# Patient Record
Sex: Female | Born: 1940
Health system: Southern US, Community
[De-identification: ages and names within clinical notes are randomized; demographics above are authoritative.]

## PROBLEM LIST (undated history)

## (undated) DIAGNOSIS — I87009 Postthrombotic syndrome without complications of unspecified extremity: Secondary | ICD-10-CM

## (undated) DIAGNOSIS — M75120 Complete rotator cuff tear or rupture of unspecified shoulder, not specified as traumatic: Secondary | ICD-10-CM

## (undated) DIAGNOSIS — I82409 Acute embolism and thrombosis of unspecified deep veins of unspecified lower extremity: Secondary | ICD-10-CM

## (undated) DIAGNOSIS — Z86718 Personal history of other venous thrombosis and embolism: Secondary | ICD-10-CM

## (undated) DIAGNOSIS — I447 Left bundle-branch block, unspecified: Secondary | ICD-10-CM

## (undated) DIAGNOSIS — M1990 Unspecified osteoarthritis, unspecified site: Secondary | ICD-10-CM

## (undated) DIAGNOSIS — I2510 Atherosclerotic heart disease of native coronary artery without angina pectoris: Secondary | ICD-10-CM

## (undated) DIAGNOSIS — I739 Peripheral vascular disease, unspecified: Secondary | ICD-10-CM

## (undated) DIAGNOSIS — I454 Nonspecific intraventricular block: Secondary | ICD-10-CM

## (undated) DIAGNOSIS — H3530 Unspecified macular degeneration: Secondary | ICD-10-CM

## (undated) DIAGNOSIS — E039 Hypothyroidism, unspecified: Secondary | ICD-10-CM

## (undated) DIAGNOSIS — I8390 Asymptomatic varicose veins of unspecified lower extremity: Secondary | ICD-10-CM

## (undated) DIAGNOSIS — I10 Essential (primary) hypertension: Secondary | ICD-10-CM

## (undated) DIAGNOSIS — J189 Pneumonia, unspecified organism: Secondary | ICD-10-CM

## (undated) DIAGNOSIS — Z8249 Family history of ischemic heart disease and other diseases of the circulatory system: Secondary | ICD-10-CM

## (undated) HISTORY — DX: Family history of ischemic heart disease and other diseases of the circulatory system: Z82.49

## (undated) HISTORY — DX: Asymptomatic varicose veins of unspecified lower extremity: I83.90

## (undated) HISTORY — DX: Acute embolism and thrombosis of unspecified deep veins of unspecified lower extremity: I82.409

## (undated) HISTORY — DX: Peripheral vascular disease, unspecified: I73.9

## (undated) HISTORY — DX: Atherosclerotic heart disease of native coronary artery without angina pectoris: I25.10

## (undated) HISTORY — DX: Unspecified macular degeneration: H35.30

## (undated) HISTORY — DX: Personal history of other venous thrombosis and embolism: Z86.718

## (undated) HISTORY — DX: Nonspecific intraventricular block: I45.4

## (undated) HISTORY — PX: BACK SURGERY: SHX140

---
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
## 1977-05-29 HISTORY — PX: PARTIAL HYSTERECTOMY: SHX80

## 1977-05-29 HISTORY — PX: ABDOMINAL HYSTERECTOMY: SHX81

## 1977-05-29 HISTORY — PX: APPENDECTOMY: SHX54

## 2002-01-03 ENCOUNTER — Encounter: Admission: RE | Admit: 2002-01-03 | Discharge: 2002-01-03 | Payer: Self-pay | Admitting: Family Medicine

## 2002-01-03 ENCOUNTER — Encounter: Payer: Self-pay | Admitting: Family Medicine

## 2003-12-04 ENCOUNTER — Encounter: Admission: RE | Admit: 2003-12-04 | Discharge: 2003-12-04 | Payer: Self-pay | Admitting: Family Medicine

## 2004-03-11 ENCOUNTER — Encounter: Admission: RE | Admit: 2004-03-11 | Discharge: 2004-03-11 | Payer: Self-pay | Admitting: Family Medicine

## 2005-09-14 ENCOUNTER — Encounter: Admission: RE | Admit: 2005-09-14 | Discharge: 2005-09-14 | Payer: Self-pay | Admitting: Family Medicine

## 2006-02-06 ENCOUNTER — Encounter: Admission: RE | Admit: 2006-02-06 | Discharge: 2006-02-06 | Payer: Self-pay | Admitting: Specialist

## 2006-12-14 ENCOUNTER — Encounter: Admission: RE | Admit: 2006-12-14 | Discharge: 2006-12-14 | Payer: Self-pay | Admitting: Family Medicine

## 2007-05-30 HISTORY — PX: BACK SURGERY: SHX140

## 2007-10-11 ENCOUNTER — Encounter: Admission: RE | Admit: 2007-10-11 | Discharge: 2007-10-11 | Payer: Self-pay | Admitting: Neurological Surgery

## 2007-12-16 ENCOUNTER — Encounter: Admission: RE | Admit: 2007-12-16 | Discharge: 2007-12-16 | Payer: Self-pay | Admitting: Neurological Surgery

## 2008-01-13 ENCOUNTER — Encounter: Admission: RE | Admit: 2008-01-13 | Discharge: 2008-01-13 | Payer: Self-pay | Admitting: Family Medicine

## 2008-01-16 ENCOUNTER — Inpatient Hospital Stay (HOSPITAL_COMMUNITY): Admission: RE | Admit: 2008-01-16 | Discharge: 2008-01-19 | Payer: Self-pay | Admitting: Neurological Surgery

## 2008-01-16 HISTORY — PX: OTHER SURGICAL HISTORY: SHX169

## 2008-02-18 ENCOUNTER — Encounter: Admission: RE | Admit: 2008-02-18 | Discharge: 2008-02-18 | Payer: Self-pay | Admitting: Neurological Surgery

## 2008-02-20 ENCOUNTER — Encounter: Admission: RE | Admit: 2008-02-20 | Discharge: 2008-03-18 | Payer: Self-pay | Admitting: Neurological Surgery

## 2008-04-20 ENCOUNTER — Encounter: Admission: RE | Admit: 2008-04-20 | Discharge: 2008-04-20 | Payer: Self-pay | Admitting: Neurological Surgery

## 2008-05-29 HISTORY — PX: OTHER SURGICAL HISTORY: SHX169

## 2008-06-29 ENCOUNTER — Encounter: Admission: RE | Admit: 2008-06-29 | Discharge: 2008-06-29 | Payer: Self-pay | Admitting: Neurological Surgery

## 2009-01-12 ENCOUNTER — Encounter: Admission: RE | Admit: 2009-01-12 | Discharge: 2009-01-12 | Payer: Self-pay | Admitting: Neurological Surgery

## 2009-01-26 ENCOUNTER — Encounter: Admission: RE | Admit: 2009-01-26 | Discharge: 2009-01-26 | Payer: Self-pay | Admitting: Family Medicine

## 2009-06-22 ENCOUNTER — Encounter: Admission: RE | Admit: 2009-06-22 | Discharge: 2009-06-22 | Payer: Self-pay | Admitting: Neurological Surgery

## 2009-07-16 ENCOUNTER — Inpatient Hospital Stay (HOSPITAL_COMMUNITY): Admission: RE | Admit: 2009-07-16 | Discharge: 2009-07-18 | Payer: Self-pay | Admitting: Neurological Surgery

## 2009-08-17 ENCOUNTER — Encounter: Admission: RE | Admit: 2009-08-17 | Discharge: 2009-08-17 | Payer: Self-pay | Admitting: Neurological Surgery

## 2009-09-14 ENCOUNTER — Encounter: Admission: RE | Admit: 2009-09-14 | Discharge: 2009-09-14 | Payer: Self-pay | Admitting: Neurological Surgery

## 2009-12-13 ENCOUNTER — Encounter: Admission: RE | Admit: 2009-12-13 | Discharge: 2009-12-13 | Payer: Self-pay | Admitting: Neurological Surgery

## 2010-03-08 ENCOUNTER — Encounter: Admission: RE | Admit: 2010-03-08 | Discharge: 2010-03-08 | Payer: Self-pay | Admitting: Family Medicine

## 2010-05-28 IMAGING — CR DG CHEST 2V
2 series · 2 of 2 positions shown · non-contrast
Comparison: None

CLINICAL DATA: Preoperative respiratory exam for spine surgery.
Hypertension.

CHEST - 2 VIEW

[view not recorded (1 of 2)]
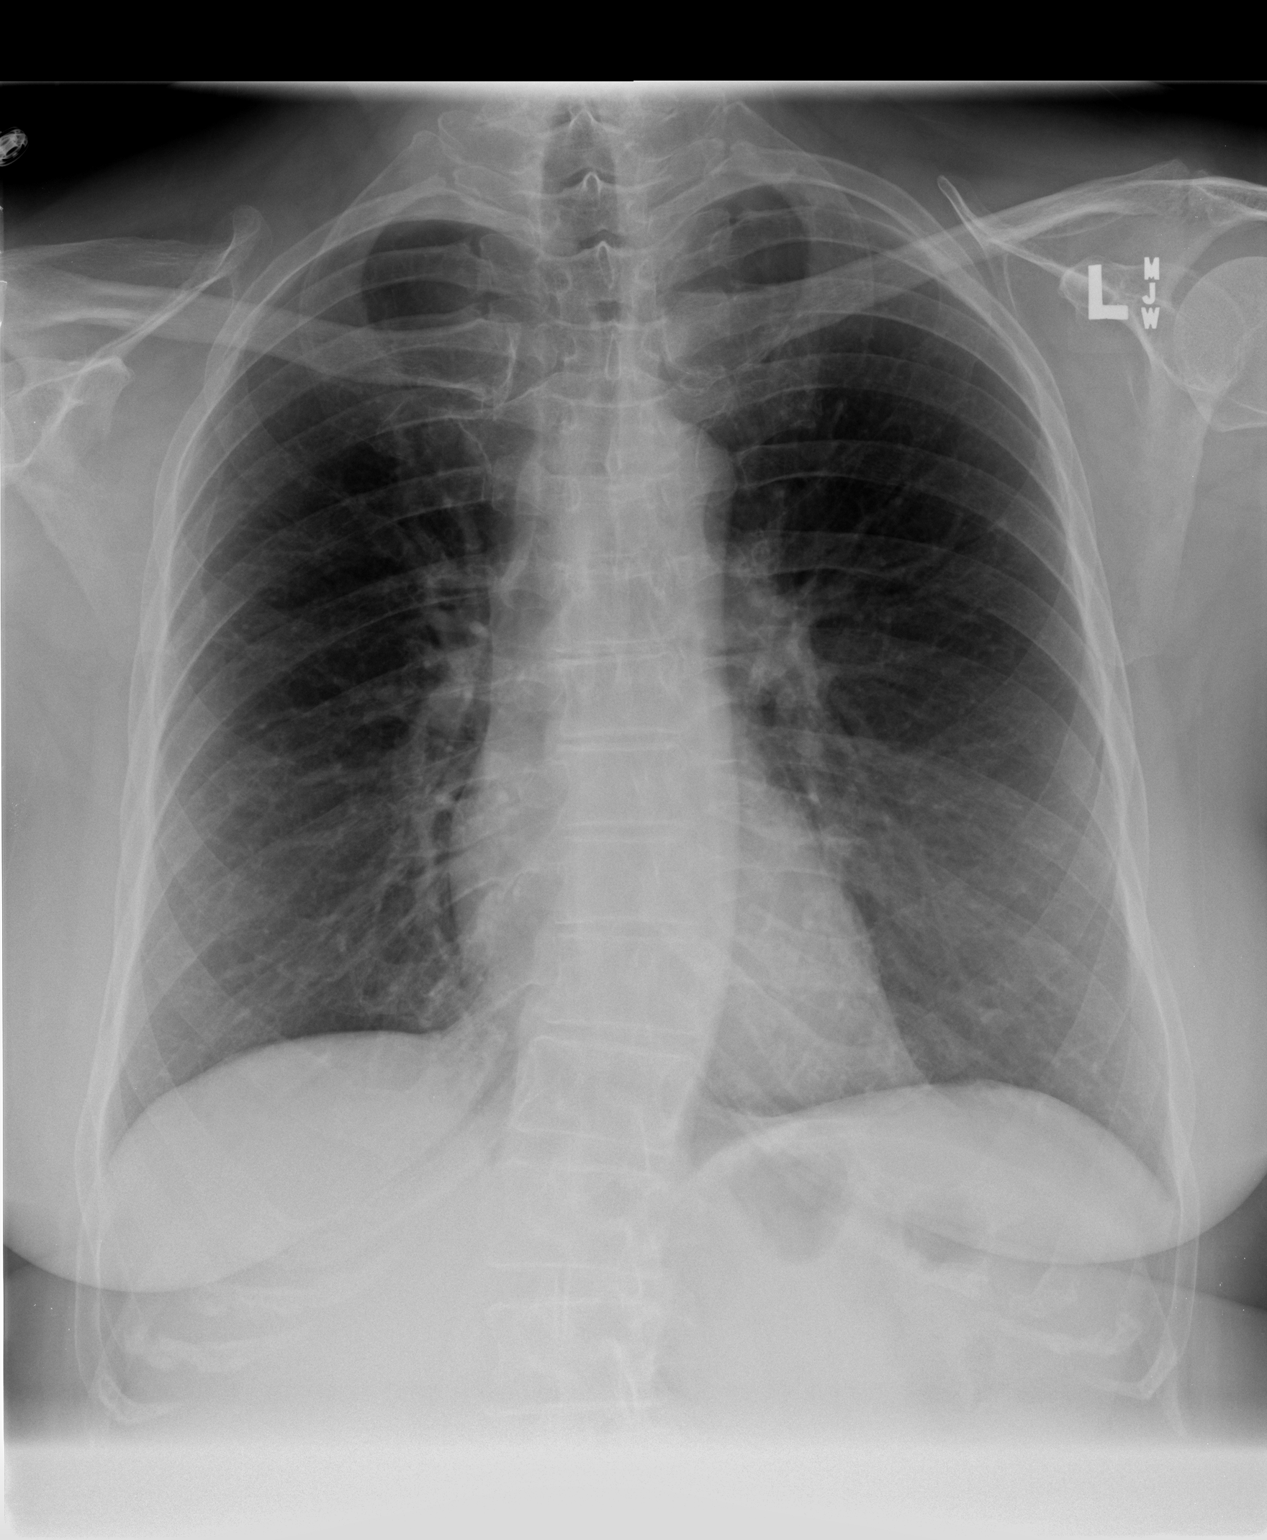

[view not recorded (2 of 2)]
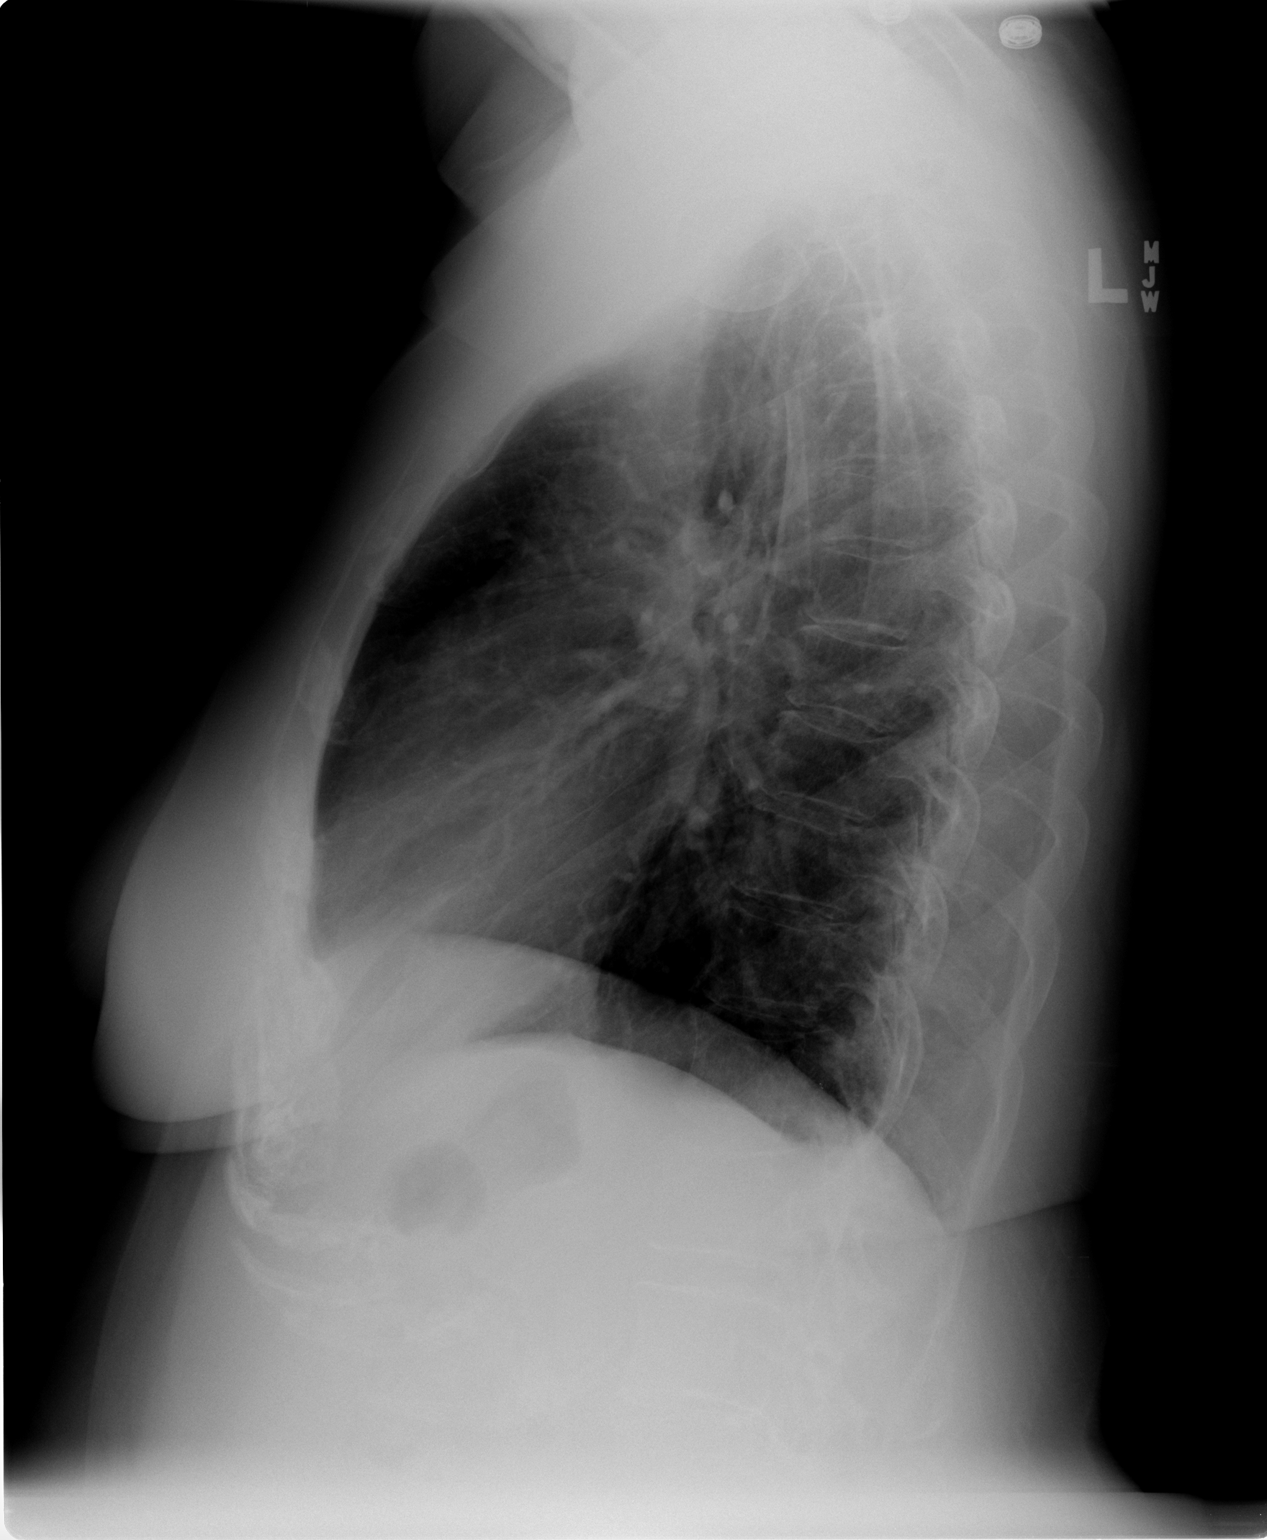

[2 of 2 positions shown; findings below may reference images not displayed]

FINDINGS: Heart size is normal.  The mediastinum is unremarkable.
Lungs are clear.  No effusions.  Mild scoliosis of the spine.
IMPRESSION: No active disease

## 2010-06-07 ENCOUNTER — Encounter
Admission: RE | Admit: 2010-06-07 | Discharge: 2010-06-07 | Payer: Self-pay | Source: Home / Self Care | Attending: Neurological Surgery | Admitting: Neurological Surgery

## 2010-08-17 LAB — CBC
HCT: 40.6 % (ref 36.0–46.0)
Hemoglobin: 14.3 g/dL (ref 12.0–15.0)
MCHC: 35.2 g/dL (ref 30.0–36.0)
MCV: 94.4 fL (ref 78.0–100.0)
Platelets: 213 10*3/uL (ref 150–400)
RBC: 4.3 MIL/uL (ref 3.87–5.11)
RDW: 12.6 % (ref 11.5–15.5)
WBC: 8.2 10*3/uL (ref 4.0–10.5)

## 2010-08-17 LAB — DIFFERENTIAL
Basophils Absolute: 0 10*3/uL (ref 0.0–0.1)
Basophils Relative: 0 % (ref 0–1)
Eosinophils Absolute: 0.1 10*3/uL (ref 0.0–0.7)
Eosinophils Relative: 1 % (ref 0–5)
Lymphocytes Relative: 27 % (ref 12–46)
Lymphs Abs: 2.2 10*3/uL (ref 0.7–4.0)
Monocytes Absolute: 0.8 10*3/uL (ref 0.1–1.0)
Monocytes Relative: 9 % (ref 3–12)
Neutro Abs: 5.1 10*3/uL (ref 1.7–7.7)
Neutrophils Relative %: 63 % (ref 43–77)

## 2010-08-17 LAB — BASIC METABOLIC PANEL
BUN: 12 mg/dL (ref 6–23)
CO2: 30 mEq/L (ref 19–32)
Calcium: 9.7 mg/dL (ref 8.4–10.5)
Chloride: 93 mEq/L — ABNORMAL LOW (ref 96–112)
Creatinine, Ser: 0.63 mg/dL (ref 0.4–1.2)
GFR calc Af Amer: >60 mL/min (ref >60)
GFR calc non Af Amer: >60 mL/min (ref >60)
Glucose, Bld: 86 mg/dL (ref 70–99)
Potassium: 4.1 mEq/L (ref 3.5–5.1)
Sodium: 131 mEq/L — ABNORMAL LOW (ref 135–145)

## 2010-08-17 LAB — PROTIME-INR
INR: 1.13 (ref 0.00–1.49)
Prothrombin Time: 14.4 seconds (ref 11.6–15.2)

## 2010-08-17 LAB — APTT: aPTT: 35 seconds (ref 24–37)

## 2010-08-17 LAB — TYPE AND SCREEN
ABO/RH(D): O POS
Antibody Screen: NEGATIVE

## 2010-08-17 LAB — SURGICAL PCR SCREEN
MRSA, PCR: NEGATIVE
Staphylococcus aureus: NEGATIVE

## 2010-10-11 NOTE — Discharge Summary (Signed)
[name], [name] NO.:  [id number]   MEDICAL RECORD NO.:  [id number]          PATIENT TYPE:  INP   LOCATION:  3027                         FACILITY:  [facility]   PHYSICIAN:  Tia Alert, MD     DATE OF BIRTH:  03-01-1941   DATE OF ADMISSION:  01/16/2008  DATE OF DISCHARGE:  01/19/2008                               DISCHARGE SUMMARY   ADMITTING DIAGNOSES:  Lumbar spinal stenosis with degenerative disease  and lumbar instability, L3-L4, L4-L5 with back and leg pain.   PROCEDURE:  Posterior lumbar interbody fusion, L3-L4, L4-L5.   HISTORY OF PRESENT ILLNESS:  Amber [name] is a 70 year old female who  presented with severe leg pain, especially in the right leg.  She had an  MRI, which showed segmental instability at L3-L4 and L4-L5 with facet  arthropathy and degenerative disease and spinal stenosis and recommended  two-level lumbar fusion at L3-L4 and L4-L5.  She understood the risks,  benefits, expected outcome, and wished to proceed.   HOSPITAL COURSE:  The patient was admitted on January 18, 2008 and taken  to the operating room where she underwent a posterior lumbar interbody  fusion at L3-L4 and L4-L5.  The patient tolerated the procedure well and  was taken to the recovery room and then to the floor in stable  condition.  For details of the operative procedure, please see the  dictated operative note.  The patient's hospital course was routine.  There were no complications.  She remained at bedrest the first night  with a Foley catheter in place and PCA for pain control.  She the next  morning was allowed out of bed.  Her Foley catheter was discontinued.  Her pain became well-controlled on oral pain medications and her PCA was  discontinued.  Her incision remained clean, dry, and intact.  She  remained afebrile with stable vital signs.  She was ambulating without  difficulty with a walker.  Physical and Occupational Therapy worked with  the patient.  She made  nice strides with them.  She was discharged home  in stable condition on January 19, 2008 with plans to follow up in a week  and half with me.   FINAL DIAGNOSIS:  Posterior lumbar interbody fusion, L3-L4, L4-L5.      Tia Alert, MD  Electronically Signed     DSJ/MEDQ  D:  01/19/2008  T:  01/19/2008  Job:  [id number]

## 2010-10-11 NOTE — Op Note (Signed)
Amber [name], Amber [name] NO.:  [id number]   MEDICAL RECORD NO.:  [id number]          PATIENT TYPE:  INP   LOCATION:  3027                         FACILITY:  [facility]   PHYSICIAN:  Tia Alert, MD     DATE OF BIRTH:  [birth year]   DATE OF PROCEDURE:  01/16/2008  DATE OF DISCHARGE:                               OPERATIVE REPORT   PREOPERATIVE DIAGNOSES:  1. Degenerative disease L3-L4 and L4-L5.  2. Spondylolisthesis L3-L4.  3. Lumbar spinal stenosis L3-L4 and L4-L5.  4. Degenerative scoliosis.  5. Back pain.  6. Lower extremity radiculopathy.   POSTOPERATIVE DIAGNOSES:  1. Degenerative disease L3-L4 and L4-L5.  2. Spondylolisthesis L3-L4.  3. Lumbar spinal stenosis L3-L4 and L4-L5.  4. Degenerative scoliosis.  5. Back pain.  6. Lower extremity radiculopathy.   PROCEDURE:  1. Decompressive lumbar laminectomy, hemi-facetectomies, and      foraminotomies L3, L4, and L5; and decompression at the L3, L4, and      L5 nerve roots requiring more work than it has typically required      at the typical PLIF procedure.  2. Posterior lumbar interbody fusion L3-L4 and L4-L5 utilizing 8 x 22-      mm PEEK interbody cage packed with local autograft, Actifuse putty      and 8 x 22-mm tangent interbody bone wedges.  3. Intertransverse arthrodesis L3-L5 bilaterally utilizing locally      harvested morselized autologous bone graft and Actifuse putty.  4. Segmental fixation L3-L5 bilaterally utilizing the Sofamor-Danek      Legacy pedicle screw system.   SURGEON:  Tia Alert, MD   ASSISTANT:  Donalee Citrin, MD   ANESTHESIA:  General endotracheal.   COMPLICATIONS:  None apparent.   INDICATIONS FOR PROCEDURE:  Ms. Reber is a 70 year old female who has a  long history of back and right leg pain.  She had an MRI and then a CT  scan with a myelography, which showed significant spinal stenosis at L3-  L4 and L4-L5 with degenerative disk disease and degenerative disk  scoliosis.  She had severe back and right leg pain.  Because of her  segmental instability and scoliosis and significant stenosis, I  recommended a decompressive laminectomy followed by instrumented fusion.  She understood the risks, benefits, and expected outcome and wished to  proceed.   DESCRIPTION OF THE PROCEDURE:  The patient was taken to the operating  room.  After induction of adequate generalized endotracheal anesthesia,  she was rolled into the prone position on chest rolls and all pressure  points were padded.  Her lumbar region was prepped with DuraPrep and  then draped in the usual sterile fashion.  A 10 mL of local anesthesia  was injected, a dorsal midline incision was made and carried down to the  lumbosacral fascia.  The fascia was opened and the paraspinous  musculature was taken down in subperiosteal fashion to expose L3-L4 and  L4-L5, and intraoperative fluoroscopy confirmed my level and then the  dissection was taken out over the facets to expose the transverse  processes of  L3, L4, and L5.  I then removed the spinous processes and  the lamina with a combination of the Leksell rongeur and the Kerrison  punch of the underlying yellow ligament was removed in a piecemeal  fashion to expose the underlying L3, L4, and L5 nerve roots.  Hemi  facetectomies were performed and wide foraminotomies were performed  until each nerve root was skeletonized distally into the foramina.  Once  the decompression was complete, we turned our attention to the posterior  lumbar interbody fusion.  She had significant scoliosis and was  collapsed more on her right side than on the left.  We distracted the  disk spaces, opened to 8 mm at L3-L4 and L4-L5 with sequential  distraction and then prepared the endplates with the rotating cutter and  the cutting chisels.  The midline was prepared with Epstein curettes.  We, at each level placed 8 x 22-mm Peak interbody cages packed with  local  autograft and Actifuse and 8 x 22-mm tangent interbody bone  wedges.  The midline was packed with local autograft and Actifuse.  The  pedicle screw entry zones were then identified utilizing surface  landmarks and lateral fluoroscopy.  We probed each pedicle with the  pedicle probe, tapped each pedicle with a 5.5 tap, palpated each pedicle  with a ball probe to assure no break in the cortex and then placed a 65  x 45-mm pedicle screws into the L3 and L5 pedicles bilaterally and 65 x  40-mm pedicle screws in the L4 pedicles bilaterally.  We then  decorticated the transverse processes and placed a mixture of local  autograft and Actifuse out over these to perform intertransverse  arthrodesis L3-L4 and L4-L5 bilaterally.  We then placed lordotic rods  into multiaxial screw heads of the pedicle screws and locked these into  position with a locking caps and anti-torque device while achieving  compression of our grafts.  I then irrigated with saline solution  containing bacitracin, dried all bleeding points, and inspected nerve  roots once again by palpating along them with coronary dilator and then  lined the dura with Gelfoam and placed a medium Hemovac drain through  separate stab incision then closed the muscle and the fascia with 0  Vicryl, closed the subcutaneous and subcuticular tissue with 2-0 and 3-0  Vicryl, closed the skin with benzoin and Steri-Strips.  The drapes were  removed and sterile dressing was applied.  The patient was awakened from  general anesthesia and transferred to the recovery room in stable  condition.  At the end of the procedure, all sponge, needle, and  instrument counts were correct.      Tia Alert, MD  Electronically Signed     DSJ/MEDQ  D:  01/16/2008  T:  01/17/2008  Job:  [phone] [id number]

## 2011-03-22 ENCOUNTER — Other Ambulatory Visit: Payer: Self-pay | Admitting: Family Medicine

## 2011-03-22 DIAGNOSIS — Z1231 Encounter for screening mammogram for malignant neoplasm of breast: Secondary | ICD-10-CM

## 2011-04-13 ENCOUNTER — Ambulatory Visit
Admission: RE | Admit: 2011-04-13 | Discharge: 2011-04-13 | Disposition: A | Discharge status: Home / Self Care | Payer: Medicare Other | Source: Ambulatory Visit | Attending: Family Medicine | Admitting: Family Medicine

## 2011-04-13 DIAGNOSIS — Z1231 Encounter for screening mammogram for malignant neoplasm of breast: Secondary | ICD-10-CM

## 2011-05-03 ENCOUNTER — Other Ambulatory Visit: Payer: Self-pay | Admitting: Family Medicine

## 2011-05-03 DIAGNOSIS — IMO0002 Reserved for concepts with insufficient information to code with codable children: Secondary | ICD-10-CM

## 2011-05-04 ENCOUNTER — Ambulatory Visit
Admission: RE | Admit: 2011-05-04 | Discharge: 2011-05-04 | Disposition: A | Discharge status: Home / Self Care | Payer: Medicare Other | Source: Ambulatory Visit | Attending: Family Medicine | Admitting: Family Medicine

## 2011-05-04 DIAGNOSIS — IMO0002 Reserved for concepts with insufficient information to code with codable children: Secondary | ICD-10-CM

## 2011-05-04 MED ORDER — GADOBENATE DIMEGLUMINE 529 MG/ML IV SOLN
16.0000 mL | Freq: Once | INTRAVENOUS | Status: AC | PRN
  Administered 2011-05-04: 16 mL via INTRAVENOUS

## 2011-06-20 ENCOUNTER — Encounter (HOSPITAL_COMMUNITY): Payer: Self-pay | Admitting: Pharmacy Technician

## 2011-06-23 ENCOUNTER — Encounter (HOSPITAL_COMMUNITY)
Admission: RE | Admit: 2011-06-23 | Discharge: 2011-06-23 | Disposition: A | Discharge status: Home / Self Care | Payer: Medicare Other | Source: Ambulatory Visit | Attending: Orthopedic Surgery | Admitting: Orthopedic Surgery

## 2011-06-23 ENCOUNTER — Ambulatory Visit (HOSPITAL_COMMUNITY)
Admission: RE | Admit: 2011-06-23 | Discharge: 2011-06-23 | Disposition: A | Discharge status: Home / Self Care | Payer: Medicare Other | Source: Ambulatory Visit | Attending: Orthopedic Surgery | Admitting: Orthopedic Surgery

## 2011-06-23 ENCOUNTER — Encounter (HOSPITAL_COMMUNITY): Payer: Self-pay

## 2011-06-23 DIAGNOSIS — Z87891 Personal history of nicotine dependence: Secondary | ICD-10-CM | POA: Insufficient documentation

## 2011-06-23 DIAGNOSIS — Z01818 Encounter for other preprocedural examination: Secondary | ICD-10-CM | POA: Insufficient documentation

## 2011-06-23 DIAGNOSIS — Z01812 Encounter for preprocedural laboratory examination: Secondary | ICD-10-CM | POA: Insufficient documentation

## 2011-06-23 HISTORY — DX: Essential (primary) hypertension: I10

## 2011-06-23 HISTORY — DX: Unspecified osteoarthritis, unspecified site: M19.90

## 2011-06-23 HISTORY — DX: Hypothyroidism, unspecified: E03.9

## 2011-06-23 LAB — SURGICAL PCR SCREEN
MRSA, PCR: NEGATIVE
Staphylococcus aureus: NEGATIVE

## 2011-06-23 LAB — CBC
HCT: 40.5 % (ref 36.0–46.0)
Hemoglobin: 13.9 g/dL (ref 12.0–15.0)
MCH: 31.6 pg (ref 26.0–34.0)
MCHC: 34.3 g/dL (ref 30.0–36.0)
MCV: 92 fL (ref 78.0–100.0)
Platelets: 197 10*3/uL (ref 150–400)
RBC: 4.4 MIL/uL (ref 3.87–5.11)
RDW: 13.2 % (ref 11.5–15.5)
WBC: 8 10*3/uL (ref 4.0–10.5)

## 2011-06-23 LAB — COMPREHENSIVE METABOLIC PANEL
ALT: 23 U/L (ref 0–35)
AST: 25 U/L (ref 0–37)
Albumin: 3.8 g/dL (ref 3.5–5.2)
Alkaline Phosphatase: 68 U/L (ref 39–117)
BUN: 12 mg/dL (ref 6–23)
CO2: 32 mEq/L (ref 19–32)
Calcium: 9.8 mg/dL (ref 8.4–10.5)
Chloride: 97 mEq/L (ref 96–112)
Creatinine, Ser: 0.75 mg/dL (ref 0.50–1.10)
GFR calc Af Amer: >90 mL/min (ref >90)
GFR calc non Af Amer: 83 mL/min — ABNORMAL LOW (ref >90)
Glucose, Bld: 175 mg/dL — ABNORMAL HIGH (ref 70–99)
Potassium: 3.8 mEq/L (ref 3.5–5.1)
Sodium: 137 mEq/L (ref 135–145)
Total Bilirubin: 0.7 mg/dL (ref 0.3–1.2)
Total Protein: 6.9 g/dL (ref 6.0–8.3)

## 2011-06-23 LAB — URINE MICROSCOPIC-ADD ON

## 2011-06-23 LAB — URINALYSIS, ROUTINE W REFLEX MICROSCOPIC
Bilirubin Urine: NEGATIVE
Glucose, UA: NEGATIVE mg/dL
Hgb urine dipstick: NEGATIVE
Ketones, ur: NEGATIVE mg/dL
Nitrite: NEGATIVE
Protein, ur: NEGATIVE mg/dL
Specific Gravity, Urine: 1.009 (ref 1.005–1.030)
Urobilinogen, UA: 0.2 mg/dL (ref 0.0–1.0)
pH: 6.5 (ref 5.0–8.0)

## 2011-06-23 LAB — DIFFERENTIAL
Basophils Absolute: 0 10*3/uL (ref 0.0–0.1)
Basophils Relative: 0 % (ref 0–1)
Eosinophils Absolute: 0.1 10*3/uL (ref 0.0–0.7)
Eosinophils Relative: 1 % (ref 0–5)
Lymphocytes Relative: 33 % (ref 12–46)
Lymphs Abs: 2.7 10*3/uL (ref 0.7–4.0)
Monocytes Absolute: 0.4 10*3/uL (ref 0.1–1.0)
Monocytes Relative: 5 % (ref 3–12)
Neutro Abs: 4.8 10*3/uL (ref 1.7–7.7)
Neutrophils Relative %: 60 % (ref 43–77)

## 2011-06-23 LAB — PROTIME-INR
INR: 0.97 (ref 0.00–1.49)
Prothrombin Time: 13.1 seconds (ref 11.6–15.2)

## 2011-06-23 LAB — APTT: aPTT: 42 seconds — ABNORMAL HIGH (ref 24–37)

## 2011-06-23 NOTE — Patient Instructions (Signed)
[name]  06/23/2011   Your procedure is scheduled on:  Wednesday 07/05/2011 at 1120 am  Report to [facility] at 0920 AM.  Call this number if you have problems the morning of surgery: [phone]   Remember:   Do not eat food:After Midnight.  May have clear liquids:until Midnight .  Clear liquids include soda, tea, black coffee, apple or grape juice, broth.  Take these medicines the morning of surgery with A SIP OF WATER: Synthroid   Do not wear jewelry, make-up or nail polish.  Do not wear lotions, powders, or perfumes.   Do not shave 48 hours prior to surgery.(women only-shaving legs)  Do not bring valuables to the hospital.  Contacts, dentures or bridgework may not be worn into surgery.  Leave suitcase in the car. After surgery it may be brought to your room.  For patients admitted to the hospital, checkout time is 11:00 AM the day of discharge.   Patients discharged the day of surgery will not be allowed to drive home.  Name and phone number of your driver:  Special Instructions: CHG Shower Use Special Wash: 1/2 bottle night before surgery and 1/2 bottle morning of surgery.   Please read over the following fact sheets that you were given: MRSA Information

## 2011-06-23 NOTE — H&P (Signed)
Amber [name] DOB: May 03, 1941   Chief Complaint: Right shoulder pain  History of Present Illness The patient is a 71 year old female who comes in today for a preoperative History and Physical. The patient is scheduled for a right Rotator Cuff Repair to be performed by Dr. Georges Lynch. [name], MD at [facility] on Wednesday July 05, 2011 . She reports pain with overhead motions at the right shoulder and right biceps. The patient reports symptoms which include shoulder pain, shoulder stiffness, decreased range of motion and inability to lay on that side. The patient reports symptoms that radiate to the right upper arm. MRI reveals large right rotator cuff tear and ruptured long head of her biceps. PCP: Dr. Marjory Lies Back/Neurosurgeon: Dr. Marikay Alar  Past MedicalHistory Complete rotator cuff rupture, non-traumatic (727.61) Biceps tendon rupture (840.8) Degeneration, lumbar/lumbosacral disc (722.52). 11/08/2004 Lumbago (724.2). 12/21/2004 Osteoarthrosis NOS, other spec site (715.98). 09/28/2005 High blood pressure Hypothyroidism Hypercholesterolemia Herpes Zoster Varicose veins Phlebitis Hypothyroidism Blood clots. 1964 after birth of child Osteoarthritis Degenerative Disc Disease Bursitis. Right shoulder Measles. childhood Mumps. childhood Left Bundle Branch Block. ECG. stable  Allergies DARVOCET. 11/11/2004 Nausea. SULFA. 11/11/2004 Photosensitivity, Pruritus, Swelling. Nickel. Rash. Codeine/Codeine Derivatives. Vomiting.  Family History Congestive Heart Failure. mother Chronic Obstructive Lung Disease. sister Cancer. mother (esophageal), sister and brother Heart Disease. Father. mother, sister and brother Diabetes Mellitus. mother and sister Depression. sister Severe allergy. brother Liver Disease, Chronic. sister Hypertension. sister  Social History Pain Contract. no Current work status. retired Illicit drug use.  no Exercise. Exercises daily; does running / walking Drug/Alcohol Rehab (Currently). no Living situation. live with spouse Children. 2 Number of flights of stairs before winded. less than 1 Marital status. married Previously in rehab. no Tobacco / smoke exposure. no Alcohol use. never consumed alcohol Tobacco use. former smoker; smoke(d) less than 1/2 pack(s) per day Caregiver. after surgery- sister and husband  Medication History Levothyroxine Sodium ( Tablet, Oral) Active. Losartan Potassium-HCTZ (100-25MG  Tablet, Oral) Active. Simvastatin (20MG  Tablet, Oral) Active. Aspirin EC (81MG  Tablet DR, Oral daily) Active.  Past Surgical History Hysterectomy. partial (non-cancerous) 1979 Dilation and Curettage of Uterus - Multiple Spinal Decompression. lower back 2011 Spinal Surgery. Lumbar fusion L3-4, L4-5 2009 Tonsillectomy Appendectomy  Review of Systems General:Not Present- Chills, Fever, Night Sweats, Fatigue, Weight Gain, Weight Loss and Memory Loss. Skin:Not Present- Hives, Itching, Rash, Eczema and Lesions. HEENT:Not Present- Tinnitus, Headache, Double Vision, Visual Loss, Hearing Loss and Dentures. Respiratory:Not Present- Shortness of breath with exertion, Shortness of breath at rest, Allergies, Coughing up blood and Chronic Cough. Cardiovascular:Not Present- Chest Pain, Racing/skipping heartbeats, Difficulty Breathing Lying Down, Murmur, Swelling and Palpitations. Gastrointestinal:Not Present- Bloody Stool, Heartburn, Abdominal Pain, Vomiting, Nausea, Constipation, Diarrhea, Difficulty Swallowing, Jaundice and Loss of appetitie. Female Genitourinary:Present- Urinating at Night. Not Present- Blood in Urine, Urinary frequency, Weak urinary stream, Discharge, Flank Pain, Incontinence, Painful Urination, Urgency and Urinary Retention. Musculoskeletal:Present- Muscle Weakness, Joint Pain and Morning Stiffness. Not Present- Muscle Pain, Joint Swelling,  Back Pain and Spasms. Neurological:Not Present- Tremor, Dizziness, Blackout spells, Paralysis, Difficulty with balance and Weakness. Psychiatric:Not Present- Insomnia.  Vitals 06/23/2011 9:30 AM Weight: 174 lb Height: 64 in Body Surface Area: 1.89 m Body Mass Index: 29.87 kg/m Pulse: 72 (Regular) Resp.: 16 (Unlabored) BP: 128/70 (Sitting, Left Arm, Standard)  Physical Exam General Mental Status - Alert, cooperative and good historian. General Appearance- pleasant. Not in acute distress. Orientation- Oriented X3. Build & Nutrition- Well nourished and Well developed. Head and Neck Head- normocephalic,  atraumatic . NeckGlobal Assessment- supple. no bruit auscultated on the right and no bruit auscultated on the left Eye Pupil- Bilateral- PERRLA. Motion- Bilateral- EOMI. Chest and Lung Exam Auscultation: Breath sounds:- clear at anterior chest wall and - clear at posterior chest wall. Adventitious sounds:- No Adventitious sounds. Cardiovascular Auscultation:Rhythm- Regular rate and rhythm. Heart Sounds- S1 WNL and S2 WNL. Murmurs & Other Heart Sounds:Auscultation of the heart reveals - No Murmurs. Abdomen Palpation/Percussion:Tenderness- Abdomen is non-tender to palpation. Rigidity (guarding)- Abdomen is soft. Auscultation:Auscultation of the abdomen reveals - Bowel sounds normal. Female Genitourinary Not done, not pertinent to present illness Peripheral Vascular Upper Extremity: Palpation:- Pulses bilaterally normal. Lower Extremity: Palpation:- Pulses bilaterally normal. Neurologic Neurologic evaluation reveals - normal sensation and upper and lower extremity deep tendon reflexes intact bilaterally . Musculoskeletal She has pain and limited motion to the right shoulder. She obviously on physical exam has a complete disruption of the long head of the biceps. That is why her right biceps is larger now than what it was before. She  simply recoiled the biceps from losing the attachment of the biceps tendon. There are no masses or tumors about the shoulder nor the axillary region. No supraclavicular nodes. The shoulder is well located. She has marked weakness of the abductors against resistance of the right shoulder. She is able to get her arm above her head. Scapula is normal. Pain with internal and external rotation of the right shoulder.  Assessment & Plan Complete rotator cuff rupture, non-traumatic (727.61) Right rotator cuff repair with graft We may or may not need to use a graft material that is made from calf skin. This is approved by the FDA and I have not had a rejection of that graft yet. Also, we may need to use anchors. These are polyethylene anchors that stay in the bone and we use those anchors to suture the tendon down in certain cases where the tendon is completely pulled off the bone. There is always a chance of a secondary infection obviously with any surgery but we do use antibiotics preop.  Dimitri Ped, PA-C

## 2011-06-27 ENCOUNTER — Other Ambulatory Visit (HOSPITAL_COMMUNITY): Payer: Medicare Other

## 2011-07-05 ENCOUNTER — Ambulatory Visit (HOSPITAL_COMMUNITY): Payer: Medicare Other | Admitting: Certified Registered Nurse Anesthetist

## 2011-07-05 ENCOUNTER — Encounter (HOSPITAL_COMMUNITY)
Admission: RE | Disposition: A | Discharge status: Home / Self Care | Payer: Self-pay | Source: Ambulatory Visit | Attending: Orthopedic Surgery

## 2011-07-05 ENCOUNTER — Ambulatory Visit (HOSPITAL_COMMUNITY)
Admission: RE | Admit: 2011-07-05 | Discharge: 2011-07-07 | Disposition: A | Discharge status: Home / Self Care | Payer: Medicare Other | Source: Ambulatory Visit | Attending: Orthopedic Surgery | Admitting: Orthopedic Surgery

## 2011-07-05 ENCOUNTER — Encounter (HOSPITAL_COMMUNITY): Payer: Self-pay | Admitting: Certified Registered Nurse Anesthetist

## 2011-07-05 ENCOUNTER — Encounter (HOSPITAL_COMMUNITY): Payer: Self-pay | Admitting: *Deleted

## 2011-07-05 ENCOUNTER — Encounter (HOSPITAL_COMMUNITY): Payer: Self-pay

## 2011-07-05 DIAGNOSIS — E039 Hypothyroidism, unspecified: Secondary | ICD-10-CM | POA: Insufficient documentation

## 2011-07-05 DIAGNOSIS — M25819 Other specified joint disorders, unspecified shoulder: Secondary | ICD-10-CM | POA: Insufficient documentation

## 2011-07-05 DIAGNOSIS — Z79899 Other long term (current) drug therapy: Secondary | ICD-10-CM | POA: Insufficient documentation

## 2011-07-05 DIAGNOSIS — I10 Essential (primary) hypertension: Secondary | ICD-10-CM | POA: Insufficient documentation

## 2011-07-05 DIAGNOSIS — X58XXXA Exposure to other specified factors, initial encounter: Secondary | ICD-10-CM | POA: Insufficient documentation

## 2011-07-05 DIAGNOSIS — E7800 Pure hypercholesterolemia, unspecified: Secondary | ICD-10-CM | POA: Insufficient documentation

## 2011-07-05 DIAGNOSIS — S46819A Strain of other muscles, fascia and tendons at shoulder and upper arm level, unspecified arm, initial encounter: Secondary | ICD-10-CM | POA: Insufficient documentation

## 2011-07-05 DIAGNOSIS — S43429A Sprain of unspecified rotator cuff capsule, initial encounter: Secondary | ICD-10-CM | POA: Insufficient documentation

## 2011-07-05 DIAGNOSIS — S43499A Other sprain of unspecified shoulder joint, initial encounter: Secondary | ICD-10-CM | POA: Insufficient documentation

## 2011-07-05 DIAGNOSIS — M75120 Complete rotator cuff tear or rupture of unspecified shoulder, not specified as traumatic: Secondary | ICD-10-CM

## 2011-07-05 DIAGNOSIS — Z7982 Long term (current) use of aspirin: Secondary | ICD-10-CM | POA: Insufficient documentation

## 2011-07-05 DIAGNOSIS — M25519 Pain in unspecified shoulder: Secondary | ICD-10-CM | POA: Insufficient documentation

## 2011-07-05 HISTORY — DX: Complete rotator cuff tear or rupture of unspecified shoulder, not specified as traumatic: M75.120

## 2011-07-05 HISTORY — PX: SHOULDER OPEN ROTATOR CUFF REPAIR: SHX2407

## 2011-07-05 LAB — TYPE AND SCREEN
ABO/RH(D): O POS
Antibody Screen: NEGATIVE

## 2011-07-05 LAB — ABO/RH: ABO/RH(D): O POS

## 2011-07-05 SURGERY — REPAIR, ROTATOR CUFF, OPEN
Anesthesia: General | Site: Shoulder | Laterality: Right | Wound class: Clean

## 2011-07-05 MED ORDER — BISACODYL 10 MG RE SUPP: 10.0000 mg | Freq: Every day | RECTAL | Status: DC | PRN

## 2011-07-05 MED ORDER — GLYCOPYRROLATE 0.2 MG/ML IJ SOLN
INTRAMUSCULAR | Status: DC | PRN
  Administered 2011-07-05: .5 mg via INTRAVENOUS

## 2011-07-05 MED ORDER — FENTANYL CITRATE 0.05 MG/ML IJ SOLN
INTRAMUSCULAR | Status: DC | PRN
  Administered 2011-07-05: 50 ug via INTRAVENOUS
  Administered 2011-07-05: 100 ug via INTRAVENOUS
  Administered 2011-07-05 (×2): 50 ug via INTRAVENOUS

## 2011-07-05 MED ORDER — ONDANSETRON HCL 4 MG/2ML IJ SOLN
4.0000 mg | Freq: Four times a day (QID) | INTRAMUSCULAR | Status: DC | PRN
  Administered 2011-07-05: 4 mg via INTRAVENOUS
  Filled 2011-07-05 (×2): qty 2

## 2011-07-05 MED ORDER — MIDAZOLAM HCL 5 MG/5ML IJ SOLN
INTRAMUSCULAR | Status: DC | PRN
  Administered 2011-07-05: 2 mg via INTRAVENOUS

## 2011-07-05 MED ORDER — LACTATED RINGERS IV SOLN
INTRAVENOUS | Status: DC
  Administered 2011-07-05: 1000 mL via INTRAVENOUS

## 2011-07-05 MED ORDER — BACITRACIN-NEOMYCIN-POLYMYXIN 400-5-5000 EX OINT
TOPICAL_OINTMENT | CUTANEOUS | Status: DC | PRN
  Administered 2011-07-05: 1 via TOPICAL

## 2011-07-05 MED ORDER — LACTATED RINGERS IV SOLN
INTRAVENOUS | Status: DC
  Administered 2011-07-05 – 2011-07-07 (×4): via INTRAVENOUS

## 2011-07-05 MED ORDER — ACETAMINOPHEN 10 MG/ML IV SOLN
INTRAVENOUS | Status: DC | PRN
  Administered 2011-07-05: 1000 mg via INTRAVENOUS

## 2011-07-05 MED ORDER — BUPIVACAINE LIPOSOME 1.3 % IJ SUSP
INTRAMUSCULAR | Status: DC | PRN
  Administered 2011-07-05: 20 mL

## 2011-07-05 MED ORDER — VITAMIN B-12 1000 MCG PO TABS
1000.0000 ug | ORAL_TABLET | Freq: Every day | ORAL | Status: DC
  Administered 2011-07-05 – 2011-07-07 (×3): 1000 ug via ORAL
  Filled 2011-07-05 (×3): qty 1

## 2011-07-05 MED ORDER — POLYETHYLENE GLYCOL 3350 17 G PO PACK
17.0000 g | PACK | Freq: Every day | ORAL | Status: DC | PRN
  Filled 2011-07-05: qty 1

## 2011-07-05 MED ORDER — FLEET ENEMA 7-19 GM/118ML RE ENEM: 1.0000 | ENEMA | Freq: Once | RECTAL | Status: AC | PRN

## 2011-07-05 MED ORDER — SODIUM CHLORIDE 0.9 % IR SOLN
Status: DC | PRN
  Administered 2011-07-05: 12:00:00

## 2011-07-05 MED ORDER — ACETAMINOPHEN 650 MG RE SUPP: 650.0000 mg | Freq: Four times a day (QID) | RECTAL | Status: DC | PRN

## 2011-07-05 MED ORDER — LOSARTAN POTASSIUM-HCTZ 100-25 MG PO TABS: 1.0000 | ORAL_TABLET | Freq: Every day | ORAL | Status: DC

## 2011-07-05 MED ORDER — LOSARTAN POTASSIUM 50 MG PO TABS
100.0000 mg | ORAL_TABLET | Freq: Every day | ORAL | Status: DC
  Administered 2011-07-05 – 2011-07-06 (×2): 100 mg via ORAL
  Filled 2011-07-05 (×3): qty 2

## 2011-07-05 MED ORDER — SUCCINYLCHOLINE CHLORIDE 20 MG/ML IJ SOLN
INTRAMUSCULAR | Status: DC | PRN
  Administered 2011-07-05: 100 mg via INTRAVENOUS

## 2011-07-05 MED ORDER — PROPOFOL 10 MG/ML IV BOLUS
INTRAVENOUS | Status: DC | PRN
  Administered 2011-07-05: 150 mg via INTRAVENOUS

## 2011-07-05 MED ORDER — CEFAZOLIN SODIUM-DEXTROSE 2-3 GM-% IV SOLR
2.0000 g | INTRAVENOUS | Status: AC
  Administered 2011-07-05: 2 g via INTRAVENOUS

## 2011-07-05 MED ORDER — PROMETHAZINE HCL 25 MG/ML IJ SOLN: 6.2500 mg | INTRAMUSCULAR | Status: DC | PRN

## 2011-07-05 MED ORDER — PHENOL 1.4 % MT LIQD: 1.0000 | OROMUCOSAL | Status: DC | PRN

## 2011-07-05 MED ORDER — BUPIVACAINE LIPOSOME 1.3 % IJ SUSP
20.0000 mL | Freq: Once | INTRAMUSCULAR | Status: DC
  Filled 2011-07-05: qty 20

## 2011-07-05 MED ORDER — HYDROMORPHONE HCL 2 MG PO TABS
2.0000 mg | ORAL_TABLET | ORAL | Status: DC | PRN
  Administered 2011-07-06: 2 mg via ORAL
  Filled 2011-07-05: qty 1

## 2011-07-05 MED ORDER — ACETAMINOPHEN 325 MG PO TABS: 650.0000 mg | ORAL_TABLET | Freq: Four times a day (QID) | ORAL | Status: DC | PRN

## 2011-07-05 MED ORDER — NEOSTIGMINE METHYLSULFATE 1 MG/ML IJ SOLN
INTRAMUSCULAR | Status: DC | PRN
  Administered 2011-07-05: 25 mg via INTRAVENOUS

## 2011-07-05 MED ORDER — CEFAZOLIN SODIUM 1-5 GM-% IV SOLN: 1.0000 g | INTRAVENOUS | Status: DC

## 2011-07-05 MED ORDER — METHOCARBAMOL 100 MG/ML IJ SOLN
500.0000 mg | Freq: Four times a day (QID) | INTRAVENOUS | Status: DC | PRN
  Administered 2011-07-05 – 2011-07-06 (×2): 500 mg via INTRAVENOUS
  Filled 2011-07-05 (×2): qty 5

## 2011-07-05 MED ORDER — ASPIRIN EC 81 MG PO TBEC
81.0000 mg | DELAYED_RELEASE_TABLET | Freq: Every day | ORAL | Status: DC
  Administered 2011-07-06 – 2011-07-07 (×2): 81 mg via ORAL
  Filled 2011-07-05 (×2): qty 1

## 2011-07-05 MED ORDER — HYDROMORPHONE HCL PF 1 MG/ML IJ SOLN: 0.2500 mg | INTRAMUSCULAR | Status: DC | PRN

## 2011-07-05 MED ORDER — HYDROCHLOROTHIAZIDE 25 MG PO TABS
25.0000 mg | ORAL_TABLET | Freq: Every day | ORAL | Status: DC
  Administered 2011-07-05 – 2011-07-06 (×2): 25 mg via ORAL
  Filled 2011-07-05 (×3): qty 1

## 2011-07-05 MED ORDER — ONDANSETRON HCL 4 MG PO TABS
4.0000 mg | ORAL_TABLET | Freq: Four times a day (QID) | ORAL | Status: DC | PRN
  Administered 2011-07-07: 4 mg via ORAL
  Filled 2011-07-05: qty 1

## 2011-07-05 MED ORDER — SODIUM CHLORIDE 0.9 % IV SOLN
10.0000 mg | INTRAVENOUS | Status: DC | PRN
  Administered 2011-07-05: 10 ug/min via INTRAVENOUS

## 2011-07-05 MED ORDER — LIDOCAINE HCL (CARDIAC) 20 MG/ML IV SOLN
INTRAVENOUS | Status: DC | PRN
  Administered 2011-07-05: 50 mg via INTRAVENOUS

## 2011-07-05 MED ORDER — HYDROMORPHONE HCL PF 1 MG/ML IJ SOLN
0.5000 mg | INTRAMUSCULAR | Status: DC | PRN
  Administered 2011-07-05 – 2011-07-06 (×5): 1 mg via INTRAVENOUS
  Administered 2011-07-06: 0.5 mg via INTRAVENOUS
  Administered 2011-07-06 (×2): 1 mg via INTRAVENOUS
  Filled 2011-07-05 (×8): qty 1

## 2011-07-05 MED ORDER — LEVOTHYROXINE SODIUM 50 MCG PO TABS
50.0000 ug | ORAL_TABLET | Freq: Every day | ORAL | Status: DC
  Administered 2011-07-06 – 2011-07-07 (×2): 50 ug via ORAL
  Filled 2011-07-05 (×2): qty 1

## 2011-07-05 MED ORDER — MENTHOL 3 MG MT LOZG: 1.0000 | LOZENGE | OROMUCOSAL | Status: DC | PRN

## 2011-07-05 MED ORDER — SIMVASTATIN 20 MG PO TABS
20.0000 mg | ORAL_TABLET | Freq: Every day | ORAL | Status: DC
  Administered 2011-07-05 – 2011-07-06 (×2): 20 mg via ORAL
  Filled 2011-07-05 (×3): qty 1

## 2011-07-05 MED ORDER — ROCURONIUM BROMIDE 100 MG/10ML IV SOLN
INTRAVENOUS | Status: DC | PRN
  Administered 2011-07-05: 20 mg via INTRAVENOUS

## 2011-07-05 MED ORDER — METHOCARBAMOL 500 MG PO TABS: 500.0000 mg | ORAL_TABLET | Freq: Four times a day (QID) | ORAL | Status: DC | PRN

## 2011-07-05 SURGICAL SUPPLY — 46 items
BAG ZIPLOCK 12X15 (MISCELLANEOUS) ×2 IMPLANT
BLADE OSCILLATING/SAGITTAL (BLADE) ×1
BLADE SW THK.38XMED LNG THN (BLADE) ×1 IMPLANT
BNDG COHESIVE 6X5 TAN NS LF (GAUZE/BANDAGES/DRESSINGS) IMPLANT
BUR OVAL CARBIDE 4.0 (BURR) ×2 IMPLANT
CLEANER TIP ELECTROSURG 2X2 (MISCELLANEOUS) ×2 IMPLANT
CLOTH BEACON ORANGE TIMEOUT ST (SAFETY) ×2 IMPLANT
DRAPE POUCH INSTRU U-SHP 10X18 (DRAPES) ×2 IMPLANT
DRSG EMULSION OIL 3X16 NADH (GAUZE/BANDAGES/DRESSINGS) ×2 IMPLANT
DRSG EMULSION OIL 3X3 NADH (GAUZE/BANDAGES/DRESSINGS) ×2 IMPLANT
DRSG PAD ABDOMINAL 8X10 ST (GAUZE/BANDAGES/DRESSINGS) ×2 IMPLANT
DURAPREP 26ML APPLICATOR (WOUND CARE) ×2 IMPLANT
ELECT REM PT RETURN 9FT ADLT (ELECTROSURGICAL) ×2
ELECTRODE REM PT RTRN 9FT ADLT (ELECTROSURGICAL) ×1 IMPLANT
FLOSEAL 10ML (HEMOSTASIS) IMPLANT
GAUZE SPONGE 4X4 12PLY STRL LF (GAUZE/BANDAGES/DRESSINGS) ×2 IMPLANT
GLOVE BIO SURGEON STRL SZ 6.5 (GLOVE) ×2 IMPLANT
GLOVE BIOGEL PI IND STRL 8.5 (GLOVE) ×1 IMPLANT
GLOVE BIOGEL PI INDICATOR 8.5 (GLOVE) ×1
GLOVE ECLIPSE 8.0 STRL XLNG CF (GLOVE) ×2 IMPLANT
GOWN PREVENTION PLUS LG XLONG (DISPOSABLE) ×4 IMPLANT
GOWN STRL REIN XL XLG (GOWN DISPOSABLE) ×4 IMPLANT
KIT BASIN OR (CUSTOM PROCEDURE TRAY) ×2 IMPLANT
MANIFOLD NEPTUNE II (INSTRUMENTS) ×2 IMPLANT
NEEDLE MA TROC 1/2 (NEEDLE) IMPLANT
NS IRRIG 1000ML POUR BTL (IV SOLUTION) IMPLANT
PACK SHOULDER CUSTOM OPM052 (CUSTOM PROCEDURE TRAY) ×2 IMPLANT
PASSER SUT SWANSON 36MM LOOP (INSTRUMENTS) IMPLANT
PATCH TISSUE MEND 3X3CM (Orthopedic Implant) ×2 IMPLANT
POSITIONER SURGICAL ARM (MISCELLANEOUS) ×2 IMPLANT
SLING ARM IMMOBILIZER LRG (SOFTGOODS) ×2 IMPLANT
SPONGE SURGIFOAM ABS GEL 100 (HEMOSTASIS) IMPLANT
STAPLER VISISTAT 35W (STAPLE) ×2 IMPLANT
STRIP CLOSURE SKIN 1/2X4 (GAUZE/BANDAGES/DRESSINGS) ×2 IMPLANT
SUCTION FRAZIER 12FR DISP (SUCTIONS) ×2 IMPLANT
SUT BONE WAX W31G (SUTURE) ×2 IMPLANT
SUT ETHIBOND NAB CT1 #1 30IN (SUTURE) ×4 IMPLANT
SUT MNCRL AB 4-0 PS2 18 (SUTURE) ×2 IMPLANT
SUT VIC AB 0 CT1 27 (SUTURE) ×1
SUT VIC AB 0 CT1 27XBRD ANTBC (SUTURE) ×1 IMPLANT
SUT VIC AB 1 CT1 27 (SUTURE) ×2
SUT VIC AB 1 CT1 27XBRD ANTBC (SUTURE) ×2 IMPLANT
SUT VIC AB 2-0 CT1 27 (SUTURE)
SUT VIC AB 2-0 CT1 27XBRD (SUTURE) IMPLANT
TAPE HYPAFIX 4 X10 (GAUZE/BANDAGES/DRESSINGS) ×2 IMPLANT
TOWEL OR 17X26 10 PK STRL BLUE (TOWEL DISPOSABLE) ×4 IMPLANT

## 2011-07-05 NOTE — Transfer of Care (Signed)
Immediate Anesthesia Transfer of Care Note  Patient: Amber [name]  Procedure(s) Performed:  ROTATOR CUFF REPAIR SHOULDER OPEN  Patient Location: PACU  Anesthesia Type: General  Level of Consciousness: awake, alert  and oriented  Airway & Oxygen Therapy: Patient Spontanous Breathing and Patient connected to face mask oxygen  Post-op Assessment: Report given to PACU RN  Post vital signs: Reviewed  Complications: No apparent anesthesia complications

## 2011-07-05 NOTE — Interval H&P Note (Signed)
History and Physical Interval Note:  07/05/2011 11:04 AM  Amber [name]  has presented today for surgery, with the diagnosis of Right Shoulder Rotator Cuff Tear  The various methods of treatment have been discussed with the patient and family. After consideration of risks, benefits and other options for treatment, the patient has consented to  Procedure(s): ROTATOR CUFF REPAIR SHOULDER OPEN as a surgical intervention .  The patients' history has been reviewed, patient examined, no change in status, stable for surgery.  I have reviewed the patients' chart and labs.  Questions were answered to the patient's satisfaction.     Dimitra [place]stock A

## 2011-07-05 NOTE — Anesthesia Preprocedure Evaluation (Addendum)
Anesthesia Evaluation  Patient identified by MRN, date of birth, ID band Patient awake    Reviewed: Allergy & Precautions, H&P , NPO status , Patient's Chart, lab work & pertinent test results  Airway Mallampati: II TM Distance: >3 FB Neck ROM: full    Dental  (+) Caps and Dental Advisory Given,    Pulmonary neg pulmonary ROS,  clear to auscultation  Pulmonary exam normal       Cardiovascular Exercise Tolerance: Good hypertension, On Medications neg cardio ROS regular Normal    Neuro/Psych Negative Neurological ROS  Negative Psych ROS   GI/Hepatic negative GI ROS, Neg liver ROS,   Endo/Other  Negative Endocrine ROSHypothyroidism   Renal/GU negative Renal ROS  Genitourinary negative   Musculoskeletal   Abdominal   Peds  Hematology negative hematology ROS (+)   Anesthesia Other Findings   Reproductive/Obstetrics negative OB ROS                          Anesthesia Physical Anesthesia Plan  ASA: II  Anesthesia Plan: General   Post-op Pain Management:    Induction: Intravenous  Airway Management Planned: Oral ETT  Additional Equipment:   Intra-op Plan:   Post-operative Plan: Extubation in OR  Informed Consent: I have reviewed the patients History and Physical, chart, labs and discussed the procedure including the risks, benefits and alternatives for the proposed anesthesia with the patient or authorized representative who has indicated his/her understanding and acceptance.   Dental Advisory Given  Plan Discussed with: CRNA and Surgeon  Anesthesia Plan Comments:         Anesthesia Quick Evaluation

## 2011-07-05 NOTE — Anesthesia Procedure Notes (Signed)
Procedure Name: Intubation Date/Time: 07/05/2011 11:45 AM Performed by: Joycie Peek Pre-anesthesia Checklist: Patient identified, Timeout performed, Emergency Drugs available, Suction available and Patient being monitored Patient Re-evaluated:Patient Re-evaluated prior to inductionOxygen Delivery Method: Circle System Utilized Preoxygenation: Pre-oxygenation with 100% oxygen Intubation Type: IV induction Laryngoscope Size: Mac and 3 Grade View: Grade I Tube type: Oral Tube size: 8.0 mm Number of attempts: 1 Airway Equipment and Method: stylet Dental Injury: Teeth and Oropharynx as per pre-operative assessment

## 2011-07-05 NOTE — Anesthesia Postprocedure Evaluation (Signed)
  Anesthesia Post-op Note  Patient: Amber [name]  Procedure(s) Performed:  ROTATOR CUFF REPAIR SHOULDER OPEN  Patient Location: PACU  Anesthesia Type: General  Level of Consciousness: awake and alert   Airway and Oxygen Therapy: Patient Spontanous Breathing  Post-op Pain: mild  Post-op Assessment: Post-op Vital signs reviewed, Patient's Cardiovascular Status Stable, Respiratory Function Stable, Patent Airway and No signs of Nausea or vomiting  Post-op Vital Signs: stable  Complications: No apparent anesthesia complications

## 2011-07-05 NOTE — Brief Op Note (Signed)
07/05/2011  12:57 PM  PATIENT:  Amber [name]  71 y.o. female  PRE-OPERATIVE DIAGNOSIS:  Right Shoulder Rotator Cuff Tear  POST-OPERATIVE DIAGNOSIS:  Right Shoulder Rotator Cuff Tear  PROCEDURE:  Procedure(s): ROTATOR CUFF REPAIR SHOULDER OPEN  SURGEON:  Surgeon(s): Jacki Cones, MD  PHYSICIAN ASSISTANT:   ASSISTANTS: Dimitri Ped PA   ANESTHESIA:   general  EBL:     BLOOD ADMINISTERED:none  DRAINS: none   LOCAL MEDICATIONS USED:  BUPIVICAINE 20CC mixed with 10cc of Normal Saline,of which about 20cc was used.  SPECIMEN:  No Specimen  DISPOSITION OF SPECIMEN:  N/A  COUNTS:  YES  TOURNIQUET:  * No tourniquets in log *  DICTATION: .Other Dictation: Dictation Number [id number]  PLAN OF CARE: Admit for overnight observation  PATIENT DISPOSITION:  PACU - hemodynamically stable.

## 2011-07-06 MED ORDER — OXYCODONE-ACETAMINOPHEN 5-325 MG PO TABS
1.0000 | ORAL_TABLET | ORAL | Status: DC | PRN
  Administered 2011-07-06 – 2011-07-07 (×6): 2 via ORAL
  Filled 2011-07-06 (×6): qty 2

## 2011-07-06 NOTE — Op Note (Signed)
NAMEGEANIE, Amber [name] NO.:  [id number]  MEDICAL RECORD NO.:  [id number]  LOCATION:  1525                         FACILITY:  [facility]  PHYSICIAN:  Georges Lynch. Issabela Lesko, M.D.DATE OF BIRTH:  [birth year]  DATE OF PROCEDURE: DATE OF DISCHARGE:                              OPERATIVE REPORT   PREOPERATIVE DIAGNOSES: 1. Severe impingement syndrome, right shoulder. 2. Complete retracted tear, severe, complex. 3. Rotator cuff tendon, right shoulder.  POSTOPERATIVE DIAGNOSES: 1. Severe impingement syndrome, right shoulder. 2. Complete retracted tear, severe, complex. 3. Rotator cuff tendon, right shoulder.  OPERATION: 1. Open acromionectomy, right shoulder. 2. Repair of a complete complex multiply torn rotator cuff tendon tear     with retraction. 3. Tissue mend graft. 4. Two Stryker anchors.  PROCEDURE:  Under general anesthesia, the patient in a beach chair position, a routine orthopedic prep and drape of the right shoulder was carried out.  She had 2 g of IV Ancef preop.  Prior to any incisions, the appropriate time-out was carried out.  Prior to the surgery, I had marked the appropriate right arm in the holding area.  After this was done, an incision was made over the anterior aspect of the right shoulder.  Bleeder was identified and cauterized.  I identified the acromion.  I then partially stripped the deltoid tendon from the acromion and I split the small proximal portion of the deltoid muscle at this time.  Immediately upon going down into the shoulder, fluid expressed out through the shoulder, which was then taken to a complete disruption of the rotator cuff.  I first protected the underlying cuff by utilizing the Bennett retractor and I utilized the oscillating saw to do a partial acromionectomy.  I then irrigated the wound and bone waxed the undersurface of the acromion.  I then went down and tried to find the rotator cuff, it literally was in piece.  I went  back and forth posteriorly with the gloved finger.  Tried to free up as much tendon as I could, brought it forward with a clamp.  I went medially and laterally, first tried to do a cross suture, transverse suture and ASD suture to the proximal portion and then I brought the ends down as best I could, and went ahead and put 2 anchors in the proximal humerus, sutured the tendon down in place.  I first burred the lateral articular surface of the humerus to bleeding bone to see we get this tendon intact.  Following that, I then inserted a tissue mend graft, and anchored that down in place for reinforcement.  I thoroughly irrigated out the area, closed the wound in layers in the usual fashion.  The patient will be treated in a shoulder immobilizer.  Exparel 20 mL with a 10 mL mixture of saline was mixed and I injected about a total of 20 mL of that into the soft tissue structures.          ______________________________ Georges Lynch [name], M.D.     RAG/MEDQ  D:  07/05/2011  T:  07/06/2011  Job:  [id number]

## 2011-07-06 NOTE — Progress Notes (Signed)
Subjective: Ms. Duggin had a rough night last night. She states that she did not get much sleep due to pain and severe nausea. She reports that she has vomited multiple times. She is finding it difficult to keep food down. She states that she is getting moderate pain control with her pain meds. She denies shortness of breath and chest pain.   Objective: Vital signs in last 24 hours: Temp:  [96.8 F (36 C)-99 F (37.2 C)] 99 F (37.2 C) (02/07 0605) Pulse Rate:  [64-89] 89  (02/07 0605) Resp:  [8-20] 18  (02/07 0605) BP: (147-169)/(59-85) 152/69 mmHg (02/07 0605) SpO2:  [94 %-100 %] 96 % (02/07 0605) Weight:  [82.146 kg (181 lb 1.6 oz)] 82.146 kg (181 lb 1.6 oz) (02/06 1446)  Intake/Output from previous day: 02/06 0701 - 02/07 0700 In: 3110 [P.O.:240; I.V.:2820; IV Piggyback:50] Out: 375 [Urine:375]   Neurologically intact ABD soft Neurovascular intact Dorsiflexion/Plantar flexion intact Incision: dressing C/D/I No cellulitis present  Assessment/Plan: S/P open rotator cuff repair, right Possible discharge this evening if patient nausea and vomiting improves. Change dressing as needed. Continue Zofran.  Hopeful for discharge tomorrow if feeling better.   Daxtin Leiker LAUREN 07/06/2011, 8:37 AM

## 2011-07-06 NOTE — Progress Notes (Signed)
OT Note:  Have attempted eval 3x today.  Pt had a rough night and does not feel up to it.  Will reattempt tomorrow.  [place], Marissa [phone] 07/06/2011

## 2011-07-07 MED ORDER — METHOCARBAMOL 500 MG PO TABS: 500.0000 mg | ORAL_TABLET | Freq: Four times a day (QID) | ORAL | Status: AC | PRN

## 2011-07-07 MED ORDER — ONDANSETRON HCL 4 MG PO TABS: 4.0000 mg | ORAL_TABLET | Freq: Four times a day (QID) | ORAL | Status: AC | PRN

## 2011-07-07 MED ORDER — OXYCODONE-ACETAMINOPHEN 5-325 MG PO TABS: 1.0000 | ORAL_TABLET | ORAL | Status: AC | PRN

## 2011-07-07 NOTE — Discharge Summary (Signed)
Physician Discharge Summary   Patient ID: Amber [name] MRN: [id number] DOB/AGE: 1941/01/31 71 y.o.  Admit date: 07/05/2011 Discharge date: 07/07/2011  Primary Diagnosis: Complete tear, right rotator cuff  Admission Diagnoses: Past Medical History  Diagnosis Date  . Hypothyroidism   . Hypertension   . Arthritis     Discharge Diagnoses:  Active Problems:  Rotator cuff rupture, complete S/P right rotator cuff repair  Procedure: Procedure(s) (LRB): ROTATOR CUFF REPAIR SHOULDER OPEN (Right)   Consults: None  HPI: Amber [name] reported pain with overhead motions at the right shoulder and right biceps. The patient reported symptoms which include shoulder pain, shoulder stiffness, decreased range of motion and inability to lay on that side. The patient reported symptoms that radiate to the right upper arm. MRI reveals large right rotator cuff tear and ruptured long head of her biceps. Dr. [name] performed an open right rotator cuff repair    Laboratory Data: Hospital Outpatient Visit on 06/23/2011  Component Date Value Range Status  . aPTT (seconds) 06/23/2011 42* 24-37 Final   Comment:                                 IF BASELINE aPTT IS ELEVATED,                          SUGGEST PATIENT RISK ASSESSMENT                          BE USED TO DETERMINE APPROPRIATE                          ANTICOAGULANT THERAPY.  . WBC (K/uL) 06/23/2011 8.0  4.0-10.5 Final  . RBC (MIL/uL) 06/23/2011 4.40  3.87-5.11 Final  . Hemoglobin (g/dL) 40/98/1191 47.8  29.5-62.1 Final  . HCT (%) 06/23/2011 40.5  36.0-46.0 Final  . MCV (fL) 06/23/2011 92.0  78.0-100.0 Final  . MCH (pg) 06/23/2011 31.6  26.0-34.0 Final  . MCHC (g/dL) 30/86/5784 69.6  29.5-28.4 Final  . RDW (%) 06/23/2011 13.2  11.5-15.5 Final  . Platelets (K/uL) 06/23/2011 197  150-400 Final  . Sodium (mEq/L) 06/23/2011 137  135-145 Final  . Potassium (mEq/L) 06/23/2011 3.8  3.5-5.1 Final  . Chloride (mEq/L) 06/23/2011 97  96-112 Final  . CO2  (mEq/L) 06/23/2011 32  19-32 Final  . Glucose, Bld (mg/dL) 13/24/4010 272* 53-66 Final  . BUN (mg/dL) 44/07/4740 12  5-95 Final  . Creatinine, Ser (mg/dL) 63/87/5643 3.29  5.18-8.41 Final  . Calcium (mg/dL) 66/10/3014 9.8  0.1-09.3 Final  . Total Protein (g/dL) 23/55/7322 6.9  0.2-5.4 Final  . Albumin (g/dL) 27/10/2374 3.8  2.8-3.1 Final  . AST (U/L) 06/23/2011 25  0-37 Final  . ALT (U/L) 06/23/2011 23  0-35 Final  . Alkaline Phosphatase (U/L) 06/23/2011 68  39-117 Final  . Total Bilirubin (mg/dL) 51/76/1607 0.7  3.7-1.0 Final  . GFR calc non Af Amer (mL/min) 06/23/2011 83* >90 Final  . GFR calc Af Amer (mL/min) 06/23/2011 >90  >90 Final   Comment:                                 The eGFR has been calculated  using the CKD EPI equation.                          This calculation has not been                          validated in all clinical                          situations.                          eGFR's persistently                          <90 mL/min signify                          possible Chronic Kidney Disease.  Marland Kitchen Neutrophils Relative (%) 06/23/2011 60  43-77 Final  . Neutro Abs (K/uL) 06/23/2011 4.8  1.7-7.7 Final  . Lymphocytes Relative (%) 06/23/2011 33  12-46 Final  . Lymphs Abs (K/uL) 06/23/2011 2.7  0.7-4.0 Final  . Monocytes Relative (%) 06/23/2011 5  3-12 Final  . Monocytes Absolute (K/uL) 06/23/2011 0.4  0.1-1.0 Final  . Eosinophils Relative (%) 06/23/2011 1  0-5 Final  . Eosinophils Absolute (K/uL) 06/23/2011 0.1  0.0-0.7 Final  . Basophils Relative (%) 06/23/2011 0  0-1 Final  . Basophils Absolute (K/uL) 06/23/2011 0.0  0.0-0.1 Final  . Prothrombin Time (seconds) 06/23/2011 13.1  11.6-15.2 Final  . INR  06/23/2011 0.97  0.00-1.49 Final  . Color, Urine  06/23/2011 YELLOW  YELLOW Final  . APPearance  06/23/2011 CLEAR  CLEAR Final  . Specific Gravity, Urine  06/23/2011 1.009  1.005-1.030 Final  . pH  06/23/2011 6.5  5.0-8.0 Final  .  Glucose, UA (mg/dL) 16/02/9603 NEGATIVE  NEGATIVE Final  . Hgb urine dipstick  06/23/2011 NEGATIVE  NEGATIVE Final  . Bilirubin Urine  06/23/2011 NEGATIVE  NEGATIVE Final  . Ketones, ur (mg/dL) 54/01/8118 NEGATIVE  NEGATIVE Final  . Protein, ur (mg/dL) 14/78/2956 NEGATIVE  NEGATIVE Final  . Urobilinogen, UA (mg/dL) 21/30/8657 0.2  8.4-6.9 Final  . Nitrite  06/23/2011 NEGATIVE  NEGATIVE Final  . Leukocytes, UA  06/23/2011 TRACE* NEGATIVE Final  . MRSA, PCR  06/23/2011 NEGATIVE  NEGATIVE Final  . Staphylococcus aureus  06/23/2011 NEGATIVE  NEGATIVE Final   Comment:                                 The Xpert SA Assay (FDA                          approved for NASAL specimens                          only), is one component of                          a comprehensive surveillance                          program.  It is not intended  to diagnose infection nor to                          guide or monitor treatment.  . Squamous Epithelial / LPF  06/23/2011 FEW* RARE Final  . WBC, UA (WBC/hpf) 06/23/2011 3-6  <3 Final  . Bacteria, UA  06/23/2011 FEW* RARE Final  . Daryll Drown  06/23/2011 MUCOUS PRESENT   Final   No results found for this basename: HGB:5 in the last 72 hours No results found for this basename: WBC:2,RBC:2,HCT:2,PLT:2 in the last 72 hours No results found for this basename: NA:2,K:2,CL:2,CO2:2,BUN:2,CREATININE:2,GLUCOSE:2,CALCIUM:2 in the last 72 hours No results found for this basename: LABPT:2,INR:2 in the last 72 hours  X-Rays:Dg Chest 2 View  06/23/2011  *RADIOLOGY REPORT*  Clinical Data: Preoperative assessment for right rotator cuff repair, former smoker  CHEST - 2 VIEW  Comparison: 07/14/2009  Findings: Normal heart size, mediastinal contours, and pulmonary vascularity. Lungs clear. No pleural effusion or pneumothorax. Bones appear demineralized. Biconvex thoracolumbar scoliosis. Prior lumbar fixation.  IMPRESSION: No acute abnormalities.  Original  Report Authenticated By: Lollie Marrow, M.D.    EKG:No orders found for this or any previous visit.   Hospital Course: Patient was admitted to [facility] and taken to the OR and underwent the above state procedure without complications.  Patient tolerated the procedure well and was later transferred to the recovery room and then to the orthopaedic floor for postoperative care.  They were given PO and IV analgesics for pain control following their surgery.  PT and OT were ordered for total joint protocol.  Discharge planning consulted to help with postop disposition and equipment needs.  Patient had a bad night on the evening of surgery. She had severe nausea and vomiting. Changed pain control meds from dilaudid to percocet. Patient noted improvement in nausea. Dressing was changed on day one and the incision was clean, dry, no drainage or erythema.  By day two, the patient's nausea had improved and the incision was healing well.  Patient was seen in rounds and was ready to go home.    Discharge Medications: Prior to Admission medications   Medication Sig Start Date End Date Taking? Authorizing Provider  aspirin EC 81 MG tablet Take 81 mg by mouth daily after breakfast.   Yes Historical Provider, MD  Calcium Citrate-Vitamin D 500-400 MG-UNIT CHEW Chew 1 tablet by mouth at bedtime and may repeat dose one time if needed.   Yes Historical Provider, MD  levothyroxine (SYNTHROID, LEVOTHROID) 50 MCG tablet Take 50 mcg by mouth daily before breakfast.   Yes Historical Provider, MD  losartan-hydrochlorothiazide (HYZAAR) 100-25 MG per tablet Take 1 tablet by mouth at bedtime.   Yes Historical Provider, MD  Multiple Vitamin (MULITIVITAMIN WITH MINERALS) TABS Take 1 tablet by mouth daily.   Yes Historical Provider, MD  pyridOXINE (VITAMIN B-6) 100 MG tablet Take 100 mg by mouth daily.   Yes Historical Provider, MD  simvastatin (ZOCOR) 20 MG tablet Take 20 mg by mouth at bedtime.   Yes Historical  Provider, MD  vitamin B-12 (CYANOCOBALAMIN) 1000 MCG tablet Take 1,000 mcg by mouth daily.   Yes Historical Provider, MD  vitamin C (ASCORBIC ACID) 500 MG tablet Take 500 mg by mouth 2 (two) times daily.   Yes Historical Provider, MD  VITAMIN E PO Take 1 capsule by mouth daily.   Yes Historical Provider, MD  methocarbamol (ROBAXIN) 500 MG tablet Take 1 tablet (500 mg total)  by mouth every 6 (six) hours as needed. 07/07/11 07/17/11  Arriyanna Mersch Tamala Ser, PA  ondansetron (ZOFRAN) 4 MG tablet Take 1 tablet (4 mg total) by mouth every 6 (six) hours as needed for nausea. 07/07/11 07/14/11  Mahina Salatino Tamala Ser, PA  oxyCODONE-acetaminophen (PERCOCET) 5-325 MG per tablet Take 1-2 tablets by mouth every 4 (four) hours as needed. 07/07/11 07/17/11  Bird Swetz Tamala Ser, PA    Diet: heart healthy  Activity:WBAT  Follow-up:in 1 week  Disposition: Improved  Discharged Condition: good   Discharge Orders    Future Orders Please Complete By Expires   Diet - low sodium heart healthy      Call MD / Call 911      Comments:   If you experience chest pain or shortness of breath, CALL 911 and be transported to the hospital emergency room.  If you develope a fever above 101 F, pus (white drainage) or increased drainage or redness at the wound, or calf pain, call your surgeon's office.   Constipation Prevention      Comments:   Drink plenty of fluids.  Prune juice may be helpful.  You may use a stool softener, such as Colace (over the counter) 100 mg twice a day.  Use MiraLax (over the counter) for constipation as needed.   Increase activity slowly as tolerated      Weight Bearing as taught in Physical Therapy      Comments:   Use a walker or crutches as instructed.   Discharge instructions      Comments:   Keep your sling on at all times, including sleeping in your sling. The only time you should remove your sling is to shower only but you need to keep your hand against your chest while you shower.  For  the first few days, remove your dressing, tape a piece of saran wrap over your incision, take your shower, then remove the saran wrap and put a clean dressing on, then reapply your sling. After two days you can shower without the saran wrap.  Call Dr. [name] if any wound complications or temperature of 101 degrees F or over.  Call the office for an appointment to see Dr. [name] in two weeks: [phone] and ask for Dr. Jeannetta Ellis nurse, Mackey Birchwood.   Driving restrictions      Comments:   No driving while on pain meds    Lifting restrictions      Comments:   No lifting     Medication List  As of 07/07/2011  7:55 AM   TAKE these medications         aspirin EC 81 MG tablet   Take 81 mg by mouth daily after breakfast.      Calcium Citrate-Vitamin D 500-400 MG-UNIT Chew   Chew 1 tablet by mouth at bedtime and may repeat dose one time if needed.      levothyroxine 50 MCG tablet   Commonly known as: SYNTHROID, LEVOTHROID   Take 50 mcg by mouth daily before breakfast.      losartan-hydrochlorothiazide 100-25 MG per tablet   Commonly known as: HYZAAR   Take 1 tablet by mouth at bedtime.      methocarbamol 500 MG tablet   Commonly known as: ROBAXIN   Take 1 tablet (500 mg total) by mouth every 6 (six) hours as needed.      mulitivitamin with minerals Tabs   Take 1 tablet by mouth daily.      ondansetron 4  MG tablet   Commonly known as: ZOFRAN   Take 1 tablet (4 mg total) by mouth every 6 (six) hours as needed for nausea.      oxyCODONE-acetaminophen 5-325 MG per tablet   Commonly known as: PERCOCET   Take 1-2 tablets by mouth every 4 (four) hours as needed.      pyridOXINE 100 MG tablet   Commonly known as: VITAMIN B-6   Take 100 mg by mouth daily.      simvastatin 20 MG tablet   Commonly known as: ZOCOR   Take 20 mg by mouth at bedtime.      vitamin B-12 1000 MCG tablet   Commonly known as: CYANOCOBALAMIN   Take 1,000 mcg by mouth daily.      vitamin C 500 MG tablet    Commonly known as: ASCORBIC ACID   Take 500 mg by mouth 2 (two) times daily.      VITAMIN E PO   Take 1 capsule by mouth daily.             Signed: Jenny Lai LAUREN 07/07/2011, 7:55 AM

## 2011-07-07 NOTE — Progress Notes (Signed)
Patient discharged home. Patient and husband verbalized understanding of d/c instructions.taken down to vehicle via wheelchair.

## 2011-07-07 NOTE — Progress Notes (Signed)
Subjective: Improved but some nausea.   Objective: Vital signs in last 24 hours: Temp:  [98 F (36.7 C)-98.5 F (36.9 C)] 98 F (36.7 C) (02/08 1191) Pulse Rate:  [73-88] 73  (02/08 0613) Resp:  [18] 18  (02/08 0613) BP: (133-147)/(60-74) 133/60 mmHg (02/08 0613) SpO2:  [95 %-98 %] 95 % (02/08 0613)  Intake/Output from previous day: 02/07 0701 - 02/08 0700 In: 1920 [P.O.:720; I.V.:1200] Out: 1915 [Urine:1850; Emesis/NG output:65] Intake/Output this shift:    No results found for this basename: HGB:5 in the last 72 hours No results found for this basename: WBC:2,RBC:2,HCT:2,PLT:2 in the last 72 hours No results found for this basename: NA:2,K:2,CL:2,CO2:2,BUN:2,CREATININE:2,GLUCOSE:2,CALCIUM:2 in the last 72 hours No results found for this basename: LABPT:2,INR:2 in the last 72 hours  Neurologically intact  Assessment/Plan: DC Today,and office in 1-week.   Gardenia Witter A 07/07/2011, 7:49 AM

## 2011-07-07 NOTE — Progress Notes (Signed)
OT Note:  Eval completed and placed in shadow chart.  Pt will follow up with Dr. [name] for further therapy when appropriate.  All education completed with pt and sister.  [place], [place] [phone] 07/07/2011

## 2011-07-31 ENCOUNTER — Encounter (HOSPITAL_COMMUNITY): Payer: Self-pay | Admitting: Orthopedic Surgery

## 2011-10-24 ENCOUNTER — Other Ambulatory Visit: Payer: Self-pay | Admitting: Family Medicine

## 2011-10-24 ENCOUNTER — Ambulatory Visit
Admission: RE | Admit: 2011-10-24 | Discharge: 2011-10-24 | Disposition: A | Discharge status: Home / Self Care | Payer: Medicare Other | Source: Ambulatory Visit | Attending: Family Medicine | Admitting: Family Medicine

## 2011-10-24 DIAGNOSIS — R42 Dizziness and giddiness: Secondary | ICD-10-CM

## 2011-10-24 MED ORDER — IOHEXOL 300 MG/ML  SOLN
75.0000 mL | Freq: Once | INTRAMUSCULAR | Status: AC | PRN
  Administered 2011-10-24: 75 mL via INTRAVENOUS

## 2012-04-24 ENCOUNTER — Other Ambulatory Visit: Payer: Self-pay | Admitting: Family Medicine

## 2012-04-24 DIAGNOSIS — Z1231 Encounter for screening mammogram for malignant neoplasm of breast: Secondary | ICD-10-CM

## 2012-06-04 ENCOUNTER — Ambulatory Visit
Admission: RE | Admit: 2012-06-04 | Discharge: 2012-06-04 | Disposition: A | Discharge status: Home / Self Care | Payer: Medicare Other | Source: Ambulatory Visit | Attending: Family Medicine | Admitting: Family Medicine

## 2012-06-04 DIAGNOSIS — Z1231 Encounter for screening mammogram for malignant neoplasm of breast: Secondary | ICD-10-CM

## 2012-12-02 ENCOUNTER — Other Ambulatory Visit: Payer: Self-pay | Admitting: Family Medicine

## 2012-12-02 DIAGNOSIS — R609 Edema, unspecified: Secondary | ICD-10-CM

## 2012-12-02 DIAGNOSIS — R52 Pain, unspecified: Secondary | ICD-10-CM

## 2012-12-03 ENCOUNTER — Ambulatory Visit
Admission: RE | Admit: 2012-12-03 | Discharge: 2012-12-03 | Disposition: A | Discharge status: Home / Self Care | Payer: Medicare Other | Source: Ambulatory Visit | Attending: Family Medicine | Admitting: Family Medicine

## 2012-12-03 DIAGNOSIS — R52 Pain, unspecified: Secondary | ICD-10-CM

## 2012-12-03 DIAGNOSIS — R609 Edema, unspecified: Secondary | ICD-10-CM

## 2013-01-16 ENCOUNTER — Other Ambulatory Visit: Payer: Self-pay | Admitting: Orthopedic Surgery

## 2013-01-16 DIAGNOSIS — R609 Edema, unspecified: Secondary | ICD-10-CM

## 2013-01-16 DIAGNOSIS — M25451 Effusion, right hip: Secondary | ICD-10-CM

## 2013-01-17 ENCOUNTER — Ambulatory Visit
Admission: RE | Admit: 2013-01-17 | Discharge: 2013-01-17 | Disposition: A | Discharge status: Home / Self Care | Payer: Medicare Other | Source: Ambulatory Visit | Attending: Orthopedic Surgery | Admitting: Orthopedic Surgery

## 2013-01-17 DIAGNOSIS — M25451 Effusion, right hip: Secondary | ICD-10-CM

## 2013-01-17 MED ORDER — IOHEXOL 300 MG/ML  SOLN
100.0000 mL | Freq: Once | INTRAMUSCULAR | Status: AC | PRN
  Administered 2013-01-17: 100 mL via INTRAVENOUS

## 2013-03-21 ENCOUNTER — Encounter: Payer: Self-pay | Admitting: Obstetrics & Gynecology

## 2013-03-24 ENCOUNTER — Encounter: Payer: Self-pay | Admitting: Obstetrics & Gynecology

## 2013-03-24 ENCOUNTER — Ambulatory Visit (INDEPENDENT_AMBULATORY_CARE_PROVIDER_SITE_OTHER): Payer: Medicare Other | Admitting: Obstetrics & Gynecology

## 2013-03-24 VITALS — BP 145/81 | HR 92 | Temp 98.1°F | Ht 63.0 in | Wt 178.0 lb

## 2013-03-24 DIAGNOSIS — N760 Acute vaginitis: Secondary | ICD-10-CM

## 2013-03-24 MED ORDER — CLINDAMYCIN HCL 300 MG PO CAPS: 300.0000 mg | ORAL_CAPSULE | Freq: Two times a day (BID) | ORAL | Status: DC

## 2013-03-24 NOTE — Progress Notes (Signed)
  Subjective:     Amber [name] is a 72 y.o. female here for a routine exam.  Current complaints: Patient has been having an unusual discharge - was diagnosed with BV. Personal health questionnaire reviewed: no.   Gynecologic History No LMP recorded. Patient has had a hysterectomy. Contraception: status post hysterectomy- abnormal uterine bleeding Last Pap: 03/06/2013. Results were: normal Last mammogram: up to date. Results were: normal  Obstetric History OB History  No data available     The following portions of the patient's history were reviewed and updated as appropriate: allergies, current medications, past family history, past medical history, past social history, past surgical history and problem list.  Review of Systems Pertinent items are noted in HPI.    Objective:     SPEC: thin, white discharge Bimanual: grade 2 cystocele, rectocele     Assessment:   Desquamative vaginitis/atrophy  Plan:   Clindamycin Specimen-->Medical diagnostics Return in a few months

## 2013-03-24 NOTE — Patient Instructions (Signed)
Vaginitis Vaginitis is an inflammation of the vagina. It is most often caused by a change in the normal balance of the bacteria and yeast that live in the vagina. This change in balance causes an overgrowth of certain bacteria or yeast, which causes the inflammation. There are different types of vaginitis, but the most common types are:  Bacterial vaginosis.  Yeast infection (candidiasis).  Trichomoniasis vaginitis. This is a sexually transmitted infection (STI).  Viral vaginitis.  Atropic vaginitis.  Allergic vaginitis. CAUSES  The cause depends on the type of vaginitis. Vaginitis can be caused by:  Bacteria (bacterial vaginosis).  Yeast (yeast infection).  A parasite (trichomoniasis vaginitis)  A virus (viral vaginitis).  Low hormone levels (atrophic vaginitis). Low hormone levels can occur during pregnancy, breastfeeding, or after menopause.  Irritants, such as bubble baths, scented tampons, and feminine sprays (allergic vaginitis). Other factors can change the normal balance of the yeast and bacteria that live in the vagina. These include:  Antibiotic medicines.  Poor hygiene.  Diaphragms, vaginal sponges, spermicides, birth control pills, and intrauterine devices (IUD).  Sexual intercourse.  Infection.  Uncontrolled diabetes.  A weakened immune system. SYMPTOMS  Symptoms can vary depending on the cause of the vaginitis. Common symptoms include:  Abnormal vaginal discharge.  The discharge is white, gray, or yellow with bacterial vaginosis.  The discharge is thick, white, and cheesy with a yeast infection.  The discharge is frothy and yellow or greenish with trichomoniasis.  A bad vaginal odor.  The odor is fishy with bacterial vaginosis.  Vaginal itching, pain, or swelling.  Painful intercourse.  Pain or burning when urinating. Sometimes, there are no symptoms. TREATMENT  Treatment will vary depending on the type of infection.   Bacterial  vaginosis and trichomoniasis are often treated with antibiotic creams or pills.  Yeast infections are often treated with antifungal medicines, such as vaginal creams or suppositories.  Viral vaginitis has no cure, but symptoms can be treated with medicines that relieve discomfort. Your sexual partner should be treated as well.  Atrophic vaginitis may be treated with an estrogen cream, pill, suppository, or vaginal ring. If vaginal dryness occurs, lubricants and moisturizing creams may help. You may be told to avoid scented soaps, sprays, or douches.  Allergic vaginitis treatment involves quitting the use of the product that is causing the problem. Vaginal creams can be used to treat the symptoms. HOME CARE INSTRUCTIONS   Take all medicines as directed by your caregiver.  Keep your genital area clean and dry. Avoid soap and only rinse the area with water.  Avoid douching. It can remove the healthy bacteria in the vagina.  Do not use tampons or have sexual intercourse until your vaginitis has been treated. Use sanitary pads while you have vaginitis.  Wipe from front to back. This avoids the spread of bacteria from the rectum to the vagina.  Let air reach your genital area.  Wear cotton underwear to decrease moisture buildup.  Avoid wearing underwear while you sleep until your vaginitis is gone.  Avoid tight pants and underwear or nylons without a cotton panel.  Take off wet clothing (especially bathing suits) as soon as possible.  Use mild, non-scented products. Avoid using irritants, such as:  Scented feminine sprays.  Fabric softeners.  Scented detergents.  Scented tampons.  Scented soaps or bubble baths.  Practice safe sex and use condoms. Condoms may prevent the spread of trichomoniasis and viral vaginitis. SEEK MEDICAL CARE IF:   You have abdominal pain.  You   have a fever or persistent symptoms for more than 2 3 days.  You have a fever and your symptoms suddenly  get worse. Document Released: 03/12/2007 Document Revised: 02/07/2012 Document Reviewed: 10/26/2011 [employer] Patient Information 2014 [employer].  

## 2013-03-31 ENCOUNTER — Encounter: Payer: Self-pay | Admitting: Obstetrics & Gynecology

## 2013-04-07 ENCOUNTER — Encounter: Payer: Self-pay | Admitting: Obstetrics & Gynecology

## 2013-04-08 ENCOUNTER — Encounter: Payer: Self-pay | Admitting: Obstetrics & Gynecology

## 2013-04-17 ENCOUNTER — Encounter: Payer: Self-pay | Admitting: Vascular Surgery

## 2013-04-18 ENCOUNTER — Ambulatory Visit (INDEPENDENT_AMBULATORY_CARE_PROVIDER_SITE_OTHER): Payer: Medicare Other | Admitting: Vascular Surgery

## 2013-04-18 ENCOUNTER — Encounter: Payer: Self-pay | Admitting: Vascular Surgery

## 2013-04-18 ENCOUNTER — Encounter (HOSPITAL_COMMUNITY): Payer: Medicare Other

## 2013-04-18 VITALS — BP 150/70 | HR 82 | Ht 63.0 in | Wt 178.2 lb

## 2013-04-18 DIAGNOSIS — I87009 Postthrombotic syndrome without complications of unspecified extremity: Secondary | ICD-10-CM

## 2013-04-18 DIAGNOSIS — I872 Venous insufficiency (chronic) (peripheral): Secondary | ICD-10-CM

## 2013-04-18 DIAGNOSIS — I83893 Varicose veins of bilateral lower extremities with other complications: Secondary | ICD-10-CM | POA: Insufficient documentation

## 2013-04-18 DIAGNOSIS — I739 Peripheral vascular disease, unspecified: Secondary | ICD-10-CM | POA: Insufficient documentation

## 2013-04-18 HISTORY — DX: Postthrombotic syndrome without complications of unspecified extremity: I87.009

## 2013-04-18 NOTE — Progress Notes (Signed)
[facility] [place]  Referred by:  Jacki Cones, MD [address] [place], [place] [address]  Reason for referral: Swollen R>L leg  History of Present Illness  Amber [name] is a 72 y.o. (31-Jan-1941) female who presents with chief complaint: swollen leg.  The patient has a long standing history of sx consistent with CVI.  While pregnant decades ago, she had BLE DVT.  Since then she has extensive varicose veins and aching pain in both legs c/w CVI.  She wears constantly compression pantsy hose.  Recently, she was evaluated for R knee sx.  W/u consistent with meniscal injuries.  She also had extensive swelling in R thigh over the last 4-5 months.  This swelling is improved at this point.  The patient has had known history of DVT, known history of pregnancy, known history of varicose vein, no history of venous stasis ulcers, no history of  Lymphedema and no history of skin changes in lower legs.  There is known family history of venous disorders.  The patient has been used compression stockings in the past.  Past Medical History  Diagnosis Date  . Hypothyroidism   . Hypertension   . Arthritis   . DVT (deep venous thrombosis)   . Family history of blood clots     Past Surgical History  Procedure Laterality Date  . Appendectomy  1979    when did hysterectomy  . Abdominal hysterectomy  1979    partial done  . Back surgery    . Shoulder open rotator cuff repair  07/05/2011    Procedure: ROTATOR CUFF REPAIR SHOULDER OPEN;  Surgeon: Jacki Cones, MD;  Location: [facility];  Service: Orthopedics;  Laterality: Right;    History   Social History  . Marital Status: Married    Spouse Name: N/A    Number of Children: N/A  . Years of Education: N/A   Occupational History  . Not on file.   Social History Main Topics  . Smoking status: Former Smoker -- 0.25 packs/day for 13 years    Types: Cigarettes    Quit date: 06/22/1964  . Smokeless tobacco: Not  on file  . Alcohol Use: No  . Drug Use: No  . Sexual Activity: No   Other Topics Concern  . Not on file   Social History Narrative  . No narrative on file    Family History  Problem Relation Age of Onset  . Heart disease Mother   . Cancer Mother   . Varicose Veins Mother   . Deep vein thrombosis Mother   . Diabetes Mother   . Hypertension Mother   . Heart disease Father   . Varicose Veins Sister   . Cancer Sister   . Diabetes Sister   . Heart disease Sister     before age 96  . Hypertension Sister   . Vision loss Sister   . Diabetes Brother   . Vision loss Brother   . Peripheral vascular disease Brother   . AAA (abdominal aortic aneurysm) Brother   . Diabetes Sister   . Heart disease Sister   . Hypertension Sister   . Vision loss Sister   . Diabetes Sister   . Vision loss Sister    Current Outpatient Prescriptions on File Prior to Visit  Medication Sig Dispense Refill  . aspirin EC 81 MG tablet Take 81 mg by mouth daily after breakfast.      . levothyroxine (SYNTHROID, LEVOTHROID) 50  MCG tablet Take 50 mcg by mouth daily before breakfast.      . losartan-hydrochlorothiazide (HYZAAR) 100-25 MG per tablet Take 1 tablet by mouth at bedtime.      . Multiple Vitamin (MULITIVITAMIN WITH MINERALS) TABS Take 1 tablet by mouth daily.      Marland Kitchen pyridOXINE (VITAMIN B-6) 100 MG tablet Take 100 mg by mouth daily.      . simvastatin (ZOCOR) 20 MG tablet Take 20 mg by mouth at bedtime.      . vitamin B-12 (CYANOCOBALAMIN) 1000 MCG tablet Take 1,000 mcg by mouth daily.      . vitamin C (ASCORBIC ACID) 500 MG tablet Take 500 mg by mouth 2 (two) times daily.      . Calcium Citrate-Vitamin D 500-400 MG-UNIT CHEW Chew 1 tablet by mouth at bedtime and may repeat dose one time if needed.      . clindamycin (CLEOCIN) 300 MG capsule Take 1 capsule (300 mg total) by mouth 2 (two) times daily.  14 capsule  0  . VITAMIN E PO Take 1 capsule by mouth daily.       No current facility-administered  medications on file prior to visit.    Allergies  Allergen Reactions  . Codeine Nausea And Vomiting  . Darvocet [Propoxyphene-Acetaminophen] Nausea And Vomiting  . Nickel Rash  . Sulfa Antibiotics Rash    Like a sunburn     REVIEW OF SYSTEMS:  (Positives checked otherwise negative)  CARDIOVASCULAR:  []  chest pain, []  chest pressure, []  palpitations, []  shortness of breath when laying flat, []  shortness of breath with exertion,  [x]  pain in feet when walking, [x]  pain in feet when laying flat, [x]  history of blood clot in veins (DVT), [x]  history of phlebitis, [x]  swelling in legs, [x]  varicose veins  PULMONARY:  []  productive cough, []  asthma, []  wheezing  NEUROLOGIC:  []  weakness in arms or legs, []  numbness in arms or legs, []  difficulty speaking or slurred speech, []  temporary loss of vision in one eye, []  dizziness  HEMATOLOGIC:  []  bleeding problems, []  problems with blood clotting too easily  MUSCULOSKEL:  []  joint pain, []  joint swelling  GASTROINTEST:  []  vomiting blood, []  blood in stool     GENITOURINARY:  []  burning with urination, []  blood in urine  PSYCHIATRIC:  []  history of major depression  INTEGUMENTARY:  []  rashes, []  ulcers  CONSTITUTIONAL:  []  fever, []  chills   Physical Examination Filed Vitals:   04/18/13 1106  BP: 150/70  Pulse: 82  Height: 5\' 3"  (1.6 m)  Weight: 178 lb 3.2 oz (80.831 kg)  SpO2: 97%   Body mass index is 31.57 kg/(m^2).  General: A&O x 3, WDWN  Head: Hall/AT  Ear/Nose/Throat: Hearing grossly intact, nares w/o erythema or drainage, oropharynx w/o Erythema/Exudate  Eyes: PERRLA, EOMI  Neck: Supple, no nuchal rigidity, no palpable LAD  Pulmonary: Sym exp, good air movt, CTAB, no rales, rhonchi, & wheezing  Cardiac: RRR, Nl S1, S2, no Murmurs, rubs or gallops  Vascular: Vessel Right Left  Radial Palpable Palpable  Brachial Palpable Palpable  Carotid Palpable, without bruit Palpable, without bruit  Aorta Not palpable  N/A  Femoral Palpable Palpable  Popliteal Not palpable Not palpable  PT Not Palpable Not Palpable  DP Not Palpable Not Palpable   Gastrointestinal: soft, NTND, -G/R, - HSM, - masses, - CVAT B  Musculoskeletal: M/S 5/5 throughout , Extremities without ischemic changes , extensive varicosities in B lower legs, no LDS, no  edema in either feet  Neurologic: CN 2-12 intact , Pain and light touch intact in extremities , Motor exam as listed above  Psychiatric: Judgment intact, Mood & affect appropriate for pt's clinical situation  Dermatologic: See M/S exam for extremity exam, no rashes otherwise noted  Lymph : No Cervical, Axillary, or Inguinal lymphadenopathy   Outside Studies/Documentation 4 pages of outside documents were reviewed including: outside orthopedic evaluation.  Medical Decision Making  Amber [name] is a 72 y.o. female who presents with: likely BLE chronic venous insufficiency (CVI) with post-phlebitic syndrome, sx varicose veins   Based on the patient's history and examination, I recommend: BLE venous insufficiency duplex, screening BLE ABI as I don't feel any pedal pulses.  The patient is already using OTC compression stockings with good compliance.    She will follow up within the next 4 weeks for the above studies.  Thank you for allowing [place] to participate in this patient's care.  Leonides Sake, MD [facility] Office: [phone] Pager: [phone]  04/18/2013, 12:10 PM

## 2013-04-23 ENCOUNTER — Other Ambulatory Visit: Payer: Self-pay

## 2013-04-23 DIAGNOSIS — R0989 Other specified symptoms and signs involving the circulatory and respiratory systems: Secondary | ICD-10-CM

## 2013-04-23 DIAGNOSIS — I872 Venous insufficiency (chronic) (peripheral): Secondary | ICD-10-CM

## 2013-04-23 DIAGNOSIS — I83893 Varicose veins of bilateral lower extremities with other complications: Secondary | ICD-10-CM

## 2013-04-23 DIAGNOSIS — M7989 Other specified soft tissue disorders: Secondary | ICD-10-CM

## 2013-05-07 ENCOUNTER — Encounter: Payer: Self-pay | Admitting: Obstetrics & Gynecology

## 2013-05-07 ENCOUNTER — Ambulatory Visit (INDEPENDENT_AMBULATORY_CARE_PROVIDER_SITE_OTHER): Payer: Medicare Other | Admitting: Obstetrics & Gynecology

## 2013-05-07 DIAGNOSIS — IMO0002 Reserved for concepts with insufficient information to code with codable children: Secondary | ICD-10-CM

## 2013-05-07 DIAGNOSIS — N8111 Cystocele, midline: Secondary | ICD-10-CM

## 2013-05-07 NOTE — Progress Notes (Signed)
Subjective:     Amber [name] is a 72 y.o. female here for a follow up exam.  Current complaints: Pt states that her symptoms have improved since last visit.  Pt finished her course of antibiotics.  Pt states that she is currently having some brown discharge along with some mucous.   Pt would like to know if her bladder is doing well or if it has dropped more.  Pt would like to know if there are any preventative measures to take at home.  Pt states that she is having some dizziness in the mornings and evenings.  Pt thinks this may related to her BP medication.  Personal health questionnaire reviewed: yes.   Gynecologic History No LMP recorded. Patient has had a hysterectomy. Contraception: status post hysterectomy Last Pap: 03/06/13. Results were: normal   Obstetric History OB History  No data available     The following portions of the patient's history were reviewed and updated as appropriate: allergies, current medications, past family history, past medical history, past social history, past surgical history and problem list.  Review of Systems Pertinent items are noted in HPI.    Objective:    Pelvic: grade 2 cystocele SPEC: scant, thin white discharge  Assessment:   Mild POP ?vaginitis symptoms--reassured; exam normal  Plan:    Return if prolapse symptoms worsen

## 2013-05-21 ENCOUNTER — Encounter: Payer: Self-pay | Admitting: Vascular Surgery

## 2013-05-23 ENCOUNTER — Encounter (HOSPITAL_COMMUNITY): Payer: Medicare Other

## 2013-05-23 ENCOUNTER — Ambulatory Visit: Payer: Medicare Other | Admitting: Vascular Surgery

## 2013-05-23 ENCOUNTER — Ambulatory Visit (INDEPENDENT_AMBULATORY_CARE_PROVIDER_SITE_OTHER)
Admission: RE | Admit: 2013-05-23 | Discharge: 2013-05-23 | Disposition: A | Discharge status: Home / Self Care | Payer: Medicare Other | Source: Ambulatory Visit | Attending: Vascular Surgery | Admitting: Vascular Surgery

## 2013-05-23 ENCOUNTER — Ambulatory Visit (HOSPITAL_COMMUNITY)
Admission: RE | Admit: 2013-05-23 | Discharge: 2013-05-23 | Disposition: A | Discharge status: Home / Self Care | Payer: Medicare Other | Source: Ambulatory Visit | Attending: Vascular Surgery | Admitting: Vascular Surgery

## 2013-05-23 ENCOUNTER — Ambulatory Visit (INDEPENDENT_AMBULATORY_CARE_PROVIDER_SITE_OTHER): Payer: Medicare Other | Admitting: Family

## 2013-05-23 ENCOUNTER — Encounter: Payer: Self-pay | Admitting: Family

## 2013-05-23 VITALS — BP 142/82 | HR 79 | Resp 16 | Ht 63.5 in | Wt 179.0 lb

## 2013-05-23 DIAGNOSIS — I739 Peripheral vascular disease, unspecified: Secondary | ICD-10-CM

## 2013-05-23 DIAGNOSIS — M7989 Other specified soft tissue disorders: Secondary | ICD-10-CM

## 2013-05-23 DIAGNOSIS — I872 Venous insufficiency (chronic) (peripheral): Secondary | ICD-10-CM

## 2013-05-23 DIAGNOSIS — I83893 Varicose veins of bilateral lower extremities with other complications: Secondary | ICD-10-CM

## 2013-05-23 DIAGNOSIS — R0989 Other specified symptoms and signs involving the circulatory and respiratory systems: Secondary | ICD-10-CM

## 2013-05-23 NOTE — Progress Notes (Signed)
Established Venous Insufficiency  History of Present Illness  Amber [name] is a 72 y.o. (04-13-1941) female patient that Dr. Imogene Burn saw initially a month ago, was referred by Dr. [name] for swelling in legs, right more so than left. She has a long standing history of sx consistent with chronic venous insufficiency. While pregnant decades ago, she had BLE DVT. Since then she has extensive varicose veins and aching pain in both legs c/w CVI. She wears daily compression panty hose, she brought a pair with her and these hose are less than 20-30 mm Hg compression. Recently, she was evaluated for R knee sx. W/u consistent with meniscal injuries. She also had extensive swelling in R thigh over the last 4-5 months. This swelling is improved at this point. The patient has had known history of DVT, known history of pregnancy, known history of varicose vein, no history of venous stasis ulcers, no history of Lymphedema and no history of skin changes in lower legs. There is known family history of venous disorders.  Pedal pulses were not palpable 4 weeks ago. She returns today for BLE venous insufficiency duplex and screening ABI's.  Her legs ache with standing, aching increases the longer she stands. She denies claudication symptoms, denies non-healing wounds. She denies any history of stroke or TIA symptoms. She had right rotator cuff surgery February, 2013 and still has problems with her right shoulder. She reports that she has the symptoms of rotator cuff injury in her left shoulder recently, and that she will have difficulty donning high compression hose. She states that she prefers panty hose compression hose instead of thigh high however.  Past Medical History  Diagnosis Date  . Hypothyroidism   . Hypertension   . Arthritis   . DVT (deep venous thrombosis)   . Family history of blood clots    Past Surgical History  Procedure Laterality Date  . Appendectomy  1979    when did hysterectomy  .  Abdominal hysterectomy  1979    partial done  . Back surgery    . Shoulder open rotator cuff repair  07/05/2011    Procedure: ROTATOR CUFF REPAIR SHOULDER OPEN;  Surgeon: Jacki Cones, MD;  Location: [facility];  Service: Orthopedics;  Laterality: Right;   History   Social History  . Marital Status: Married    Spouse Name: N/A    Number of Children: N/A  . Years of Education: N/A   Occupational History  . Not on file.   Social History Main Topics  . Smoking status: Former Smoker -- 0.25 packs/day for 13 years    Types: Cigarettes    Quit date: 06/22/1964  . Smokeless tobacco: Not on file  . Alcohol Use: No  . Drug Use: No  . Sexual Activity: No   Other Topics Concern  . Not on file   Social History Narrative  . No narrative on file   Family History  Problem Relation Age of Onset  . Heart disease Mother   . Cancer Mother   . Varicose Veins Mother   . Deep vein thrombosis Mother   . Diabetes Mother   . Hypertension Mother   . Heart disease Father   . Varicose Veins Sister   . Cancer Sister   . Diabetes Sister   . Heart disease Sister     before age 26  . Hypertension Sister   . Vision loss Sister   . Diabetes Brother   . Vision loss Brother   .  Peripheral vascular disease Brother   . AAA (abdominal aortic aneurysm) Brother   . Diabetes Sister   . Heart disease Sister   . Hypertension Sister   . Vision loss Sister   . Diabetes Sister   . Vision loss Sister    Current Outpatient Prescriptions on File Prior to Visit  Medication Sig Dispense Refill  . aspirin EC 81 MG tablet Take 81 mg by mouth daily after breakfast.      . calcium carbonate (OS-CAL) 600 MG TABS tablet Take 600 mg by mouth 2 (two) times daily with a meal.      . Calcium Citrate-Vitamin D 500-400 MG-UNIT CHEW Chew 1 tablet by mouth at bedtime and may repeat dose one time if needed.      . Cholecalciferol (VITAMIN D-3) 1000 UNITS CAPS Take 1 capsule by mouth daily.      . clindamycin (CLEOCIN)  300 MG capsule Take 1 capsule (300 mg total) by mouth 2 (two) times daily.  14 capsule  0  . levothyroxine (SYNTHROID, LEVOTHROID) 50 MCG tablet Take 50 mcg by mouth daily before breakfast.      . losartan-hydrochlorothiazide (HYZAAR) 100-25 MG per tablet Take 1 tablet by mouth at bedtime.      . Multiple Vitamin (MULITIVITAMIN WITH MINERALS) TABS Take 1 tablet by mouth daily.      Marland Kitchen pyridOXINE (VITAMIN B-6) 100 MG tablet Take 100 mg by mouth daily.      . simvastatin (ZOCOR) 20 MG tablet Take 20 mg by mouth at bedtime.      . vitamin B-12 (CYANOCOBALAMIN) 1000 MCG tablet Take 1,000 mcg by mouth daily.      . vitamin C (ASCORBIC ACID) 500 MG tablet Take 500 mg by mouth 2 (two) times daily.      Marland Kitchen VITAMIN E PO Take 1 capsule by mouth daily.       No current facility-administered medications on file prior to visit.   Allergies  Allergen Reactions  . Codeine Nausea And Vomiting  . Darvocet [Propoxyphene N-Acetaminophen] Nausea And Vomiting  . Azaryah Heathcock Rash  . Sulfa Antibiotics Rash    Like a sunburn    On ROS today:See HPI for pertinent positives and negatives.  Physical Examination  Filed Vitals:   05/23/13 1548  BP: 142/82  Pulse: 79  Resp: 16   Filed Weights   05/23/13 1548  Weight: 179 lb (81.194 kg)   Body mass index is 31.21 kg/(m^2).  General: A&O x 3, WD, Obese, accompanied by daughter.  Pulmonary: Sym exp, good air movt, CTAB, no rales, rhonchi, or wheezing.   Cardiac: RRR, Nl S1, S2, no Murmurs.  Vascular: Vessel Right Left  Radial Palpable Palpable  Brachial Palpable Palpable  Carotid  without bruit  without bruit  Popliteal Not palpable Not palpable  PT Palpable Palpable  DP Palpable Palpable   Gastrointestinal: soft, NTND, -G/R, - HSM, - masses, - CVAT B.  Musculoskeletal: M/S 4/5 throughout, Extremities without ischemic changes. No venous stasis skin changes, no ulcers. Moderate sized varicosities on both legs, upper and lower legs.  Neurologic: Pain  and light touch intact in extremities, Motor exam as listed above  Non-Invasive Vascular Imaging  ABI's (05/23/2013): Right: 1.16, Left: 1.13; both are triphasic waveforms  BLE Venous Insufficiency Duplex (Date: 05/23/2013):   RLE: No DVT or SVT, Positive GSV reflux, Positive deep venous reflux  LLE: No DVT or SVT, Positive GSV reflux, No deep venous reflux  Medical Decision Making  Katreena Vevelyn Francois  is a 73 y.o. female who presents with: Bilateral leg chronic venous insufficiency (CVI). She has no DVT nor superficial venous thrombosis in either leg, has greater saphenous vein reflux in both legs which is amenable to GSV ablation.  ABI's are normal indicating no arterial occlusive disease.  Based on the patient's vascular studies and examination, and after discussing with Dr. Imogene Burn, the patient was offered: 3 months conservative therapy of wearing 20-30 mm Hg graduated compression hose during the day, remove at night; prescription for same given. Elevate legs above heart level when not walking.  She prefers panty hose or thigh high with waist assist.  I discussed with the patient the use of her 20-30 mm thigh high compression stockings and need for 3 month trial of same.  After a 3 months trial, she will follow up with Dr. Hart Rochester or Dr. Arbie Cookey for evaluation in the Vein [facility].  Thank you for allowing [place] to participate in this patient's care.  Charisse March, RN, MSN,FNP-C [facility] Office: [phone]  Clinic MD: Imogene Burn  05/23/2013, 2:58 PM

## 2013-05-23 NOTE — Patient Instructions (Addendum)
Venous Stasis and Chronic Venous Insufficiency  As people age, the veins located in their legs may weaken and stretch. When veins weaken and lose the ability to pump blood effectively, the condition is called chronic venous insufficiency (CVI) or venous stasis. Almost all veins return blood back to the heart. This happens by:   The force of the heart pumping fresh blood pushes blood back to the heart.   Blood flowing to the heart from the force of gravity.  In the deep veins of the legs, blood has to fight gravity and flow upstream back to the heart. Here, the leg muscles contract to pump blood back toward the heart. Vein walls are elastic, and many veins have small valves that only allow blood to flow in one direction. When leg muscles contract, they push inward against the elastic vein walls. This squeezes blood upward, opens the valves, and moves blood toward the heart. When leg muscles relax, the vein wall also relaxes and the valves inside the vein close to prevent blood from flowing backward. This method of pumping blood out of the legs is called the venous pump.  CAUSES   The venous pump works best while walking and leg muscles are contracting. But when a person sits or stands, blood pressure in leg veins can build. Deep veins are usually able to withstand short periods of inactivity, but long periods of inactivity (and increased pressure) can stretch, weaken, and damage vein walls. High blood pressure can also stretch and damage vein walls. The veins may no longer be able to pump blood back to the heart. Venous hypertension (high blood pressure inside veins) that lasts over time is a primary cause of CVI. CVI can also be caused by:    Deep vein thrombosis, a condition where a thrombus (blood clot) blocks blood flow in a vein.   Phlebitis, an inflammation of a superficial vein that causes a blood clot to form.  Other risk factors for CVI may include:    Heredity.   Obesity.   Pregnancy.    Sedentary lifestyle.   Smoking.   Jobs requiring long periods of standing or sitting in one place.   Age and gender:   Women in their 40's and 50's and men in their 70's are more prone to developing CVI.  SYMPTOMS   Symptoms of CVI may include:    Varicose veins.   Ulceration or skin breakdown.   Lipodermatosclerosis, a condition that affects the skin just above the ankle, usually on the inside surface. Over time the skin becomes brown, smooth, tight and often painful. Those with this condition have a high risk of developing skin ulcers.   Reddened or discolored skin on the leg.   Swelling.  DIAGNOSIS   Your caregiver can diagnose CVI after performing a careful medical history and physical examination. To confirm the diagnosis, the following tests may also be ordered:    Duplex ultrasound.   Plethysmography (tests blood flow).   Venograms (x-ray using a special dye).  TREATMENT  The goals of treatment for CVI are to restore a person to an active life and to minimize pain or disability. Typically, CVI does not pose a serious threat to life or limb, and with proper treatment most people with this condition can continue to lead active lives. In most cases, mild CVI can be treated on an outpatient basis with simple procedures. Treatment methods include:    Elastic compression socks.   Sclerotherapy, a procedure involving an injection of   a material that "dissolves" the damaged veins. Other veins in the network of blood vessels take over the function of the damaged veins.   Vein stripping (an older procedure less commonly used).   Laser Ablation surgery.   Valve repair.  HOME CARE INSTRUCTIONS    Elastic compression socks must be worn every day. They can help with symptoms and lower the chances of the problem getting worse, but they do not cure the problem.   Only take over-the-counter or prescription medicines for pain, discomfort, or fever as directed by your caregiver.    Your caregiver will review your other medications with you.  SEEK MEDICAL CARE IF:    You are confused about how to take your medications.   There is redness, swelling, or increasing pain in the affected area.   There is a red streak or line that extends up or down from the affected area.   There is a breakdown or loss of skin in the affected area, even if the breakdown is small.   You develop an unexplained oral temperature above 102 F (38.9 C).   There is an injury to the affected area.  SEEK IMMEDIATE MEDICAL CARE IF:    There is an injury and open wound to the affected area.   Pain is not adequately relieved with pain medication prescribed or becomes severe.   An oral temperature above 102 F (38.9 C) develops.   The foot/ankle below the affected area becomes suddenly numb or the area feels weak and hard to move.  MAKE SURE YOU:    Understand these instructions.   Will watch your condition.   Will get help right away if you are not doing well or get worse.  Document Released: 09/18/2006 Document Revised: 08/07/2011 Document Reviewed: 11/26/2006  [employer] Patient Information 2014 [employer].

## 2013-06-12 ENCOUNTER — Other Ambulatory Visit: Payer: Self-pay

## 2013-06-12 DIAGNOSIS — Z1231 Encounter for screening mammogram for malignant neoplasm of breast: Secondary | ICD-10-CM

## 2013-07-01 ENCOUNTER — Ambulatory Visit
Admission: RE | Admit: 2013-07-01 | Discharge: 2013-07-01 | Disposition: A | Discharge status: Home / Self Care | Payer: Medicare Other | Source: Ambulatory Visit

## 2013-07-01 DIAGNOSIS — Z1231 Encounter for screening mammogram for malignant neoplasm of breast: Secondary | ICD-10-CM

## 2013-09-01 ENCOUNTER — Encounter: Payer: Self-pay | Admitting: Vascular Surgery

## 2013-09-02 ENCOUNTER — Ambulatory Visit (INDEPENDENT_AMBULATORY_CARE_PROVIDER_SITE_OTHER): Payer: Medicare Other | Admitting: Vascular Surgery

## 2013-09-02 ENCOUNTER — Other Ambulatory Visit: Payer: Self-pay | Admitting: *Deleted

## 2013-09-02 ENCOUNTER — Encounter: Payer: Self-pay | Admitting: Vascular Surgery

## 2013-09-02 VITALS — BP 161/66 | HR 92 | Resp 18 | Ht 62.0 in | Wt 179.7 lb

## 2013-09-02 DIAGNOSIS — I83893 Varicose veins of bilateral lower extremities with other complications: Secondary | ICD-10-CM

## 2013-09-02 DIAGNOSIS — M7989 Other specified soft tissue disorders: Secondary | ICD-10-CM | POA: Insufficient documentation

## 2013-09-02 NOTE — Progress Notes (Signed)
Problems with Activities of Daily Living Secondary to Leg Pain  1. Amber [name] states that any activities that require prolonged standing (cooking, cleaning, shopping)  are difficult due to leg pain and swelling.  2. [name] states that traveling (in car and on plane) is difficult due to leg pain.    3. [name] has difficulty exercising due to leg pain.    Failure of  Conservative Therapy:  1. Worn 20-30 mm Hg thigh high compression hose >3 months with no relief of symptoms.  2. Frequently elevates legs-no relief of symptoms  3. Taken Ibuprofen 600 Mg TID with no relief of symptoms.  Here today for continued all of her severe bilateral venous hypertension. She has been extremely compliant with thigh-high pain he style compression for many years. Despite this he is having progressively severe symptoms related to her venous hypertension. She does have achy sensation in both legs with prolonged swelling and increasing difficult to control swelling more so on her right leg. She does have multiple varicosities on both legs and does have pain specifically over these as well.  I did review her venous duplex from December and also reimage her veins with the SonoSite ultrasound myself. This does show gross reflux with large great saphenous vein bilaterally.  I feel that she is clearly failed conservative treatment. I have recommended staged bilateral laser ablation of her great saphenous vein. I did explain that she may require stab phlebectomy of tributary varicosities as a staged procedure as well. She reports that her pain is more severe on the right we would proceed with the right leg initially and then the left leg great saphenous vein ablation. She wished to proceed as soon as possible.

## 2013-09-04 ENCOUNTER — Other Ambulatory Visit: Payer: Self-pay | Admitting: *Deleted

## 2013-09-04 ENCOUNTER — Telehealth: Payer: Self-pay | Admitting: Vascular Surgery

## 2013-09-04 DIAGNOSIS — I83893 Varicose veins of bilateral lower extremities with other complications: Secondary | ICD-10-CM

## 2013-09-04 NOTE — Telephone Encounter (Addendum)
Message copied by Fredrich BirksMILLIKAN, DANA P on Thu Sep 04, 2013  1:12 PM ------      Message from: Domenic MorasANKIN, SONYA D      Created: Tue Sep 02, 2013  5:25 PM      Regarding: scheduling       Please schedule Amber [name] for post LA duplex (right leg, order in EPIC) and VV FU with Dr. Arbie CookeyEarly on 09-25-2013.  I think you held slots open for me.  This is vein add-on pt. Being done on 09-15-2013.  I will be scheduling one more pt. on the same day (09-25-2013) later this week.  Thanks! ------  09/04/13: scheduled, dpm

## 2013-09-12 ENCOUNTER — Encounter: Payer: Self-pay | Admitting: Vascular Surgery

## 2013-09-15 ENCOUNTER — Ambulatory Visit (INDEPENDENT_AMBULATORY_CARE_PROVIDER_SITE_OTHER): Payer: Self-pay | Admitting: Vascular Surgery

## 2013-09-15 ENCOUNTER — Encounter: Payer: Self-pay | Admitting: Vascular Surgery

## 2013-09-15 VITALS — BP 131/83 | HR 84 | Resp 18 | Ht 62.0 in | Wt 179.0 lb

## 2013-09-15 DIAGNOSIS — I83893 Varicose veins of bilateral lower extremities with other complications: Secondary | ICD-10-CM

## 2013-09-15 HISTORY — PX: ENDOVENOUS ABLATION SAPHENOUS VEIN W/ LASER: SUR449

## 2013-09-15 NOTE — Progress Notes (Signed)
   Laser Ablation Procedure      Date: 09/15/2013    Amber [name] DOB:Oct 16, 1940  Consent signed: Yes  Surgeon:T.F. Verna Hamon  Procedure: Laser Ablation: right Greater Saphenous Vein  BP 131/83  Pulse 84  Resp 18  Ht 5\' 2"  (1.575 m)  Wt 179 lb (81.194 kg)  BMI 32.73 kg/m2  Start time: 8:30 am   End time: 9:35 am  Tumescent Anesthesia: 425 cc 0.9% NaCl with 50 cc Lidocaine HCL with 1% Epi and 15 cc 8.4% NaHCO3  Local Anesthesia: 3 cc Lidocaine HCL and NaHCO3 (ratio 2:1)  Continuous Mode: 15 Watts Total Energy 2458 joules Total Time2:44       Patient tolerated procedure well: Yes    Description of Procedure:  After marking the course of the saphenous vein and the secondary varicosities in the standing position, the patient was placed on the operating table in the supine position, and the right leg was prepped and draped in sterile fashion. Local anesthetic was administered, and under ultrasound guidance the saphenous vein was accessed with a micro needle and guide wire; then the micro puncture sheath was placed. A guide wire was inserted to the saphenofemoral junction, followed by a 5 french sheath.  The position of the sheath and then the laser fiber below the junction was confirmed using the ultrasound and visualization of the aiming beam.  Tumescent anesthesia was administered along the course of the saphenous vein using ultrasound guidance. Protective laser glasses were placed on the patient, and the laser was fired at at 15 watt continuous mode.  For a total of 2458 joules.  A steri strip was applied to the puncture site.    ABD pads and thigh high compression stockings were applied.  Ace wrap bandages were applied over the phlebectomy sites and at the top of the saphenofemoral junction.  Blood loss was less than 15 cc.  The patient ambulated out of the operating room having tolerated the procedure well.

## 2013-09-16 ENCOUNTER — Telehealth: Payer: Self-pay | Admitting: *Deleted

## 2013-09-16 ENCOUNTER — Encounter: Payer: Self-pay | Admitting: Vascular Surgery

## 2013-09-16 NOTE — Telephone Encounter (Signed)
    09/16/2013  Time: 11:04 AM   Patient Name: Amber [name]  Patient of: T.F. Early  Procedure:Laser Ablation right greater saphenous vein  09-15-2013    Reached patient at home and checked  Her status  Yes    Comments/Actions Taken: Amber [name] states no problems with swelling and minimal discomfort over right upper thigh.  States she is taking Ibuprofen as directed and using ice compress as needed.  States the compression dressing has been rolling down right thigh so she has put a pair of pantyhose over top of compression dressing that is holding the compression dressing in place and is comfortable.  Reviewed post procedural instructions with her and reminded her of post laser ablation duplex and VV Fu with Dr. Arbie CookeyEarly on 09-25-2013.      @SIGNATURE @

## 2013-09-24 ENCOUNTER — Encounter: Payer: Self-pay | Admitting: Vascular Surgery

## 2013-09-25 ENCOUNTER — Ambulatory Visit (INDEPENDENT_AMBULATORY_CARE_PROVIDER_SITE_OTHER): Payer: Medicare Other | Admitting: Vascular Surgery

## 2013-09-25 ENCOUNTER — Encounter: Payer: Self-pay | Admitting: Vascular Surgery

## 2013-09-25 ENCOUNTER — Ambulatory Visit (HOSPITAL_COMMUNITY)
Admission: RE | Admit: 2013-09-25 | Discharge: 2013-09-25 | Disposition: A | Discharge status: Home / Self Care | Payer: Medicare Other | Source: Ambulatory Visit | Attending: Vascular Surgery | Admitting: Vascular Surgery

## 2013-09-25 VITALS — BP 143/85 | HR 92 | Resp 16 | Ht 62.5 in | Wt 178.0 lb

## 2013-09-25 DIAGNOSIS — I83893 Varicose veins of bilateral lower extremities with other complications: Secondary | ICD-10-CM | POA: Insufficient documentation

## 2013-09-25 NOTE — Progress Notes (Signed)
Right great saphenous vein ablation for venous hypertension. She did quite well the procedure and has had minimal discomfort following this. She has been compliant with her graduated compression garments  On physical exam: She has no bruising. She does have 2+ dorsalis pedis pulse. There is some compression decompression and the veins in her calf.  Duplex today reveals closure of her great saphenous vein from the distal insertion site just below her knee to within 2 cm of her saphenofemoral junction. There is no evidence of DVT    Impression and plan: Stable status post ablation of her right great saphenous vein one week ago. She will continue her usual activities. We will see her again on 10/16/2013 for left great saphenous vein ablation

## 2013-10-10 ENCOUNTER — Telehealth: Payer: Self-pay | Admitting: *Deleted

## 2013-10-10 NOTE — Telephone Encounter (Signed)
Amber [name] is scheduled for endovenous laser ablation of left greater saphenous vein on 10-16-2013 with Dr. Arbie CookeyEarly.  Amber [name] has a cortisone injection of her left knee scheduled for Monday, May 18 and is wondering if she should defer the injection until after she has has the endovenous laser ablation.  Amber [name] states that her knee pain has decreased significantly.  Recommended that she defer cortisone injection until after she has healed from the endovenous laser ablation scheduled for 10-16-2013.  Amber [name] states she will call and cancel the cortisone injection and defer it until later.  Encouraged her to call [facility] if she has further questions or concerns.

## 2013-10-15 ENCOUNTER — Encounter: Payer: Self-pay | Admitting: Vascular Surgery

## 2013-10-16 ENCOUNTER — Ambulatory Visit (INDEPENDENT_AMBULATORY_CARE_PROVIDER_SITE_OTHER): Payer: Medicare Other | Admitting: Vascular Surgery

## 2013-10-16 ENCOUNTER — Encounter: Payer: Self-pay | Admitting: Vascular Surgery

## 2013-10-16 VITALS — BP 134/72 | HR 87 | Resp 18 | Ht 63.5 in | Wt 178.0 lb

## 2013-10-16 DIAGNOSIS — I83893 Varicose veins of bilateral lower extremities with other complications: Secondary | ICD-10-CM

## 2013-10-16 HISTORY — PX: ENDOVENOUS ABLATION SAPHENOUS VEIN W/ LASER: SUR449

## 2013-10-16 NOTE — Progress Notes (Signed)
   Laser Ablation Procedure      Date: 10/16/2013    Amber [name] DOB:[birth year]  Consent signed: Yes  Surgeon:Amber [name]  Procedure: Laser Ablation: left Greater Saphenous Vein  BP 134/72  Pulse 87  Resp 18  Ht 5' 3.5" (1.613 m)  Wt 178 lb (80.74 kg)  BMI 31.03 kg/m2  Start time: 10:40am   End time: 11:35am  Tumescent Anesthesia: 475 cc 0.9% NaCl with 50 cc Lidocaine HCL with 1% Epi and 15 cc 8.4% NaHCO3  Local Anesthesia: 2 cc Lidocaine HCL and NaHCO3 (ratio 2:1)  Continuous Mode: 15 Watts Total Energy 2299 Joules Total Time2:33       Patient tolerated procedure well: Yes    Description of Procedure:  After marking the course of the saphenous vein and the secondary varicosities in the standing position, the patient was placed on the operating table in the supine position, and the left leg was prepped and draped in sterile fashion. Local anesthetic was administered, and under ultrasound guidance the saphenous vein was accessed with a micro needle and guide wire; then the micro puncture sheath was placed. A guide wire was inserted to the saphenofemoral junction, followed by a 5 french sheath.  The position of the sheath and then the laser fiber below the junction was confirmed using the ultrasound and visualization of the aiming beam.  Tumescent anesthesia was administered along the course of the saphenous vein using ultrasound guidance. Protective laser glasses were placed on the patient, and the laser was fired at at 15 watt continuous mode.  For a total of 2299 joules.  A steri strip was applied to the puncture site.      ABD pads and thigh high compression stockings were applied.  Ace wrap bandages were applied  at the top of the saphenofemoral junction.  Blood loss was less than 15 cc.  The patient ambulated out of the operating room having tolerated the procedure well.

## 2013-10-21 ENCOUNTER — Telehealth: Payer: Self-pay | Admitting: *Deleted

## 2013-10-21 ENCOUNTER — Encounter: Payer: Self-pay | Admitting: Vascular Surgery

## 2013-10-21 NOTE — Telephone Encounter (Signed)
    10/21/2013  Time: 9:10 AM   Patient Name: Amber [name]  Patient of: T.F. Early  Procedure:Laser Ablation left greater saphenous vein 10-16-2013  Reached patient at home and checked  Her status  Yes    Comments/Actions Taken: Mrs. Arevalos states no problems with leg pain or swelling.  Reviewed post procedural instructions with her and reminded her of post laser ablation duplex and VV follow up with Dr. Arbie Cookey on 10-23-2013.      @SIGNATURE @

## 2013-10-22 ENCOUNTER — Encounter: Payer: Self-pay | Admitting: Vascular Surgery

## 2013-10-23 ENCOUNTER — Ambulatory Visit (HOSPITAL_COMMUNITY)
Admission: RE | Admit: 2013-10-23 | Discharge: 2013-10-23 | Disposition: A | Discharge status: Home / Self Care | Payer: Medicare Other | Source: Ambulatory Visit | Attending: Vascular Surgery | Admitting: Vascular Surgery

## 2013-10-23 ENCOUNTER — Encounter: Payer: Self-pay | Admitting: Vascular Surgery

## 2013-10-23 ENCOUNTER — Ambulatory Visit (INDEPENDENT_AMBULATORY_CARE_PROVIDER_SITE_OTHER): Payer: Medicare Other | Admitting: Vascular Surgery

## 2013-10-23 VITALS — BP 124/73 | HR 87 | Resp 14 | Ht 63.5 in | Wt 178.0 lb

## 2013-10-23 DIAGNOSIS — I83893 Varicose veins of bilateral lower extremities with other complications: Secondary | ICD-10-CM

## 2013-10-23 NOTE — Progress Notes (Signed)
Patient presents today for followup of her left leg laser ablation. She had similar treatment to her right leg several weeks earlier. She has done quite well with minimal discomfort associated with this and mild bruising. She has been compliant with her compression garments  Past Medical History  Diagnosis Date  . Hypothyroidism   . Hypertension   . Arthritis   . DVT (deep venous thrombosis)   . Family history of blood clots   . Peripheral vascular disease   . Varicose veins     History  Substance Use Topics  . Smoking status: Former Smoker -- 0.25 packs/day for 13 years    Types: Cigarettes    Quit date: 06/22/1964  . Smokeless tobacco: Never Used  . Alcohol Use: No    Family History  Problem Relation Age of Onset  . Heart disease Mother   . Cancer Mother   . Varicose Veins Mother   . Deep vein thrombosis Mother   . Diabetes Mother   . Hypertension Mother   . Heart disease Father   . Varicose Veins Sister   . Cancer Sister   . Diabetes Sister   . Heart disease Sister     before age 75  . Hypertension Sister   . Vision loss Sister   . Diabetes Brother   . Vision loss Brother   . Peripheral vascular disease Brother   . AAA (abdominal aortic aneurysm) Brother   . Diabetes Sister   . Heart disease Sister   . Hypertension Sister   . Vision loss Sister   . Diabetes Sister   . Vision loss Sister     Allergies  Allergen Reactions  . Codeine Nausea And Vomiting  . Darvocet [Propoxyphene N-Acetaminophen] Nausea And Vomiting  . Nickel Rash  . Sulfa Antibiotics Rash    Like a sunburn    Current outpatient prescriptions:aspirin EC 81 MG tablet, Take 81 mg by mouth daily after breakfast., Disp: , Rfl: ;  calcium carbonate (OS-CAL) 600 MG TABS tablet, Take 600 mg by mouth 2 (two) times daily with a meal., Disp: , Rfl: ;  Calcium Citrate-Vitamin D 500-400 MG-UNIT CHEW, Chew 1 tablet by mouth at bedtime and may repeat dose one time if needed., Disp: , Rfl:  Cholecalciferol  (VITAMIN D-3) 1000 UNITS CAPS, Take 1 capsule by mouth daily., Disp: , Rfl: ;  clindamycin (CLEOCIN) 300 MG capsule, Take 1 capsule (300 mg total) by mouth 2 (two) times daily., Disp: 14 capsule, Rfl: 0;  levothyroxine (SYNTHROID, LEVOTHROID) 50 MCG tablet, Take 50 mcg by mouth daily before breakfast., Disp: , Rfl: ;  losartan-hydrochlorothiazide (HYZAAR) 100-25 MG per tablet, Take 1 tablet by mouth at bedtime., Disp: , Rfl:  Multiple Vitamin (MULITIVITAMIN WITH MINERALS) TABS, Take 1 tablet by mouth daily., Disp: , Rfl: ;  pyridOXINE (VITAMIN B-6) 100 MG tablet, Take 100 mg by mouth daily., Disp: , Rfl: ;  simvastatin (ZOCOR) 20 MG tablet, Take 20 mg by mouth at bedtime., Disp: , Rfl: ;  vitamin B-12 (CYANOCOBALAMIN) 1000 MCG tablet, Take 1,000 mcg by mouth daily., Disp: , Rfl:  vitamin C (ASCORBIC ACID) 500 MG tablet, Take 500 mg by mouth 2 (two) times daily., Disp: , Rfl: ;  VITAMIN E PO, Take 1 capsule by mouth daily., Disp: , Rfl:   BP 124/73  Pulse 87  Resp 14  Ht 5' 3.5" (1.613 m)  Wt 178 lb (80.74 kg)  BMI 31.03 kg/m2  Body mass index is 31.03 kg/(m^2).  Physical exam she does have bruising over the medial thigh. This is on the left. She's had complete resolution of any bruising on the right. She does have scattered varicosities over both legs with no evidence of thrombus.  She underwent left leg venous duplex today and I have reviewed this with her. This shows a closure of her left great saphenous vein from just below the knee in the proximal calf insertion site to just below the saphenofemoral junction. There is no evidence of DVT  Impression and plan: Stable treatment with bilateral staged ablation of her great saphenous vein. She will continue her compression garment for one additional week of her left middle as-needed basis. She has been quite diligent with lifelong use of her panty style compression and will continue this. She will see [place] on an as-needed basis

## 2014-05-25 ENCOUNTER — Encounter: Payer: Self-pay | Admitting: *Deleted

## 2014-06-23 ENCOUNTER — Other Ambulatory Visit: Payer: Self-pay

## 2014-06-23 DIAGNOSIS — Z1231 Encounter for screening mammogram for malignant neoplasm of breast: Secondary | ICD-10-CM

## 2014-07-15 ENCOUNTER — Ambulatory Visit: Payer: Medicare Other

## 2014-07-21 ENCOUNTER — Ambulatory Visit: Payer: Medicare Other

## 2014-07-28 ENCOUNTER — Ambulatory Visit
Admission: RE | Admit: 2014-07-28 | Discharge: 2014-07-28 | Disposition: A | Discharge status: Home / Self Care | Payer: Medicare Other | Source: Ambulatory Visit

## 2014-07-28 DIAGNOSIS — Z1231 Encounter for screening mammogram for malignant neoplasm of breast: Secondary | ICD-10-CM

## 2014-07-29 ENCOUNTER — Other Ambulatory Visit: Payer: Self-pay | Admitting: Surgical

## 2014-08-31 NOTE — Patient Instructions (Signed)
Amber [name]  08/31/2014   Your procedure is scheduled on: 09/09/2014     [facility] and follow signs to               Short Stay Center at      0630 AM.  Call this number if you have problems the morning of surgery [phone]   Remember:  Do not eat food or drink liquids :After Midnight.     Take these medicines the morning of surgery with A SIP OF WATER:  Synthroid                                You may not have any metal on your body including hair pins and              piercings  Do not wear jewelry, make-up, lotions, powders or perfumes., deodorant.               Do not wear nail polish.  Do not shave  48 hours prior to surgery.     Do not bring valuables to the hospital. [facility] IS NOT             RESPONSIBLE   FOR VALUABLES.  Contacts, dentures or bridgework may not be worn into surgery.  Leave suitcase in the car. After surgery it may be brought to your room.         Special Instructions:coughing and deep breathing exercises, leg exercises               Please read over the following fact sheets you were given: _____________________________________________________________________             [facility] Before surgery, you can play an important role.  Because skin is not sterile, your skin needs to be as free of germs as possible.  You can reduce the number of germs on your skin by washing with CHG (chlorahexidine gluconate) soap before surgery.  CHG is an antiseptic cleaner which kills germs and bonds with the skin to continue killing germs even after washing. Please DO NOT use if you have an allergy to CHG or antibacterial soaps.  If your skin becomes reddened/irritated stop using the CHG and inform your nurse when you arrive at Short Stay. Do not shave (including legs and underarms) for at least 48 hours prior to the first CHG shower.  You may shave your face/neck. Please follow these  instructions carefully:  1.  Shower with CHG Soap the night before surgery and the  morning of Surgery.  2.  If you choose to wash your hair, wash your hair first as usual with your  normal  shampoo.  3.  After you shampoo, rinse your hair and body thoroughly to remove the  shampoo.                           4.  Use CHG as you would any other liquid soap.  You can apply chg directly  to the skin and wash                       Gently with a scrungie or clean washcloth.  5.  Apply the CHG Soap to your body ONLY  FROM THE NECK DOWN.   Do not use on face/ open                           Wound or open sores. Avoid contact with eyes, ears mouth and genitals (private parts).                       Wash face,  Genitals (private parts) with your normal soap.             6.  Wash thoroughly, paying special attention to the area where your surgery  will be performed.  7.  Thoroughly rinse your body with warm water from the neck down.  8.  DO NOT shower/wash with your normal soap after using and rinsing off  the CHG Soap.                9.  Pat yourself dry with a clean towel.            10.  Wear clean pajamas.            11.  Place clean sheets on your bed the night of your first shower and do not  sleep with pets. Day of Surgery : Do not apply any lotions/deodorants the morning of surgery.  Please wear clean clothes to the hospital/surgery center.  FAILURE TO FOLLOW THESE INSTRUCTIONS MAY RESULT IN THE CANCELLATION OF YOUR SURGERY PATIENT SIGNATURE_________________________________  NURSE SIGNATURE__________________________________  ________________________________________________________________________  WHAT IS A BLOOD TRANSFUSION? Blood Transfusion Information  A transfusion is the replacement of blood or some of its parts. Blood is made up of multiple cells which provide different functions.  Red blood cells carry oxygen and are used for blood loss replacement.  White blood cells fight against  infection.  Platelets control bleeding.  Plasma helps clot blood.  Other blood products are available for specialized needs, such as hemophilia or other clotting disorders. BEFORE THE TRANSFUSION  Who gives blood for transfusions?   Healthy volunteers who are fully evaluated to make sure their blood is safe. This is blood bank blood. Transfusion therapy is the safest it has ever been in the practice of medicine. Before blood is taken from a donor, a complete history is taken to make sure that person has no history of diseases nor engages in risky social behavior (examples are intravenous drug use or sexual activity with multiple partners). The donor's travel history is screened to minimize risk of transmitting infections, such as malaria. The donated blood is tested for signs of infectious diseases, such as HIV and hepatitis. The blood is then tested to be sure it is compatible with you in order to minimize the chance of a transfusion reaction. If you or a relative donates blood, this is often done in anticipation of surgery and is not appropriate for emergency situations. It takes many days to process the donated blood. RISKS AND COMPLICATIONS Although transfusion therapy is very safe and saves many lives, the main dangers of transfusion include:  1. Getting an infectious disease. 2. Developing a transfusion reaction. This is an allergic reaction to something in the blood you were given. Every precaution is taken to prevent this. The decision to have a blood transfusion has been considered carefully by your caregiver before blood is given. Blood is not given unless the benefits outweigh the risks. AFTER THE TRANSFUSION  Right after receiving a blood transfusion, you will usually feel much better  and more energetic. This is especially true if your red blood cells have gotten low (anemic). The transfusion raises the level of the red blood cells which carry oxygen, and this usually causes an energy  increase.  The nurse administering the transfusion will monitor you carefully for complications. HOME CARE INSTRUCTIONS  No special instructions are needed after a transfusion. You may find your energy is better. Speak with your caregiver about any limitations on activity for underlying diseases you may have. SEEK MEDICAL CARE IF:   Your condition is not improving after your transfusion.  You develop redness or irritation at the intravenous (IV) site. SEEK IMMEDIATE MEDICAL CARE IF:  Any of the following symptoms occur over the next 12 hours:  Shaking chills.  You have a temperature by mouth above 102 F (38.9 C), not controlled by medicine.  Chest, back, or muscle pain.  People around you feel you are not acting correctly or are confused.  Shortness of breath or difficulty breathing.  Dizziness and fainting.  You get a rash or develop hives.  You have a decrease in urine output.  Your urine turns a dark color or changes to pink, red, or brown. Any of the following symptoms occur over the next 10 days:  You have a temperature by mouth above 102 F (38.9 C), not controlled by medicine.  Shortness of breath.  Weakness after normal activity.  The white part of the eye turns yellow (jaundice).  You have a decrease in the amount of urine or are urinating less often.  Your urine turns a dark color or changes to pink, red, or brown. Document Released: 05/12/2000 Document Revised: 08/07/2011 Document Reviewed: 12/30/2007 Exit[employer] Patient Information 2014 [place].  _______________________________________________________________________  Incentive Spirometer  An incentive spirometer is a tool that can help keep your lungs clear and active. This tool measures how well you are filling your lungs with each breath. Taking long deep breaths may help reverse or decrease the chance of developing breathing (pulmonary) problems (especially infection) following:  A long  period of time when you are unable to move or be active. BEFORE THE PROCEDURE   If the spirometer includes an indicator to show your best effort, your nurse or respiratory therapist will set it to a desired goal.  If possible, sit up straight or lean slightly forward. Try not to slouch.  Hold the incentive spirometer in an upright position. INSTRUCTIONS FOR USE  3. Sit on the edge of your bed if possible, or sit up as far as you can in bed or on a chair. 4. Hold the incentive spirometer in an upright position. 5. Breathe out normally. 6. Place the mouthpiece in your mouth and seal your lips tightly around it. 7. Breathe in slowly and as deeply as possible, raising the piston or the ball toward the top of the column. 8. Hold your breath for 3-5 seconds or for as long as possible. Allow the piston or ball to fall to the bottom of the column. 9. Remove the mouthpiece from your mouth and breathe out normally. 10. Rest for a few seconds and repeat Steps 1 through 7 at least 10 times every 1-2 hours when you are awake. Take your time and take a few normal breaths between deep breaths. 11. The spirometer may include an indicator to show your best effort. Use the indicator as a goal to work toward during each repetition. 12. After each set of 10 deep breaths, practice coughing to be sure your  lungs are clear. If you have an incision (the cut made at the time of surgery), support your incision when coughing by placing a pillow or rolled up towels firmly against it. Once you are able to get out of bed, walk around indoors and cough well. You may stop using the incentive spirometer when instructed by your caregiver.  RISKS AND COMPLICATIONS  Take your time so you do not get dizzy or light-headed.  If you are in pain, you may need to take or ask for pain medication before doing incentive spirometry. It is harder to take a deep breath if you are having pain. AFTER USE  Rest and breathe slowly and  easily.  It can be helpful to keep track of a log of your progress. Your caregiver can provide you with a simple table to help with this. If you are using the spirometer at home, follow these instructions: [place] IF:   You are having difficultly using the spirometer.  You have trouble using the spirometer as often as instructed.  Your pain medication is not giving enough relief while using the spirometer.  You develop fever of 100.5 F (38.1 C) or higher. SEEK IMMEDIATE MEDICAL CARE IF:   You cough up bloody sputum that had not been present before.  You develop fever of 102 F (38.9 C) or greater.  You develop worsening pain at or near the incision site. MAKE SURE YOU:   Understand these instructions.  Will watch your condition.  Will get help right away if you are not doing well or get worse. Document Released: 09/25/2006 Document Revised: 08/07/2011 Document Reviewed: 11/26/2006 [facility] Patient Information 2014 [place], [place].   ________________________________________________________________________

## 2014-09-01 ENCOUNTER — Encounter (HOSPITAL_COMMUNITY): Payer: Self-pay

## 2014-09-01 ENCOUNTER — Encounter (HOSPITAL_COMMUNITY)
Admission: RE | Admit: 2014-09-01 | Discharge: 2014-09-01 | Disposition: A | Discharge status: Home / Self Care | Payer: Medicare Other | Source: Ambulatory Visit | Attending: Orthopedic Surgery | Admitting: Orthopedic Surgery

## 2014-09-01 ENCOUNTER — Ambulatory Visit (HOSPITAL_COMMUNITY)
Admission: RE | Admit: 2014-09-01 | Discharge: 2014-09-01 | Disposition: A | Discharge status: Home / Self Care | Payer: Medicare Other | Source: Ambulatory Visit | Attending: Surgical | Admitting: Surgical

## 2014-09-01 DIAGNOSIS — Z01812 Encounter for preprocedural laboratory examination: Secondary | ICD-10-CM | POA: Insufficient documentation

## 2014-09-01 DIAGNOSIS — Z01810 Encounter for preprocedural cardiovascular examination: Secondary | ICD-10-CM | POA: Insufficient documentation

## 2014-09-01 DIAGNOSIS — Z01818 Encounter for other preprocedural examination: Secondary | ICD-10-CM

## 2014-09-01 HISTORY — DX: Left bundle-branch block, unspecified: I44.7

## 2014-09-01 LAB — URINALYSIS, ROUTINE W REFLEX MICROSCOPIC
Bilirubin Urine: NEGATIVE
Glucose, UA: NEGATIVE mg/dL
Hgb urine dipstick: NEGATIVE
Ketones, ur: NEGATIVE mg/dL
Nitrite: NEGATIVE
Protein, ur: NEGATIVE mg/dL
Specific Gravity, Urine: 1.025 (ref 1.005–1.030)
Urobilinogen, UA: 0.2 mg/dL (ref 0.0–1.0)
pH: 5.5 (ref 5.0–8.0)

## 2014-09-01 LAB — SURGICAL PCR SCREEN
MRSA, PCR: NEGATIVE
Staphylococcus aureus: NEGATIVE

## 2014-09-01 LAB — CBC WITH DIFFERENTIAL/PLATELET
Basophils Absolute: 0 10*3/uL (ref 0.0–0.1)
Basophils Relative: 0 % (ref 0–1)
Eosinophils Absolute: 0.1 10*3/uL (ref 0.0–0.7)
Eosinophils Relative: 1 % (ref 0–5)
HCT: 40.8 % (ref 36.0–46.0)
Hemoglobin: 13.6 g/dL (ref 12.0–15.0)
Lymphocytes Relative: 26 % (ref 12–46)
Lymphs Abs: 2 10*3/uL (ref 0.7–4.0)
MCH: 30.8 pg (ref 26.0–34.0)
MCHC: 33.3 g/dL (ref 30.0–36.0)
MCV: 92.3 fL (ref 78.0–100.0)
Monocytes Absolute: 0.5 10*3/uL (ref 0.1–1.0)
Monocytes Relative: 6 % (ref 3–12)
Neutro Abs: 5.1 10*3/uL (ref 1.7–7.7)
Neutrophils Relative %: 67 % (ref 43–77)
Platelets: 196 10*3/uL (ref 150–400)
RBC: 4.42 MIL/uL (ref 3.87–5.11)
RDW: 14 % (ref 11.5–15.5)
WBC: 7.7 10*3/uL (ref 4.0–10.5)

## 2014-09-01 LAB — COMPREHENSIVE METABOLIC PANEL
ALT: 19 U/L (ref 0–35)
AST: 24 U/L (ref 0–37)
Albumin: 4.1 g/dL (ref 3.5–5.2)
Alkaline Phosphatase: 68 U/L (ref 39–117)
Anion gap: 11 (ref 5–15)
BUN: 17 mg/dL (ref 6–23)
CO2: 29 mmol/L (ref 19–32)
Calcium: 9.3 mg/dL (ref 8.4–10.5)
Chloride: 98 mmol/L (ref 96–112)
Creatinine, Ser: 0.65 mg/dL (ref 0.50–1.10)
GFR calc Af Amer: >90 mL/min (ref >90)
GFR calc non Af Amer: 84 mL/min — ABNORMAL LOW (ref >90)
Glucose, Bld: 195 mg/dL — ABNORMAL HIGH (ref 70–99)
Potassium: 3.4 mmol/L — ABNORMAL LOW (ref 3.5–5.1)
Sodium: 138 mmol/L (ref 135–145)
Total Bilirubin: 0.7 mg/dL (ref 0.3–1.2)
Total Protein: 6.8 g/dL (ref 6.0–8.3)

## 2014-09-01 LAB — URINE MICROSCOPIC-ADD ON

## 2014-09-01 LAB — PROTIME-INR
INR: 1.09 (ref 0.00–1.49)
Prothrombin Time: 14.3 seconds (ref 11.6–15.2)

## 2014-09-01 LAB — APTT: aPTT: 37 seconds (ref 24–37)

## 2014-09-01 NOTE — Progress Notes (Signed)
U/A with micro results done 09/01/2014 faxed via EPIC to Dr Darrelyn HillockGioffre.

## 2014-09-01 NOTE — Progress Notes (Signed)
Final EKG in EPIC done 09/01/2014.

## 2014-09-01 NOTE — H&P (Signed)
TOTAL KNEE ADMISSION H&P  Patient is being admitted for right total knee arthroplasty.  Subjective:  Chief Complaint:right knee pain.  HPI: Amber [name], 74 y.o. female, has a history of pain and functional disability in the right knee due to arthritis and has failed non-surgical conservative treatments for greater than 12 weeks to includeNSAID's and/or analgesics, corticosteriod injections and activity modification.  Onset of symptoms was gradual, starting 3 years ago with gradually worsening course since that time. The patient noted prior procedures on the knee to include  arthroscopy and menisectomy on the right knee(s).  Patient currently rates pain in the right knee(s) at 7 out of 10 with activity. Patient has night pain, worsening of pain with activity and weight bearing, pain that interferes with activities of daily living, pain with passive range of motion, crepitus and joint swelling.  Patient has evidence of periarticular osteophytes and joint space narrowing by imaging studies.  There is no active infection.  Patient Active Problem List   Diagnosis Date Noted  . Swelling of limb 09/02/2013  . Peripheral vascular disease, unspecified 04/18/2013  . Chronic venous insufficiency 04/18/2013  . Varicose veins of lower extremities with other complications 04/18/2013  . Post-phlebitic syndrome 04/18/2013  . Rotator cuff rupture, complete 07/05/2011   Past Medical History  Diagnosis Date  . Hypothyroidism   . Hypertension   . Arthritis   . DVT (deep venous thrombosis)   . Family history of blood clots   . Peripheral vascular disease   . Varicose veins   . Left bundle branch block     patient reports history of for last several years     Past Surgical History  Procedure Laterality Date  . Appendectomy  1979    when did hysterectomy  . Abdominal hysterectomy  1979    partial done  . Back surgery    . Shoulder open rotator cuff repair  07/05/2011    Procedure: ROTATOR CUFF REPAIR  SHOULDER OPEN;  Surgeon: Jacki Cones, MD;  Location: [facility];  Service: Orthopedics;  Laterality: Right;  . Endovenous ablation saphenous vein w/ laser Right 09-15-2013    right greater saphenous vein by Gretta Began MD  . Endovenous ablation saphenous vein w/ laser Left 10-16-2013    endovenous laser ablation left greater saphenous vein by Gretta Began MD  . Left open rotator cuff surgery   2010      Current outpatient prescriptions:  .  acetaminophen (TYLENOL) 500 MG tablet, Take 1,000 mg by mouth at bedtime., Disp: , Rfl:  .  aspirin EC 81 MG tablet, Take 81 mg by mouth at bedtime. , Disp: , Rfl:  .  Calcium Citrate-Vitamin D 500-400 MG-UNIT CHEW, Chew 1 tablet by mouth at bedtime. , Disp: , Rfl:  .  Cholecalciferol (VITAMIN D-3) 1000 UNITS CAPS, Take 1 capsule by mouth at bedtime. , Disp: , Rfl:  .  diphenhydrAMINE (BENADRYL) 25 mg capsule, Take 25 mg by mouth at bedtime., Disp: , Rfl:  .  levothyroxine (SYNTHROID, LEVOTHROID) 50 MCG tablet, Take 50 mcg by mouth daily before breakfast., Disp: , Rfl:  .  losartan-hydrochlorothiazide (HYZAAR) 100-25 MG per tablet, Take 1 tablet by mouth every morning. , Disp: , Rfl:  .  meloxicam (MOBIC) 15 MG tablet, Take 15 mg by mouth every morning., Disp: , Rfl:  .  methocarbamol (ROBAXIN) 500 MG tablet, Take 250 mg by mouth daily as needed for muscle spasms., Disp: , Rfl:  .  pyridOXINE (VITAMIN B-6) 100  MG tablet, Take 100 mg by mouth every morning. , Disp: , Rfl:  .  simvastatin (ZOCOR) 20 MG tablet, Take 20 mg by mouth every morning. , Disp: , Rfl:  .  vitamin B-12 (CYANOCOBALAMIN) 1000 MCG tablet, Take 1,000 mcg by mouth every morning. , Disp: , Rfl:  .  vitamin C (ASCORBIC ACID) 500 MG tablet, Take 500 mg by mouth every morning. , Disp: , Rfl:  .  VITAMIN E PO, Take 1 capsule by mouth every morning. , Disp: , Rfl:    Allergies  Allergen Reactions  . Codeine Nausea And Vomiting  . Darvocet [Propoxyphene N-Acetaminophen] Nausea And Vomiting  .  Nickel Rash  . Sulfa Antibiotics Rash    Like a sunburn    History  Substance Use Topics  . Smoking status: Former Smoker -- 0.25 packs/day for 13 years    Types: Cigarettes    Quit date: 06/22/1964  . Smokeless tobacco: Never Used  . Alcohol Use: No    Family History  Problem Relation Age of Onset  . Heart disease Mother   . Cancer Mother   . Varicose Veins Mother   . Deep vein thrombosis Mother   . Diabetes Mother   . Hypertension Mother   . Heart disease Father   . Varicose Veins Sister   . Cancer Sister   . Diabetes Sister   . Heart disease Sister     before age 76  . Hypertension Sister   . Vision loss Sister   . Diabetes Brother   . Vision loss Brother   . Peripheral vascular disease Brother   . AAA (abdominal aortic aneurysm) Brother   . Diabetes Sister   . Heart disease Sister   . Hypertension Sister   . Vision loss Sister   . Diabetes Sister   . Vision loss Sister      Review of Systems  Constitutional: Negative.   HENT: Negative.   Eyes: Negative.   Respiratory: Positive for shortness of breath. Negative for cough, hemoptysis, sputum production and wheezing.        Right knee pain  Cardiovascular: Negative.   Gastrointestinal: Negative.   Genitourinary: Positive for frequency. Negative for dysuria, urgency, hematuria and flank pain.  Musculoskeletal: Positive for joint pain. Negative for myalgias, back pain, falls and neck pain.       Right knee pain  Skin: Negative.   Neurological: Negative.   Endo/Heme/Allergies: Negative.   Psychiatric/Behavioral: Negative.     Objective:  Physical Exam  Constitutional: She is oriented to person, place, and time. She appears well-developed and well-nourished. No distress.  HENT:  Head: Normocephalic and atraumatic.  Right Ear: External ear normal.  Left Ear: External ear normal.  Nose: Nose normal.  Mouth/Throat: Oropharynx is clear and moist.  Eyes: Conjunctivae and EOM are normal.  Neck: Normal range  of motion. Neck supple.  Cardiovascular: Normal rate, regular rhythm, normal heart sounds and intact distal pulses.   No murmur heard. Respiratory: Effort normal and breath sounds normal. No respiratory distress. She has no wheezes.  GI: Soft. Bowel sounds are normal. She exhibits no distension. There is no tenderness.  Musculoskeletal:       Right hip: Normal.       Left hip: Normal.       Right knee: She exhibits decreased range of motion and swelling. She exhibits no effusion and no erythema. Tenderness found. Medial joint line and lateral joint line tenderness noted.  Left knee: Normal.  Neurological: She is alert and oriented to person, place, and time. She has normal strength and normal reflexes. No sensory deficit.  Skin: No rash noted. She is not diaphoretic. No erythema.  Psychiatric: She has a normal mood and affect. Her behavior is normal.    Vital signs in last 24 hours: Temp:  [97.3 F (36.3 C)] 97.3 F (36.3 C) (04/05 1157) Pulse Rate:  [89] 89 (04/05 1157) Resp:  [16] 16 (04/05 1157) BP: (129)/(52) 129/52 mmHg (04/05 1157) SpO2:  [99 %] 99 % (04/05 1157) Weight:  [68.04 kg (150 lb)] 68.04 kg (150 lb) (04/05 1157)    Imaging Review Plain radiographs demonstrate severe degenerative joint disease of the right knee(s). The overall alignment ismild valgus. The bone quality appears to be good for age and reported activity level.  Assessment/Plan:  End stage primary osteoarthritis, right knee   The patient history, physical examination, clinical judgment of the provider and imaging studies are consistent with end stage degenerative joint disease of the right knee(s) and total knee arthroplasty is deemed medically necessary. The treatment options including medical management, injection therapy arthroscopy and arthroplasty were discussed at length. The risks and benefits of total knee arthroplasty were presented and reviewed. The risks due to aseptic loosening, infection,  stiffness, patella tracking problems, thromboembolic complications and other imponderables were discussed. The patient acknowledged the explanation, agreed to proceed with the plan and consent was signed. Patient is being admitted for inpatient treatment for surgery, pain control, PT, OT, prophylactic antibiotics, VTE prophylaxis, progressive ambulation and ADL's and discharge planning. The patient is planning to be discharged home with home health services   TXA IV PCP: Dr. Marjory Lies    Dimitri Ped, PA-C

## 2014-09-08 ENCOUNTER — Encounter (HOSPITAL_COMMUNITY): Payer: Self-pay | Admitting: Anesthesiology

## 2014-09-09 ENCOUNTER — Encounter (HOSPITAL_COMMUNITY): Payer: Self-pay | Admitting: *Deleted

## 2014-09-09 ENCOUNTER — Inpatient Hospital Stay (HOSPITAL_COMMUNITY)
Admission: RE | Admit: 2014-09-09 | Discharge: 2014-09-12 | DRG: 470 | Disposition: A | Discharge status: Skilled Nursing Facility | Payer: Medicare Other | Source: Ambulatory Visit | Attending: Orthopedic Surgery | Admitting: Orthopedic Surgery

## 2014-09-09 ENCOUNTER — Inpatient Hospital Stay (HOSPITAL_COMMUNITY): Payer: Medicare Other | Admitting: Anesthesiology

## 2014-09-09 ENCOUNTER — Encounter (HOSPITAL_COMMUNITY)
Admission: RE | Disposition: A | Discharge status: Skilled Nursing Facility | Payer: Self-pay | Source: Ambulatory Visit | Attending: Orthopedic Surgery

## 2014-09-09 DIAGNOSIS — Z886 Allergy status to analgesic agent status: Secondary | ICD-10-CM | POA: Diagnosis not present

## 2014-09-09 DIAGNOSIS — Z882 Allergy status to sulfonamides status: Secondary | ICD-10-CM

## 2014-09-09 DIAGNOSIS — Z87891 Personal history of nicotine dependence: Secondary | ICD-10-CM | POA: Diagnosis not present

## 2014-09-09 DIAGNOSIS — Z888 Allergy status to other drugs, medicaments and biological substances status: Secondary | ICD-10-CM

## 2014-09-09 DIAGNOSIS — I447 Left bundle-branch block, unspecified: Secondary | ICD-10-CM | POA: Diagnosis not present

## 2014-09-09 DIAGNOSIS — Z90710 Acquired absence of both cervix and uterus: Secondary | ICD-10-CM

## 2014-09-09 DIAGNOSIS — I739 Peripheral vascular disease, unspecified: Secondary | ICD-10-CM | POA: Diagnosis present

## 2014-09-09 DIAGNOSIS — I10 Essential (primary) hypertension: Secondary | ICD-10-CM | POA: Diagnosis present

## 2014-09-09 DIAGNOSIS — R0902 Hypoxemia: Secondary | ICD-10-CM

## 2014-09-09 DIAGNOSIS — M24561 Contracture, right knee: Secondary | ICD-10-CM | POA: Diagnosis present

## 2014-09-09 DIAGNOSIS — R112 Nausea with vomiting, unspecified: Secondary | ICD-10-CM | POA: Diagnosis not present

## 2014-09-09 DIAGNOSIS — I872 Venous insufficiency (chronic) (peripheral): Secondary | ICD-10-CM | POA: Diagnosis present

## 2014-09-09 DIAGNOSIS — Z96659 Presence of unspecified artificial knee joint: Secondary | ICD-10-CM

## 2014-09-09 DIAGNOSIS — M1711 Unilateral primary osteoarthritis, right knee: Principal | ICD-10-CM | POA: Diagnosis present

## 2014-09-09 DIAGNOSIS — E039 Hypothyroidism, unspecified: Secondary | ICD-10-CM | POA: Diagnosis present

## 2014-09-09 DIAGNOSIS — I8393 Asymptomatic varicose veins of bilateral lower extremities: Secondary | ICD-10-CM | POA: Diagnosis present

## 2014-09-09 DIAGNOSIS — M25561 Pain in right knee: Secondary | ICD-10-CM | POA: Diagnosis present

## 2014-09-09 DIAGNOSIS — Z86718 Personal history of other venous thrombosis and embolism: Secondary | ICD-10-CM

## 2014-09-09 HISTORY — PX: TOTAL KNEE ARTHROPLASTY: SHX125

## 2014-09-09 LAB — TYPE AND SCREEN
ABO/RH(D): O POS
Antibody Screen: NEGATIVE

## 2014-09-09 SURGERY — ARTHROPLASTY, KNEE, TOTAL
Anesthesia: General | Site: Knee | Laterality: Right

## 2014-09-09 MED ORDER — CEFAZOLIN SODIUM-DEXTROSE 2-3 GM-% IV SOLR
2.0000 g | INTRAVENOUS | Status: AC
  Administered 2014-09-09: 2 g via INTRAVENOUS

## 2014-09-09 MED ORDER — METHOCARBAMOL 500 MG PO TABS
500.0000 mg | ORAL_TABLET | Freq: Four times a day (QID) | ORAL | Status: DC | PRN
  Administered 2014-09-09 – 2014-09-12 (×7): 500 mg via ORAL
  Filled 2014-09-09 (×7): qty 1

## 2014-09-09 MED ORDER — BUPIVACAINE-EPINEPHRINE (PF) 0.25% -1:200000 IJ SOLN
INTRAMUSCULAR | Status: AC
  Filled 2014-09-09: qty 30

## 2014-09-09 MED ORDER — POLYETHYLENE GLYCOL 3350 17 G PO PACK: 17.0000 g | PACK | Freq: Every day | ORAL | Status: DC | PRN

## 2014-09-09 MED ORDER — ONDANSETRON HCL 4 MG/2ML IJ SOLN
4.0000 mg | Freq: Four times a day (QID) | INTRAMUSCULAR | Status: DC | PRN
  Administered 2014-09-10: 4 mg via INTRAVENOUS
  Filled 2014-09-09: qty 2

## 2014-09-09 MED ORDER — CEFAZOLIN SODIUM-DEXTROSE 2-3 GM-% IV SOLR
INTRAVENOUS | Status: AC
  Filled 2014-09-09: qty 50

## 2014-09-09 MED ORDER — HYDROMORPHONE HCL 1 MG/ML IJ SOLN
1.0000 mg | INTRAMUSCULAR | Status: DC | PRN
  Administered 2014-09-10: 1 mg via INTRAVENOUS
  Filled 2014-09-09: qty 1

## 2014-09-09 MED ORDER — KETOROLAC TROMETHAMINE 30 MG/ML IJ SOLN
INTRAMUSCULAR | Status: AC
  Filled 2014-09-09: qty 1

## 2014-09-09 MED ORDER — SODIUM CHLORIDE 0.9 % IR SOLN
Status: DC | PRN
  Administered 2014-09-09 (×2): 1000 mL

## 2014-09-09 MED ORDER — RIVAROXABAN 10 MG PO TABS
10.0000 mg | ORAL_TABLET | Freq: Every day | ORAL | Status: DC
  Administered 2014-09-10 – 2014-09-12 (×3): 10 mg via ORAL
  Filled 2014-09-09 (×4): qty 1

## 2014-09-09 MED ORDER — SODIUM CHLORIDE 0.9 % IR SOLN
Status: AC
  Filled 2014-09-09: qty 1

## 2014-09-09 MED ORDER — HYDROMORPHONE HCL 1 MG/ML IJ SOLN
INTRAMUSCULAR | Status: DC | PRN
  Administered 2014-09-09 (×2): 0.5 mg via INTRAVENOUS
  Administered 2014-09-09: 1 mg via INTRAVENOUS

## 2014-09-09 MED ORDER — FLEET ENEMA 7-19 GM/118ML RE ENEM: 1.0000 | ENEMA | Freq: Once | RECTAL | Status: AC | PRN

## 2014-09-09 MED ORDER — DEXAMETHASONE SODIUM PHOSPHATE 10 MG/ML IJ SOLN
INTRAMUSCULAR | Status: AC
  Filled 2014-09-09: qty 1

## 2014-09-09 MED ORDER — ACETAMINOPHEN 650 MG RE SUPP: 650.0000 mg | Freq: Four times a day (QID) | RECTAL | Status: DC | PRN

## 2014-09-09 MED ORDER — MENTHOL 3 MG MT LOZG
1.0000 | LOZENGE | OROMUCOSAL | Status: DC | PRN
Start: 2014-09-09 — End: 2014-09-12

## 2014-09-09 MED ORDER — FERROUS SULFATE 325 (65 FE) MG PO TABS
325.0000 mg | ORAL_TABLET | Freq: Three times a day (TID) | ORAL | Status: DC
  Administered 2014-09-10 – 2014-09-12 (×5): 325 mg via ORAL
  Filled 2014-09-09 (×11): qty 1

## 2014-09-09 MED ORDER — LACTATED RINGERS IV SOLN
INTRAVENOUS | Status: DC
  Administered 2014-09-09 (×2): via INTRAVENOUS

## 2014-09-09 MED ORDER — PROPOFOL 10 MG/ML IV BOLUS
INTRAVENOUS | Status: DC | PRN
  Administered 2014-09-09: 50 mg via INTRAVENOUS
  Administered 2014-09-09: 100 mg via INTRAVENOUS

## 2014-09-09 MED ORDER — LOSARTAN POTASSIUM-HCTZ 100-25 MG PO TABS: 1.0000 | ORAL_TABLET | Freq: Every morning | ORAL | Status: DC

## 2014-09-09 MED ORDER — THROMBIN 5000 UNITS EX SOLR
CUTANEOUS | Status: AC
  Filled 2014-09-09: qty 5000

## 2014-09-09 MED ORDER — ONDANSETRON HCL 4 MG/2ML IJ SOLN
INTRAMUSCULAR | Status: AC
  Filled 2014-09-09: qty 2

## 2014-09-09 MED ORDER — ACETAMINOPHEN 325 MG PO TABS: 650.0000 mg | ORAL_TABLET | Freq: Four times a day (QID) | ORAL | Status: DC | PRN

## 2014-09-09 MED ORDER — FENTANYL CITRATE 0.05 MG/ML IJ SOLN
INTRAMUSCULAR | Status: DC | PRN
  Administered 2014-09-09: 50 ug via INTRAVENOUS
  Administered 2014-09-09: 100 ug via INTRAVENOUS
  Administered 2014-09-09 (×2): 50 ug via INTRAVENOUS

## 2014-09-09 MED ORDER — CHLORHEXIDINE GLUCONATE 4 % EX LIQD: 60.0000 mL | Freq: Once | CUTANEOUS | Status: DC

## 2014-09-09 MED ORDER — HYDROMORPHONE HCL 1 MG/ML IJ SOLN
0.2500 mg | INTRAMUSCULAR | Status: DC | PRN
  Administered 2014-09-09 (×2): 0.25 mg via INTRAVENOUS
  Administered 2014-09-09: 0.5 mg via INTRAVENOUS
  Administered 2014-09-09 (×2): 0.25 mg via INTRAVENOUS

## 2014-09-09 MED ORDER — METOCLOPRAMIDE HCL 5 MG/ML IJ SOLN
10.0000 mg | Freq: Four times a day (QID) | INTRAMUSCULAR | Status: DC
  Administered 2014-09-09: 10 mg via INTRAVENOUS
  Filled 2014-09-09: qty 2

## 2014-09-09 MED ORDER — FENTANYL CITRATE 0.05 MG/ML IJ SOLN
INTRAMUSCULAR | Status: AC
  Filled 2014-09-09: qty 5

## 2014-09-09 MED ORDER — CEFAZOLIN SODIUM 1-5 GM-% IV SOLN
1.0000 g | Freq: Four times a day (QID) | INTRAVENOUS | Status: AC
  Administered 2014-09-09 (×2): 1 g via INTRAVENOUS
  Filled 2014-09-09 (×2): qty 50

## 2014-09-09 MED ORDER — VITAMIN B-6 100 MG PO TABS
100.0000 mg | ORAL_TABLET | Freq: Every morning | ORAL | Status: DC
  Administered 2014-09-09 – 2014-09-11 (×3): 100 mg via ORAL
  Filled 2014-09-09 (×4): qty 1

## 2014-09-09 MED ORDER — LACTATED RINGERS IV SOLN
INTRAVENOUS | Status: DC
  Administered 2014-09-09: 13:00:00 via INTRAVENOUS
  Administered 2014-09-10: 20 mL/h via INTRAVENOUS

## 2014-09-09 MED ORDER — SIMVASTATIN 20 MG PO TABS
20.0000 mg | ORAL_TABLET | Freq: Every day | ORAL | Status: DC
  Administered 2014-09-10: 20 mg via ORAL
  Filled 2014-09-09 (×4): qty 1

## 2014-09-09 MED ORDER — HYDROCHLOROTHIAZIDE 25 MG PO TABS
25.0000 mg | ORAL_TABLET | Freq: Every day | ORAL | Status: DC
  Administered 2014-09-09 – 2014-09-12 (×4): 25 mg via ORAL
  Filled 2014-09-09 (×4): qty 1

## 2014-09-09 MED ORDER — SUCCINYLCHOLINE CHLORIDE 20 MG/ML IJ SOLN
INTRAMUSCULAR | Status: DC | PRN
  Administered 2014-09-09: 100 mg via INTRAVENOUS

## 2014-09-09 MED ORDER — KETOROLAC TROMETHAMINE 30 MG/ML IJ SOLN
30.0000 mg | Freq: Once | INTRAMUSCULAR | Status: AC
  Administered 2014-09-09: 30 mg via INTRAVENOUS

## 2014-09-09 MED ORDER — BUPIVACAINE LIPOSOME 1.3 % IJ SUSP
20.0000 mL | Freq: Once | INTRAMUSCULAR | Status: AC
  Administered 2014-09-09: 20 mL
  Filled 2014-09-09: qty 20

## 2014-09-09 MED ORDER — ONDANSETRON HCL 4 MG PO TABS
4.0000 mg | ORAL_TABLET | Freq: Four times a day (QID) | ORAL | Status: DC | PRN
  Administered 2014-09-11: 4 mg via ORAL
  Filled 2014-09-09: qty 1

## 2014-09-09 MED ORDER — ONDANSETRON HCL 4 MG/2ML IJ SOLN
INTRAMUSCULAR | Status: DC | PRN
  Administered 2014-09-09: 4 mg via INTRAVENOUS

## 2014-09-09 MED ORDER — DEXAMETHASONE SODIUM PHOSPHATE 10 MG/ML IJ SOLN
INTRAMUSCULAR | Status: DC | PRN
  Administered 2014-09-09: 10 mg via INTRAVENOUS

## 2014-09-09 MED ORDER — ACETAMINOPHEN 10 MG/ML IV SOLN
1000.0000 mg | Freq: Once | INTRAVENOUS | Status: AC
  Administered 2014-09-09: 1000 mg via INTRAVENOUS
  Filled 2014-09-09: qty 100

## 2014-09-09 MED ORDER — PROMETHAZINE HCL 25 MG PO TABS: 12.5000 mg | ORAL_TABLET | Freq: Four times a day (QID) | ORAL | Status: DC | PRN

## 2014-09-09 MED ORDER — ACETAMINOPHEN 325 MG PO TABS
650.0000 mg | ORAL_TABLET | Freq: Four times a day (QID) | ORAL | Status: DC | PRN
  Administered 2014-09-09 – 2014-09-12 (×5): 650 mg via ORAL
  Filled 2014-09-09 (×5): qty 2

## 2014-09-09 MED ORDER — HYDROMORPHONE HCL 2 MG/ML IJ SOLN
INTRAMUSCULAR | Status: AC
  Filled 2014-09-09: qty 1

## 2014-09-09 MED ORDER — BUPIVACAINE-EPINEPHRINE (PF) 0.5% -1:200000 IJ SOLN
INTRAMUSCULAR | Status: DC | PRN
  Administered 2014-09-09: 20 mL via PERINEURAL

## 2014-09-09 MED ORDER — LOSARTAN POTASSIUM 50 MG PO TABS
100.0000 mg | ORAL_TABLET | Freq: Every day | ORAL | Status: DC
  Administered 2014-09-09 – 2014-09-12 (×4): 100 mg via ORAL
  Filled 2014-09-09 (×4): qty 2

## 2014-09-09 MED ORDER — HYDROMORPHONE HCL 2 MG PO TABS
2.0000 mg | ORAL_TABLET | ORAL | Status: DC | PRN
  Administered 2014-09-09 – 2014-09-10 (×5): 2 mg via ORAL
  Filled 2014-09-09 (×5): qty 1

## 2014-09-09 MED ORDER — METHOCARBAMOL 1000 MG/10ML IJ SOLN
500.0000 mg | Freq: Four times a day (QID) | INTRAVENOUS | Status: DC | PRN
  Administered 2014-09-09: 500 mg via INTRAVENOUS
  Filled 2014-09-09 (×2): qty 5

## 2014-09-09 MED ORDER — ALUM & MAG HYDROXIDE-SIMETH 200-200-20 MG/5ML PO SUSP: 30.0000 mL | ORAL | Status: DC | PRN

## 2014-09-09 MED ORDER — PROMETHAZINE HCL 25 MG/ML IJ SOLN
6.2500 mg | Freq: Four times a day (QID) | INTRAMUSCULAR | Status: DC | PRN
  Administered 2014-09-10: 6.25 mg via INTRAVENOUS
  Filled 2014-09-09: qty 1

## 2014-09-09 MED ORDER — SODIUM CHLORIDE 0.9 % IR SOLN
Status: DC | PRN
  Administered 2014-09-09: 500 mL

## 2014-09-09 MED ORDER — ACETAMINOPHEN 10 MG/ML IV SOLN
1000.0000 mg | Freq: Four times a day (QID) | INTRAVENOUS | Status: DC
  Filled 2014-09-09 (×4): qty 100

## 2014-09-09 MED ORDER — SODIUM CHLORIDE 0.9 % IJ SOLN
INTRAMUSCULAR | Status: AC
  Filled 2014-09-09: qty 50

## 2014-09-09 MED ORDER — ONDANSETRON HCL 4 MG/2ML IJ SOLN
4.0000 mg | Freq: Once | INTRAMUSCULAR | Status: DC | PRN
Start: 2014-09-09 — End: 2014-09-09

## 2014-09-09 MED ORDER — LEVOTHYROXINE SODIUM 50 MCG PO TABS
50.0000 ug | ORAL_TABLET | Freq: Every day | ORAL | Status: DC
  Administered 2014-09-10 – 2014-09-12 (×3): 50 ug via ORAL
  Filled 2014-09-09 (×5): qty 1

## 2014-09-09 MED ORDER — PHENOL 1.4 % MT LIQD: 1.0000 | OROMUCOSAL | Status: DC | PRN

## 2014-09-09 MED ORDER — BISACODYL 5 MG PO TBEC
5.0000 mg | DELAYED_RELEASE_TABLET | Freq: Every day | ORAL | Status: DC | PRN
  Filled 2014-09-09: qty 1

## 2014-09-09 MED ORDER — TRANEXAMIC ACID 100 MG/ML IV SOLN
1000.0000 mg | INTRAVENOUS | Status: AC
  Administered 2014-09-09: 1000 mg via INTRAVENOUS
  Filled 2014-09-09: qty 10

## 2014-09-09 MED ORDER — HYDROMORPHONE HCL 1 MG/ML IJ SOLN
INTRAMUSCULAR | Status: AC
  Filled 2014-09-09: qty 1

## 2014-09-09 MED ORDER — VITAMIN B-12 1000 MCG PO TABS
1000.0000 ug | ORAL_TABLET | Freq: Every morning | ORAL | Status: DC
  Administered 2014-09-09 – 2014-09-12 (×4): 1000 ug via ORAL
  Filled 2014-09-09 (×4): qty 1

## 2014-09-09 MED ORDER — GELATIN ABSORBABLE MT POWD
OROMUCOSAL | Status: DC | PRN
  Administered 2014-09-09: 5 mL via TOPICAL

## 2014-09-09 MED ORDER — HYDROMORPHONE HCL 2 MG PO TABS: 4.0000 mg | ORAL_TABLET | ORAL | Status: DC | PRN

## 2014-09-09 MED ORDER — PROPOFOL 10 MG/ML IV BOLUS
INTRAVENOUS | Status: AC
  Filled 2014-09-09: qty 20

## 2014-09-09 MED ORDER — BUPIVACAINE-EPINEPHRINE (PF) 0.5% -1:200000 IJ SOLN
INTRAMUSCULAR | Status: AC
  Filled 2014-09-09: qty 30

## 2014-09-09 MED ORDER — METOCLOPRAMIDE HCL 5 MG/ML IJ SOLN
10.0000 mg | Freq: Four times a day (QID) | INTRAMUSCULAR | Status: DC | PRN
  Administered 2014-09-11: 10 mg via INTRAVENOUS
  Filled 2014-09-09: qty 2

## 2014-09-09 SURGICAL SUPPLY — 74 items
BAG DECANTER FOR FLEXI CONT (MISCELLANEOUS) IMPLANT
BAG ZIPLOCK 12X15 (MISCELLANEOUS) IMPLANT
BANDAGE ELASTIC 4 VELCRO ST LF (GAUZE/BANDAGES/DRESSINGS) ×2 IMPLANT
BANDAGE ELASTIC 6 VELCRO ST LF (GAUZE/BANDAGES/DRESSINGS) ×2 IMPLANT
BANDAGE ESMARK 6X9 LF (GAUZE/BANDAGES/DRESSINGS) ×1 IMPLANT
BLADE SAG 18X100X1.27 (BLADE) ×2 IMPLANT
BLADE SAW SGTL 11.0X1.19X90.0M (BLADE) ×2 IMPLANT
BNDG ESMARK 6X9 LF (GAUZE/BANDAGES/DRESSINGS) ×2
BONE CEMENT GENTAMICIN (Cement) ×4 IMPLANT
CAP KNEE TOTAL 3 SIGMA ×2 IMPLANT
CEMENT BONE GENTAMICIN 40 (Cement) ×2 IMPLANT
CUFF TOURN SGL QUICK 34 (TOURNIQUET CUFF) ×1
CUFF TRNQT CYL 34X4X40X1 (TOURNIQUET CUFF) ×1 IMPLANT
DERMABOND ADVANCED (GAUZE/BANDAGES/DRESSINGS) ×1
DERMABOND ADVANCED .7 DNX12 (GAUZE/BANDAGES/DRESSINGS) ×1 IMPLANT
DRAPE EXTREMITY T 121X128X90 (DRAPE) ×2 IMPLANT
DRAPE INCISE IOBAN 66X45 STRL (DRAPES) ×2 IMPLANT
DRAPE POUCH INSTRU U-SHP 10X18 (DRAPES) ×2 IMPLANT
DRAPE U-SHAPE 47X51 STRL (DRAPES) ×2 IMPLANT
DRSG AQUACEL AG ADV 3.5X10 (GAUZE/BANDAGES/DRESSINGS) ×2 IMPLANT
DRSG AQUACEL AG ADV 3.5X14 (GAUZE/BANDAGES/DRESSINGS) ×2 IMPLANT
DRSG PAD ABDOMINAL 8X10 ST (GAUZE/BANDAGES/DRESSINGS) IMPLANT
DRSG TEGADERM 4X4.75 (GAUZE/BANDAGES/DRESSINGS) ×2 IMPLANT
DURAPREP 26ML APPLICATOR (WOUND CARE) ×2 IMPLANT
ELECT REM PT RETURN 9FT ADLT (ELECTROSURGICAL) ×2
ELECTRODE REM PT RTRN 9FT ADLT (ELECTROSURGICAL) ×1 IMPLANT
EVACUATOR 1/8 PVC DRAIN (DRAIN) ×2 IMPLANT
FACESHIELD WRAPAROUND (MASK) ×10 IMPLANT
GAUZE SPONGE 2X2 8PLY STRL LF (GAUZE/BANDAGES/DRESSINGS) ×1 IMPLANT
GLOVE BIOGEL PI IND STRL 6.5 (GLOVE) ×1 IMPLANT
GLOVE BIOGEL PI IND STRL 8 (GLOVE) ×1 IMPLANT
GLOVE BIOGEL PI INDICATOR 6.5 (GLOVE) ×1
GLOVE BIOGEL PI INDICATOR 8 (GLOVE) ×1
GLOVE ECLIPSE 8.0 STRL XLNG CF (GLOVE) ×4 IMPLANT
GLOVE SURG SS PI 6.5 STRL IVOR (GLOVE) ×2 IMPLANT
GOWN STRL REUS W/TWL LRG LVL3 (GOWN DISPOSABLE) ×2 IMPLANT
GOWN STRL REUS W/TWL XL LVL3 (GOWN DISPOSABLE) ×2 IMPLANT
HANDPIECE INTERPULSE COAX TIP (DISPOSABLE) ×1
IMMOBILIZER KNEE 20 (SOFTGOODS) ×2
IMMOBILIZER KNEE 20 THIGH 36 (SOFTGOODS) ×1 IMPLANT
KIT BASIN OR (CUSTOM PROCEDURE TRAY) ×2 IMPLANT
LIQUID BAND (GAUZE/BANDAGES/DRESSINGS) ×2 IMPLANT
MANIFOLD NEPTUNE II (INSTRUMENTS) ×2 IMPLANT
NDL SAFETY ECLIPSE 18X1.5 (NEEDLE) IMPLANT
NEEDLE HYPO 18GX1.5 SHARP (NEEDLE)
NEEDLE HYPO 22GX1.5 SAFETY (NEEDLE) ×4 IMPLANT
NS IRRIG 1000ML POUR BTL (IV SOLUTION) IMPLANT
PACK TOTAL JOINT (CUSTOM PROCEDURE TRAY) ×2 IMPLANT
PADDING CAST COTTON 6X4 STRL (CAST SUPPLIES) IMPLANT
PEN SKIN MARKING BROAD (MISCELLANEOUS) ×2 IMPLANT
POSITIONER SURGICAL ARM (MISCELLANEOUS) ×2 IMPLANT
SET HNDPC FAN SPRY TIP SCT (DISPOSABLE) ×1 IMPLANT
SET PAD KNEE POSITIONER (MISCELLANEOUS) ×2 IMPLANT
SPONGE GAUZE 2X2 STER 10/PKG (GAUZE/BANDAGES/DRESSINGS) ×1
SPONGE LAP 18X18 X RAY DECT (DISPOSABLE) IMPLANT
SPONGE SURGIFOAM ABS GEL 100 (HEMOSTASIS) ×2 IMPLANT
STAPLER VISISTAT 35W (STAPLE) IMPLANT
SUCTION FRAZIER 12FR DISP (SUCTIONS) ×2 IMPLANT
SUT BONE WAX W31G (SUTURE) ×2 IMPLANT
SUT MNCRL AB 4-0 PS2 18 (SUTURE) ×2 IMPLANT
SUT VIC AB 1 CT1 27 (SUTURE) ×2
SUT VIC AB 1 CT1 27XBRD ANTBC (SUTURE) ×2 IMPLANT
SUT VIC AB 2-0 CT1 27 (SUTURE) ×3
SUT VIC AB 2-0 CT1 TAPERPNT 27 (SUTURE) ×3 IMPLANT
SUT VLOC 180 0 24IN GS25 (SUTURE) ×2 IMPLANT
SYR 20CC LL (SYRINGE) ×4 IMPLANT
SYR 50ML LL SCALE MARK (SYRINGE) ×2 IMPLANT
TOWEL OR 17X26 10 PK STRL BLUE (TOWEL DISPOSABLE) ×2 IMPLANT
TOWEL OR NON WOVEN STRL DISP B (DISPOSABLE) ×2 IMPLANT
TOWER CARTRIDGE SMART MIX (DISPOSABLE) ×2 IMPLANT
TRAY FOLEY CATH 14FRSI W/METER (CATHETERS) ×2 IMPLANT
WATER STERILE IRR 1500ML POUR (IV SOLUTION) ×2 IMPLANT
WRAP KNEE MAXI GEL POST OP (GAUZE/BANDAGES/DRESSINGS) ×2 IMPLANT
YANKAUER SUCT BULB TIP 10FT TU (MISCELLANEOUS) ×2 IMPLANT

## 2014-09-09 NOTE — Anesthesia Preprocedure Evaluation (Addendum)
Anesthesia Evaluation  Patient identified by MRN, date of birth, ID band Patient awake    Reviewed: Allergy & Precautions, NPO status , Patient's Chart, lab work & pertinent test results  Airway Mallampati: II  TM Distance: >3 FB   Mouth opening: Limited Mouth Opening  Dental   Pulmonary former smoker,    Pulmonary exam normal       Cardiovascular hypertension, + Peripheral Vascular Disease Rhythm:Regular Rate:Normal     Neuro/Psych    GI/Hepatic   Endo/Other  Hypothyroidism   Renal/GU      Musculoskeletal  (+) Arthritis -,   Abdominal   Peds  Hematology   Anesthesia Other Findings   Reproductive/Obstetrics                            Anesthesia Physical Anesthesia Plan  ASA: II  Anesthesia Plan: General   Post-op Pain Management:    Induction: Intravenous  Airway Management Planned: Oral ETT  Additional Equipment:   Intra-op Plan:   Post-operative Plan: Extubation in OR  Informed Consent: I have reviewed the patients History and Physical, chart, labs and discussed the procedure including the risks, benefits and alternatives for the proposed anesthesia with the patient or authorized representative who has indicated his/her understanding and acceptance.     Plan Discussed with: CRNA, Anesthesiologist and Surgeon  Anesthesia Plan Comments:         Anesthesia Quick Evaluation

## 2014-09-09 NOTE — Plan of Care (Signed)
Problem: Consults Goal: Diagnosis- Total Joint Replacement Primary Total Knee     

## 2014-09-09 NOTE — Interval H&P Note (Signed)
History and Physical Interval Note:  09/09/2014 8:18 AM  [name] [name]  has presented today for surgery, with the diagnosis of oa right knee   The various methods of treatment have been discussed with the patient and family. After consideration of risks, benefits and other options for treatment, the patient has consented to  Procedure(s): TOTAL RIGHT  KNEE ARTHROPLASTY (Right) as a surgical intervention .  The patient's history has been reviewed, patient examined, no change in status, stable for surgery.  I have reviewed the patient's chart and labs.  Questions were answered to the patient's satisfaction.     Fletcher Rathbun A

## 2014-09-09 NOTE — Brief Op Note (Signed)
09/09/2014  10:30 AM  PATIENT:  [name] PetrinPatsy S [name]  74 y.o. female  PRE-OPERATIVE DIAGNOSIS:  Primary Osteoarthritis of Right Knee.   POST-OPERATIVE DIAGNOSIS: Primary Osteoarthritis of Right Knee.  PROCEDURE:  Procedure(s): TOTAL RIGHT  KNEE ARTHROPLASTY (Right)  SURGEON:  Surgeon(s) and Role:    * Ranee Gosselinonald Roann Merk, MD - Primary  PHYSICIAN ASSISTANT: Dimitri PedAmber Constable PA  ASSISTANTS: Dimitri PedAmber Constable PA   ANESTHESIA:   general  EBL:  Total I/O In: 1000 [I.V.:1000] Out: 100 [Urine:100]  BLOOD ADMINISTERED:none  DRAINS: (One) Hemovact drain(s) in the Right Knee with  Suction Open   LOCAL MEDICATIONS USED:  MARCAINE 20cc of 0.50% and 20cc of Exparel mixed with 20cc of Normal Saline.     SPECIMEN:  No Specimen  DISPOSITION OF SPECIMEN:  N/A  COUNTS:  YES  TOURNIQUET:  * Missing tourniquet times found for documented tourniquets in log:  [id number] *  DICTATION: .Other Dictation: Dictation Number [id number]  PLAN OF CARE: Admit to inpatient   PATIENT DISPOSITION:  Stable in OR   Delay start of Pharmacological VTE agent (>24hrs) due to surgical blood loss or risk of bleeding: yes

## 2014-09-09 NOTE — Op Note (Signed)
NAMEJAHZARIA, [name] NO.:  [id number]  MEDICAL RECORD NO.:  [id number]  LOCATION:  1601                         FACILITY:  [facility]  PHYSICIAN:  Georges Lynch. Canaan Holzer, M.D.DATE OF BIRTH:  Sep 22, 1940  DATE OF PROCEDURE:  09/09/2014 DATE OF DISCHARGE:                              OPERATIVE REPORT   SURGEON:  Georges Lynch. [name], M.D.  OPERATIVE ASSISTANT:  Dimitri Ped, PA-C  PREOPERATIVE DIAGNOSES: 1. Severe primary osteoarthritis with bone-on-bone, right knee. 2. Flexion contracture, right knee.  POSTOPERATIVE DIAGNOSES: 1. Severe primary osteoarthritis with bone-on-bone, right knee. 2. Flexion contracture, right knee.  OPERATION: 1. Release of flexion contractures. 2. Right total knee arthroplasty utilizing the DePuy system.  The     sizes used was a size 3 right femoral component posterior cruciate     sacrificing type.  The tibial tray was a size 3.  The insert was a     size 3, 12.5 mm thickness.  The patella was a size 35 with 3 pegs.     All 3 components were cemented utilizing gentamicin in the cement.  DESCRIPTION OF PROCEDURE:  Under general anesthesia, routine orthopedic prep and draping of the right lower extremity was carried out.  The appropriate time-out was first carried out.  I also marked the appropriate right leg in the holding area.  At this time, the leg was exsanguinated with Esmarch, tourniquet was elevated to 325 mmHg.  The leg then was placed in a DeMayo knee holder in a flexed position. Anterior approach to the knee was carried out.  Two flaps were created. Following that, I then went on and did a median parapatellar incision. The patella was reflected laterally.  With the knee flexed, I did medial and lateral meniscectomies and excised the anterior and posterior cruciate ligaments and did a synovectomy.  At this time, initial drill holes were made in the intercondylar notch.  Following that, the canal finder was inserted to make  sure we are in the canal.  We then removed that and thoroughly irrigated out the femoral canal.  Next, jig was inserted.  I removed 11 mm thickness off the distal femur.  Following that, we then measured the femur to be a size 3 right.  Following that, the next jig was applied.  We did anterior, posterior, and chamfering cuts.  Once the femur was prepared, we then went on and prepared the tibia.  A drill hole was made in the tibial plateau.  The guidewire was inserted and made sure we were in the canal.  We then removed that, thoroughly irrigated out the canal, and then went on to remove the 6 mm thickness off the affected medial side of the knee.  We checked both medial and lateral laxity.  We measured 6 on both sides.  Following that, we then went on and completely debrided the knee.  We inserted our lamina spreaders, removed the posterior spurs from the femur and then inserted our spacer blocks.  We elected to use a 12.5 spacer block.  We checked in flexion and extension.  Following that, we then went on and prepared our keel cut of the tibial plateau.  We then went on, did our notch cut out the distal femur.  We inserted our trial components of the knee extension, flexion, and lateral medial stability was excellent with a 12.5 mm thickness insert.  With the trials left in at this time, we then did a resurfacing procedure on the patella for a size 35 patella. Three drill holes were made in the articular surface of the patella. Trial patella was inserted.  It fit quite nicely.  All trials were removed.  We thoroughly irrigated out the area and cleaned it out well and then cemented all 3 components in simultaneously after we dried the knee out.  While the cement was being hardened, we then removed the loose pieces of cement.  Once this was completed, we checked once again with a 12.5 mm thickness insert.  We had good stability, good flexion, and good extension.  We then removed the trial  insert and injected 20 mL of 0.5% Marcaine and epinephrine in the soft tissue areas.  We removed all loose pieces of cement.  Again prior to that, thoroughly water picked out the knee again.  We then finally inserted our permanent rotating platform size 3, 12.5 mm thickness,  reduced the knee and once again, excellent stability.  Irrigated out the knee, inserted our Hemovac drain, and closed the wound layers in usual fashion.  Note at the end, we injected 20 mL of Exparel mixed with 20 mL of 0.5% Marcaine. Also at this time, we used tranexamic acid at the beginning of the procedure and Ancef 2 g.  ADDENDUM:  This lady wore a custom jewelry necklace, developed some redness thought she may be allergic to nickel.  We did a nickel test on her. She had absolutely no reaction and once again, I could not see were you make a statement that she was allergic to nickel despite that because of the different metals in jewelry, but anyway she tested negative for any nickel reaction.          ______________________________ Georges Lynchonald A. Darrelyn HillockGioffre, M.D.     RAG/MEDQ  D:  09/09/2014  T:  09/09/2014  Job:  [id number]

## 2014-09-09 NOTE — Progress Notes (Signed)
Utilization review completed.  

## 2014-09-09 NOTE — Anesthesia Procedure Notes (Signed)
Procedure Name: Intubation Date/Time: 09/09/2014 8:37 AM Performed by: Delphia GratesHANDLER, Dia Donate Pre-anesthesia Checklist: Patient identified, Emergency Drugs available, Suction available and Patient being monitored Patient Re-evaluated:Patient Re-evaluated prior to inductionOxygen Delivery Method: Circle system utilized Preoxygenation: Pre-oxygenation with 100% oxygen Intubation Type: IV induction Laryngoscope Size: Mac and 4 Grade View: Grade II Tube type: Oral Tube size: 7.5 mm Number of attempts: 1 Airway Equipment and Method: Stylet Placement Confirmation: ETT inserted through vocal cords under direct vision,  positive ETCO2 and CO2 detector Secured at: 21 cm Tube secured with: Tape Dental Injury: Teeth and Oropharynx as per pre-operative assessment

## 2014-09-09 NOTE — Transfer of Care (Signed)
Immediate Anesthesia Transfer of Care Note  Patient: [name] [name]  Procedure(s) Performed: Procedure(s): TOTAL RIGHT  KNEE ARTHROPLASTY (Right)  Patient Location: PACU  Anesthesia Type:General  Level of Consciousness: awake, alert , oriented and patient cooperative  Airway & Oxygen Therapy: Patient Spontanous Breathing and Patient connected to face mask oxygen  Post-op Assessment: Report given to RN and Post -op Vital signs reviewed and stable  Post vital signs: Reviewed and stable  Last Vitals:  Filed Vitals:   09/09/14 0623  BP: 147/70  Pulse: 86  Temp: 36.3 C  Resp: 18    Complications: No apparent anesthesia complications

## 2014-09-09 NOTE — Anesthesia Postprocedure Evaluation (Signed)
  Anesthesia Post-op Note  Patient: [name] [name]  Procedure(s) Performed: Procedure(s): TOTAL RIGHT  KNEE ARTHROPLASTY (Right)  Patient Location: PACU  Anesthesia Type:General  Level of Consciousness: awake, oriented, sedated and patient cooperative  Airway and Oxygen Therapy: Patient Spontanous Breathing  Post-op Pain: mild, moderate  Post-op Assessment: Post-op Vital signs reviewed, Patient's Cardiovascular Status Stable, Respiratory Function Stable, Patent Airway, No signs of Nausea or vomiting and Pain level controlled  Post-op Vital Signs: stable  Last Vitals:  Filed Vitals:   09/09/14 1130  BP: 175/79  Pulse: 109  Temp:   Resp: 15    Complications: No apparent anesthesia complications

## 2014-09-10 LAB — BASIC METABOLIC PANEL
Anion gap: 8 (ref 5–15)
BUN: 11 mg/dL (ref 6–23)
CO2: 29 mmol/L (ref 19–32)
Calcium: 8.4 mg/dL (ref 8.4–10.5)
Chloride: 99 mmol/L (ref 96–112)
Creatinine, Ser: 0.52 mg/dL (ref 0.50–1.10)
GFR calc Af Amer: >90 mL/min (ref >90)
GFR calc non Af Amer: >90 mL/min (ref >90)
Glucose, Bld: 114 mg/dL — ABNORMAL HIGH (ref 70–99)
Potassium: 3.7 mmol/L (ref 3.5–5.1)
Sodium: 136 mmol/L (ref 135–145)

## 2014-09-10 LAB — CBC
HCT: 33.7 % — ABNORMAL LOW (ref 36.0–46.0)
Hemoglobin: 11.4 g/dL — ABNORMAL LOW (ref 12.0–15.0)
MCH: 30.9 pg (ref 26.0–34.0)
MCHC: 33.8 g/dL (ref 30.0–36.0)
MCV: 91.3 fL (ref 78.0–100.0)
Platelets: 166 10*3/uL (ref 150–400)
RBC: 3.69 MIL/uL — ABNORMAL LOW (ref 3.87–5.11)
RDW: 13.9 % (ref 11.5–15.5)
WBC: 14.3 10*3/uL — ABNORMAL HIGH (ref 4.0–10.5)

## 2014-09-10 MED ORDER — HYDROMORPHONE HCL 2 MG PO TABS: 2.0000 mg | ORAL_TABLET | ORAL | Status: DC | PRN

## 2014-09-10 MED ORDER — RIVAROXABAN 10 MG PO TABS: 10.0000 mg | ORAL_TABLET | Freq: Every day | ORAL | Status: DC

## 2014-09-10 MED ORDER — METHOCARBAMOL 500 MG PO TABS: 500.0000 mg | ORAL_TABLET | Freq: Four times a day (QID) | ORAL | Status: DC | PRN

## 2014-09-10 MED ORDER — FERROUS SULFATE 325 (65 FE) MG PO TABS: 325.0000 mg | ORAL_TABLET | Freq: Two times a day (BID) | ORAL | Status: DC

## 2014-09-10 MED ORDER — ONDANSETRON HCL 4 MG PO TABS: 4.0000 mg | ORAL_TABLET | Freq: Four times a day (QID) | ORAL | Status: DC | PRN

## 2014-09-10 MED ORDER — DOCUSATE SODIUM 100 MG PO CAPS
100.0000 mg | ORAL_CAPSULE | Freq: Two times a day (BID) | ORAL | Status: DC
  Administered 2014-09-10 – 2014-09-12 (×5): 100 mg via ORAL

## 2014-09-10 MED ORDER — HYDROMORPHONE HCL 2 MG PO TABS
2.0000 mg | ORAL_TABLET | ORAL | Status: DC | PRN
  Administered 2014-09-10 – 2014-09-11 (×5): 4 mg via ORAL
  Filled 2014-09-10 (×5): qty 2

## 2014-09-10 NOTE — Progress Notes (Signed)
Foley Catheter left in because of uncontrolled pain, and on-going nausea. Patient only completed one session of physical therapy because of nausea. Nausea worsened after IV dilaudid (for severe pain) was given.

## 2014-09-10 NOTE — Progress Notes (Signed)
OT Cancellation Note  Patient Details Name: Amber [name] MRN: [id number] DOB: Jul 14, 1940   Cancelled Treatment:    Reason Eval/Treat Not Completed: Medical issues which prohibited therapy.  Pt has nausea and pain still at 7.  Will check back tomorrow.  [name] 09/10/2014, 2:19 PM  Marica OtterMaryellen Sabiha Sura, OTR/L [phone] 09/10/2014

## 2014-09-10 NOTE — Progress Notes (Signed)
   Subjective: 1 Day Post-Op Procedure(s) (LRB): TOTAL RIGHT  KNEE ARTHROPLASTY (Right) Patient reports pain as moderate.   Patient seen in rounds for Dr. Darrelyn HillockGioffre. Patient is well, but has had some minor complaints of headaches and nausea/vomiting. She reports that she has vomited once but is feeling better. No SOB or chest pain. Reports that her pain is well-controlled with medicine.  Plan is to go Home after hospital stay.  Objective: Vital signs in last 24 hours: Temp:  [97.7 F (36.5 C)-98.5 F (36.9 C)] 97.8 F (36.6 C) (04/14 0619) Pulse Rate:  [83-115] 83 (04/14 0619) Resp:  [10-18] 18 (04/14 0619) BP: (131-189)/(50-91) 131/50 mmHg (04/14 0619) SpO2:  [93 %-100 %] 98 % (04/14 0619)  Intake/Output from previous day:  Intake/Output Summary (Last 24 hours) at 09/10/14 0723 Last data filed at 09/10/14 0618  Gross per 24 hour  Intake   3370 ml  Output   3500 ml  Net   -130 ml     Labs:  Recent Labs  09/10/14 0508  HGB 11.4*    Recent Labs  09/10/14 0508  WBC 14.3*  RBC 3.69*  HCT 33.7*  PLT 166    Recent Labs  09/10/14 0508  NA 136  K 3.7  CL 99  CO2 29  BUN 11  CREATININE 0.52  GLUCOSE 114*  CALCIUM 8.4    EXAM General - Patient is Alert and Oriented Extremity - Neurologically intact Intact pulses distally Dorsiflexion/Plantar flexion intact No cellulitis present Compartment soft Dressing - dressing C/D/I Motor Function - intact, moving foot and toes well on exam.  Hemovac pulled without difficulty.  Past Medical History  Diagnosis Date  . Hypothyroidism   . Hypertension   . Arthritis   . DVT (deep venous thrombosis)   . Family history of blood clots   . Peripheral vascular disease   . Varicose veins   . Left bundle branch block     patient reports history of for last several years     Assessment/Plan: 1 Day Post-Op Procedure(s) (LRB): TOTAL RIGHT  KNEE ARTHROPLASTY (Right) Active Problems:   History of total knee  arthroplasty  Estimated body mass index is 26.15 kg/(m^2) as calculated from the following:   Height as of this encounter: 5' 3.5" (1.613 m).   Weight as of this encounter: 68.04 kg (150 lb). Advance diet Up with therapy D/C IV fluids when tolerating POs well  DVT Prophylaxis - Xarelto Weight-Bearing as tolerated  D/C O2 and Pulse OX and try on Room Air  PT today. Encouraged incentive spirometer. Will plan for DC home tomorrow.   Dimitri PedAmber Donnika Kucher, PA-C Orthopaedic Surgery 09/10/2014, 7:23 AM

## 2014-09-10 NOTE — Progress Notes (Signed)
PT Cancellation Note  Patient Details Name: Edsel Petrinatsy S Halbleib MRN: [id number] DOB: Oct 02, 1940   Cancelled Treatment:    Reason Eval/Treat Not Completed: Medical issues which prohibited therapy--per OT, RN recommended therapy check back on tomorrow-pt experiencing nausea and pain level elevated.    Rebeca AlertJannie Marchello Rothgeb, MPT Pager: [phone]

## 2014-09-10 NOTE — Discharge Instructions (Addendum)
INSTRUCTIONS AFTER JOINT REPLACEMENT   Remove items at home which could result in a fall. This includes throw rugs or furniture in walking pathways ICE to the affected joint every three hours while awake for 30 minutes at a time, for at least the first 3-5 days, and then as needed for pain and swelling.  Continue to use ice for pain and swelling. You may notice swelling that will progress down to the foot and ankle.  This is normal after surgery.  Elevate your leg when you are not up walking on it.   Continue to use the breathing machine you got in the hospital (incentive spirometer) which will help keep your temperature down.  It is common for your temperature to cycle up and down following surgery, especially at night when you are not up moving around and exerting yourself.  The breathing machine keeps your lungs expanded and your temperature down.   DIET:  As you were doing prior to hospitalization, we recommend a well-balanced diet.  DRESSING / WOUND CARE / SHOWERING  Keep the surgical dressing until follow up.  The dressing is water proof, so you can shower without any extra covering.  IF THE DRESSING FALLS OFF or the wound gets wet inside, change the dressing with sterile gauze.  Please use good hand washing techniques before changing the dressing.  Do not use any lotions or creams on the incision until instructed by your surgeon.    ACTIVITY  Increase activity slowly as tolerated, but follow the weight bearing instructions below.   No driving for 6 weeks or until further direction given by your physician.  You cannot drive while taking narcotics.  No lifting or carrying greater than 10 lbs. until further directed by your surgeon. Avoid periods of inactivity such as sitting longer than an hour when not asleep. This helps prevent blood clots.  You may return to work once you are authorized by your doctor.     WEIGHT BEARING   Weight bearing as tolerated with assist device (walker,  cane, etc) as directed, use it as long as suggested by your surgeon or therapist, typically at least 4-6 weeks.   EXERCISES  Results after joint replacement surgery are often greatly improved when you follow the exercise, range of motion and muscle strengthening exercises prescribed by your doctor. Safety measures are also important to protect the joint from further injury. Any time any of these exercises cause you to have increased pain or swelling, decrease what you are doing until you are comfortable again and then slowly increase them. If you have problems or questions, call your caregiver or physical therapist for advice.   Rehabilitation is important following a joint replacement. After just a few days of immobilization, the muscles of the leg can become weakened and shrink (atrophy).  These exercises are designed to build up the tone and strength of the thigh and leg muscles and to improve motion. Often times heat used for twenty to thirty minutes before working out will loosen up your tissues and help with improving the range of motion but do not use heat for the first two weeks following surgery (sometimes heat can increase post-operative swelling).   These exercises can be done on a training (exercise) mat, on the floor, on a table or on a bed. Use whatever works the best and is most comfortable for you.    Use music or television while you are exercising so that the exercises are a pleasant break in your  day. This will make your life better with the exercises acting as a break in your routine that you can look forward to.   Perform all exercises about fifteen times, three times per day or as directed.  You should exercise both the operative leg and the other leg as well.   Exercises include:   Quad Sets - Tighten up the muscle on the front of the thigh (Quad) and hold for 5-10 seconds.   Straight Leg Raises - With your knee straight (if you were given a brace, keep it on), lift the leg to 60  degrees, hold for 3 seconds, and slowly lower the leg.  Perform this exercise against resistance later as your leg gets stronger.  Leg Slides: Lying on your back, slowly slide your foot toward your buttocks, bending your knee up off the floor (only go as far as is comfortable). Then slowly slide your foot back down until your leg is flat on the floor again.  Angel Wings: Lying on your back spread your legs to the side as far apart as you can without causing discomfort.  Hamstring Strength:  Lying on your back, push your heel against the floor with your leg straight by tightening up the muscles of your buttocks.  Repeat, but this time bend your knee to a comfortable angle, and push your heel against the floor.  You may put a pillow under the heel to make it more comfortable if necessary.   A rehabilitation program following joint replacement surgery can speed recovery and prevent re-injury in the future due to weakened muscles. Contact your doctor or a physical therapist for more information on knee rehabilitation.    CONSTIPATION  Constipation is defined medically as fewer than three stools per week and severe constipation as less than one stool per week.  Even if you have a regular bowel pattern at home, your normal regimen is likely to be disrupted due to multiple reasons following surgery.  Combination of anesthesia, postoperative narcotics, change in appetite and fluid intake all can affect your bowels.   YOU MUST use at least one of the following options; they are listed in order of increasing strength to get the job done.  They are all available over the counter, and you may need to use some, POSSIBLY even all of these options:    Drink plenty of fluids (prune juice may be helpful) and high fiber foods Colace 100 mg by mouth twice a day  Senokot for constipation as directed and as needed Dulcolax (bisacodyl), take with full glass of water  Miralax (polyethylene glycol) once or twice a day as  needed.  If you have tried all these things and are unable to have a bowel movement in the first 3-4 days after surgery call either your surgeon or your primary doctor.    If you experience loose stools or diarrhea, hold the medications until you stool forms back up.  If your symptoms do not get better within 1 week or if they get worse, check with your doctor.  If you experience "the worst abdominal pain ever" or develop nausea or vomiting, please contact the office immediately for further recommendations for treatment.   ITCHING:  If you experience itching with your medications, try taking only a single pain pill, or even half a pain pill at a time.  You can also use Benadryl over the counter for itching or also to help with sleep.   TED HOSE STOCKINGS:  Use stockings on  both legs until for at least 2 weeks or as directed by physician office. They may be removed at night for sleeping.  MEDICATIONS:  See your medication summary on the After Visit Summary that nursing will review with you.  You may have some home medications which will be placed on hold until you complete the course of blood thinner medication.  It is important for you to complete the blood thinner medication as prescribed.  PRECAUTIONS:  If you experience chest pain or shortness of breath - call 911 immediately for transfer to the hospital emergency department.   If you develop a fever greater that 101 F, purulent drainage from wound, increased redness or drainage from wound, foul odor from the wound/dressing, or calf pain - CONTACT YOUR SURGEON.                                                   FOLLOW-UP APPOINTMENTS:  If you do not already have a post-op appointment, please call the office for an appointment to be seen by your surgeon.  Guidelines for how soon to be seen are listed in your After Visit Summary, but are typically between 1-4 weeks after surgery.  OTHER INSTRUCTIONS:   Do not take vitamins or supplements  while you are taking Xarelto  MAKE SURE YOU:  Understand these instructions.  Get help right away if you are not doing well or get worse.    Thank you for letting us be a part of your medical care team.  It is a privilege we respect greatly.  We hope these instructions will help you stay on track for a fast and full recovery!  Information on my medicine - XARELTO (Rivaroxaban)  This medication education was reviewed with me or my healthcare representative as part of my discharge preparation.  The pharmacist that spoke with me during my hospital stay was:  WOFFORD, DREW A, RPH  Why was Xarelto prescribed for you? Xarelto was prescribed for you to reduce the risk of blood clots forming after orthopedic surgery. The medical term for these abnormal blood clots is venous thromboembolism (VTE).  What do you need to know about xarelto ? Take your Xarelto ONCE DAILY at the same time every day. You may take it either with or without food.  If you have difficulty swallowing the tablet whole, you may crush it and mix in applesauce just prior to taking your dose.  Take Xarelto exactly as prescribed by your doctor and DO NOT stop taking Xarelto without talking to the doctor who prescribed the medication.  Stopping without other VTE prevention medication to take the place of Xarelto may increase your risk of developing a clot.  After discharge, you should have regular check-up appointments with your healthcare provider that is prescribing your Xarelto.    What do you do if you miss a dose? If you miss a dose, take it as soon as you remember on the same day then continue your regularly scheduled once daily regimen the next day. Do not take two doses of Xarelto on the same day.   Important Safety Information A possible side effect of Xarelto is bleeding. You should call your healthcare provider right away if you experience any of the following: ? Bleeding from an injury or your nose that  does not stop. ? Unusual colored urine (red  or dark brown) or unusual colored stools (red or black). ? Unusual bruising for unknown reasons. ? A serious fall or if you hit your head (even if there is no bleeding).  Some medicines may interact with Xarelto and might increase your risk of bleeding while on Xarelto. To help avoid this, consult your healthcare provider or pharmacist prior to using any new prescription or non-prescription medications, including herbals, vitamins, non-steroidal anti-inflammatory drugs (NSAIDs) and supplements.  This website has more information on Xarelto: [email or web address]

## 2014-09-10 NOTE — Evaluation (Signed)
Physical Therapy Evaluation Patient Details Name: Amber [name] MRN: [id number] DOB: 15-Jun-1940 Today's Date: 09/10/2014   History of Present Illness  [age] yo female s/p R TKA 09/09/14. Hx of LBBB, DVT, PVD, HTN, bil shoulder surgery.   Clinical Impression  On eval,pt required Mod assist to stand, Min assist for bed mobility and ambulation distance of ~45 feet with RW. Mobility limited by pain, fatigue. Discussed d/c plan with pt/family-plan is for home.     Follow Up Recommendations Home health PT;Supervision/Assistance - 24 hour    Equipment Recommendations  None recommended by PT    Recommendations for Other Services OT consult     Precautions / Restrictions Precautions Precautions: Fall Required Braces or Orthoses: Knee Immobilizer - Right Knee Immobilizer - Right: Discontinue once straight leg raise with < 10 degree lag Restrictions Weight Bearing Restrictions: No RLE Weight Bearing: Weight bearing as tolerated      Mobility  Bed Mobility Overal bed mobility: Needs Assistance Bed Mobility: Supine to Sit     Supine to sit: Min assist;HOB elevated     General bed mobility comments: Assist for trunk and LEs. Increased time. VCs technique.   Transfers Overall transfer level: Needs assistance Equipment used: Rolling walker (2 wheeled) Transfers: Sit to/from Stand Sit to Stand: From elevated surface;Mod assist         General transfer comment: Assist to rise, stabilize, control descent. VCS safety, technique, hand placement  Ambulation/Gait Ambulation/Gait assistance: Min assist Ambulation Distance (Feet): 45 Feet Assistive device: Rolling walker (2 wheeled) Gait Pattern/deviations: Step-to pattern;Trunk flexed;Antalgic;Decreased step length - right     General Gait Details: assist to stabilize. VCS safety, sequence, step length.   Stairs            Wheelchair Mobility    Modified Rankin (Stroke Patients Only)       Balance                                              Pertinent Vitals/Pain Pain Assessment: Faces Faces Pain Scale: Hurts even more Pain Location: R knee with activity Pain Descriptors / Indicators: Aching;Sore Pain Intervention(s): Monitored during session;Ice applied;Repositioned    Home Living Family/patient expects to be discharged to:: Private residence Living Arrangements: Alone Available Help at Discharge: Family;Available 24 hours/day (sister) Type of Home: House     Entrance Stairs-Number of Steps: 1+1 +1 (separation in between) Home Layout: Two level;Able to live on main level with bedroom/bathroom Home Equipment: Dan HumphreysWalker - 2 wheels;Bedside commode;Shower seat Additional Comments: walk in shower with step to enter    Prior Function Level of Independence: Independent               Hand Dominance        Extremity/Trunk Assessment   Upper Extremity Assessment: Defer to OT evaluation           Lower Extremity Assessment: RLE deficits/detail RLE Deficits / Details: at least: hip flex 2/5, moves ankle well    Cervical / Trunk Assessment: Normal  Communication   Communication: No difficulties  Cognition Arousal/Alertness: Awake/alert Behavior During Therapy: WFL for tasks assessed/performed Overall Cognitive Status: Within Functional Limits for tasks assessed                      General Comments      Exercises  Assessment/Plan    PT Assessment Patient needs continued PT services  PT Diagnosis Difficulty walking;Acute pain   PT Problem List Decreased strength;Decreased range of motion;Decreased activity tolerance;Decreased mobility;Decreased knowledge of precautions;Pain;Decreased knowledge of use of DME;Decreased balance  PT Treatment Interventions DME instruction;Gait training;Stair training;Functional mobility training;Therapeutic activities;Therapeutic exercise;Patient/family education;Balance training   PT Goals (Current goals can be  found in the Care Plan section) Acute Rehab PT Goals Patient Stated Goal: home. less pain. regain independence PT Goal Formulation: With patient/family Time For Goal Achievement: 09/17/14 Potential to Achieve Goals: Good    Frequency 7X/week   Barriers to discharge        Co-evaluation               End of Session Equipment Utilized During Treatment: Gait belt Activity Tolerance: Patient limited by fatigue;Patient limited by pain Patient left: in chair;with call bell/phone within reach;with family/visitor present           Time: 4098-1191 PT Time Calculation (min) (ACUTE ONLY): 17 min   Charges:   PT Evaluation $Initial PT Evaluation Tier I: 1 Procedure     PT G Codes:        Rebeca Alert, MPT Pager: [phone]

## 2014-09-11 ENCOUNTER — Encounter (HOSPITAL_COMMUNITY): Payer: Self-pay | Admitting: Radiology

## 2014-09-11 ENCOUNTER — Inpatient Hospital Stay (HOSPITAL_COMMUNITY): Payer: Medicare Other

## 2014-09-11 LAB — BASIC METABOLIC PANEL
Anion gap: 7 (ref 5–15)
BUN: 8 mg/dL (ref 6–23)
CO2: 33 mmol/L — ABNORMAL HIGH (ref 19–32)
Calcium: 8.4 mg/dL (ref 8.4–10.5)
Chloride: 93 mmol/L — ABNORMAL LOW (ref 96–112)
Creatinine, Ser: 0.58 mg/dL (ref 0.50–1.10)
GFR calc Af Amer: >90 mL/min (ref >90)
GFR calc non Af Amer: 87 mL/min — ABNORMAL LOW (ref >90)
Glucose, Bld: 113 mg/dL — ABNORMAL HIGH (ref 70–99)
Potassium: 3.6 mmol/L (ref 3.5–5.1)
Sodium: 133 mmol/L — ABNORMAL LOW (ref 135–145)

## 2014-09-11 LAB — CBC
HCT: 32.4 % — ABNORMAL LOW (ref 36.0–46.0)
Hemoglobin: 10.9 g/dL — ABNORMAL LOW (ref 12.0–15.0)
MCH: 31.5 pg (ref 26.0–34.0)
MCHC: 33.6 g/dL (ref 30.0–36.0)
MCV: 93.6 fL (ref 78.0–100.0)
Platelets: 172 10*3/uL (ref 150–400)
RBC: 3.46 MIL/uL — ABNORMAL LOW (ref 3.87–5.11)
RDW: 14.3 % (ref 11.5–15.5)
WBC: 14.6 10*3/uL — ABNORMAL HIGH (ref 4.0–10.5)

## 2014-09-11 MED ORDER — SIMVASTATIN 20 MG PO TABS
20.0000 mg | ORAL_TABLET | Freq: Every morning | ORAL | Status: DC
  Administered 2014-09-11 – 2014-09-12 (×2): 20 mg via ORAL
  Filled 2014-09-11: qty 1

## 2014-09-11 MED ORDER — HYDROCODONE-ACETAMINOPHEN 7.5-325 MG PO TABS
1.0000 | ORAL_TABLET | ORAL | Status: DC | PRN
  Administered 2014-09-11 – 2014-09-12 (×6): 1 via ORAL
  Filled 2014-09-11 (×8): qty 1

## 2014-09-11 MED ORDER — IOHEXOL 350 MG/ML SOLN
100.0000 mL | Freq: Once | INTRAVENOUS | Status: AC | PRN
  Administered 2014-09-11: 100 mL via INTRAVENOUS

## 2014-09-11 MED ORDER — HYDROCODONE-ACETAMINOPHEN 7.5-325 MG PO TABS: 1.0000 | ORAL_TABLET | ORAL | Status: DC | PRN

## 2014-09-11 NOTE — Progress Notes (Signed)
CARE MANAGEMENT NOTE 09/11/2014  Patient:  Amber [name],[name]   Account Number:  [id number]  Date Initiated:  09/11/2014  Documentation initiated by:  Ferdinand CavaSCHETTINO,Jourdyn Ferrin  Subjective/Objective Assessment:   74 yo female admitted with TKA from home     Action/Plan:   discharge planning   Anticipated DC Date:  09/11/2014   Anticipated DC Plan:  HOME W HOME HEALTH SERVICES      DC Planning Services  CM consult      Choice offered to / List presented to:          [facility]  [facility]      [facility]  [facility]   Status of service:  Completed, signed off Medicare Important Message given?   (If response is "NO", the following Medicare IM given date fields will be blank) Date Medicare IM given:   Medicare IM given by:   Date Additional Medicare IM given:   Additional Medicare IM given by:    Discharge Disposition:  HOME W HOME HEALTH SERVICES  Per UR Regulation:    If discussed at Long Length of Stay Meetings, dates discussed:    Comments:  09/11/14 Ferdinand CavaAndrea Schettino RN BSN CM [phone] [facility], reviewed this information with the patient and verified address and contact information.

## 2014-09-11 NOTE — Progress Notes (Addendum)
Physical Therapy Treatment Patient Details Name: Amber [name] MRN: [id number] DOB: March 19, 1941 Today's Date: 09/11/2014    History of Present Illness 74 yo female s/p R TKA 09/09/14. Hx of LBBB, DVT, PVD, HTN, bil shoulder surgery.     PT Comments    Progressing slowly with mobility. O2 sats dropped to 87-88% on RA sitting EOB so replaced [place] O2-2L and left pt on O2 during session-sats 94%. Limited by fatigue, pain, drowsiness. Spoke with RN who states discharge has been cancelled for today. Will continue to follow and progress activity as tolerated.   Follow Up Recommendations  Home health PT;Supervision/Assistance - 24 hour     Equipment Recommendations  None recommended by PT    Recommendations for Other Services OT consult     Precautions / Restrictions Precautions Precautions: Fall Precaution Comments: monitor O2, HR.  Required Braces or Orthoses: Knee Immobilizer - Right Knee Immobilizer - Right: Discontinue once straight leg raise with < 10 degree lag Restrictions Weight Bearing Restrictions: No RLE Weight Bearing: Weight bearing as tolerated    Mobility  Bed Mobility Overal bed mobility: Needs Assistance Bed Mobility: Supine to Sit;Sit to Supine     Supine to sit: HOB elevated;Mod assist     General bed mobility comments: Increassed time. Assist for R LE and trunk. VCs safety, hand placement  Transfers Overall transfer level: Needs assistance Equipment used: Rolling walker (2 wheeled) Transfers: Sit to/from Stand Sit to Stand: Mod assist;From elevated surface         General transfer comment: Assist to rise, stabilize, control descent. VCS safety, technique, hand placement  Ambulation/Gait Ambulation/Gait assistance: Min assist Ambulation Distance (Feet): 25 Feet (x2) Assistive device: Rolling walker (2 wheeled) Gait Pattern/deviations: Step-to pattern;Trunk flexed;Antalgic;Decreased step length - right     General Gait Details: Assist to stabilize  and maneuver with RW. VCs safety, technique, sequence. slow gait speed. followed with recliner. Seated rest break needed between walks.    Stairs            Wheelchair Mobility    Modified Rankin (Stroke Patients Only)       Balance                                    Cognition Arousal/Alertness: Awake/alert Behavior During Therapy: WFL for tasks assessed/performed Overall Cognitive Status: Within Functional Limits for tasks assessed                      Exercises Total Joint Exercises Ankle Circles/Pumps: AROM;Both;10 reps;Supine Quad Sets: AROM;Both;10 reps;Supine Heel Slides: AAROM;Right;10 reps;Supine Hip ABduction/ADduction: AAROM;Right;10 reps;Supine Straight Leg Raises: AAROM;Right;10 reps;Supine Goniometric ROM: ~10-45 degrees    General Comments        Pertinent Vitals/Pain Pain Assessment: Faces Faces Pain Scale: Hurts even more Pain Location: R groing, thigh, knee Pain Descriptors / Indicators: Aching;Sore Pain Intervention(s): Monitored during session;Ice applied;Repositioned    Home Living Family/patient expects to be discharged to:: Private residence Living Arrangements: Alone Available Help at Discharge: Family;Available 24 hours/day Type of Home: House Home Access: Stairs to enter   Home Layout: Two level;Able to live on main level with bedroom/bathroom Home Equipment: Dan Humphreys - 2 wheels;Bedside commode;Shower seat      Prior Function Level of Independence: Independent          PT Goals (current goals can now be found in the care plan section) Acute Rehab PT Goals Patient  Stated Goal: home. less pain. regain independence Progress towards PT goals: Progressing toward goals    Frequency  7X/week    PT Plan Current plan remains appropriate    Co-evaluation             End of Session Equipment Utilized During Treatment: Gait belt;Right knee immobilizer Activity Tolerance: Patient limited by  fatigue;Patient limited by pain Patient left: in bed;with call bell/phone within reach;with family/visitor present     Time: [id number] PT Time Calculation (min) (ACUTE ONLY): 32 min  Charges:  $Gait Training: 8-22 mins $Therapeutic Exercise: 8-22 mins                    G Codes:      Amber [name], MPT Pager: [phone]

## 2014-09-11 NOTE — Evaluation (Signed)
Occupational Therapy Evaluation Patient Details Name: Amber [name] MRN: [id number] DOB: [birth year] Today's Date: 09/11/2014    History of Present Illness 74 yo female s/p R TKA 09/09/14. Hx of LBBB, DVT, PVD, HTN, bil shoulder surgery.    Clinical Impression   Pt was admitted for the above surgery. Evaluation was limited by pain and nausea, but pt did participate in ADL.  She will benefit from ongoing OT to increase safety and independence with adls.  Goals are for min to mod A overall.  Pt currently needs mod A for sit to stand and max to total A for LB adls.  She was independent prior to admission    Follow Up Recommendations  Home health OT;Supervision/Assistance - 24 hour;SNF (vs, depending upon progress)    Equipment Recommendations  None recommended by OT    Recommendations for Other Services       Precautions / Restrictions Precautions Precautions: Fall Required Braces or Orthoses: Knee Immobilizer - Right Knee Immobilizer - Right: Discontinue once straight leg raise with < 10 degree lag Restrictions RLE Weight Bearing: Weight bearing as tolerated      Mobility Bed Mobility         Supine to sit: Min assist;HOB elevated     General bed mobility comments: extra time, support of RLE and assist to scoot forward to EOB  Transfers   Equipment used: Rolling walker (2 wheeled) Transfers: Sit to/from Stand Sit to Stand: Mod assist;From elevated surface         General transfer comment: Assist to rise, stabilize, control descent. VCS safety, technique, hand placement    Balance                                            ADL Overall ADL's : Needs assistance/impaired     Grooming: Set up;Sitting   Upper Body Bathing: Set up;Sitting   Lower Body Bathing: Maximal assistance;Sit to/from stand   Upper Body Dressing : Minimal assistance;Sitting (IV)                     General ADL Comments: performed ADL from EOB.  Pt initially  with groin pain:  improved at EOB.  Dizzy after sitting for several minutes.  BP 165//65, 104, 97% on 3 liters.  Pt vomited a small amount after standing for peri care.  RN assisted OT with helping pt return to supine.       Vision     Perception     Praxis      Pertinent Vitals/Pain Pain Assessment: Faces Faces Pain Scale: Hurts even more Pain Location: R thigh and groin Pain Descriptors / Indicators: Aching Pain Intervention(s): Monitored during session;Premedicated before session;Limited activity within patient's tolerance;Repositioned     Hand Dominance     Extremity/Trunk Assessment Upper Extremity Assessment Upper Extremity Assessment: Overall WFL for tasks assessed           Communication Communication Communication: No difficulties   Cognition Arousal/Alertness: Awake/alert Behavior During Therapy: WFL for tasks assessed/performed Overall Cognitive Status: Within Functional Limits for tasks assessed                     General Comments       Exercises       Shoulder Instructions      Home Living Family/patient expects to be discharged to:: Private residence  Living Arrangements: Alone Available Help at Discharge: Family;Available 24 hours/day Type of Home: House Home Access: Stairs to enter     Home Layout: Two level;Able to live on main level with bedroom/bathroom     Bathroom Shower/Tub: Walk-in shower   Bathroom Toilet: Handicapped height     Home Equipment: Environmental consultantWalker - 2 wheels;Bedside commode;Shower seat          Prior Functioning/Environment Level of Independence: Independent             OT Diagnosis: Generalized weakness;Acute pain   OT Problem List: Decreased strength;Decreased activity tolerance;Decreased knowledge of use of DME or AE;Pain   OT Treatment/Interventions: Self-care/ADL training;DME and/or AE instruction;Patient/family education;Balance training;Therapeutic activities    OT Goals(Current goals can be  found in the care plan section) Acute Rehab OT Goals Patient Stated Goal: home. less pain. regain independence OT Goal Formulation: With patient Time For Goal Achievement: 09/18/14 Potential to Achieve Goals: Good ADL Goals Pt Will Perform Lower Body Bathing: with mod assist;sit to/from stand Pt Will Transfer to Toilet: with min assist;stand pivot transfer;bedside commode Pt Will Perform Toileting - Clothing Manipulation and hygiene: with min assist;sit to/from stand  OT Frequency: Min 2X/week   Barriers to D/C:            Co-evaluation              End of Session    Activity Tolerance: Patient tolerated treatment well Patient left: in bed;with call bell/phone within reach;with family/visitor present;with nursing/sitter in room   Time: [id number] OT Time Calculation (min): 29 min Charges:  OT General Charges $OT Visit: 1 Procedure OT Evaluation $Initial OT Evaluation Tier I: 1 Procedure OT Treatments $Self Care/Home Management : 8-22 mins G-Codes:    Amber [name] 09/11/2014, 10:42 AM  Marica OtterMaryellen Giacomo [name], OTR/L [phone] 09/11/2014

## 2014-09-11 NOTE — Progress Notes (Signed)
Notified on-call PA about pt elevated heart rate and room air oxygen saturation in the 80s. Pt placed on 3L of oxygen. She stated to keep pt on oxygen and physician will be doing rounding soon. Will continue to monitor.

## 2014-09-11 NOTE — Progress Notes (Signed)
Physical Therapy Treatment Patient Details Name: Amber [name] MRN: [id number] DOB: September 18, 1940 Today's Date: 09/11/2014    History of Present Illness 74 yo female s/p R TKA 09/09/14. Hx of LBBB, DVT, PVD, HTN, bil shoulder surgery.     PT Comments    Progressing slowly with mobility. Pt was able to ambulate ~40 feet this session. O2 sats 91% on RA, HR 130 bpm during ambulation. Mobility remains limited by pain, weakness, and decreased activity tolerance. Pt is now considering ST rehab placement-she is concerned her sister may not be able to manage her care at home. Encouraged pt to discuss this further with her family. Made CSW Amber [name](Amber [name]) aware of possible need for SW consult. Will continue to follow and progress activity as able.     Follow Up Recommendations  Home health PT;Supervision/Assistance - 24 hour vs SNF (depending on progress. Pt now considering ST rehab-she is concerned her sister may not be able to manage her care at current level)     Equipment Recommendations  None recommended by PT    Recommendations for Other Services OT consult     Precautions / Restrictions Precautions Precautions: Fall Precaution Comments: monitor O2, HR.  Required Braces or Orthoses: Knee Immobilizer - Right Knee Immobilizer - Right: Discontinue once straight leg raise with < 10 degree lag Restrictions Weight Bearing Restrictions: No RLE Weight Bearing: Weight bearing as tolerated    Mobility  Bed Mobility Overal bed mobility: Needs Assistance Bed Mobility: Supine to Sit;Sit to Supine     Supine to sit: Mod assist;HOB elevated Sit to supine: Min assist;HOB elevated   General bed mobility comments: Increased time. Assist for R LE and trunk. VCs safety, hand placement  Transfers Overall transfer level: Needs assistance Equipment used: Rolling walker (2 wheeled) Transfers: Sit to/from Stand Sit to Stand: Mod assist;From elevated surface         General transfer comment: Assist to  rise, stabilize, control descent. VCS safety, technique, hand placement  Ambulation/Gait Ambulation/Gait assistance: Min assist Ambulation Distance (Feet): 40 Feet Assistive device: Rolling walker (2 wheeled) Gait Pattern/deviations: Step-to pattern;Antalgic;Trunk flexed;Decreased step length - right     General Gait Details: Assist to stabilize and maneuver with RW. VCs safety, technique, sequence. slow gait speed. Standing rest breaks needed. VCs for deep breathing intermittently. O2 sats 91% on RA, HR 130 bpm during ambulation on today.    Stairs            Wheelchair Mobility    Modified Rankin (Stroke Patients Only)       Balance                                    Cognition Arousal/Alertness: Awake/[name] Behavior During Therapy: WFL for tasks assessed/performed Overall Cognitive Status: Within Functional Limits for tasks assessed                      Exercises      General Comments        Pertinent Vitals/Pain Pain Assessment: Faces Pain Score: 8  Pain Location: R groin, thigh, knee Pain Descriptors / Indicators: Sore Pain Intervention(s): Monitored during session;Ice applied;Repositioned    Home Living                      Prior Function            PT Goals (current goals can now  be found in the care plan section) Progress towards PT goals: Progressing toward goals (slowly)    Frequency  7X/week    PT Plan Current plan remains appropriate    Co-evaluation             End of Session Equipment Utilized During Treatment: Gait belt;Right knee immobilizer Activity Tolerance: Patient limited by fatigue;Patient limited by pain Patient left: in bed;with call bell/phone within reach;with family/visitor present     Time: 1610-9604 PT Time Calculation (min) (ACUTE ONLY): 21 min  Charges:  $Gait Training: 8-22 mins                    G Codes:      Amber [name], MPT Pager: [phone]

## 2014-09-11 NOTE — Progress Notes (Signed)
Subjective: 2 Days Post-Op Procedure(s) (LRB): TOTAL RIGHT  KNEE ARTHROPLASTY (Right) Patient reports pain as 3 on 0-10 scale. Slight Tachycardia last night with a drop in her Oxygen Stats. Better this A.M. And no signs of any distress. Will get up and ambulate and observe today and if okay will DC this afternoon. If any question will DC Tomorrow.Temp 100.8.She is not in any respiratory distress.SpO2 is 92% on 3 Liters.HBg 10.9    Objective: Vital signs in last 24 hours: Temp:  [98.1 F (36.7 C)-100.8 F (38.2 C)] 100.8 F (38.2 C) (04/15 0510) Pulse Rate:  [79-121] 117 (04/15 0510) Resp:  [16-20] 16 (04/15 0500) BP: (122-180)/(47-68) 164/66 mmHg (04/15 0500) SpO2:  [90 %-100 %] 92 % (04/15 0500)  Intake/Output from previous day: 04/14 0701 - 04/15 0700 In: 2713.3 [P.O.:720; I.V.:1893.3] Out: 2700 [Urine:2700] Intake/Output this shift:     Recent Labs  09/10/14 0508 09/11/14 0450  HGB 11.4* 10.9*    Recent Labs  09/10/14 0508 09/11/14 0450  WBC 14.3* 14.6*  RBC 3.69* 3.46*  HCT 33.7* 32.4*  PLT 166 172    Recent Labs  09/10/14 0508 09/11/14 0450  NA 136 133*  K 3.7 3.6  CL 99 93*  CO2 29 33*  BUN 11 8  CREATININE 0.52 0.58  GLUCOSE 114* 113*  CALCIUM 8.4 8.4   No results for input(s): LABPT, INR in the last 72 hours.  Neurologically intact  Assessment/Plan: 2 Days Post-Op Procedure(s) (LRB): TOTAL RIGHT  KNEE ARTHROPLASTY (Right) Up with therapy.Will see how she does today with PT,prior to DC.  Telesforo Brosnahan A 09/11/2014, 7:06 AM

## 2014-09-12 LAB — CBC
HCT: 30.4 % — ABNORMAL LOW (ref 36.0–46.0)
Hemoglobin: 10.2 g/dL — ABNORMAL LOW (ref 12.0–15.0)
MCH: 31 pg (ref 26.0–34.0)
MCHC: 33.6 g/dL (ref 30.0–36.0)
MCV: 92.4 fL (ref 78.0–100.0)
Platelets: 153 10*3/uL (ref 150–400)
RBC: 3.29 MIL/uL — ABNORMAL LOW (ref 3.87–5.11)
RDW: 13.7 % (ref 11.5–15.5)
WBC: 13.9 10*3/uL — ABNORMAL HIGH (ref 4.0–10.5)

## 2014-09-12 NOTE — Progress Notes (Signed)
CSW continuing to follow.  CSW followed up with pt and pt daughter, son, and sister at bedside. CSW provided SNF bed offers. [facility]   [facility] and confirmed with facility that they could accept pt today and private room available.   CSW contacted PA, Marriottmber Constable and notified of available bed today. PA confirmed with MD that pt stable for discharge today.  CSW to facilitate [facility].  Loletta SpecterSuzanna Evona Westra, MSW, LCSW Clinical Social Work Weekend coverage [phone]

## 2014-09-12 NOTE — Care Management Note (Signed)
    Page 1 of 1   09/12/2014     10:52:27 AM CARE MANAGEMENT NOTE 09/12/2014  Patient:  Amber [name],Amber [name]   Account Number:  [id number]  Date Initiated:  09/11/2014  Documentation initiated by:  Ferdinand CavaSCHETTINO,ANDREA  Subjective/Objective Assessment:   74 yo female admitted with TKA from home     Action/Plan:   discharge planning   Anticipated DC Date:  09/12/2014   Anticipated DC Plan:  SKILLED NURSING FACILITY      DC Planning Services  CM consult      Choice offered to / List presented to:             Status of service:  Completed, signed off Medicare Important Message given?  YES (If response is "NO", the following Medicare IM given date fields will be blank) Date Medicare IM given:  09/12/2014 Medicare IM given by:  [facility],Lillieann Pavlich Date Additional Medicare IM given:   Additional Medicare IM given by:    Discharge Disposition:  SKILLED NURSING FACILITY  Per UR Regulation:  Reviewed for med. necessity/level of care/duration of stay  If discussed at Long Length of Stay Meetings, dates discussed:    Comments:  09/12/14 Lanier ClamKathy Korbyn Vanes RN BSN [phone] dc snf.  09/11/14 Ferdinand CavaAndrea Schettino RN BSN CM [phone] [facility], reviewed this information with the patient and verified address and contact information.

## 2014-09-12 NOTE — Progress Notes (Addendum)
Clinical Social Work Department CLINICAL SOCIAL WORK PLACEMENT NOTE 09/12/2014  Patient:  Amber [name],Amber [name]  Account Number:  [id number] Admit date:  09/09/2014  Clinical Social Worker:  Garlan FairSUZANNA Ritamarie Arkin, LCSWA  Date/time:  09/12/2014 09:40 AM  Clinical Social Work is seeking post-discharge placement for this patient at the following level of care:   SKILLED NURSING   (*CSW will update this form in Epic as items are completed)   09/12/2014  Patient/family provided with [facility] Department of Clinical Social Work'[name] list of facilities offering this level of care within the geographic area requested by the patient (or if unable, by the patient'[name] family).  09/12/2014  Patient/family informed of their freedom to choose among providers that offer the needed level of care, that participate in Medicare, Medicaid or managed care program needed by the patient, have an available bed and are willing to accept the patient.  09/12/2014  Patient/family informed of [facility]' ownership interest in [facility], as well as of the fact that they are under no obligation to receive care at this facility.  PASARR submitted to EDS on 09/12/2014 PASARR number received on 09/12/2014  FL2 transmitted to all facilities in geographic area requested by pt/family on  09/12/2014 FL2 transmitted to all facilities within larger geographic area on   Patient informed that his/her managed care company has contracts with or will negotiate with  certain facilities, including the following:     Patient/family informed of bed offers received:  09/12/2014 [facility] Physician recommends and patient chooses bed at    [facility] on 09[facility]12/2014 Patient to be transferred to facility by pt son via private vehicle Patient and family notified of transfer on 09/12/2014 Name of family member notified:  Pt and pt sister at bedside. Pt son, Ross MarcusSherwood aware of plan.    The following physician request were entered in Epic:   Additional Comments:   Loletta SpecterSuzanna Isam Unrein, MSW, LCSW Clinical Social Work Weekend coverage [phone]

## 2014-09-12 NOTE — Progress Notes (Signed)
Pt and family provided discharge packet and instructions to take to SNF.  Pt IV removed without complications.  Report called to facility.  Pt wheeled to front entrance.    Thane EduKimberly Tamar Lipscomb, RN

## 2014-09-12 NOTE — Progress Notes (Signed)
   Subjective: 3 Days Post-Op Procedure(s) (LRB): TOTAL RIGHT  KNEE ARTHROPLASTY (Right) Patient reports pain as moderate.   Patient seen in rounds for Dr. Darrelyn HillockGioffre. Patient is still having some issues with fatigue. She is progressing slowly with PT. She has some concerns about her ability to go home as opposed to SNF. No issues overnight. No SOB or chest pain.    Objective: Vital signs in last 24 hours: Temp:  [97.4 F (36.3 C)-98.9 F (37.2 C)] 97.4 F (36.3 C) (04/16 0430) Pulse Rate:  [95-105] 95 (04/16 0430) Resp:  [16-20] 16 (04/16 0430) BP: (120-154)/(47-59) 120/47 mmHg (04/16 0430) SpO2:  [97 %-100 %] 99 % (04/16 0430)  Intake/Output from previous day:  Intake/Output Summary (Last 24 hours) at 09/12/14 0835 Last data filed at 09/12/14 0430  Gross per 24 hour  Intake   2020 ml  Output   3801 ml  Net  -1781 ml     Recent Labs  09/10/14 0508 09/11/14 0450 09/12/14 0432  HGB 11.4* 10.9* 10.2*    Recent Labs  09/11/14 0450 09/12/14 0432  WBC 14.6* 13.9*  RBC 3.46* 3.29*  HCT 32.4* 30.4*  PLT 172 153    Recent Labs  09/10/14 0508 09/11/14 0450  NA 136 133*  K 3.7 3.6  CL 99 93*  CO2 29 33*  BUN 11 8  CREATININE 0.52 0.58  GLUCOSE 114* 113*  CALCIUM 8.4 8.4    EXAM General - Patient is Alert and Oriented Extremity - Neurologically intact Intact pulses distally Dorsiflexion/Plantar flexion intact No cellulitis present Compartment soft Dressing/Incision - clean, dry, no drainage Motor Function - intact, moving foot and toes well on exam.   Past Medical History  Diagnosis Date  . Hypothyroidism   . Hypertension   . Arthritis   . DVT (deep venous thrombosis)   . Family history of blood clots   . Peripheral vascular disease   . Varicose veins   . Left bundle branch block     patient reports history of for last several years     Assessment/Plan: 3 Days Post-Op Procedure(s) (LRB): TOTAL RIGHT  KNEE ARTHROPLASTY (Right) Active  Problems:   History of total knee arthroplasty  Estimated body mass index is 26.15 kg/(m^2) as calculated from the following:   Height as of this encounter: 5' 3.5" (1.613 m).   Weight as of this encounter: 68.04 kg (150 lb). Advance diet Up with therapy D/C IV fluids  DVT Prophylaxis - Xarelto Weight-Bearing as tolerated  Will hold off on DC home. Will make preparations for SNF placement. Did discuss with patient that at this point, it may be Monday until she is placed in a rehab facility and at that point may be able to return home. Continue PT today.   Dimitri PedAmber Lilyannah Zuelke, PA-C Orthopaedic Surgery 09/12/2014, 8:35 AM

## 2014-09-12 NOTE — Progress Notes (Addendum)
Clinical Social Work Department BRIEF PSYCHOSOCIAL ASSESSMENT 09/12/2014  Patient:  Amber [name], Amber [name]     Account Number:  [id number]     Admit date:  09/09/2014  Clinical Social Worker:  Maryln Manuel  Date/Time:  09/12/2014 09:30 AM  Referred by:  Physician  Date Referred:  09/12/2014 Referred for  SNF Placement   Other Referral:   Interview type:  Patient Other interview type:   and patient daughter, Pam at bedside    PSYCHOSOCIAL DATA Living Status:  ALONE Admitted from facility:   Level of care:   Primary support name:  Pam Bally/daughter Primary support relationship to patient:  CHILD, ADULT Degree of support available:   adequate    CURRENT CONCERNS Current Concerns  Post-Acute Placement   Other Concerns:    SOCIAL WORK ASSESSMENT / PLAN CSW received referral from PA that it was hopeful that pt would progress to go home, but pt making slow progress and recommendation for rehab at SNF.    CSW met with pt and pt daughter, Jeannene Patella at bedside. CSW introduced self and explained role. Pt reports that she lives alone and does not feel safe returning home at this time. Pt daughter discussed that she lives in [place] and pt main support locally is pt sister who is older than her. CSW discussed with pt that per MD, pt nearing medical stability from the hospital and likely for discharge today or tomorrow. Pt expressed understanding. CSW discussed process of SNF search and pt agreeable to [facility] search. CSW prepared pt and pt daughter that options may be limited due to weekend, but there should be options available for pt to choose. Pt and pt daughter expressed understanding.    CSW completed FL2 and initiated SNF search to [facility].    CSW to follow up with pt and pt family re: SNF bed offers in order to facilitate pt discharge.    CSW to continue to follow to provide support and assist with pt discharge planning needs.   Assessment/plan status:  Psychosocial  Support/Ongoing Assessment of Needs Other assessment/ plan:   discharge planning   Information/referral to community resources:   [facility] list    PATIENT'S/FAMILY'S RESPONSE TO PLAN OF CARE: Pt alert and oriented x 4.Pt has support from family, but family not able to provide physical assist for pt at home. Pt feels more comfortable with plan for rehab at SNF and confident that rehab at SNF will assist her in getting stronger in order to return home following rehab.     Alison Murray, MSW, LCSW Clinical Social Work Weekend coverage [phone]

## 2014-09-12 NOTE — Discharge Summary (Signed)
Physician Discharge Summary   Patient ID: Amber [name] MRN: [id number] DOB/AGE: 1940-12-19 74 y.o.  Admit date: 09/09/2014 Discharge date: tentative 09/12/2014  Primary Diagnosis: Primary osteoarthritis, right knee  Admission Diagnoses:  Past Medical History  Diagnosis Date  . Hypothyroidism   . Hypertension   . Arthritis   . DVT (deep venous thrombosis)   . Family history of blood clots   . Peripheral vascular disease   . Varicose veins   . Left bundle branch block     patient reports history of for last several years    Discharge Diagnoses:   Active Problems:   History of total knee arthroplasty  Estimated body mass index is 26.15 kg/(m^2) as calculated from the following:   Height as of this encounter: 5' 3.5" (1.613 m).   Weight as of this encounter: 68.04 kg (150 lb).  Procedure:  Procedure(s) (LRB): TOTAL RIGHT  KNEE ARTHROPLASTY (Right)   Consults: None  HPI: Amber [name], 75 y.o. female, has a history of pain and functional disability in the right knee due to arthritis and has failed non-surgical conservative treatments for greater than 12 weeks to includeNSAID's and/or analgesics, corticosteriod injections and activity modification. Onset of symptoms was gradual, starting 3 years ago with gradually worsening course since that time. The patient noted prior procedures on the knee to include arthroscopy and menisectomy on the right knee(s). Patient currently rates pain in the right knee(s) at 7 out of 10 with activity. Patient has night pain, worsening of pain with activity and weight bearing, pain that interferes with activities of daily living, pain with passive range of motion, crepitus and joint swelling. Patient has evidence of periarticular osteophytes and joint space narrowing by imaging studies. There is no active infection.  Laboratory Data: Admission on 09/09/2014  Component Date Value Ref Range Status  . WBC 09/10/2014 14.3* 4.0 - 10.5 K/uL Final  .  RBC 09/10/2014 3.69* 3.87 - 5.11 MIL/uL Final  . Hemoglobin 09/10/2014 11.4* 12.0 - 15.0 g/dL Final  . HCT 09/10/2014 33.7* 36.0 - 46.0 % Final  . MCV 09/10/2014 91.3  78.0 - 100.0 fL Final  . MCH 09/10/2014 30.9  26.0 - 34.0 pg Final  . MCHC 09/10/2014 33.8  30.0 - 36.0 g/dL Final  . RDW 09/10/2014 13.9  11.5 - 15.5 % Final  . Platelets 09/10/2014 166  150 - 400 K/uL Final  . Sodium 09/10/2014 136  135 - 145 mmol/L Final  . Potassium 09/10/2014 3.7  3.5 - 5.1 mmol/L Final  . Chloride 09/10/2014 99  96 - 112 mmol/L Final  . CO2 09/10/2014 29  19 - 32 mmol/L Final  . Glucose, Bld 09/10/2014 114* 70 - 99 mg/dL Final  . BUN 09/10/2014 11  6 - 23 mg/dL Final  . Creatinine, Ser 09/10/2014 0.52  0.50 - 1.10 mg/dL Final  . Calcium 09/10/2014 8.4  8.4 - 10.5 mg/dL Final  . GFR calc non Af Amer 09/10/2014 >90  >90 mL/min Final  . GFR calc Af Amer 09/10/2014 >90  >90 mL/min Final   Comment: (NOTE) The eGFR has been calculated using the CKD EPI equation. This calculation has not been validated in all clinical situations. eGFR's persistently <90 mL/min signify possible Chronic Kidney Disease.   . Anion gap 09/10/2014 8  5 - 15 Final  . WBC 09/11/2014 14.6* 4.0 - 10.5 K/uL Final  . RBC 09/11/2014 3.46* 3.87 - 5.11 MIL/uL Final  . Hemoglobin 09/11/2014 10.9* 12.0 - 15.0 g/dL Final  .  HCT 09/11/2014 32.4* 36.0 - 46.0 % Final  . MCV 09/11/2014 93.6  78.0 - 100.0 fL Final  . MCH 09/11/2014 31.5  26.0 - 34.0 pg Final  . MCHC 09/11/2014 33.6  30.0 - 36.0 g/dL Final  . RDW 09/11/2014 14.3  11.5 - 15.5 % Final  . Platelets 09/11/2014 172  150 - 400 K/uL Final  . Sodium 09/11/2014 133* 135 - 145 mmol/L Final  . Potassium 09/11/2014 3.6  3.5 - 5.1 mmol/L Final  . Chloride 09/11/2014 93* 96 - 112 mmol/L Final  . CO2 09/11/2014 33* 19 - 32 mmol/L Final  . Glucose, Bld 09/11/2014 113* 70 - 99 mg/dL Final  . BUN 09/11/2014 8  6 - 23 mg/dL Final  . Creatinine, Ser 09/11/2014 0.58  0.50 - 1.10 mg/dL Final   . Calcium 09/11/2014 8.4  8.4 - 10.5 mg/dL Final  . GFR calc non Af Amer 09/11/2014 87* >90 mL/min Final  . GFR calc Af Amer 09/11/2014 >90  >90 mL/min Final   Comment: (NOTE) The eGFR has been calculated using the CKD EPI equation. This calculation has not been validated in all clinical situations. eGFR's persistently <90 mL/min signify possible Chronic Kidney Disease.   . Anion gap 09/11/2014 7  5 - 15 Final  . WBC 09/12/2014 13.9* 4.0 - 10.5 K/uL Final  . RBC 09/12/2014 3.29* 3.87 - 5.11 MIL/uL Final  . Hemoglobin 09/12/2014 10.2* 12.0 - 15.0 g/dL Final  . HCT 09/12/2014 30.4* 36.0 - 46.0 % Final  . MCV 09/12/2014 92.4  78.0 - 100.0 fL Final  . MCH 09/12/2014 31.0  26.0 - 34.0 pg Final  . MCHC 09/12/2014 33.6  30.0 - 36.0 g/dL Final  . RDW 09/12/2014 13.7  11.5 - 15.5 % Final  . Platelets 09/12/2014 153  150 - 400 K/uL Final  Hospital Outpatient Visit on 09/01/2014  Component Date Value Ref Range Status  . aPTT 09/01/2014 37  24 - 37 seconds Final   Comment:        IF BASELINE aPTT IS ELEVATED, SUGGEST PATIENT RISK ASSESSMENT BE USED TO DETERMINE APPROPRIATE ANTICOAGULANT THERAPY.   . WBC 09/01/2014 7.7  4.0 - 10.5 K/uL Final  . RBC 09/01/2014 4.42  3.87 - 5.11 MIL/uL Final  . Hemoglobin 09/01/2014 13.6  12.0 - 15.0 g/dL Final  . HCT 09/01/2014 40.8  36.0 - 46.0 % Final  . MCV 09/01/2014 92.3  78.0 - 100.0 fL Final  . MCH 09/01/2014 30.8  26.0 - 34.0 pg Final  . MCHC 09/01/2014 33.3  30.0 - 36.0 g/dL Final  . RDW 09/01/2014 14.0  11.5 - 15.5 % Final  . Platelets 09/01/2014 196  150 - 400 K/uL Final  . Neutrophils Relative % 09/01/2014 67  43 - 77 % Final  . Neutro Abs 09/01/2014 5.1  1.7 - 7.7 K/uL Final  . Lymphocytes Relative 09/01/2014 26  12 - 46 % Final  . Lymphs Abs 09/01/2014 2.0  0.7 - 4.0 K/uL Final  . Monocytes Relative 09/01/2014 6  3 - 12 % Final  . Monocytes Absolute 09/01/2014 0.5  0.1 - 1.0 K/uL Final  . Eosinophils Relative 09/01/2014 1  0 - 5 % Final    . Eosinophils Absolute 09/01/2014 0.1  0.0 - 0.7 K/uL Final  . Basophils Relative 09/01/2014 0  0 - 1 % Final  . Basophils Absolute 09/01/2014 0.0  0.0 - 0.1 K/uL Final  . Sodium 09/01/2014 138  135 - 145 mmol/L Final  .  Potassium 09/01/2014 3.4* 3.5 - 5.1 mmol/L Final  . Chloride 09/01/2014 98  96 - 112 mmol/L Final  . CO2 09/01/2014 29  19 - 32 mmol/L Final  . Glucose, Bld 09/01/2014 195* 70 - 99 mg/dL Final  . BUN 09/01/2014 17  6 - 23 mg/dL Final  . Creatinine, Ser 09/01/2014 0.65  0.50 - 1.10 mg/dL Final  . Calcium 09/01/2014 9.3  8.4 - 10.5 mg/dL Final  . Total Protein 09/01/2014 6.8  6.0 - 8.3 g/dL Final  . Albumin 09/01/2014 4.1  3.5 - 5.2 g/dL Final  . AST 09/01/2014 24  0 - 37 U/L Final  . ALT 09/01/2014 19  0 - 35 U/L Final  . Alkaline Phosphatase 09/01/2014 68  39 - 117 U/L Final  . Total Bilirubin 09/01/2014 0.7  0.3 - 1.2 mg/dL Final  . GFR calc non Af Amer 09/01/2014 84* >90 mL/min Final  . GFR calc Af Amer 09/01/2014 >90  >90 mL/min Final   Comment: (NOTE) The eGFR has been calculated using the CKD EPI equation. This calculation has not been validated in all clinical situations. eGFR's persistently <90 mL/min signify possible Chronic Kidney Disease.   . Anion gap 09/01/2014 11  5 - 15 Final  . Prothrombin Time 09/01/2014 14.3  11.6 - 15.2 seconds Final  . INR 09/01/2014 1.09  0.00 - 1.49 Final  . ABO/RH(D) 09/01/2014 O POS   Final  . Antibody Screen 09/01/2014 NEG   Final  . Sample Expiration 09/01/2014 09/12/2014   Final  . Color, Urine 09/01/2014 YELLOW  YELLOW Final  . APPearance 09/01/2014 CLOUDY* CLEAR Final  . Specific Gravity, Urine 09/01/2014 1.025  1.005 - 1.030 Final  . pH 09/01/2014 5.5  5.0 - 8.0 Final  . Glucose, UA 09/01/2014 NEGATIVE  NEGATIVE mg/dL Final  . Hgb urine dipstick 09/01/2014 NEGATIVE  NEGATIVE Final  . Bilirubin Urine 09/01/2014 NEGATIVE  NEGATIVE Final  . Ketones, ur 09/01/2014 NEGATIVE  NEGATIVE mg/dL Final  . Protein, ur  09/01/2014 NEGATIVE  NEGATIVE mg/dL Final  . Urobilinogen, UA 09/01/2014 0.2  0.0 - 1.0 mg/dL Final  . Nitrite 09/01/2014 NEGATIVE  NEGATIVE Final  . Leukocytes, UA 09/01/2014 LARGE* NEGATIVE Final  . MRSA, PCR 09/01/2014 NEGATIVE  NEGATIVE Final  . Staphylococcus aureus 09/01/2014 NEGATIVE  NEGATIVE Final   Comment:        The Xpert SA Assay (FDA approved for NASAL specimens in patients over 66 years of age), is one component of a comprehensive surveillance program.  Test performance has been validated by [facility] for patients greater than or equal to 73 year old. It is not intended to diagnose infection nor to guide or monitor treatment.   . Squamous Epithelial / LPF 09/01/2014 MANY* RARE Final  . WBC, UA 09/01/2014 11-20  <3 WBC/hpf Final  . RBC / HPF 09/01/2014 0-2  <3 RBC/hpf Final  . Bacteria, UA 09/01/2014 RARE  RARE Final     X-Rays:Dg Chest 2 View  09/01/2014   CLINICAL DATA:  Preoperative chest radiograph.  Knee surgery.  EXAM: CHEST  2 VIEW  COMPARISON:  06/23/2011.  FINDINGS: Cardiopericardial silhouette within normal limits. Mediastinal contours normal. Trachea midline. No airspace disease or effusion.  IMPRESSION: No active cardiopulmonary disease.   Electronically Signed   By: Dereck Ligas M.D.   On: 09/01/2014 12:35   Ct Angio Chest Pe W/cm &/or Wo Cm  09/11/2014   CLINICAL DATA:  Hypoxia, status post right knee surgery on 09/09/2014, evaluate for  PE  EXAM: CT ANGIOGRAPHY CHEST WITH CONTRAST  TECHNIQUE: Multidetector CT imaging of the chest was performed using the standard protocol during bolus administration of intravenous contrast. Multiplanar CT image reconstructions and MIPs were obtained to evaluate the vascular anatomy.  CONTRAST:  164m OMNIPAQUE IOHEXOL 350 MG/ML SOLN  COMPARISON:  Chest radiographs dated 09/01/2014  FINDINGS: No evidence of pulmonary embolism.  Mediastinum/Nodes: Heart is normal in size. No pericardial effusion.  Atherosclerotic  calcifications of the aortic arch.  No suspicious mediastinal lymphadenopathy.  Visualized thyroid is unremarkable.  Lungs/Pleura: Mild dependent atelectasis in the bilateral lower lobes.  Lungs are otherwise clear.  No focal consolidation.  No suspicious pulmonary nodules.  No pleural effusion or pneumothorax.  Upper abdomen: Visualized upper abdomen is within normal limits.  Musculoskeletal: Mild degenerative changes of the mid thoracic spine.  Review of the MIP images confirms the above findings.  IMPRESSION: No evidence of pulmonary embolism.  No evidence of acute cardiopulmonary disease.   Electronically Signed   By: SJulian HyM.D.   On: 09/11/2014 14:17    EKG: Orders placed or performed during the hospital encounter of 09/01/14  . EKG  . EKG     Hospital Course: PARLIN [facility]is a 74y.o. [facility] were brought to the operating room on 09/09/2014 and underwent Procedure(s): TOTAL RIGHT  KNEE ARTHROPLASTY.  Patient tolerated the procedure well and was later transferred to the recovery room and then to the orthopaedic floor for postoperative care.  They were given PO and IV analgesics for pain control following their surgery.  They were given 24 hours of postoperative antibiotics of  Anti-infectives    Start     Dose/Rate Route Frequency Ordered Stop   09/09/14 1500  ceFAZolin (ANCEF) IVPB 1 g/50 mL premix     1 g 100 mL/hr over 30 Minutes Intravenous Every 6 hours 09/09/14 1238 09/09/14 2036   09/09/14 0945  polymyxin B 500,000 Units, bacitracin 50,000 Units in sodium chloride irrigation 0.9 % 500 mL irrigation  Status:  Discontinued       As needed 09/09/14 0955 09/09/14 1048   09/09/14 0643  ceFAZolin (ANCEF) IVPB 2 g/50 mL premix     2 g 100 mL/hr over 30 Minutes Intravenous On call to O.R. 09/09/14 [id number]     and started on DVT prophylaxis in the form of Xarelto.   PT and OT were ordered for total joint protocol.  Discharge  planning consulted to help with postop disposition and equipment needs.  Patient had a difficult night on the evening of surgery with poor pain control and significant sedation from medication.  They started to get up OOB with therapy on day one. Hemovac drain was pulled without difficulty.  Continued to work with therapy into day two she progressed very slowly. Due to continued problems with desat O2 and tachycardia, CT angio was ordered, which was negative.    By day three, the patient had still not progressed with therapy. There was some concern about the patient's ability to go home. Preparations for SNF placement were made and patient discharge was determinate on date and time of bed offer.    Diet: Cardiac diet Activity:WBAT Follow-up:in 2 weeks Disposition - Skilled nursing facility Discharged Condition: stable   Discharge Instructions    Call MD / Call 911    Complete by:  As directed   If you experience chest pain or shortness of breath, CALL 911  and be transported to the hospital emergency room.  If you develope a fever above 101 F, pus (white drainage) or increased drainage or redness at the wound, or calf pain, call your surgeon's office.     Constipation Prevention    Complete by:  As directed   Drink plenty of fluids.  Prune juice may be helpful.  You may use a stool softener, such as Colace (over the counter) 100 mg twice a day.  Use MiraLax (over the counter) for constipation as needed.     Diet - low sodium heart healthy    Complete by:  As directed      Discharge instructions    Complete by:  As directed   INSTRUCTIONS AFTER JOINT REPLACEMENT   Remove items at home which could result in a fall. This includes throw rugs or furniture in walking pathways ICE to the affected joint every three hours while awake for 30 minutes at a time, for at least the first 3-5 days, and then as needed for pain and swelling.  Continue to use ice for pain and swelling. You may notice swelling  that will progress down to the foot and ankle.  This is normal after surgery.  Elevate your leg when you are not up walking on it.   Continue to use the breathing machine you got in the hospital (incentive spirometer) which will help keep your temperature down.  It is common for your temperature to cycle up and down following surgery, especially at night when you are not up moving around and exerting yourself.  The breathing machine keeps your lungs expanded and your temperature down.   DIET:  As you were doing prior to hospitalization, we recommend a well-balanced diet.  DRESSING / WOUND CARE / SHOWERING  Keep the surgical dressing until follow up.  The dressing is water proof, so you can shower without any extra covering.  IF THE DRESSING FALLS OFF or the wound gets wet inside, change the dressing with sterile gauze.  Please use good hand washing techniques before changing the dressing.  Do not use any lotions or creams on the incision until instructed by your surgeon.    ACTIVITY  Increase activity slowly as tolerated, but follow the weight bearing instructions below.   No driving for 6 weeks or until further direction given by your physician.  You cannot drive while taking narcotics.  No lifting or carrying greater than 10 lbs. until further directed by your surgeon. Avoid periods of inactivity such as sitting longer than an hour when not asleep. This helps prevent blood clots.  You may return to work once you are authorized by your doctor.     WEIGHT BEARING   Weight bearing as tolerated with assist device (walker, cane, etc) as directed, use it as long as suggested by your surgeon or therapist, typically at least 4-6 weeks.   EXERCISES  Results after joint replacement surgery are often greatly improved when you follow the exercise, range of motion and muscle strengthening exercises prescribed by your doctor. Safety measures are also important to protect the joint from further injury.  Any time any of these exercises cause you to have increased pain or swelling, decrease what you are doing until you are comfortable again and then slowly increase them. If you have problems or questions, call your caregiver or physical therapist for advice.   Rehabilitation is important following a joint replacement. After just a few days of immobilization, the muscles of the leg  can become weakened and shrink (atrophy).  These exercises are designed to build up the tone and strength of the thigh and leg muscles and to improve motion. Often times heat used for twenty to thirty minutes before working out will loosen up your tissues and help with improving the range of motion but do not use heat for the first two weeks following surgery (sometimes heat can increase post-operative swelling).   These exercises can be done on a training (exercise) mat, on the floor, on a table or on a bed. Use whatever works the best and is most comfortable for you.    Use music or television while you are exercising so that the exercises are a pleasant break in your day. This will make your life better with the exercises acting as a break in your routine that you can look forward to.   Perform all exercises about fifteen times, three times per day or as directed.  You should exercise both the operative leg and the other leg as well.   Exercises include:   Quad Sets - Tighten up the muscle on the front of the thigh (Quad) and hold for 5-10 seconds.   Straight Leg Raises - With your knee straight (if you were given a brace, keep it on), lift the leg to 60 degrees, hold for 3 seconds, and slowly lower the leg.  Perform this exercise against resistance later as your leg gets stronger.  Leg Slides: Lying on your back, slowly slide your foot toward your buttocks, bending your knee up off the floor (only go as far as is comfortable). Then slowly slide your foot back down until your leg is flat on the floor again.  Angel Wings: Lying  on your back spread your legs to the side as far apart as you can without causing discomfort.  Hamstring Strength:  Lying on your back, push your heel against the floor with your leg straight by tightening up the muscles of your buttocks.  Repeat, but this time bend your knee to a comfortable angle, and push your heel against the floor.  You may put a pillow under the heel to make it more comfortable if necessary.   A rehabilitation program following joint replacement surgery can speed recovery and prevent re-injury in the future due to weakened muscles. Contact your doctor or a physical therapist for more information on knee rehabilitation.    CONSTIPATION  Constipation is defined medically as fewer than three stools per week and severe constipation as less than one stool per week.  Even if you have a regular bowel pattern at home, your normal regimen is likely to be disrupted due to multiple reasons following surgery.  Combination of anesthesia, postoperative narcotics, change in appetite and fluid intake all can affect your bowels.   YOU MUST use at least one of the following options; they are listed in order of increasing strength to get the job done.  They are all available over the counter, and you may need to use some, POSSIBLY even all of these options:    Drink plenty of fluids (prune juice may be helpful) and high fiber foods Colace 100 mg by mouth twice a day  Senokot for constipation as directed and as needed Dulcolax (bisacodyl), take with full glass of water  Miralax (polyethylene glycol) once or twice a day as needed.  If you have tried all these things and are unable to have a bowel movement in the first 3-4 days after surgery  call either your surgeon or your primary doctor.    If you experience loose stools or diarrhea, hold the medications until you stool forms back up.  If your symptoms do not get better within 1 week or if they get worse, check with your doctor.  If you  experience "the worst abdominal pain ever" or develop nausea or vomiting, please contact the office immediately for further recommendations for treatment.   ITCHING:  If you experience itching with your medications, try taking only a single pain pill, or even half a pain pill at a time.  You can also use Benadryl over the counter for itching or also to help with sleep.   TED HOSE STOCKINGS:  Use stockings on both legs until for at least 2 weeks or as directed by physician office. They may be removed at night for sleeping.  MEDICATIONS:  See your medication summary on the "After Visit Summary" that nursing will review with you.  You may have some home medications which will be placed on hold until you complete the course of blood thinner medication.  It is important for you to complete the blood thinner medication as prescribed.  PRECAUTIONS:  If you experience chest pain or shortness of breath - call 911 immediately for transfer to the hospital emergency department.   If you develop a fever greater that 101 F, purulent drainage from wound, increased redness or drainage from wound, foul odor from the wound/dressing, or calf pain - CONTACT YOUR SURGEON.                                                   FOLLOW-UP APPOINTMENTS:  If you do not already have a post-op appointment, please call the office for an appointment to be seen by your surgeon.  Guidelines for how soon to be seen are listed in your "After Visit Summary", but are typically between 1-4 weeks after surgery.  OTHER INSTRUCTIONS:   Do not take vitamins or supplements while you are taking Xarelto  MAKE SURE YOU:  Understand these instructions.  Get help right away if you are not doing well or get worse.    Thank you for letting us be a part of your medical care team.  It is a privilege we respect greatly.  We hope these instructions will help you stay on track for a fast and full recovery!     Increase activity slowly as tolerated     Complete by:  As directed             Medication List    STOP taking these medications        acetaminophen 500 MG tablet  Commonly known as:  TYLENOL     aspirin EC 81 MG tablet     calcium citrate-vitamin D 500-400 MG-UNIT chewable tablet     clindamycin 300 MG capsule  Commonly known as:  CLEOCIN     meloxicam 15 MG tablet  Commonly known as:  MOBIC     pyridOXINE 100 MG tablet  Commonly known as:  VITAMIN B-6     vitamin B-12 1000 MCG tablet  Commonly known as:  CYANOCOBALAMIN     vitamin C 500 MG tablet  Commonly known as:  ASCORBIC ACID     Vitamin D-3 1000 UNITS Caps     VITAMIN E PO  TAKE these medications        diphenhydrAMINE 25 mg capsule  Commonly known as:  BENADRYL  Take 25 mg by mouth at bedtime.     ferrous sulfate 325 (65 FE) MG tablet  Take 1 tablet (325 mg total) by mouth 2 (two) times daily after a meal.     HYDROcodone-acetaminophen 7.5-325 MG per tablet  Commonly known as:  NORCO  Take 1 tablet by mouth every 4 (four) hours as needed for moderate pain.     levothyroxine 50 MCG tablet  Commonly known as:  SYNTHROID, LEVOTHROID  Take 50 mcg by mouth daily before breakfast.     losartan-hydrochlorothiazide 100-25 MG per tablet  Commonly known as:  HYZAAR  Take 1 tablet by mouth every morning.     methocarbamol 500 MG tablet  Commonly known as:  ROBAXIN  Take 1 tablet (500 mg total) by mouth every 6 (six) hours as needed for muscle spasms.     ondansetron 4 MG tablet  Commonly known as:  ZOFRAN  Take 1 tablet (4 mg total) by mouth every 6 (six) hours as needed for nausea.     rivaroxaban 10 MG Tabs tablet  Commonly known as:  XARELTO  Take 1 tablet (10 mg total) by mouth daily with breakfast.     simvastatin 20 MG tablet  Commonly known as:  ZOCOR  Take 20 mg by mouth every morning.           Follow-up Information    Follow up with GIOFFRE,RONALD A, MD. Schedule an appointment as soon as possible for a visit in 2  weeks.   Specialty:  Orthopedic Surgery   Contact information:   [address] [place] [address] [phone]       Follow up with [facility].   Why:  for physical therapy   Contact information:   [address] [place] [address] [phone]       Signed: Ardeen Jourdain, PA-C Orthopaedic Surgery 09/12/2014, 8:42 AM

## 2014-09-12 NOTE — Progress Notes (Signed)
[facility].   CSW facilitated pt discharge needs including contacting facility, faxing pt discharge information via [facility], discussing with pt and pt sister at bedside, providing RN phone number to call report, providing discharge packet to RN to provide to pt son to [facility] to facility. [facility] then plans to provide [facility].  Pt very appreciative of CSW support and assistance with locating SNF and facilitating d/c to SNF today. Pt feels that she is making the right decision by going to rehab at SNF before returning home.   No further social work needs identified at this time.  CSW signing off.   Loletta SpecterSuzanna Tarita Deshmukh, MSW, LCSW Clinical Social Work Weekend coverage [phone]

## 2014-09-12 NOTE — Progress Notes (Signed)
Physical Therapy Treatment Patient Details Name: Amber [name] MRN: [id number] DOB: 07-Jun-1940 Today's Date: 09/12/2014    History of Present Illness 74 yo female s/p R TKA 09/09/14. Hx of LBBB, DVT, PVD, HTN, bil shoulder surgery.     PT Comments    Progressing slowly with mobility. Pt/family have now decided on SNF placement. O2 sats 91% on RA during session today. Pain rated as 7/10 with activity.   Follow Up Recommendations  SNF;Supervision/Assistance - 24 hour (pt now discharging to SNF)     Equipment Recommendations  None recommended by PT    Recommendations for Other Services OT consult     Precautions / Restrictions Precautions Precautions: Fall Precaution Comments: monitor O2, HR.  Required Braces or Orthoses: Knee Immobilizer - Right Knee Immobilizer - Right: Discontinue once straight leg raise with < 10 degree lag Restrictions Weight Bearing Restrictions: No RLE Weight Bearing: Weight bearing as tolerated    Mobility  Bed Mobility Overal bed mobility: Needs Assistance Bed Mobility: Supine to Sit     Supine to sit: HOB elevated;Min assist     General bed mobility comments: Increased time. Assist for R LE. VCs safety, hand placement  Transfers Overall transfer level: Needs assistance Equipment used: Rolling walker (2 wheeled) Transfers: Sit to/from [employer] Sit to Stand: Mod assist;From elevated surface Stand pivot transfers: Min assist       General transfer comment: Assist to rise, stabilize, control descent. VCS safety, technique, hand placement  Ambulation/Gait Ambulation/Gait assistance: Min assist Ambulation Distance (Feet): 42 Feet Assistive device: Rolling walker (2 wheeled) Gait Pattern/deviations: Step-to pattern;Trunk flexed;Antalgic;Decreased step length - right     General Gait Details: Assist to stabilize and maneuver with RW. VCs safety, technique, sequence. slow gait speed. O2 sats 91% on RA during ambulation on  today.    Stairs            Wheelchair Mobility    Modified Rankin (Stroke Patients Only)       Balance                                    Cognition Arousal/Alertness: Awake/alert Behavior During Therapy: WFL for tasks assessed/performed Overall Cognitive Status: Within Functional Limits for tasks assessed                      Exercises Total Joint Exercises Ankle Circles/Pumps: AROM;Both;10 reps;Supine Quad Sets: AROM;Both;10 reps;Supine Heel Slides: AAROM;Right;10 reps;Supine Hip ABduction/ADduction: AAROM;Right;10 reps;Supine Straight Leg Raises: AAROM;Right;10 reps;Supine Goniometric ROM: ~10-50 degrees    General Comments        Pertinent Vitals/Pain Pain Assessment: 0-10 Pain Score: 7  Pain Location: R knee, thigh Pain Descriptors / Indicators: Sore Pain Intervention(s): Monitored during session;Ice applied;Repositioned    Home Living                      Prior Function            PT Goals (current goals can now be found in the care plan section) Progress towards PT goals: Progressing toward goals (slowly)    Frequency  7X/week    PT Plan Current plan remains appropriate    Co-evaluation             End of Session Equipment Utilized During Treatment: Gait belt;Right knee immobilizer Activity Tolerance: Patient limited by fatigue;Patient limited by pain Patient left: in bed;with call  bell/phone within reach;with family/visitor present     Time: [id number] PT Time Calculation (min) (ACUTE ONLY): 31 min  Charges:  $Gait Training: 8-22 mins $Therapeutic Exercise: 8-22 mins                    G Codes:      Rebeca AlertJannie Torey Reinard, MPT Pager: [phone]

## 2014-09-14 ENCOUNTER — Other Ambulatory Visit: Payer: Self-pay | Admitting: *Deleted

## 2014-09-14 ENCOUNTER — Encounter: Payer: Self-pay | Admitting: Adult Health

## 2014-09-14 ENCOUNTER — Non-Acute Institutional Stay (SKILLED_NURSING_FACILITY): Payer: Medicare Other | Admitting: Adult Health

## 2014-09-14 DIAGNOSIS — M1711 Unilateral primary osteoarthritis, right knee: Secondary | ICD-10-CM

## 2014-09-14 DIAGNOSIS — D62 Acute posthemorrhagic anemia: Secondary | ICD-10-CM | POA: Diagnosis not present

## 2014-09-14 DIAGNOSIS — I10 Essential (primary) hypertension: Secondary | ICD-10-CM | POA: Diagnosis not present

## 2014-09-14 DIAGNOSIS — D72829 Elevated white blood cell count, unspecified: Secondary | ICD-10-CM

## 2014-09-14 DIAGNOSIS — E785 Hyperlipidemia, unspecified: Secondary | ICD-10-CM | POA: Diagnosis not present

## 2014-09-14 DIAGNOSIS — E871 Hypo-osmolality and hyponatremia: Secondary | ICD-10-CM

## 2014-09-14 DIAGNOSIS — E039 Hypothyroidism, unspecified: Secondary | ICD-10-CM

## 2014-09-14 NOTE — Progress Notes (Signed)
Patient ID: Amber [name], female   DOB: Apr 19, 1941, 74 y.o.   MRN: [id number]   09/14/2014  Facility:  Nursing Home Location:  [facility] Room Number: 102-P LEVEL OF CARE:  SNF (31)   Chief Complaint  Patient presents with  . Hospitalization Follow-up    Osteoarthritis S/P right total knee arthroplasty, hypertension, anemia, hypothyroidism and hyperlipidemia    HISTORY OF PRESENT ILLNESS:  This is a 74 year old female who has been admitted to [facility] on 09/12/14 from [facility] with osteoarthritis S/P right total knee arthroplasty. She has PMH of hypothyroidism, hypertension, DVT, PVD, varicose veins and left bundle branch block. She has been admitted for a short-term rehabilitation.  She is seen today in her room with her sister and daughter-in-law at bedside. She is concerned about 2 blood pressure readings, on 175/90 and 162/70. Latest blood pressure reading is 125/76. No complaints of headache nor dizziness. Patient is requesting if her blood pressure can be closely monitored.  PAST MEDICAL HISTORY:  Past Medical History  Diagnosis Date  . Hypothyroidism   . Hypertension   . Arthritis   . DVT (deep venous thrombosis)   . Family history of blood clots   . Peripheral vascular disease   . Varicose veins   . Left bundle branch block     patient reports history of for last several years     CURRENT MEDICATIONS: Reviewed per MAR/see medication list  Allergies  Allergen Reactions  . Codeine Nausea And Vomiting  . Darvocet [Propoxyphene N-Acetaminophen] Nausea And Vomiting  . Nickel Rash  . Sulfa Antibiotics Rash    Like a sunburn     REVIEW OF SYSTEMS:  GENERAL: no change in appetite, no fatigue, no weight changes, no fever, chills or weakness RESPIRATORY: no cough, SOB, DOE, wheezing, hemoptysis CARDIAC: no chest pain, edema or palpitations GI: no abdominal pain, diarrhea, constipation, heart burn, nausea or  vomiting  PHYSICAL EXAMINATION  GENERAL: no acute distress, normal body habitus SKIN:  Right knee surgical site is covered. Aquacel dressing, dry, no redness EYES: conjunctivae normal, sclerae normal, normal eye lids NECK: supple, trachea midline, no neck masses, no thyroid tenderness, no thyromegaly LYMPHATICS: no LAN in the neck, no supraclavicular LAN RESPIRATORY: breathing is even & unlabored, BS CTAB CARDIAC: RRR, no murmur,no extra heart sounds, no edema GI: abdomen soft, normal BS, no masses, no tenderness, no hepatomegaly, no splenomegaly EXTREMITIES:  Able to move 4 extremities PSYCHIATRIC: the patient is alert & oriented to person, affect & behavior appropriate  LABS/RADIOLOGY: Labs reviewed: Basic Metabolic Panel:  Recent Labs  78/29/56 1105 09/10/14 0508 09/11/14 0450  NA 138 136 133*  K 3.4* 3.7 3.6  CL 98 99 93*  CO2 29 29 33*  GLUCOSE 195* 114* 113*  BUN CREATININE 0.65 0.52 0.58  CALCIUM 9.3 8.4 8.4   Liver Function Tests:  Recent Labs  09/01/14 1105  AST 24  ALT 19  ALKPHOS 68  BILITOT 0.7  PROT 6.8  ALBUMIN 4.1   CBC:  Recent Labs  09/01/14 1105 09/10/14 0508 09/11/14 0450 09/12/14 0432  WBC 7.7 14.3* 14.6* 13.9*  NEUTROABS 5.1  --   --   --   HGB 13.6 11.4* 10.9* 10.2*  HCT 40.8 33.7* 32.4* 30.4*  MCV 92.3 91.3 93.6 92.4  PLT 196 166 172 153    Dg Chest 2 View  09/01/2014   CLINICAL DATA:  Preoperative chest radiograph.  Knee  surgery.  EXAM: CHEST  2 VIEW  COMPARISON:  06/23/2011.  FINDINGS: Cardiopericardial silhouette within normal limits. Mediastinal contours normal. Trachea midline. No airspace disease or effusion.  IMPRESSION: No active cardiopulmonary disease.   Electronically Signed   By: Andreas NewportGeoffrey  Lamke M.D.   On: 09/01/2014 12:35   Ct Angio Chest Pe W/cm &/or Wo Cm  09/11/2014   CLINICAL DATA:  Hypoxia, status post right knee surgery on 09/09/2014, evaluate for PE  EXAM: CT ANGIOGRAPHY CHEST WITH CONTRAST   TECHNIQUE: Multidetector CT imaging of the chest was performed using the standard protocol during bolus administration of intravenous contrast. Multiplanar CT image reconstructions and MIPs were obtained to evaluate the vascular anatomy.  CONTRAST:  100mL OMNIPAQUE IOHEXOL 350 MG/ML SOLN  COMPARISON:  Chest radiographs dated 09/01/2014  FINDINGS: No evidence of pulmonary embolism.  Mediastinum/Nodes: Heart is normal in size. No pericardial effusion.  Atherosclerotic calcifications of the aortic arch.  No suspicious mediastinal lymphadenopathy.  Visualized thyroid is unremarkable.  Lungs/Pleura: Mild dependent atelectasis in the bilateral lower lobes.  Lungs are otherwise clear.  No focal consolidation.  No suspicious pulmonary nodules.  No pleural effusion or pneumothorax.  Upper abdomen: Visualized upper abdomen is within normal limits.  Musculoskeletal: Mild degenerative changes of the mid thoracic spine.  Review of the MIP images confirms the above findings.  IMPRESSION: No evidence of pulmonary embolism.  No evidence of acute cardiopulmonary disease.   Electronically Signed   By: Charline BillsSriyesh  Krishnan M.D.   On: 09/11/2014 14:17    ASSESSMENT/PLAN:  Osteoarthritis S/P right total knee arthroplasty - for rehabilitation; continue Xarelto 10 mg 1 tab by mouth daily and start TED hose, knee-high, on in a.m. and off at at bedtime for DVT prophylaxis; Robaxin 500 mg by mouth every 6 hours when necessary for muscle spasm; and Norco 7.5/325 mg 1 tab by mouth every 4 hours when necessary for pain Hypertension - continue Hyzaar 100-25 mg by mouth daily; BP/HR Q shift X 1 week Hypothyroidism - continue Synthroid 50 g 1 tab by mouth daily Anemia - hemoglobin 10.2; continue ferrous sulfate 325 mg by mouth twice a day Hyperlipidemia - continue Zocor 20 mg by mouth daily Leukocytosis - will monitor Hyponatremia - will monitor   Goals of care:  Short-term rehabilitation   Labs/test ordered:  CBC, CMP and  tsh  Spent 50 minutes in patient care.     [facility],Leili Eskenazi, NP [facility] [phone]

## 2014-09-15 ENCOUNTER — Non-Acute Institutional Stay (SKILLED_NURSING_FACILITY): Payer: Medicare Other | Admitting: Internal Medicine

## 2014-09-15 DIAGNOSIS — I10 Essential (primary) hypertension: Secondary | ICD-10-CM

## 2014-09-15 DIAGNOSIS — E785 Hyperlipidemia, unspecified: Secondary | ICD-10-CM | POA: Diagnosis not present

## 2014-09-15 DIAGNOSIS — M1711 Unilateral primary osteoarthritis, right knee: Secondary | ICD-10-CM | POA: Diagnosis not present

## 2014-09-15 DIAGNOSIS — K5901 Slow transit constipation: Secondary | ICD-10-CM | POA: Diagnosis not present

## 2014-09-15 DIAGNOSIS — D62 Acute posthemorrhagic anemia: Secondary | ICD-10-CM

## 2014-09-15 DIAGNOSIS — E039 Hypothyroidism, unspecified: Secondary | ICD-10-CM | POA: Diagnosis not present

## 2014-09-15 DIAGNOSIS — D72829 Elevated white blood cell count, unspecified: Secondary | ICD-10-CM

## 2014-09-15 NOTE — Progress Notes (Signed)
Patient ID: Amber [name], female   DOB: 1940/06/12, 74 y.o.   MRN: [id number]     [facility]   PCP: BURNETT,BRENT A, MD   Allergies  Allergen Reactions  . Codeine Nausea And Vomiting  . Darvocet [Propoxyphene N-Acetaminophen] Nausea And Vomiting  . Nickel Rash  . Sulfa Antibiotics Rash    Like a sunburn    Chief Complaint  Patient presents with  . New Admit To SNF     HPI:  74 year old patient is here for short term rehabilitation post hospital admission from 09/09/14-09/12/14 with right knee OA. She underwent right total knee arthroplasty. Her pain is under control with current regimen. She has been working with therapy team. She denies any concerns.  Review of Systems:  Constitutional: Negative for fever, chills, diaphoresis. positive for fatigue HENT: Negative for headache, congestion Eyes: Negative for eye pain, blurred vision, double vision and discharge.  Respiratory: Negative for cough, shortness of breath and wheezing.   Cardiovascular: Negative for chest pain, palpitations, leg swelling.  Gastrointestinal: Negative for heartburn, nausea, vomiting, abdominal pain. Last bowel movement 2 days back, at home had one everyday Genitourinary: Negative for dysuria Musculoskeletal: Negative for back pain, falls Skin: Negative for itching, rash.  Neurological: Negative for dizziness, tingling, focal weakness Psychiatric/Behavioral: Negative for depression.    Past Medical History  Diagnosis Date  . Hypothyroidism   . Hypertension   . Arthritis   . DVT (deep venous thrombosis)   . Family history of blood clots   . Peripheral vascular disease   . Varicose veins   . Left bundle branch block     patient reports history of for last several years    Past Surgical History  Procedure Laterality Date  . Appendectomy  1979    when did hysterectomy  . Abdominal hysterectomy  1979    partial done  . Back surgery    . Shoulder open rotator  cuff repair  07/05/2011    Procedure: ROTATOR CUFF REPAIR SHOULDER OPEN;  Surgeon: Jacki Cones, MD;  Location: [facility];  Service: Orthopedics;  Laterality: Right;  . Endovenous ablation saphenous vein w/ laser Right 09-15-2013    right greater saphenous vein by Gretta Began MD  . Endovenous ablation saphenous vein w/ laser Left 10-16-2013    endovenous laser ablation left greater saphenous vein by Gretta Began MD  . Left open rotator cuff surgery   2010   . Total knee arthroplasty Right 09/09/2014    Procedure: TOTAL RIGHT  KNEE ARTHROPLASTY;  Surgeon: Ranee Gosselin, MD;  Location: [facility]S;  Service: Orthopedics;  Laterality: Right;   Social History:   reports that she quit smoking about 50 years ago. Her smoking use included Cigarettes. She has a 3.25 pack-year smoking history. She has never used smokeless tobacco. She reports that she does not drink alcohol or use illicit drugs.  Family History  Problem Relation Age of Onset  . Heart disease Mother   . Cancer Mother   . Varicose Veins Mother   . Deep vein thrombosis Mother   . Diabetes Mother   . Hypertension Mother   . Heart disease Father   . Varicose Veins Sister   . Cancer Sister   . Diabetes Sister   . Heart disease Sister     before age 9  . Hypertension Sister   . Vision loss Sister   . Diabetes Brother   . Vision loss Brother   .  Peripheral vascular disease Brother   . AAA (abdominal aortic aneurysm) Brother   . Diabetes Sister   . Heart disease Sister   . Hypertension Sister   . Vision loss Sister   . Diabetes Sister   . Vision loss Sister     Medications: Patient's Medications  New Prescriptions   No medications on file  Previous Medications   DIPHENHYDRAMINE (BENADRYL) 25 MG CAPSULE    Take 25 mg by mouth at bedtime.   FERROUS SULFATE 325 (65 FE) MG TABLET    Take 1 tablet (325 mg total) by mouth 2 (two) times daily after a meal.   HYDROCODONE-ACETAMINOPHEN (NORCO) 7.5-325 MG PER TABLET    Take 1 tablet by  mouth every 4 (four) hours as needed for moderate pain.   LEVOTHYROXINE (SYNTHROID, LEVOTHROID) 50 MCG TABLET    Take 50 mcg by mouth daily before breakfast.   LOSARTAN-HYDROCHLOROTHIAZIDE (HYZAAR) 100-25 MG PER TABLET    Take 1 tablet by mouth every morning.    METHOCARBAMOL (ROBAXIN) 500 MG TABLET    Take 1 tablet (500 mg total) by mouth every 6 (six) hours as needed for muscle spasms.   ONDANSETRON (ZOFRAN) 4 MG TABLET    Take 1 tablet (4 mg total) by mouth every 6 (six) hours as needed for nausea.   RIVAROXABAN (XARELTO) 10 MG TABS TABLET    Take 1 tablet (10 mg total) by mouth daily with breakfast.   SIMVASTATIN (ZOCOR) 20 MG TABLET    Take 20 mg by mouth every morning.   Modified Medications   No medications on file  Discontinued Medications   No medications on file     Physical Exam: Filed Vitals:   09/15/14 1648  BP: 125/78  Pulse: 70  Temp: 97.8 F (36.6 C)  Resp: 18  SpO2: 98%    General- elderly female, obese, in no acute distress Head- normocephalic, atraumatic Throat- moist mucus membraneEyes- PERRLA, EOMI, no pallor, no icterus, no discharge, normal conjunctiva, normal sclera Neck- no cervical lymphadenopathy Cardiovascular- normal s1,s2, no murmurs, palpable dorsalis pedis and radial pulses, 1+ right leg edema Respiratory- bilateral clear to auscultation, no wheeze, no rhonchi, no crackles, no use of accessory muscles Abdomen- bowel sounds present, soft, non tender Musculoskeletal- able to move all 4 extremities, generalized weakness Neurological- no focal deficit Skin- warm and dry, aquacel dressing to right knee, mild erythema around right knee area Psychiatry- alert and oriented to person, place and time, normal mood and affect    Labs reviewed: Basic Metabolic Panel:  Recent Labs  16/10/96 1105 09/10/14 0508 09/11/14 0450  NA 138 136 133*  K 3.4* 3.7 3.6  CL 98 99 93*  CO2 29 29 33*  GLUCOSE 195* 114* 113*  BUN CREATININE 0.65 0.52 0.58   CALCIUM 9.3 8.4 8.4   Liver Function Tests:  Recent Labs  09/01/14 1105  AST 24  ALT 19  ALKPHOS 68  BILITOT 0.7  PROT 6.8  ALBUMIN 4.1   No results for input(s): LIPASE, AMYLASE in the last 8760 hours. No results for input(s): AMMONIA in the last 8760 hours. CBC:  Recent Labs  09/01/14 1105 09/10/14 0508 09/11/14 0450 09/12/14 0432  WBC 7.7 14.3* 14.6* 13.9*  NEUTROABS 5.1  --   --   --   HGB 13.6 11.4* 10.9* 10.2*  HCT 40.8 33.7* 32.4* 30.4*  MCV 92.3 91.3 93.6 92.4  PLT 196 166 172 153     Assessment/Plan  Right knee  Osteoarthritis  S/P right total knee arthroplasty. Will have her work with physical therapy and occupational therapy team to help with gait training and muscle strengthening exercises.fall precautions. Skin care. Encourage to be out of bed. Continue Xarelto 10 mg daily for dvt prophylaxis, to wear ted hose to help with edema. Continue norco 7.5-325 1 tab q4h prn for pain and robaxin 500 mg q6h prn muscle spasm. Has f/u with orthopedics  Constipation Her iron and pain medication are likely contributing to it. Add colace 100 mg bid and daily miralax x 3 days, then prn only. reassess  Blood loss anemia Continue ferrous sulfate 325 mg bid, monitor h&h  Leukocytosis Likely from stress response, monitor cbc with diff  Hypertension Stable, continue Hyzaar 100-25 mg daily, monitor bp  Hypothyroidism continue Synthroid 50 mcg daily  Hyperlipidemia continue Zocor 20 mg daily   Goals of care: short term rehabilitation   Labs/tests ordered: cbc, cmp  Family/ staff Communication: reviewed care plan with patient and nursing supervisor    Oneal GroutMAHIMA Lavert Matousek, MD  [facility] [phone] (Monday-Friday 8 am - 5 pm) [phone] (afterhours)

## 2015-04-18 IMAGING — US US EXTREM LOW VENOUS*R*
1 series · 13 of 24 positions shown · non-contrast
Comparison: None.

CLINICAL DATA: Right lower extremity pain and swelling, remote
history of DVT, history of varicose veins, evaluate for right lower
extremity DVT.

RIGHT LOWER EXTREMITY VENOUS DUPLEX ULTRASOUND
TECHNIQUE: Gray-scale sonography with graded compression, as well
as color Doppler and duplex ultrasound were performed to evaluate
the deep venous system of the lower extremity from the level of the
common femoral vein through the popliteal and proximal calf veins.
Spectral Doppler was utilized to evaluate flow at rest and with
distal augmentation maneuvers.

[Series 1: us extrem low venous*right* · 13 of 41 slices shown]
[im 1/41]
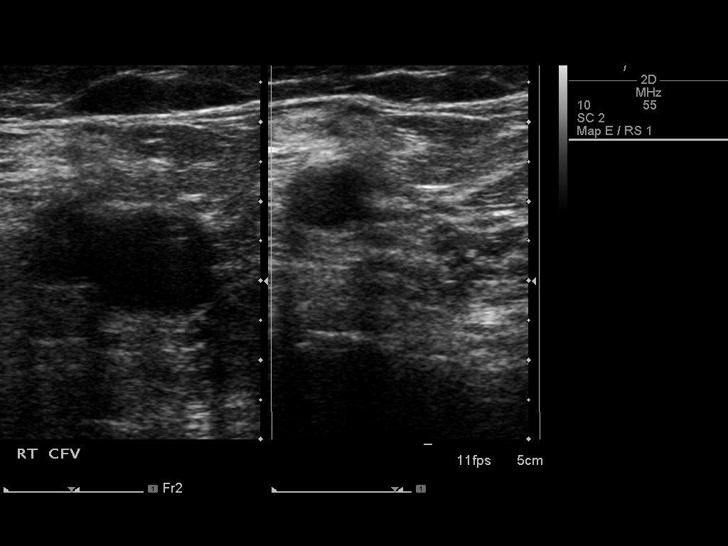
[im 4/41]
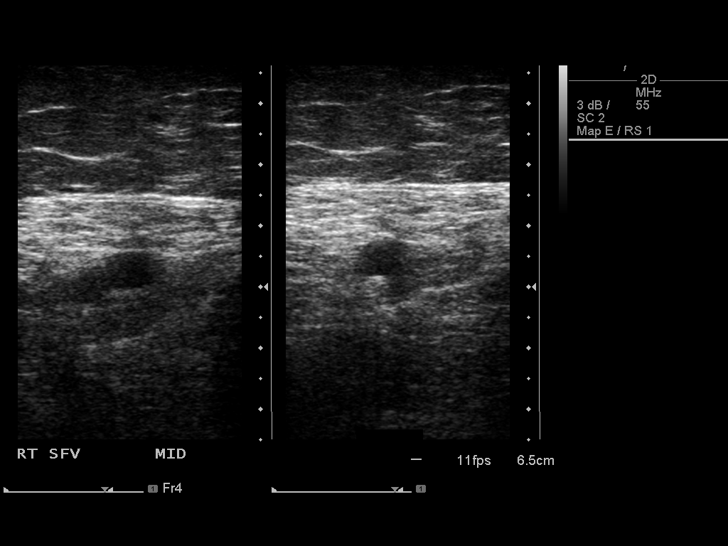
[im 7/41]
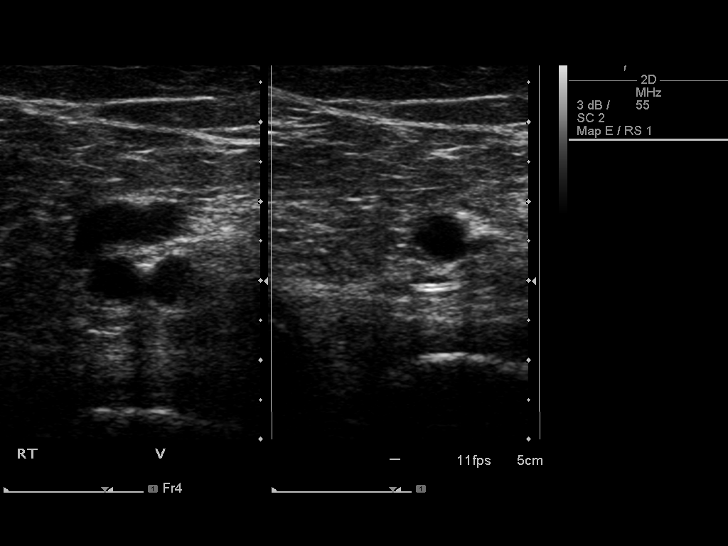
[im 11/41]
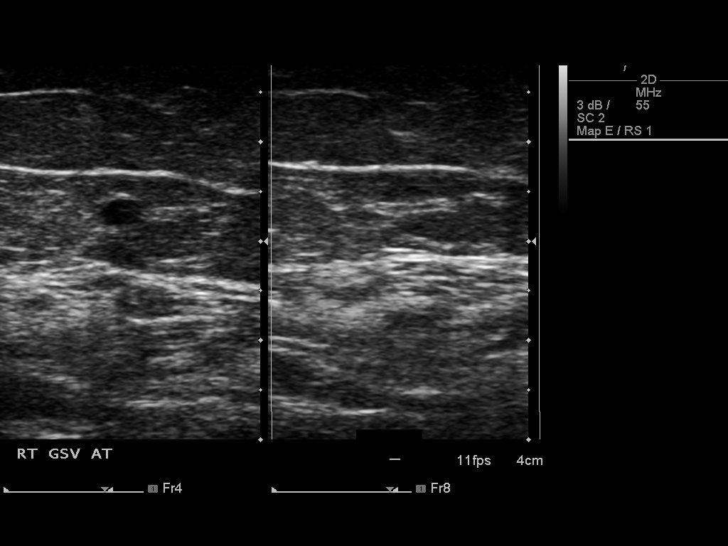
[im 14/41]
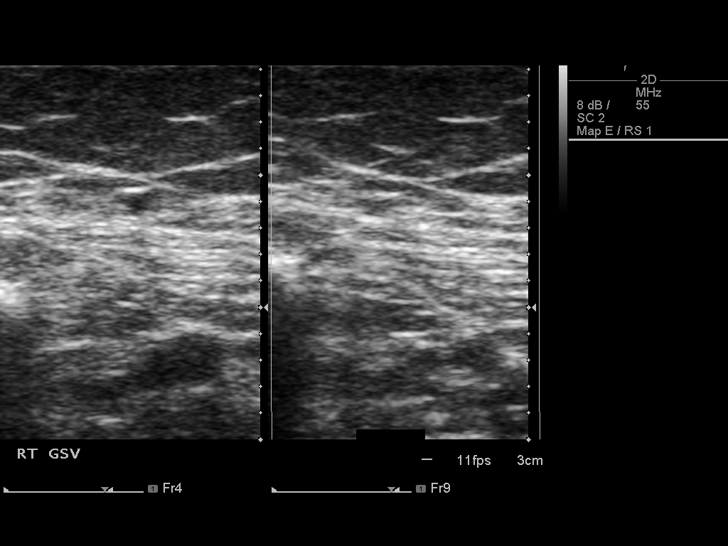
[im 18/41]
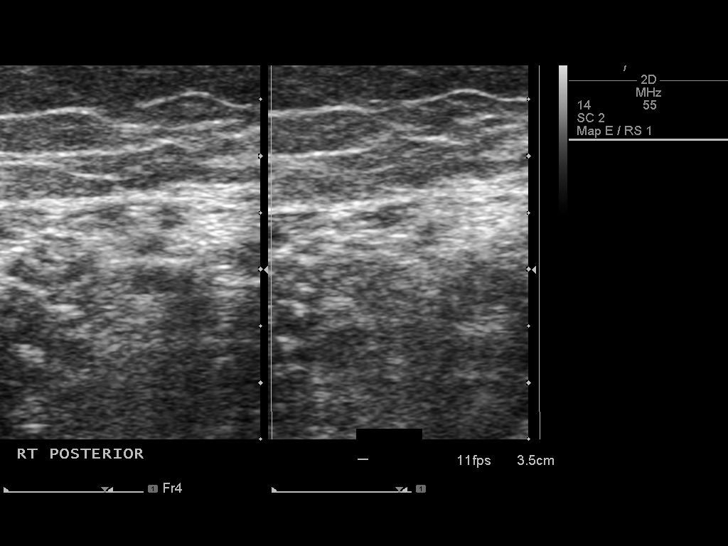
[im 21/41]
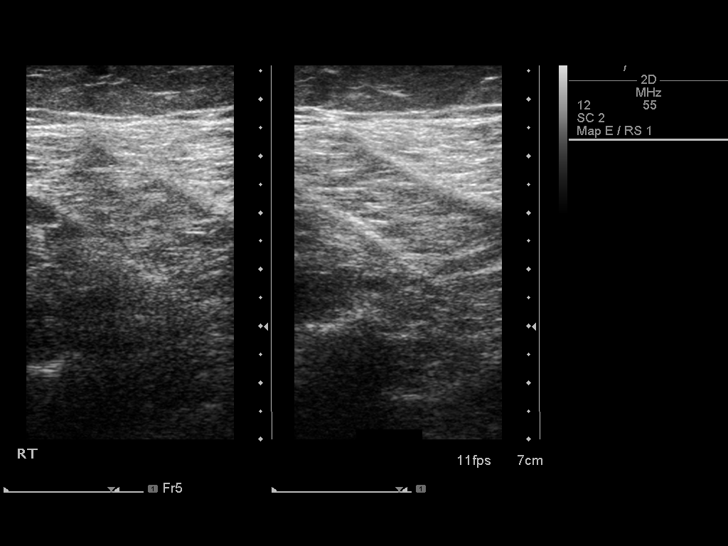
[im 23/41]
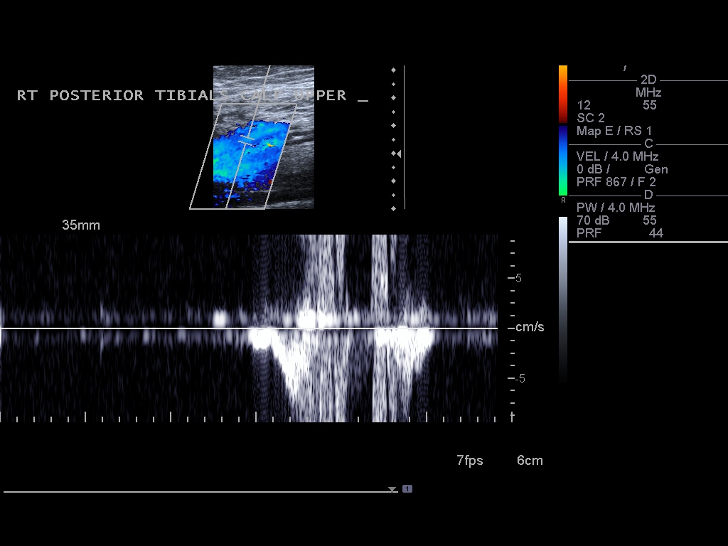
[im 27/41]
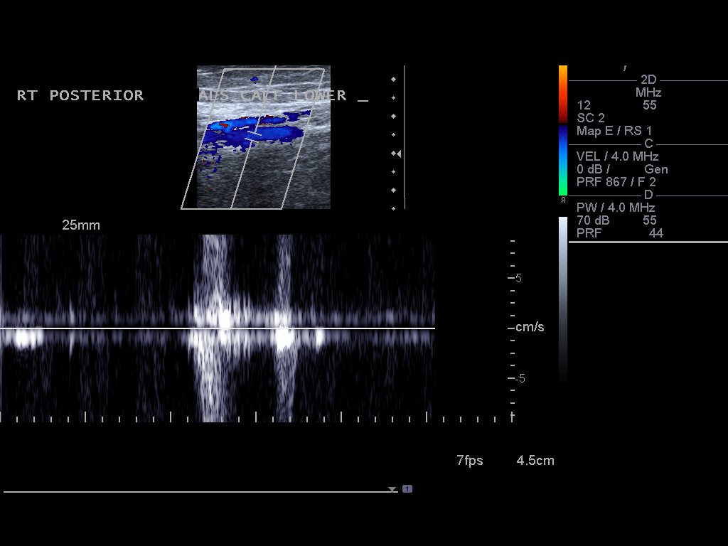
[im 30/41]
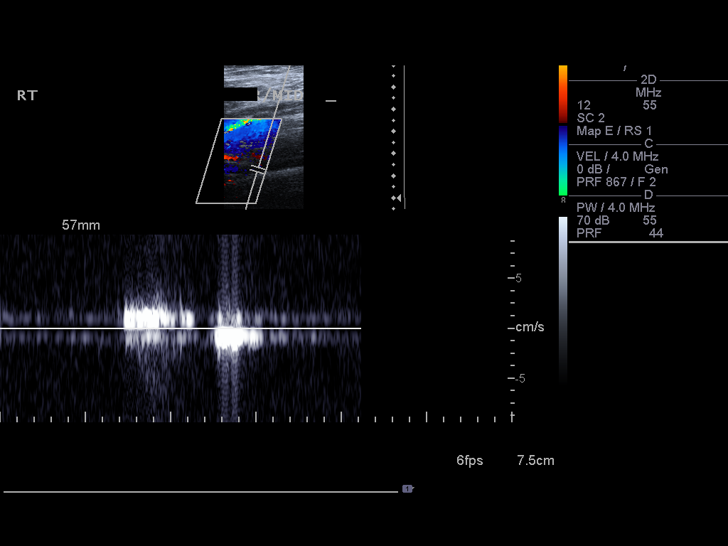
[im 34/41]
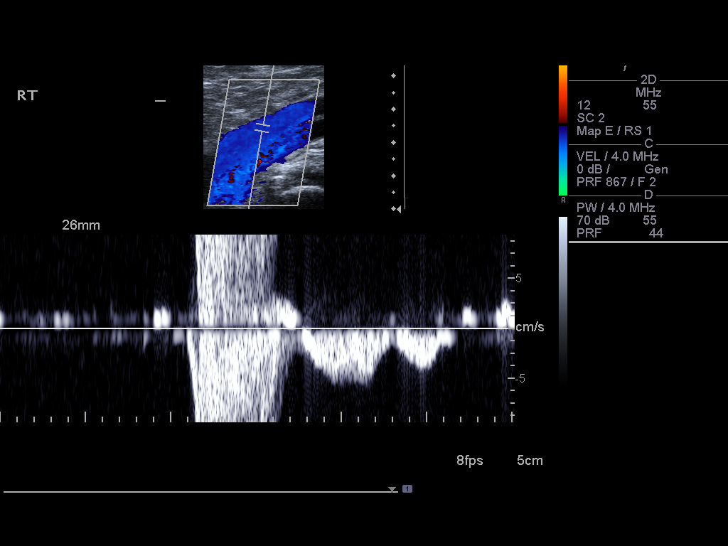
[im 37/41]
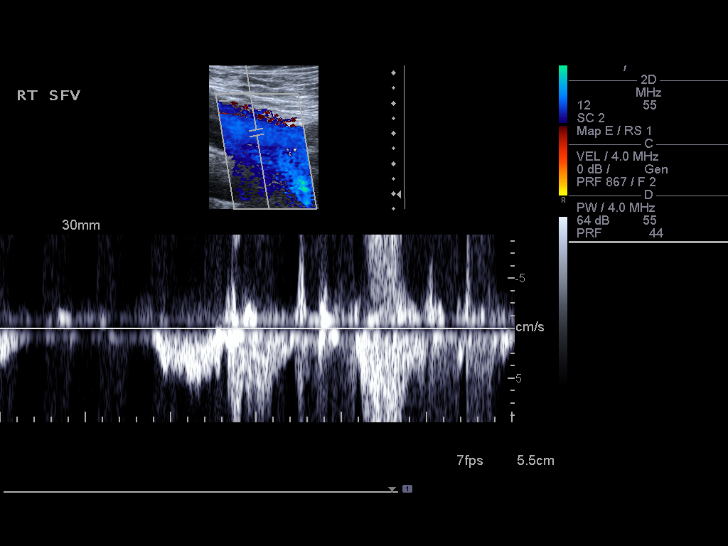
[im 41/41]
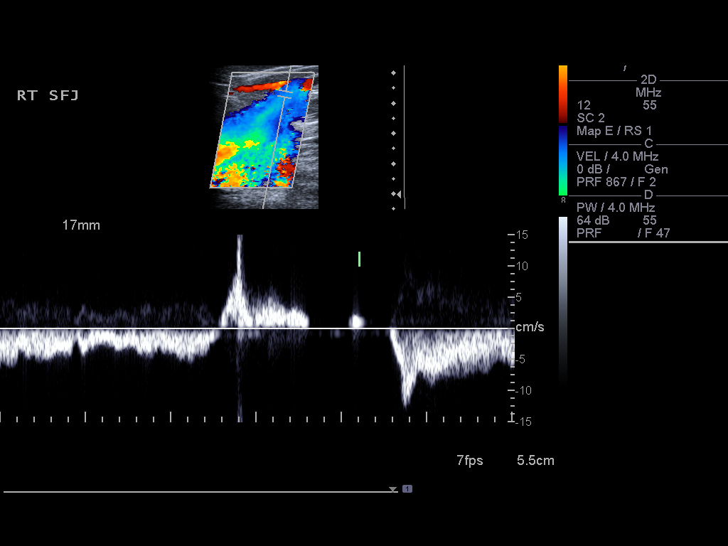

[13 of 24 positions shown; findings below may reference images not displayed]

FINDINGS: Normal compressibility of the common femoral,
superficial femoral, and popliteal veins is demonstrated, as well
as the visualized proximal calf veins.  No filling defects to
suggest DVT on grayscale or color Doppler imaging.  Doppler
waveforms show normal direction of venous flow, normal respiratory
phasicity and response to augmentation.

Note is made of a mildly prominent but widely patent and
compressible calf varicosity.  This finding is without associated
significant enlargement of the greater saphenous vein.
IMPRESSION: No evidence of deep vein thrombosis within the right lower
extremity.

## 2015-10-13 ENCOUNTER — Other Ambulatory Visit: Payer: Self-pay

## 2015-10-13 DIAGNOSIS — Z1231 Encounter for screening mammogram for malignant neoplasm of breast: Secondary | ICD-10-CM

## 2015-10-29 ENCOUNTER — Ambulatory Visit
Admission: RE | Admit: 2015-10-29 | Discharge: 2015-10-29 | Disposition: A | Discharge status: Home / Self Care | Payer: Medicare Other | Source: Ambulatory Visit

## 2015-10-29 DIAGNOSIS — Z1231 Encounter for screening mammogram for malignant neoplasm of breast: Secondary | ICD-10-CM

## 2015-12-08 DIAGNOSIS — I447 Left bundle-branch block, unspecified: Secondary | ICD-10-CM | POA: Insufficient documentation

## 2015-12-08 DIAGNOSIS — E669 Obesity, unspecified: Secondary | ICD-10-CM | POA: Insufficient documentation

## 2015-12-08 DIAGNOSIS — I10 Essential (primary) hypertension: Secondary | ICD-10-CM | POA: Insufficient documentation

## 2015-12-08 DIAGNOSIS — E039 Hypothyroidism, unspecified: Secondary | ICD-10-CM | POA: Insufficient documentation

## 2015-12-08 DIAGNOSIS — E7800 Pure hypercholesterolemia, unspecified: Secondary | ICD-10-CM | POA: Insufficient documentation

## 2015-12-28 DIAGNOSIS — R490 Dysphonia: Secondary | ICD-10-CM | POA: Insufficient documentation

## 2016-01-19 DIAGNOSIS — K219 Gastro-esophageal reflux disease without esophagitis: Secondary | ICD-10-CM | POA: Insufficient documentation

## 2016-06-26 ENCOUNTER — Other Ambulatory Visit: Payer: Self-pay | Admitting: Physician Assistant

## 2016-06-26 DIAGNOSIS — R0989 Other specified symptoms and signs involving the circulatory and respiratory systems: Secondary | ICD-10-CM

## 2016-06-28 ENCOUNTER — Ambulatory Visit
Admission: RE | Admit: 2016-06-28 | Discharge: 2016-06-28 | Disposition: A | Discharge status: Home / Self Care | Payer: Medicare Other | Source: Ambulatory Visit | Attending: Physician Assistant | Admitting: Physician Assistant

## 2016-06-28 DIAGNOSIS — R0989 Other specified symptoms and signs involving the circulatory and respiratory systems: Secondary | ICD-10-CM

## 2016-11-14 ENCOUNTER — Other Ambulatory Visit: Payer: Self-pay | Admitting: Physician Assistant

## 2016-11-14 DIAGNOSIS — Z1231 Encounter for screening mammogram for malignant neoplasm of breast: Secondary | ICD-10-CM

## 2016-12-01 ENCOUNTER — Ambulatory Visit
Admission: RE | Admit: 2016-12-01 | Discharge: 2016-12-01 | Disposition: A | Discharge status: Home / Self Care | Payer: Medicare Other | Source: Ambulatory Visit | Attending: Physician Assistant | Admitting: Physician Assistant

## 2016-12-01 DIAGNOSIS — Z1231 Encounter for screening mammogram for malignant neoplasm of breast: Secondary | ICD-10-CM

## 2017-02-20 ENCOUNTER — Encounter: Payer: Self-pay | Admitting: Diagnostic Neuroimaging

## 2017-02-20 ENCOUNTER — Ambulatory Visit (INDEPENDENT_AMBULATORY_CARE_PROVIDER_SITE_OTHER): Payer: Medicare Other | Admitting: Diagnostic Neuroimaging

## 2017-02-20 VITALS — BP 151/86 | HR 77 | Ht 63.5 in | Wt 183.0 lb

## 2017-02-20 DIAGNOSIS — G3281 Cerebellar ataxia in diseases classified elsewhere: Secondary | ICD-10-CM | POA: Diagnosis not present

## 2017-02-20 DIAGNOSIS — G8929 Other chronic pain: Secondary | ICD-10-CM | POA: Diagnosis not present

## 2017-02-20 DIAGNOSIS — R490 Dysphonia: Secondary | ICD-10-CM

## 2017-02-20 DIAGNOSIS — R269 Unspecified abnormalities of gait and mobility: Secondary | ICD-10-CM | POA: Diagnosis not present

## 2017-02-20 DIAGNOSIS — M5441 Lumbago with sciatica, right side: Secondary | ICD-10-CM

## 2017-02-20 DIAGNOSIS — R413 Other amnesia: Secondary | ICD-10-CM

## 2017-02-20 DIAGNOSIS — M5442 Lumbago with sciatica, left side: Secondary | ICD-10-CM | POA: Diagnosis not present

## 2017-02-20 NOTE — Patient Instructions (Addendum)
-   check  MRI brain  - check labs (CK, aldolase, B12, A1c, TSH, AchR)  - consider MRI lumbar spine (since you are not likely a surgical candidate at this time, we will hold off for now)  - caution with driving and living alone  - PT evaluation for gait diff; consider cane or walker  - return in 1-2 weeks for memory testing

## 2017-02-20 NOTE — Progress Notes (Signed)
[facility]  PATIENT: Amber [name] DOB: [birth year]  REFERRING CLINICIAN: Leger HISTORY FROM: patient and daughter  REASON FOR VISIT: new consult    HISTORICAL  CHIEF COMPLAINT:  Chief Complaint  Patient presents with  . NP  Lefer  . Loss of balance    going on for about a year.  No falls.  Daughter in law with pt, [name]    HISTORY OF PRESENT ILLNESS:   76 year old right-handed female here for evaluation of balance and gait difficulty.  For past 5 years patient has had progressive intermittent balance and gait difficulty. She tends to lose her balance especially when she stands or turns. No headaches or dizziness or vertigo. Sometimes when she stands up and does activity she feels "tired" with some mild back discomfort and lower extremity pain. She is also noted some increasing numbness and pain in her feet and toes for past 1 year.  She does note some mild voice changes and having to clear her throat more often in the past 3 years.  Also with increasing short-term memory loss and confusion. She is having trouble with driving directions, getting mixed up with familiar locations.  Patient has had bilateral shoulder rotator cuff problems and surgeries in 2013 at 2015. She has had low back surgery in 2009 and 2011. She has had right knee surgery 2016.    REVIEW OF SYSTEMS: Full 14 system review of systems performed and negative with exception of: Incontinence memory loss confusion numbness restless legs.  ALLERGIES: Allergies  Allergen Reactions  . Codeine Nausea And Vomiting  . Darvocet [Propoxyphene N-Acetaminophen] Nausea And Vomiting  . Nickel Rash  . Sulfa Antibiotics Rash    Like a sunburn    HOME MEDICATIONS: Outpatient Medications Prior to Visit  Medication Sig Dispense Refill  . levothyroxine (SYNTHROID, LEVOTHROID) 50 MCG tablet Take 50 mcg by mouth daily before breakfast.    . losartan-hydrochlorothiazide (HYZAAR) 100-25 MG per tablet  Take 0.5 tablets by mouth every morning.     . simvastatin (ZOCOR) 20 MG tablet Take 20 mg by mouth every morning.     . diphenhydrAMINE (BENADRYL) 25 mg capsule Take 25 mg by mouth at bedtime.    . ferrous sulfate 325 (65 FE) MG tablet Take 1 tablet (325 mg total) by mouth 2 (two) times daily after a meal. (Patient not taking: Reported on 02/20/2017) 20 tablet 0  . HYDROcodone-acetaminophen (NORCO) 7.5-325 MG per tablet Take 1 tablet by mouth every 4 (four) hours as needed for moderate pain. (Patient not taking: Reported on 02/20/2017) 60 tablet 0  . methocarbamol (ROBAXIN) 500 MG tablet Take 1 tablet (500 mg total) by mouth every 6 (six) hours as needed for muscle spasms. (Patient not taking: Reported on 02/20/2017) 40 tablet 1  . ondansetron (ZOFRAN) 4 MG tablet Take 1 tablet (4 mg total) by mouth every 6 (six) hours as needed for nausea. (Patient not taking: Reported on 02/20/2017) 30 tablet 0  . rivaroxaban (XARELTO) 10 MG TABS tablet Take 1 tablet (10 mg total) by mouth daily with breakfast. (Patient not taking: Reported on 02/20/2017) 19 tablet 0   No facility-administered medications prior to visit.     PAST MEDICAL HISTORY: Past Medical History:  Diagnosis Date  . Arthritis   . BBB (bundle branch block)    Left  . DVT (deep venous thrombosis) (HCC)   . Family history of blood clots   . Hypertension   . Hypothyroidism   . Left bundle branch  block    patient reports history of for last several years   . Peripheral vascular disease (HCC)   . Varicose veins     PAST SURGICAL HISTORY: Past Surgical History:  Procedure Laterality Date  . ABDOMINAL HYSTERECTOMY  1979   partial done  . APPENDECTOMY  1979   when did hysterectomy  . BACK SURGERY     2009  . ENDOVENOUS ABLATION SAPHENOUS VEIN W/ LASER Right 09-15-2013   right greater saphenous vein by Gretta Began MD  . ENDOVENOUS ABLATION SAPHENOUS VEIN W/ LASER Left 10-16-2013   endovenous laser ablation left greater saphenous vein  by Gretta Began MD  . left open rotator cuff surgery   2010   . SHOULDER OPEN ROTATOR CUFF REPAIR  07/05/2011   Procedure: ROTATOR CUFF REPAIR SHOULDER OPEN;  Surgeon: Jacki Cones, MD;  Location: [facility];  Service: Orthopedics;  Laterality: Right;  . TOTAL KNEE ARTHROPLASTY Right 09/09/2014   Procedure: TOTAL RIGHT  KNEE ARTHROPLASTY;  Surgeon: Ranee Gosselin, MD;  Location: [facility];  Service: Orthopedics;  Laterality: Right;    FAMILY HISTORY: Family History  Problem Relation Age of Onset  . Heart disease Mother   . Cancer Mother   . Varicose Veins Mother   . Deep vein thrombosis Mother   . Diabetes Mother   . Hypertension Mother   . Heart disease Father   . Varicose Veins Sister   . Cancer Sister   . Diabetes Sister   . Heart disease Sister        before age 51  . Hypertension Sister   . Vision loss Sister   . Diabetes Brother   . Vision loss Brother   . Peripheral vascular disease Brother   . AAA (abdominal aortic aneurysm) Brother   . Diabetes Sister   . Heart disease Sister   . Hypertension Sister   . Vision loss Sister   . Diabetes Sister   . Vision loss Sister     SOCIAL HISTORY:  Social History   Social History  . Marital status: Widowed    Spouse name: N/A  . Number of children: N/A  . Years of education: N/A   Occupational History  . Not on file.   Social History Main Topics  . Smoking status: Former Smoker    Packs/day: 0.25    Years: 13.00    Types: Cigarettes    Quit date: 06/22/1964  . Smokeless tobacco: Never Used  . Alcohol use No  . Drug use: No  . Sexual activity: No   Other Topics Concern  . Not on file   Social History Narrative   Lives home alone.  Is a widow.  Education: HS grad.  2 children. Caffeine 2.5 cups daily.     PHYSICAL EXAM  GENERAL EXAM/CONSTITUTIONAL: Vitals:  Vitals:   02/20/17 1040  BP: (!) 151/86  Pulse: 77  Weight: 183 lb (83 kg)  Height: 5' 3.5" (1.613 m)     Body mass index is 31.91 kg/m.  No  exam data present  Patient is in no distress; well developed, nourished and groomed; neck is supple  CARDIOVASCULAR:  Examination of carotid arteries is normal; no carotid bruits  Regular rate and rhythm, no murmurs  Examination of peripheral vascular system by observation and palpation is normal  EYES:  Ophthalmoscopic exam of optic discs and posterior segments is normal; no papilledema or hemorrhages  MUSCULOSKELETAL:  Gait, strength, tone, movements noted in Neurologic exam below  NEUROLOGIC: MENTAL  STATUS:  No flowsheet data found.  awake, alert, oriented to person, place and time  recent and remote memory intact  normal attention and concentration  language fluent, comprehension intact, naming intact,   fund of knowledge appropriate  CRANIAL NERVE:   2nd - no papilledema on fundoscopic exam  2nd, 3rd, 4th, 6th - pupils equal and reactive to light, visual fields full to confrontation, extraocular muscles intact, no nystagmus  5th - facial sensation symmetric  7th - facial strength symmetric  8th - hearing intact  9th - palate elevates symmetrically, uvula midline  11th - shoulder shrug symmetric  12th - tongue protrusion midline  HOARSE VOICE  MOTOR:   normal bulk and tone  BILATERAL DELTOIDS 3; OTHERWISE 5  BLE 5  BRADYKINESIA IN BUE  SUBTLE HEAD TREMOR WITH RAM IN HANDS  SENSORY:   normal and symmetric to light touch  DECR VIB IN RIGHT FOOT WORSE THAN LEFT  COORDINATION:   finger-nose-finger, fine finger movements normal  REFLEXES:   deep tendon reflexes --> BUE TRACE; KNEES TRACE; ANKLES ABSENT  GAIT/STATION:   WIDE BASED GAIT; SLOW STEPS; SLIGHTLY UNSTEADY; CANNOT WALK TOES OR HEELS; ROMBERG POSITIVE    DIAGNOSTIC DATA (LABS, IMAGING, TESTING) - I reviewed patient records, labs, notes, testing and imaging myself where available.  Lab Results  Component Value Date   WBC 13.9 (H) 09/12/2014   HGB 10.2 (L) 09/12/2014    HCT 30.4 (L) 09/12/2014   MCV 92.4 09/12/2014   PLT 153 09/12/2014      Component Value Date/Time   NA 133 (L) 09/11/2014 0450   K 3.6 09/11/2014 0450   CL 93 (L) 09/11/2014 0450   CO2 33 (H) 09/11/2014 0450   GLUCOSE 113 (H) 09/11/2014 0450   BUN 8 09/11/2014 0450   CREATININE 0.58 09/11/2014 0450   CALCIUM 8.4 09/11/2014 0450   PROT 6.8 09/01/2014 1105   ALBUMIN 4.1 09/01/2014 1105   AST 24 09/01/2014 1105   ALT 19 09/01/2014 1105   ALKPHOS 68 09/01/2014 1105   BILITOT 0.7 09/01/2014 1105   GFRNONAA 87 (L) 09/11/2014 0450   GFRAA >90 09/11/2014 0450   No results found for: CHOL, HDL, LDLCALC, LDLDIRECT, TRIG, CHOLHDL No results found for: ZOXW9U No results found for: VITAMINB12 No results found for: TSH   10/24/11 CT head  - Moderate small vessel ischemic change.  No acute abnormality.  No enhancing lesion.  06/23/09 MRI lumbar spine  - Prior fusion L3-4 and L4-5 without spinal stenosis or foraminal narrowing at these levels. - L2-3 moderate broad-based protrusion with left posterior lateral caudally extending small fragment as discussed above. - L5-S1 bulge slightly greater to the left.     ASSESSMENT AND PLAN  76 y.o. year old female here with progressive gait and balance difficulty, with history of low back pain, lumbar spinal stenosis, with change in voice, muscle strength, fatigability, numbness and tingling in the feet and toes. Considerations would include lumbar spinal stenosis, neuromuscular disease, myopathy, myasthenia gravis, Parkinson's disease. Will proceed with further workup.   Dx:  1. Gait difficulty   2. Memory loss   3. Chronic bilateral low back pain with bilateral sciatica   4. Hoarse voice quality     PLAN: - MRI brain - check labs (CK, aldolase, B12, A1c, TSH, AchR) - consider MRI lumbar spine (but patient not likely a surgical candidate at this time, so will hold off for now) - caution with driving and living alone - PT evaluation for  gait diff; consider cane or walker - return for MMSE and braincheck testing  Orders Placed This Encounter  Procedures  . MR BRAIN WO CONTRAST  . CK  . Aldolase  . Hemoglobin A1c  . Vitamin B12  . TSH  . Myasthenia Gravis Full Panel  . Ambulatory referral to Physical Therapy   Return in about 3 months (around 05/22/2017).    Suanne Marker, MD 02/20/2017, 11:12 AM Certified in Neurology, Neurophysiology and Neuroimaging  [facility] [address] [place], [place] [address] [phone]

## 2017-02-24 LAB — HEMOGLOBIN A1C
Est. average glucose Bld gHb Est-mCnc: 117 mg/dL
Hgb A1c MFr Bld: 5.7 % — ABNORMAL HIGH (ref 4.8–5.6)

## 2017-02-24 LAB — MYASTHENIA GRAVIS FULL PANEL
AChR Binding Ab, Serum: <0.03 nmol/L (ref 0.00–0.24)
Acetylchol Block Ab: 19 % (ref 0–25)
Acetylcholine Modulat Ab: <12 % (ref 0–20)
Anti-striation Abs: NEGATIVE

## 2017-02-24 LAB — CK: Total CK: 48 U/L (ref 24–173)

## 2017-02-24 LAB — TSH: TSH: 1.26 u[IU]/mL (ref 0.450–4.500)

## 2017-02-24 LAB — ALDOLASE: Aldolase: 4.4 U/L (ref 3.3–10.3)

## 2017-02-24 LAB — VITAMIN B12: Vitamin B-12: 629 pg/mL (ref 232–1245)

## 2017-02-28 ENCOUNTER — Telehealth: Payer: Self-pay | Admitting: *Deleted

## 2017-02-28 NOTE — Telephone Encounter (Signed)
Called and spoke to [facility] and asked could they called patient and she is ready to schedule spoke to shawnee . Dirk Dress will call patient 03/01/2017 to schedule.  Called patient and spoke to her relayed to her Orlene Erm  She understood. Also gave her [facility] telephone number [phone].

## 2017-02-28 NOTE — Telephone Encounter (Signed)
Spoke to patient and informed her that her labs are unremarkable. Her MRI is next week. She stated she has not heard about the referral to PT. This RN advised her will have Annabelle Harman check into that referral. She verbalized understanding, appreciation.

## 2017-03-01 DIAGNOSIS — R413 Other amnesia: Secondary | ICD-10-CM | POA: Diagnosis not present

## 2017-03-06 ENCOUNTER — Ambulatory Visit
Admission: RE | Admit: 2017-03-06 | Discharge: 2017-03-06 | Disposition: A | Discharge status: Home / Self Care | Payer: Medicare Other | Source: Ambulatory Visit | Attending: Diagnostic Neuroimaging | Admitting: Diagnostic Neuroimaging

## 2017-03-06 DIAGNOSIS — G3281 Cerebellar ataxia in diseases classified elsewhere: Secondary | ICD-10-CM

## 2017-03-06 DIAGNOSIS — R269 Unspecified abnormalities of gait and mobility: Secondary | ICD-10-CM

## 2017-03-06 DIAGNOSIS — M5442 Lumbago with sciatica, left side: Secondary | ICD-10-CM

## 2017-03-06 DIAGNOSIS — M5441 Lumbago with sciatica, right side: Secondary | ICD-10-CM

## 2017-03-06 DIAGNOSIS — G8929 Other chronic pain: Secondary | ICD-10-CM

## 2017-03-06 DIAGNOSIS — R490 Dysphonia: Secondary | ICD-10-CM

## 2017-03-06 DIAGNOSIS — R413 Other amnesia: Secondary | ICD-10-CM | POA: Diagnosis not present

## 2017-03-08 ENCOUNTER — Telehealth: Payer: Self-pay | Admitting: *Deleted

## 2017-03-08 NOTE — Telephone Encounter (Signed)
-----   Message from Suanne Marker, MD sent at 03/08/2017 11:57 AM EDT ----- Unremarkable imaging results. Please call patient. Continue current plan. -VRP

## 2017-03-08 NOTE — Telephone Encounter (Signed)
Called and spoke with patient about unremarkable MRI results per VP,MD. Continue current plan. She verbalized understanding.

## 2017-03-23 ENCOUNTER — Ambulatory Visit: Payer: Medicare Other | Attending: Diagnostic Neuroimaging | Admitting: Physical Therapy

## 2017-03-23 DIAGNOSIS — R2681 Unsteadiness on feet: Secondary | ICD-10-CM

## 2017-03-23 DIAGNOSIS — R2689 Other abnormalities of gait and mobility: Secondary | ICD-10-CM

## 2017-03-23 DIAGNOSIS — M6281 Muscle weakness (generalized): Secondary | ICD-10-CM | POA: Diagnosis present

## 2017-03-23 NOTE — Therapy (Signed)
[facility] [address] [place], [place], [address] Phone: [phone]   Fax:  [phone]  Physical Therapy Evaluation  Patient Details  Name: Amber [name] MRN: [id number] Date of Birth: [birth year] Referring Provider: Joycelyn Schmid  Encounter Date: 03/23/2017      PT End of Session - 03/23/17 2100    Visit Number 1   Number of Visits 9   Date for PT Re-Evaluation 05/21/17   Authorization Type Medicare/[employer] every 10th visit; NO PTA (Tricare)   PT Start Time 1102   PT Stop Time 1145   PT Time Calculation (min) 43 min   Activity Tolerance Patient tolerated treatment well   Behavior During Therapy [facility] for tasks assessed/performed      Past Medical History:  Diagnosis Date  . Arthritis   . BBB (bundle branch block)    Left  . DVT (deep venous thrombosis) (HCC)   . Family history of blood clots   . Hypertension   . Hypothyroidism   . Left bundle branch block    patient reports history of for last several years   . Peripheral vascular disease (HCC)   . Varicose veins     Past Surgical History:  Procedure Laterality Date  . ABDOMINAL HYSTERECTOMY  1979   partial done  . APPENDECTOMY  1979   when did hysterectomy  . BACK SURGERY     2009  . ENDOVENOUS ABLATION SAPHENOUS VEIN W/ LASER Right 09-15-2013   right greater saphenous vein by Gretta Began MD  . ENDOVENOUS ABLATION SAPHENOUS VEIN W/ LASER Left 10-16-2013   endovenous laser ablation left greater saphenous vein by Gretta Began MD  . left open rotator cuff surgery   2010   . SHOULDER OPEN ROTATOR CUFF REPAIR  07/05/2011   Procedure: ROTATOR CUFF REPAIR SHOULDER OPEN;  Surgeon: Jacki Cones, MD;  Location: [facility];  Service: Orthopedics;  Laterality: Right;  . TOTAL KNEE ARTHROPLASTY Right 09/09/2014   Procedure: TOTAL RIGHT  KNEE ARTHROPLASTY;  Surgeon: Ranee Gosselin, MD;  Location: [facility];  Service: Orthopedics;  Laterality: Right;     There were no vitals filed for this visit.       Subjective Assessment - 03/23/17 1106    Subjective Pt reports wanting help with balance.  Sometimes she gets off balance, especially with transfers sit to stand or with changing directions.  No reported falls.  Does not use a cane.  Pt reports balance changes have been going on "for a while"   Pertinent History R TKR, 2 back surgeries, 2 shoulder surgeries, cataract surgeries.   Patient Stated Goals Pt's goals for therapy would be to be more stable and feel more sure of walking; to try to walk more and be more active.     Currently in Pain? No/denies            [facility] PT Assessment - 03/23/17 1109      Assessment   Medical Diagnosis gait instability   Referring Provider Joycelyn Schmid   Onset Date/Surgical Date 02/20/17     Precautions   Precautions Fall   Precaution Comments back pain limits long distance walking     Balance Screen   Has the patient fallen in the past 6 months No   Has the patient had a decrease in activity level because of a fear of falling?  No   Is the patient reluctant to leave their home because of a fear of falling?  No  Home Environment   Living Environment Private residence   Living Arrangements Alone   Available Help at Discharge Family   Type of Home House   Home Access Stairs to enter   Entrance Stairs-Number of Steps 2   Entrance Stairs-Rails Right;Left   Home Layout Two level;Able to live on main level with bedroom/bathroom   Home Equipment Cane - single point;Walker - 2 wheels  Has Life Alert   Additional Comments Leans against the wall to get dressed (3-4 years)     Prior Function   Level of Independence Independent   Leisure Admittedly, doesn't like to exercise much; active in church with bible studies and visiting friends     Observation/Other Assessments   Focus on Therapeutic Outcomes (FOTO)  NA     Posture/Postural Control   Posture/Postural Control Postural  limitations   Postural Limitations Forward head  Family reports increased flexed trunk/forward posture     ROM / Strength   AROM / PROM / Strength Strength     Strength   Overall Strength Comments Grossly tested bilateraly lower extremities:  bilateral hip flexors 4/5, R quads 3+/5, L quads 4/5, R and L hamstrings 4/5, R and L ankle dorsiflexion 4/5 (pt reports she feels L knee is weaker and she relies on R LE)     Transfers   Transfers Sit to Stand;Stand to Sit   Sit to Stand 6: Modified independent (Device/Increase time);With upper extremity assist;From chair/3-in-1  Tends to push up more through RUE until fully standing   Stand to Sit 6: Modified independent (Device/Increase time);With upper extremity assist;To chair/3-in-1     Ambulation/Gait   Ambulation/Gait Yes   Ambulation/Gait Assistance 5: Supervision   Ambulation Distance (Feet) 200 Feet   Assistive device None   Gait Pattern Step-through pattern;Wide base of support;Decreased step length - left   Ambulation Surface Indoor   Gait velocity 10.63 sec = 3.09 ft/sec     Standardized Balance Assessment   Standardized Balance Assessment Berg Balance Test;Dynamic Gait Index;Timed Up and Go Test     Berg Balance Test   Sit to Stand Able to stand  independently using hands   Standing Unsupported Able to stand 2 minutes with supervision   Sitting with Back Unsupported but Feet Supported on Floor or Stool Able to sit safely and securely 2 minutes   Stand to Sit Controls descent by using hands   Transfers Able to transfer safely, definite need of hands   Standing Unsupported with Eyes Closed Able to stand 3 seconds  8 seconds with posterior lean   Standing Ubsupported with Feet Together Able to place feet together independently and stand for 1 minute with supervision   From Standing, Reach Forward with Outstretched Arm Reaches forward but needs supervision   From Standing Position, Pick up Object from Floor Able to pick up shoe,  needs supervision   From Standing Position, Turn to Look Behind Over each Shoulder Turn sideways only but maintains balance   Turn 360 Degrees Able to turn 360 degrees safely but slowly   Standing Unsupported, Alternately Place Feet on Step/Stool Able to complete >2 steps/needs minimal assist   Standing Unsupported, One Foot in Front Able to take small step independently and hold 30 seconds   Standing on One Leg Unable to try or needs assist to prevent fall   Total Score 32   Berg comment: Scores <45/56 indicate increased fall risk.     Dynamic Gait Index   Level Surface Mild Impairment  Change in Gait Speed Mild Impairment   Gait with Horizontal Head Turns Mild Impairment   Gait with Vertical Head Turns Mild Impairment   Gait and Pivot Turn Normal   Step Over Obstacle Moderate Impairment   Step Around Obstacles Mild Impairment   Steps Moderate Impairment   Total Score 15   DGI comment: Scores <19/24 indicate increased fall risk.     Timed Up and Go Test   Normal TUG (seconds) 13.91   Cognitive TUG (seconds) 15   TUG Comments Scores >13.5-15 seconds indicate increased fall risk.            Objective measurements completed on examination: See above findings.                  PT Education - 03/23/17 2059    Education provided Yes   Education Details Discussed results of objective measures, fall risk per Sharlene Motts and DGI; discussed and trialed potential use of cane, but did not recommend without further training due to balance/safety concerns with sequencing cane   Person(s) Educated Patient;Child(ren)   Methods Explanation   Comprehension Verbalized understanding     Trial of gait x 120 ft using SPC, with therapist hand over hand cues for sequence, verbal cues for sequence of cane, but pt keeps cane too narrow and unable to maintain reciprocal sequence.  Will continue to work on gait training with cane, but did not recommend cane use at home at this time.         PT Long Term Goals - 03/23/17 2111      PT LONG TERM GOAL #1   Title Pt will be independent with HEP for improved strength, balance and gait.  TARGET 04/20/17   Time 5   Period Weeks   Status New   Target Date 04/20/17     PT LONG TERM GOAL #2   Title Pt will improve Berg Balance score to at least 40/56 for decreased fall risk.   Time 5   Period Weeks   Status New   Target Date 04/20/17     PT LONG TERM GOAL #3   Title Pt will improve DGI score to at least 19/24 for decreased fall risk.   Time 5   Period Weeks   Status New   Target Date 04/20/17     PT LONG TERM GOAL #4   Title Pt will ambulate at least 500 ft, indoors and outdoors, with single point cane, modified independently, for improved safety with gait.   Time 5   Period Weeks   Status New   Target Date 04/20/17     PT LONG TERM GOAL #5   Title Pt will verbalize understanding of fall prevention in the home environment.   Time 5   Period Weeks   Status New   Target Date 04/20/17                Plan - 03/23/17 2101    Clinical Impression Statement Pt is a 76 year old female who presents to OP with diagnosis of gait difficulty, with reports of worsening balance.  Pt has PMH of R TKR, shoulder surgeries, back surgeries.  She presents with decreased functional strength for transfers, decreased balance, decreased gait independence and safety, abnormal posture.  Pt is very active in her church community, and reports throughout eval that she is hesistant to trust LLE and tries to be safe with her walking.  She would benefit from skilled PT to address  the above stated deficits to decrease fall risk, improve functional mobility, and improve safe participation in community activities.   History and Personal Factors relevant to plan of care: gait instability, >3 comorbidities in PMH   Clinical Presentation Stable   Clinical Presentation due to: gait difficulty, at fall risk per DGI, Berg, and TUG scores   Clinical  Decision Making Low   Rehab Potential Good   PT Frequency 2x / week   PT Duration 4 weeks  plus eval; POC = 5 weeks   PT Treatment/Interventions ADLs/Self Care Home Management;Gait training;DME Instruction;Functional mobility training;Therapeutic activities;Therapeutic exercise;Balance training;Neuromuscular re-education;Patient/family education   PT Next Visit Plan Initiate HEP to address balance, functional lower extremity strengthening, gait training with cane   Consulted and Agree with Plan of Care Patient;Family member/caregiver   Family Member Consulted daughter-in-law      Patient will benefit from skilled therapeutic intervention in order to improve the following deficits and impairments:  Abnormal gait, Decreased balance, Decreased knowledge of use of DME, Decreased strength, Difficulty walking, Postural dysfunction  Visit Diagnosis: Other abnormalities of gait and mobility  Unsteadiness on feet  Muscle weakness (generalized)      G-Codes - 04-20-17 November 01, 2113    Functional Assessment Tool Used (Outpatient Only) gt vel 3.09 ft/sec, Berg 32/56, DGI 15/24, TUG 13.91 sec   Functional Limitation Mobility: Walking and moving around   Mobility: Walking and Moving Around Current Status [phone]) At least 20 percent but less than 40 percent impaired, limited or restricted   Mobility: Walking and Moving Around Goal Status [phone]) At least 1 percent but less than 20 percent impaired, limited or restricted       Problem List Patient Active Problem List   Diagnosis Date Noted  . History of total knee arthroplasty 09/09/2014  . Swelling of limb 09/02/2013  . Peripheral vascular disease, unspecified (HCC) 04/18/2013  . Chronic venous insufficiency 04/18/2013  . Varicose veins of lower extremities with other complications 04/18/2013  . Post-phlebitic syndrome 04/18/2013  . Rotator cuff rupture, complete 07/05/2011    Keiara Sneeringer W. 04/20/17, 9:16 PM  Gean Maidens., PT   [facility] [address] [place], [place], [address] Phone: [phone]   Fax:  [phone]  Name: GRADIE BUTRICK MRN: [id number] Date of Birth: 01-05-1941

## 2017-03-26 ENCOUNTER — Encounter: Payer: Self-pay | Admitting: Physical Therapy

## 2017-03-26 ENCOUNTER — Ambulatory Visit: Payer: Medicare Other | Admitting: Physical Therapy

## 2017-03-26 DIAGNOSIS — R2689 Other abnormalities of gait and mobility: Secondary | ICD-10-CM

## 2017-03-26 DIAGNOSIS — R2681 Unsteadiness on feet: Secondary | ICD-10-CM

## 2017-03-26 DIAGNOSIS — M6281 Muscle weakness (generalized): Secondary | ICD-10-CM

## 2017-03-26 NOTE — Patient Instructions (Signed)
  Standing in a corner with your back to the walls and a chair back in front of you for safety.  Feet Together, Head Motion - Eyes Closed    With eyes closed and feet together, hold for 30 seconds. Then with eyes closed move head slowly, up and down x 10 repetitions. Then left and right x 10 repetitions. And if doing well, add turning head on the diagonals x 10 each.  Do __1-2__ sessions per day.  Copyright  VHI. All rights reserved.    ABDUCTION: Standing (Active)    Stand, feet flat, facing the counter with both hands lightly on the counter for support. Lift right leg out to side. Hold for 3 seconds. Then switch and raise left leg for 3 seconds. Do not lean shoulders sideways. Keep toes pointed straight ahead. Complete __1 sets of __20_ repetitions. Perform __1_ sessions per day.  http://gtsc.exer.us/111   Copyright  VHI. All rights reserved.

## 2017-03-26 NOTE — Therapy (Signed)
[facility] [address] [place], [place], [address] Phone: [phone]   Fax:  [phone]  Physical Therapy Treatment  Patient Details  Name: Amber [name] MRN: [id number] Date of Birth: 1940/10/19 Referring Provider: Joycelyn Schmid  Encounter Date: 03/26/2017      PT End of Session - 03/26/17 2044    Visit Number 2   Number of Visits 9   Date for PT Re-Evaluation 05/21/17   Authorization Type Medicare/TriCAre-GCODE every 10th visit; NO PTA (Tricare)   PT Start Time 1402   PT Stop Time 1445   PT Time Calculation (min) 43 min   Activity Tolerance Patient tolerated treatment well   Behavior During Therapy [facility] for tasks assessed/performed      Past Medical History:  Diagnosis Date  . Arthritis   . BBB (bundle branch block)    Left  . DVT (deep venous thrombosis) (HCC)   . Family history of blood clots   . Hypertension   . Hypothyroidism   . Left bundle branch block    patient reports history of for last several years   . Peripheral vascular disease (HCC)   . Varicose veins     Past Surgical History:  Procedure Laterality Date  . ABDOMINAL HYSTERECTOMY  1979   partial done  . APPENDECTOMY  1979   when did hysterectomy  . BACK SURGERY     2009  . ENDOVENOUS ABLATION SAPHENOUS VEIN W/ LASER Right 09-15-2013   right greater saphenous vein by Gretta Began MD  . ENDOVENOUS ABLATION SAPHENOUS VEIN W/ LASER Left 10-16-2013   endovenous laser ablation left greater saphenous vein by Gretta Began MD  . left open rotator cuff surgery   2010   . SHOULDER OPEN ROTATOR CUFF REPAIR  07/05/2011   Procedure: ROTATOR CUFF REPAIR SHOULDER OPEN;  Surgeon: Jacki Cones, MD;  Location: [facility];  Service: Orthopedics;  Laterality: Right;  . TOTAL KNEE ARTHROPLASTY Right 09/09/2014   Procedure: TOTAL RIGHT  KNEE ARTHROPLASTY;  Surgeon: Ranee Gosselin, MD;  Location: [facility];  Service: Orthopedics;  Laterality: Right;     There were no vitals filed for this visit.      Subjective Assessment - 03/26/17 2031    Subjective Denies falling since last visit to clinic. Is not very interested in trying a cane for stabilty, but did agree to try   Pertinent History R TKR, 2 back surgeries, 2 shoulder surgeries, cataract surgeries.   Patient Stated Goals Pt's goals for therapy would be to be more stable and feel more sure of walking; to try to walk more and be more active.                           [facility] Adult PT Treatment/Exercise - 03/26/17 0001      Transfers   Transfers Sit to Stand;Stand to Sit   Sit to Stand 6: Modified independent (Device/Increase time)   Stand to Sit 6: Modified independent (Device/Increase time)     Ambulation/Gait   Ambulation/Gait Yes   Ambulation/Gait Assistance 5: Supervision   Ambulation/Gait Assistance Details vc for proper use of cane and attempting incr step length; slight drifitng off path with head turns with cane; able to hold head up and not look at her feet with cane   Ambulation Distance (Feet) 120 Feet  240   Assistive device Straight cane;None   Gait Pattern Step-through pattern;Wide base of support;Decreased step length -  left   Ambulation Surface Indoor     Balance   Balance Assessed Yes   Balance comment Assessed bil feet for protective sensation with monofilament and pt only able to feel on the top of her feet and directly under/in her arch. Educated re: importance to wear shoes at all times;     Exercises   Exercises Knee/Hip     Knee/Hip Exercises: Standing   Hip Abduction Stengthening;Both;2 sets;10 reps;Knee straight  2 second hold             Balance Exercises - 03/26/17 1527      Balance Exercises: Standing   Standing Eyes Opened Narrow base of support (BOS);Wide (BOA);Head turns;Foam/compliant surface   Standing Eyes Closed Narrow base of support (BOS);Wide (BOA);Head turns;Foam/compliant surface;Solid surface            PT Education - 03/26/17 2043    Education provided Yes   Education Details loss of protective sensation per testing; role of sensation, inner ear and vision in maintaining balance; role of SPC in providing incr sensation for balance   Person(s) Educated Patient   Methods Explanation;Demonstration;Verbal cues;Handout   Comprehension Verbalized understanding;Returned demonstration;Verbal cues required             PT Long Term Goals - 03/23/17 2111      PT LONG TERM GOAL #1   Title Pt will be independent with HEP for improved strength, balance and gait.  TARGET 04/20/17   Time 5   Period Weeks   Status New   Target Date 04/20/17     PT LONG TERM GOAL #2   Title Pt will improve Berg Balance score to at least 40/56 for decreased fall risk.   Time 5   Period Weeks   Status New   Target Date 04/20/17     PT LONG TERM GOAL #3   Title Pt will improve DGI score to at least 19/24 for decreased fall risk.   Time 5   Period Weeks   Status New   Target Date 04/20/17     PT LONG TERM GOAL #4   Title Pt will ambulate at least 500 ft, indoors and outdoors, with single point cane, modified independently, for improved safety with gait.   Time 5   Period Weeks   Status New   Target Date 04/20/17     PT LONG TERM GOAL #5   Title Pt will verbalize understanding of fall prevention in the home environment.   Time 5   Period Weeks   Status New   Target Date 04/20/17               Plan - 03/26/17 2046    Clinical Impression Statement Patient initially reluctant to attempt use of SPC, however with explanation/rationale for it's use, she was willing to try the cane. Remainder of session focused on education re: loss of protective sensation, systems that help regulate balance, safe use of SPC, and exercises for HEP. Patient very receptive to all education and anticipate she willcontinue to progress with further PT.   Rehab Potential Good   PT Frequency 2x / week   PT Duration  4 weeks   PT Treatment/Interventions ADLs/Self Care Home Management;Gait training;DME Instruction;Functional mobility training;Therapeutic activities;Therapeutic exercise;Balance training;Neuromuscular re-education;Patient/family education   PT Next Visit Plan check HEP given 10/29; add to HEP to address balance, functional lower extremity strengthening: gait training with cane   Consulted and Agree with Plan of Care Patient  Patient will benefit from skilled therapeutic intervention in order to improve the following deficits and impairments:  Abnormal gait, Decreased balance, Decreased knowledge of use of DME, Decreased strength, Difficulty walking, Postural dysfunction  Visit Diagnosis: Other abnormalities of gait and mobility  Unsteadiness on feet  Muscle weakness (generalized)     Problem List Patient Active Problem List   Diagnosis Date Noted  . History of total knee arthroplasty 09/09/2014  . Swelling of limb 09/02/2013  . Peripheral vascular disease, unspecified (HCC) 04/18/2013  . Chronic venous insufficiency 04/18/2013  . Varicose veins of lower extremities with other complications 04/18/2013  . Post-phlebitic syndrome 04/18/2013  . Rotator cuff rupture, complete 07/05/2011    Zena AmosLynn P Shaquisha Wynn, PT 03/26/2017, 8:51 PM  [facility] [address] [place], [place], [address] Phone: [phone]   Fax:  [phone]  Name: Amber [name] MRN: [id number] Date of Birth: 1941-01-18

## 2017-03-28 ENCOUNTER — Ambulatory Visit: Payer: Medicare Other | Admitting: Physical Therapy

## 2017-03-28 DIAGNOSIS — R2689 Other abnormalities of gait and mobility: Secondary | ICD-10-CM

## 2017-03-28 DIAGNOSIS — R2681 Unsteadiness on feet: Secondary | ICD-10-CM

## 2017-03-28 NOTE — Patient Instructions (Addendum)
EXTENSION: Standing (Active)    Stand, both feet flat. You can face the counter.  Kick right leg behind body gently, then return to the floor.  Alternate kicking each leg. Complete _1-2__ sets of _10__ repetitions. Perform _1-2__ sessions per day.  http://gtsc.exer.us/77   Copyright  VHI. All rights reserved.  "I love a Parade" Lift-MARCHING    Using a chair or counter if necessary, and march in place, alternating lifting legs. Repeat __10__ times. Do __1-2__ sessions per day.  http://gt2.exer.us/345   Copyright  VHI. All rights reserved.  Tandem Stance    Right foot in front of left, heel touching toe both feet "straight ahead". Hold to counter and look straight ahead. Balance in this position __10_ seconds. Do with left foot in front of right.  Repeat 3 times.  Copyright  VHI. All rights reserved.  SINGLE LIMB STANCE    Stance: single leg on floor. Stand at the counter, holding on if needed.  Raise leg. Hold _10__ seconds. Repeat with other leg. _3__ reps per set, __1-2_ sets per day.  Copyright  VHI. All rights reserved.

## 2017-03-28 NOTE — Therapy (Signed)
[facility] [address] [place], [place], [address] Phone: [phone]   Fax:  [phone]  Physical Therapy Treatment  Patient Details  Name: Amber [name] MRN: [id number] Date of Birth: [birth year] Referring Provider: Joycelyn SchmidPenumalli, Vikram  Encounter Date: 03/28/2017      PT End of Session - 03/28/17 0944    Visit Number 3   Number of Visits 9   Date for PT Re-Evaluation 05/21/17   Authorization Type Medicare/TriCAre-GCODE every 10th visit; NO PTA (Tricare)   PT Start Time 0804   PT Stop Time 0844   PT Time Calculation (min) 40 min   Activity Tolerance Patient tolerated treatment well   Behavior During Therapy [facility] for tasks assessed/performed      Past Medical History:  Diagnosis Date  . Arthritis   . BBB (bundle branch block)    Left  . DVT (deep venous thrombosis) (HCC)   . Family history of blood clots   . Hypertension   . Hypothyroidism   . Left bundle branch block    patient reports history of for last several years   . Peripheral vascular disease (HCC)   . Varicose veins     Past Surgical History:  Procedure Laterality Date  . ABDOMINAL HYSTERECTOMY  1979   partial done  . APPENDECTOMY  1979   when did hysterectomy  . BACK SURGERY     2009  . ENDOVENOUS ABLATION SAPHENOUS VEIN W/ LASER Right 09-15-2013   right greater saphenous vein by Gretta Beganodd Early MD  . ENDOVENOUS ABLATION SAPHENOUS VEIN W/ LASER Left 10-16-2013   endovenous laser ablation left greater saphenous vein by Gretta Beganodd Early MD  . left open rotator cuff surgery   2010   . SHOULDER OPEN ROTATOR CUFF REPAIR  07/05/2011   Procedure: ROTATOR CUFF REPAIR SHOULDER OPEN;  Surgeon: Jacki Conesonald A Gioffre, MD;  Location: [facility];  Service: Orthopedics;  Laterality: Right;  . TOTAL KNEE ARTHROPLASTY Right 09/09/2014   Procedure: TOTAL RIGHT  KNEE ARTHROPLASTY;  Surgeon: Ranee Gosselinonald Gioffre, MD;  Location: [facility];  Service: Orthopedics;  Laterality: Right;     There were no vitals filed for this visit.      Subjective Assessment - 03/28/17 0805    Subjective No changes, other than L hip is an aggravation.  A little sore when I sleep on it, but I wonder if L is a little lower since my knee surgery.   Pertinent History R TKR, 2 back surgeries, 2 shoulder surgeries, cataract surgeries.   Patient Stated Goals Pt's goals for therapy would be to be more stable and feel more sure of walking; to try to walk more and be more active.     Currently in Pain? Yes   Pain Score --  "aggravation"   Pain Location Hip   Pain Orientation Left   Pain Frequency Intermittent   Aggravating Factors  worse when sleeping on it wrong   Pain Relieving Factors rest, being off of it.                         [facility] Adult PT Treatment/Exercise - 03/28/17 0001      Ambulation/Gait   Ambulation/Gait Yes   Ambulation/Gait Assistance 5: Supervision   Ambulation/Gait Assistance Details wearing heel lift in L shoe   Ambulation Distance (Feet) 50 Feet  x 2, then 230 ft   Assistive device None   Gait Pattern Step-through pattern;Wide base of support;Decreased step length -  left   Ambulation Surface Indoor     Self-Care   Self-Care Other Self-Care Comments   Other Self-Care Comments  Per patient request, briefly assessed leg length difference.  In supine, after 3 reps of bridging and therapist assist to straighten lower extremities, it appears LLE is slightly shorter/higher than RLE.  However, upon measuring from greater trochanter to medial malleolus, LLE is 79 cm, RLE is 78 cm.  (Unable to measure from ASIS due to soft tissue-unable to accurately palpate)  Addressed pt's subjective report of LLE feeling shorter than RLE, with addition of heel lift to L shoe.     Advised patient to do skin check daily while wearing L heel lift in L shoe.  Also advised to discontinue use of L heel lift if it creates new or worsening pain in L hip.        Balance  Exercises - 03/28/17 0809      Balance Exercises: Standing   Standing Eyes Opened Wide (BOA);Foam/compliant surface;Head turns;Narrow base of support (BOS)  Head nods, 10 reps-each position   Standing Eyes Closed Narrow base of support (BOS);Wide (BOA);Head turns;Foam/compliant surface;Solid surface  10 reps, with head nods   Tandem Stance Eyes open;Intermittent upper extremity support;2 reps;10 secs   SLS Eyes open;Intermittent upper extremity support;2 reps;10 secs   Marching Limitations Marching in place x 10 reps   Other Standing Exercises Alternating hip kicks, side x 10 reps, back x 10 reps, then forward x 10 reps  Reviewed HEP from last visit-pt return demo understanding           PT Education - 03/28/17 0941    Education provided Yes   Education Details Additional balance exercises added to HEP-see instructions   Person(s) Educated Patient   Methods Explanation;Demonstration   Comprehension Verbalized understanding;Returned demonstration;Verbal cues required             PT Long Term Goals - 03/23/17 2111      PT LONG TERM GOAL #1   Title Pt will be independent with HEP for improved strength, balance and gait.  TARGET 04/20/17   Time 5   Period Weeks   Status New   Target Date 04/20/17     PT LONG TERM GOAL #2   Title Pt will improve Berg Balance score to at least 40/56 for decreased fall risk.   Time 5   Period Weeks   Status New   Target Date 04/20/17     PT LONG TERM GOAL #3   Title Pt will improve DGI score to at least 19/24 for decreased fall risk.   Time 5   Period Weeks   Status New   Target Date 04/20/17     PT LONG TERM GOAL #4   Title Pt will ambulate at least 500 ft, indoors and outdoors, with single point cane, modified independently, for improved safety with gait.   Time 5   Period Weeks   Status New   Target Date 04/20/17     PT LONG TERM GOAL #5   Title Pt will verbalize understanding of fall prevention in the home environment.    Time 5   Period Weeks   Status New   Target Date 04/20/17               Plan - 03/28/17 0945    Clinical Impression Statement Reviewed patient's HEP and added additional exercises for HEP.  Assessed leg length difference due to pt feeling LLE is shorter.  With  slight heel wedge added, patient feels more steady and stable with gait.  Pt willing to continue to try L heelwedge in shoe, with patient advised to check skin due to decreased sensation, and to discontinue use if pain is worsened.  Pt will continue to benefit from skilled PT to further address balance and gait.   Rehab Potential Good   PT Frequency 2x / week   PT Duration 4 weeks   PT Treatment/Interventions ADLs/Self Care Home Management;Gait training;DME Instruction;Functional mobility training;Therapeutic activities;Therapeutic exercise;Balance training;Neuromuscular re-education;Patient/family education   PT Next Visit Plan Review HEP to address balance, functional lower extremity strengthening: gait training with cane   Consulted and Agree with Plan of Care Patient      Patient will benefit from skilled therapeutic intervention in order to improve the following deficits and impairments:  Abnormal gait, Decreased balance, Decreased knowledge of use of DME, Decreased strength, Difficulty walking, Postural dysfunction  Visit Diagnosis: Unsteadiness on feet  Other abnormalities of gait and mobility     Problem List Patient Active Problem List   Diagnosis Date Noted  . History of total knee arthroplasty 09/09/2014  . Swelling of limb 09/02/2013  . Peripheral vascular disease, unspecified (HCC) 04/18/2013  . Chronic venous insufficiency 04/18/2013  . Varicose veins of lower extremities with other complications 04/18/2013  . Post-phlebitic syndrome 04/18/2013  . Rotator cuff rupture, complete 07/05/2011    Suhailah Kwan W. 03/28/2017, 9:52 AM  Gean Maidens., PT   [facility] [address] [place], [place], [address] Phone: [phone]   Fax:  [phone]  Name: ALIXANDRA ALFIERI MRN: [id number] Date of Birth: Nov 09, 1940

## 2017-04-02 ENCOUNTER — Ambulatory Visit: Payer: Medicare Other | Attending: Diagnostic Neuroimaging | Admitting: Physical Therapy

## 2017-04-02 ENCOUNTER — Encounter: Payer: Self-pay | Admitting: Physical Therapy

## 2017-04-02 DIAGNOSIS — M6281 Muscle weakness (generalized): Secondary | ICD-10-CM | POA: Diagnosis present

## 2017-04-02 DIAGNOSIS — R2689 Other abnormalities of gait and mobility: Secondary | ICD-10-CM | POA: Insufficient documentation

## 2017-04-02 DIAGNOSIS — R2681 Unsteadiness on feet: Secondary | ICD-10-CM | POA: Insufficient documentation

## 2017-04-02 NOTE — Patient Instructions (Addendum)
Weight Shift: Lateral (Righting / Equilibrium)    Feet shoulder width apart, slowly shift weight over right leg then shift weight over left leg Repeat __10__ times per session. Do _1-2___ sessions per day.  Copyright  VHI. All rights reserved.  Weight Shift: Diagonal    Stand with your feet apart and slightly staggered one ahead of the other.  Slowly shift weight forward over right leg. Then shift backward over opposite leg. Repeat ____ times per session. Do __10__ reps per day.    Copyright  VHI. All rights reserved.

## 2017-04-03 NOTE — Therapy (Signed)
[facility] [address] [place], [place], [address] Phone: [phone]   Fax:  [phone]  Physical Therapy Treatment  Patient Details  Name: Amber [name] MRN: [id number] Date of Birth: 31-Jul-1940 Referring Provider: Joycelyn Schmid   Encounter Date: 04/02/2017  PT End of Session - 04/03/17 1704    Visit Number  4    Number of Visits  9    Date for PT Re-Evaluation  05/21/17    Authorization Type  Medicare/TriCAre-GCODE every 10th visit; NO PTA (Tricare)    PT Start Time  1234    PT Stop Time  1315    PT Time Calculation (min)  41 min    Activity Tolerance  Patient tolerated treatment well    Behavior During Therapy  [facility] for tasks assessed/performed       Past Medical History:  Diagnosis Date  . Arthritis   . BBB (bundle branch block)    Left  . DVT (deep venous thrombosis) (HCC)   . Family history of blood clots   . Hypertension   . Hypothyroidism   . Left bundle branch block    patient reports history of for last several years   . Peripheral vascular disease (HCC)   . Varicose veins     Past Surgical History:  Procedure Laterality Date  . ABDOMINAL HYSTERECTOMY  1979   partial done  . APPENDECTOMY  1979   when did hysterectomy  . BACK SURGERY     2009  . ENDOVENOUS ABLATION SAPHENOUS VEIN W/ LASER Right 09-15-2013   right greater saphenous vein by Gretta Began MD  . ENDOVENOUS ABLATION SAPHENOUS VEIN W/ LASER Left 10-16-2013   endovenous laser ablation left greater saphenous vein by Gretta Began MD  . left open rotator cuff surgery   2010     There were no vitals filed for this visit.  Subjective Assessment - 04/02/17 1236    Subjective  Found another insert that fits and feels a little better in my shoe.  I feel a little more even when I walk.    Pertinent History  R TKR, 2 back surgeries, 2 shoulder surgeries, cataract surgeries.    Patient Stated Goals  Pt's goals for therapy would be to  be more stable and feel more sure of walking; to try to walk more and be more active.      Currently in Pain?  No/denies                           Balance Exercises - 04/02/17 1252      Balance Exercises: Standing   Standing Eyes Opened  Wide (BOA);Foam/compliant surface;Head turns;Narrow base of support (BOS);5 reps Head nods   Head nods   Standing Eyes Closed  Narrow base of support (BOS);Wide (BOA);Head turns;Foam/compliant surface;Solid surface;5 reps Head nods   Head nods   Tandem Stance  Eyes open;Intermittent upper extremity support;2 reps;10 secs    SLS  Eyes open;Intermittent upper extremity support;2 reps;10 secs    Rockerboard  Anterior/posterior;Head turns;EO Head nods, EC x 10 seconds; hip/ankle strategy work   [employer], EC x 10 seconds; hip/ankle strategy work   Human resources officer in place x 10 reps    Other Standing Exercises  Alternating hip kicks, side x 10 reps, back x 10 reps, then forward x 10 reps.  Review of HEP given last visit, with pt return demo understanding.  Single limb stance  activities:  alternating step taps -single, double, triple step taps to balance disks, then stepping over obstacles, x 10 reps each side-intermittent UE support.  Lateral weightshifting x 10 reps with widened BOS, then stagger stance forward/back weightshifting x 5 reps, in response to pt's reported narrow BOS stance (taking extra steps to catch balance at home).          PT Education - 04/03/17 1703    Education provided  Yes    Education Details  HEP additions    Person(s) Educated  Patient    Methods  Explanation;Demonstration;Handout    Comprehension  Verbalized understanding;Returned demonstration          PT Long Term Goals - 03/23/17 2111      PT LONG TERM GOAL #1   Title  Pt will be independent with HEP for improved strength, balance and gait.  TARGET 04/20/17    Time  5    Period  Weeks    Status  New    Target Date  04/20/17       PT LONG TERM GOAL #2   Title  Pt will improve Berg Balance score to at least 40/56 for decreased fall risk.    Time  5    Period  Weeks    Status  New    Target Date  04/20/17      PT LONG TERM GOAL #3   Title  Pt will improve DGI score to at least 19/24 for decreased fall risk.    Time  5    Period  Weeks    Status  New    Target Date  04/20/17      PT LONG TERM GOAL #4   Title  Pt will ambulate at least 500 ft, indoors and outdoors, with single point cane, modified independently, for improved safety with gait.    Time  5    Period  Weeks    Status  New    Target Date  04/20/17      PT LONG TERM GOAL #5   Title  Pt will verbalize understanding of fall prevention in the home environment.    Time  5    Period  Weeks    Status  New    Target Date  04/20/17            Plan - 04/03/17 1704    Clinical Impression Statement  Reviewed pt's HEP and added exercises to help address pt's narrowed BOS.  Pt overall reports feeling better balance with addition of a slight shoe lift she purchased.  [place]taking breaks as needed, but fatigued in legs a bit.  Pt  does note turns are difficult, with plans to address turns next visit.    Rehab Potential  Good    PT Frequency  2x / week    PT Duration  4 weeks    PT Treatment/Interventions  ADLs/Self Care Home Management;Gait training;DME Instruction;Functional mobility training;Therapeutic activities;Therapeutic exercise;Balance training;Neuromuscular re-education;Patient/family education    PT Next Visit Plan  Review HEP; work on turns with gait; ask about gait with cane (pt may not need as she feels/appears more balanced with lift in shoe); funcitonal lower extremity strengthening    Consulted and Agree with Plan of Care  Patient       Patient will benefit from skilled therapeutic intervention in order to improve the following deficits and impairments:  Abnormal gait, Decreased balance, Decreased  knowledge of use of  DME, Decreased strength, Difficulty walking, Postural dysfunction  Visit Diagnosis: Unsteadiness on feet     Problem List Patient Active Problem List   Diagnosis Date Noted  . History of total knee arthroplasty 09/09/2014  . Swelling of limb 09/02/2013  . Peripheral vascular disease, unspecified (HCC) 04/18/2013  . Chronic venous insufficiency 04/18/2013  . Varicose veins of lower extremities with other complications 04/18/2013  . Post-phlebitic syndrome 04/18/2013  . Rotator cuff rupture, complete 07/05/2011    Corsica Franson W. 04/03/2017, 5:08 PM  Gean Maidens., PT    [facility] [address] [place], [place], [address] Phone: [phone]   Fax:  [phone]  Name: Amber [name] MRN: [id number] Date of Birth: 1941/04/26

## 2017-04-06 ENCOUNTER — Ambulatory Visit: Payer: Medicare Other | Admitting: Physical Therapy

## 2017-04-09 ENCOUNTER — Ambulatory Visit: Payer: Medicare Other | Admitting: Physical Therapy

## 2017-04-09 ENCOUNTER — Encounter: Payer: Self-pay | Admitting: Physical Therapy

## 2017-04-09 DIAGNOSIS — R2681 Unsteadiness on feet: Secondary | ICD-10-CM

## 2017-04-09 DIAGNOSIS — R2689 Other abnormalities of gait and mobility: Secondary | ICD-10-CM

## 2017-04-09 NOTE — Therapy (Signed)
[facility] [address] [place], [place], [address] Phone: [phone]   Fax:  [phone]  Physical Therapy Treatment  Patient Details  Name: Amber [name] MRN: [id number] Date of Birth: 26-Dec-1940 Referring Provider: Joycelyn SchmidPenumalli, Vikram   Encounter Date: 04/09/2017  PT End of Session - 04/09/17 1330    Visit Number  5    Number of Visits  9    Date for PT Re-Evaluation  05/21/17    Authorization Type  Medicare/Tri[employer]-GCODE every 10th visit; NO PTA (Tricare)    PT Start Time  1234    PT Stop Time  1313    PT Time Calculation (min)  39 min    Activity Tolerance  Patient tolerated treatment well    Behavior During Therapy  [facility] for tasks assessed/performed       Past Medical History:  Diagnosis Date  . Arthritis   . BBB (bundle branch block)    Left  . DVT (deep venous thrombosis) (HCC)   . Family history of blood clots   . Hypertension   . Hypothyroidism   . Left bundle branch block    patient reports history of for last several years   . Peripheral vascular disease (HCC)   . Varicose veins     Past Surgical History:  Procedure Laterality Date  . ABDOMINAL HYSTERECTOMY  1979   partial done  . APPENDECTOMY  1979   when did hysterectomy  . BACK SURGERY     2009  . ENDOVENOUS ABLATION SAPHENOUS VEIN W/ LASER Right 09-15-2013   right greater saphenous vein by Gretta Beganodd Early MD  . ENDOVENOUS ABLATION SAPHENOUS VEIN W/ LASER Left 10-16-2013   endovenous laser ablation left greater saphenous vein by Gretta Beganodd Early MD  . left open rotator cuff surgery   2010     There were no vitals filed for this visit.  Subjective Assessment - 04/09/17 1238    Subjective  Had stomach problems on Friday, so I had to cancel.  Feeling better today.    Pertinent History  R TKR, 2 back surgeries, 2 shoulder surgeries, cataract surgeries.    Patient Stated Goals  Pt's goals for therapy would be to be more stable and feel more sure  of walking; to try to walk more and be more active.      Currently in Pain?  No/denies                      [facility] - 04/09/17 0001      High Level Balance   High Level Balance Activities  Turns;Weight-shifting turns    High Level Balance Comments  Reviewed balance exercises from HEP last visit:  pt return demo lateral weightshifting at counter x 10 reps, needs clarification for stagger stance forward/back weightshifting.  Worked on various turning techniques-quarter turns with leading leg to turn that direciton (to avoid crossing legs), wide U-turns,and weightshifting turns.  Practiced simulating household context-getting up from her chair and turning, kitchen at refrigerator turns      Therapeutic Activites    Therapeutic Activities  Other Therapeutic Activities    Other Therapeutic Activities  Simulated picking up and carrying things, turning and changing directions as she would do at home in kitchen.        With turns-focus on avoiding braiding, crossing feet.  Carrying lotion bottles and cones, practicing multiple reps at counter.    Balance Exercises - 04/09/17 1321  Balance Exercises: Standing   Rockerboard  Anterior/posterior;Lateral;EO;Head turns Head nods, 5 reps; ankle/hip strategy work    Sidestepping  2 reps along counter-cues to avoid crossing feet    Turning  Right;Left    Marching Limitations  Marching in place x 10 reps standing on blue foam beam    Other Standing Exercises  Standing on foam beam:  alternating hip kicks x 10, alternating forward step taps x 10 reps; forward step over beam x 5, side step over beam x 5 reps each leg, intermittent UE support        PT Education - 04/09/17 1329    Education provided  Yes    Education Details  Turning strategies at home    Person(s) Educated  Patient    Methods  Explanation;Demonstration;Verbal cues    Comprehension  Verbalized understanding;Returned demonstration           PT Long Term Goals - 03/23/17 2111      PT LONG TERM GOAL #1   Title  Pt will be independent with HEP for improved strength, balance and gait.  TARGET 04/20/17    Time  5    Period  Weeks    Status  New    Target Date  04/20/17      PT LONG TERM GOAL #2   Title  Pt will improve Berg Balance score to at least 40/56 for decreased fall risk.    Time  5    Period  Weeks    Status  New    Target Date  04/20/17      PT LONG TERM GOAL #3   Title  Pt will improve DGI score to at least 19/24 for decreased fall risk.    Time  5    Period  Weeks    Status  New    Target Date  04/20/17      PT LONG TERM GOAL #4   Title  Pt will ambulate at least 500 ft, indoors and outdoors, with single point cane, modified independently, for improved safety with gait.    Time  5    Period  Weeks    Status  New    Target Date  04/20/17      PT LONG TERM GOAL #5   Title  Pt will verbalize understanding of fall prevention in the home environment.    Time  5    Period  Weeks    Status  New    Target Date  04/20/17            Plan - 04/09/17 1330    Clinical Impression Statement  Pt needs additional review/practice of stagger stance weightshifting balance exercises given as HEP last visit.  Pt is trying to cross her legs, and admits/demonstrates that sometimes she crosses legs over when carrying objects in kitchen area.  Practiced sidestepping, turning, forward walking and direction changes, to work on improved safety with turns and changes of direction.  Pt will continue to benefit from skilled PT to address balance and gait.    Rehab Potential  Good    PT Frequency  2x / week    PT Duration  4 weeks    PT Treatment/Interventions  ADLs/Self Care Home Management;Gait training;DME Instruction;Functional mobility training;Therapeutic activities;Therapeutic exercise;Balance training;Neuromuscular re-education;Patient/family education    PT Next Visit Plan  Continue to work on turns and  gait; functional lower extremity strengthening, compliant surface balance work    Financial planner with Plan of  Care  Patient       Patient will benefit from skilled therapeutic intervention in order to improve the following deficits and impairments:  Abnormal gait, Decreased balance, Decreased knowledge of use of DME, Decreased strength, Difficulty walking, Postural dysfunction  Visit Diagnosis: Unsteadiness on feet  Other abnormalities of gait and mobility     Problem List Patient Active Problem List   Diagnosis Date Noted  . History of total knee arthroplasty 09/09/2014  . Swelling of limb 09/02/2013  . Peripheral vascular disease, unspecified (HCC) 04/18/2013  . Chronic venous insufficiency 04/18/2013  . Varicose veins of lower extremities with other complications 04/18/2013  . Post-phlebitic syndrome 04/18/2013  . Rotator cuff rupture, complete 07/05/2011    Sundai Probert W. 04/09/2017, 1:33 PM  Gean MaidensMARRIOTT,Kaithlyn Teagle W., PT  [facility] [address] [place], [place], [address] Phone: [phone]   Fax:  [phone]  Name: Amber Petrinatsy S Ghee MRN: [id number] Date of Birth: 03-29-1941

## 2017-04-13 ENCOUNTER — Encounter: Payer: Self-pay | Admitting: Physical Therapy

## 2017-04-13 ENCOUNTER — Ambulatory Visit: Payer: Medicare Other | Admitting: Physical Therapy

## 2017-04-13 DIAGNOSIS — R2681 Unsteadiness on feet: Secondary | ICD-10-CM | POA: Diagnosis not present

## 2017-04-13 DIAGNOSIS — R2689 Other abnormalities of gait and mobility: Secondary | ICD-10-CM

## 2017-04-13 DIAGNOSIS — M6281 Muscle weakness (generalized): Secondary | ICD-10-CM

## 2017-04-13 NOTE — Therapy (Signed)
[facility] [address] [place], [place], [address] Phone: [phone]   Fax:  [phone]  Physical Therapy Treatment  Patient Details  Name: Amber [name] MRN: [id number] Date of Birth: 1940/11/09 Referring Provider: Joycelyn Schmid   Encounter Date: 04/13/2017  PT End of Session - 04/13/17 1156    Visit Number  6    Number of Visits  9    Date for PT Re-Evaluation  05/21/17    Authorization Type  Medicare/Tri[employer]re-GCODE every 10th visit; NO PTA (Tricare)    PT Start Time  1103    PT Stop Time  1143    PT Time Calculation (min)  40 min    Activity Tolerance  Patient tolerated treatment well    Behavior During Therapy  [facility] for tasks assessed/performed       Past Medical History:  Diagnosis Date  . Arthritis   . BBB (bundle branch block)    Left  . DVT (deep venous thrombosis) (HCC)   . Family history of blood clots   . Hypertension   . Hypothyroidism   . Left bundle branch block    patient reports history of for last several years   . Peripheral vascular disease (HCC)   . Varicose veins     Past Surgical History:  Procedure Laterality Date  . ABDOMINAL HYSTERECTOMY  1979   partial done  . APPENDECTOMY  1979   when did hysterectomy  . BACK SURGERY     2009  . ENDOVENOUS ABLATION SAPHENOUS VEIN W/ LASER Right 09-15-2013   right greater saphenous vein by Gretta Began MD  . ENDOVENOUS ABLATION SAPHENOUS VEIN W/ LASER Left 10-16-2013   endovenous laser ablation left greater saphenous vein by Gretta Began MD  . left open rotator cuff surgery   2010   . ROTATOR CUFF REPAIR SHOULDER OPEN Right 07/05/2011   Performed by Jacki Cones, MD at [facility] ORS  . TOTAL RIGHT  KNEE ARTHROPLASTY Right 09/09/2014   Performed by Ranee Gosselin, MD at [facility]    There were no vitals filed for this visit.  Subjective Assessment - 04/13/17 1106    Subjective  Trying not to get out this week, due to the weather.       Pertinent History  R TKR, 2 back surgeries, 2 shoulder surgeries, cataract surgeries.    Patient Stated Goals  Pt's goals for therapy would be to be more stable and feel more sure of walking; to try to walk more and be more active.      Currently in Pain?  No/denies                      [facility] Adult PT Treatment/Exercise - 04/13/17 1108      Transfers   Transfers  Sit to Stand;Stand to Sit    Sit to Stand  6: Modified independent (Device/Increase time);Without upper extremity assist;From bed;From chair/3-in-1    Stand to Sit  6: Modified independent (Device/Increase time);Without upper extremity assist;To bed;To chair/3-in-1    Number of Reps  -- 5 reps from 20", 18", 16" soft surfaces    Transfer Cueing  Cues for scooting, forward lean, increased momentum (to decrease UE use), for improved functional strength for improved ease/efficiency of transfers.      Ambulation/Gait   Ambulation/Gait  Yes    Ambulation/Gait Assistance  6: Modified independent (Device/Increase time)    Ambulation Distance (Feet)  120 Feet x 2, 230 ft  Assistive device  None    Gait Pattern  Step-through pattern;Wide base of support;Decreased step length - left    Ambulation Surface  Level;Indoor    Gait Comments  Verbally reviewed turning practice from last visit, and pt verbalizes understanding and putting tips into practice at home.          Balance Exercises - 04/13/17 1149      Balance Exercises: Standing   Rockerboard  Anterior/posterior;Lateral;EO;Head turns;5 reps;EC Head nods, hip/ankle strategy work; arm swing    Sidestepping  Foam/compliant support;3 reps    Marching Limitations  Marching in place on balance beam x 10 reps    Other Standing Exercises  Standing on foam balance beam:  alternating forward kicks x 10, alternating forward step taps x 10, alternating back step taps x 10, forward>back step taps x 10 reps, side step and weightshift on beam x 10 reps each side.  Standing on  ramp incline/decline:  EO feet apart with head turns x 10, head nods x 10 then marching in place x 10      With work on rockerboard:  Intermittent UE support and supervision with EO, then intermittent UE support and min guard assist with EC.       PT Long Term Goals - 03/23/17 2111      PT LONG TERM GOAL #1   Title  Pt will be independent with HEP for improved strength, balance and gait.  TARGET 04/20/17    Time  5    Period  Weeks    Status  New    Target Date  04/20/17      PT LONG TERM GOAL #2   Title  Pt will improve Berg Balance score to at least 40/56 for decreased fall risk.    Time  5    Period  Weeks    Status  New    Target Date  04/20/17      PT LONG TERM GOAL #3   Title  Pt will improve DGI score to at least 19/24 for decreased fall risk.    Time  5    Period  Weeks    Status  New    Target Date  04/20/17      PT LONG TERM GOAL #4   Title  Pt will ambulate at least 500 ft, indoors and outdoors, with single point cane, modified independently, for improved safety with gait.    Time  5    Period  Weeks    Status  New    Target Date  04/20/17      PT LONG TERM GOAL #5   Title  Pt will verbalize understanding of fall prevention in the home environment.    Time  5    Period  Weeks    Status  New    Target Date  04/20/17            Plan - 04/13/17 1156    Clinical Impression Statement  Skilled PT session focused on functional lower extremity strengthening with transfer training from varied surfaces, which patient is able to improve ease of transfers and decreased UE support with cues for technique.  Also continued to work on varied compliant surface activities for balance work.  Pt will continue to benefit from skilled PT to address balance and gait.    Rehab Potential  Good    PT Frequency  2x / week    PT Duration  4 weeks    PT Treatment/Interventions  ADLs/Self Care Home Management;Gait training;DME Instruction;Functional mobility  training;Therapeutic activities;Therapeutic exercise;Balance training;Neuromuscular re-education;Patient/family education    PT Next Visit Plan  Continue compliant surface balance and gait; plan to begin assessing goals and likely ready for d/c next week    Consulted and Agree with Plan of Care  Patient       Patient will benefit from skilled therapeutic intervention in order to improve the following deficits and impairments:  Abnormal gait, Decreased balance, Decreased knowledge of use of DME, Decreased strength, Difficulty walking, Postural dysfunction  Visit Diagnosis: Muscle weakness (generalized)  Unsteadiness on feet  Other abnormalities of gait and mobility     Problem List Patient Active Problem List   Diagnosis Date Noted  . History of total knee arthroplasty 09/09/2014  . Swelling of limb 09/02/2013  . Peripheral vascular disease, unspecified (HCC) 04/18/2013  . Chronic venous insufficiency 04/18/2013  . Varicose veins of lower extremities with other complications 04/18/2013  . Post-phlebitic syndrome 04/18/2013  . Rotator cuff rupture, complete 07/05/2011    Santresa Levett W. 04/13/2017, 12:02 PM  Gean MaidensMARRIOTT,Caedyn Tassinari W., PT    [facility] [address] [place], [place], [address] Phone: [phone]   Fax:  [phone]  Name: Amber [name] MRN: [id number] Date of Birth: 12-Mar-1941

## 2017-04-24 ENCOUNTER — Ambulatory Visit: Payer: Medicare Other | Admitting: Physical Therapy

## 2017-04-24 ENCOUNTER — Encounter: Payer: Self-pay | Admitting: Physical Therapy

## 2017-04-24 DIAGNOSIS — R2681 Unsteadiness on feet: Secondary | ICD-10-CM

## 2017-04-24 DIAGNOSIS — R2689 Other abnormalities of gait and mobility: Secondary | ICD-10-CM

## 2017-04-24 NOTE — Therapy (Signed)
[place] [address] [place], [place], [address] Phone: [phone]   Fax:  [phone]  Physical Therapy Treatment and Discharge Summary  Patient Details  Name: Amber [name] MRN: [id number] Date of Birth: 18-Nov-1940 Referring Provider: Andrey Spearman   Encounter Date: 04/24/2017  PT End of Session - 04/24/17 2048    Visit Number  7    Number of Visits  9    Date for PT Re-Evaluation  05/21/17    Authorization Type  Medicare/TriCAre-GCODE every 10th visit; NO PTA (Tricare)    PT Start Time  1400    PT Stop Time  1444    PT Time Calculation (min)  44 min    Activity Tolerance  Patient tolerated treatment well    Behavior During Therapy  [facility] for tasks assessed/performed       Past Medical History:  Diagnosis Date  . Arthritis   . BBB (bundle branch block)    Left  . DVT (deep venous thrombosis) ([place])   . Family history of blood clots   . Hypertension   . Hypothyroidism   . Left bundle branch block    patient reports history of for last several years   . Peripheral vascular disease ([place])   . Varicose veins     Past Surgical History:  Procedure Laterality Date  . ABDOMINAL HYSTERECTOMY  1979   partial done  . APPENDECTOMY  1979   when did hysterectomy  . BACK SURGERY     2009  . ENDOVENOUS ABLATION SAPHENOUS VEIN W/ LASER Right 09-15-2013   right greater saphenous vein by Curt Jews MD  . ENDOVENOUS ABLATION SAPHENOUS VEIN W/ LASER Left 10-16-2013   endovenous laser ablation left greater saphenous vein by Curt Jews MD  . left open rotator cuff surgery   2010   . SHOULDER OPEN ROTATOR CUFF REPAIR  07/05/2011   Procedure: ROTATOR CUFF REPAIR SHOULDER OPEN;  Surgeon: Tobi Bastos, MD;  Location: [facility] ORS;  Service: Orthopedics;  Laterality: Right;  . TOTAL KNEE ARTHROPLASTY Right 09/09/2014   Procedure: TOTAL RIGHT  KNEE ARTHROPLASTY;  Surgeon: Latanya Maudlin, MD;  Location: [facility] ORS;  Service:  Orthopedics;  Laterality: Right;    There were no vitals filed for this visit.  Subjective Assessment - 04/24/17 1358    Subjective  Went out-of-town for Thanksgiving and did not feel unsteady at any time--even in unfamiliar territory.    Pertinent History  R TKR, 2 back surgeries, 2 shoulder surgeries, cataract surgeries.    Patient Stated Goals  Pt's goals for therapy would be to be more stable and feel more sure of walking; to try to walk more and be more active.      Currently in Pain?  No/denies         [facility] PT Assessment - 04/24/17 0001      Dynamic Gait Index   Level Surface  Normal    Change in Gait Speed  Normal    Gait with Horizontal Head Turns  Normal    Gait with Vertical Head Turns  Mild Impairment    Gait and Pivot Turn  Normal    Step Over Obstacle  Mild Impairment    Step Around Obstacles  Normal    Steps  Moderate Impairment    Total Score  20                  [facility] Adult PT Treatment/Exercise - 04/24/17 1416  Transfers   Transfers  Sit to Stand;Stand to Sit    Sit to Stand  Without upper extremity assist;From bed;From chair/3-in-1;7: Independent    Stand to Sit  7: Independent;Without upper extremity assist      Ambulation/Gait   Ambulation/Gait  Yes    Ambulation/Gait Assistance  7: Independent    Ambulation/Gait Assistance Details  discussed benefits of using SPC in unfamiliar environments due to neuropathy and pt verbalized good understanding and rationale    Ambulation Distance (Feet)  500 Feet x 2    Assistive device  None    Gait Pattern  Step-through pattern;Wide base of support    Ambulation Surface  Indoor      Standardized Balance Assessment   Standardized Balance Assessment  Berg Balance Test      Berg Balance Test   Sit to Stand  Able to stand without using hands and stabilize independently    Standing Unsupported  Able to stand safely 2 minutes    Sitting with Back Unsupported but Feet Supported on Floor or Stool  Able  to sit safely and securely 2 minutes    Stand to Sit  Sits safely with minimal use of hands    Transfers  Able to transfer safely, minor use of hands    Standing Unsupported with Eyes Closed  Able to stand 10 seconds safely    Standing Ubsupported with Feet Together  Able to place feet together independently and stand 1 minute safely    From Standing, Reach Forward with Outstretched Arm  Can reach confidently >25 cm (10")    From Standing Position, Pick up Object from Floor  Able to pick up shoe safely and easily    From Standing Position, Turn to Look Behind Over each Shoulder  Looks behind from both sides and weight shifts well    Turn 360 Degrees  Able to turn 360 degrees safely in 4 seconds or less    Standing Unsupported, Alternately Place Feet on Step/Stool  Able to stand independently and safely and complete 8 steps in 20 seconds    Standing Unsupported, One Foot in [place] to place foot tandem independently and hold 30 seconds    Standing on One Leg  Able to lift leg independently and hold > 10 seconds    Total Score  56             PT Education - 04/24/17 1448    Education provided  Yes    Education Details  provided information on fall prevention and home environment setup; results of assessments for LTGs    Person(s) Educated  Patient    Methods  Explanation;Handout    Comprehension  Verbalized understanding          PT Long Term Goals - 04/24/17 2050      PT LONG TERM GOAL #1   Title  Pt will be independent with HEP for improved strength, balance and gait.  TARGET 04/20/17    Time  5    Period  Weeks    Status  Achieved      PT LONG TERM GOAL #2   Title  Pt will improve Berg Balance score to at least 40/56 for decreased fall risk.    Baseline  04/24/17  56/56    Time  5    Period  Weeks    Status  Achieved      PT LONG TERM GOAL #3   Title  Pt will improve DGI  score to at least 19/24 for decreased fall risk.    Baseline  05-15-17  20/24    Time  5     Period  Weeks    Status  Achieved      PT LONG TERM GOAL #4   Title  Pt will ambulate at least 500 ft, indoors and outdoors, with single point cane, modified independently, for improved safety with gait.    Baseline  2017/05/15 Unable to assess outdoors; indoors met    Time  5    Period  Weeks    Status  Partially Met      PT LONG TERM GOAL #5   Title  Pt will verbalize understanding of fall prevention in the home environment.    Baseline  2017-05-15 provided handout and educated on several key points (especially need for lighting at night)    Time  5    Period  Weeks    Status  Partially Met            Plan - 15-May-2017 2054    Clinical Impression Statement  Session focused on assessing progress towards LTGs with patient meeting 3 of 5 goals and remaining 2 goals partially met (progress towards goal accomplished and/orone part of goal met while another part not met). Patient exceeded predicted improvement. Patient reports a good friend has asked her to join a gym [employer]) with her. Eduated patient on pro's and con's of working out in a gym and importance of adding cardiovascular exercise to her routine to assist with stroke prevention. Patient encouraged to follow-through on continuing HEP. Patient is ready for discharge this date.     Rehab Potential  Good    PT Frequency  2x / week    PT Duration  4 weeks    PT Treatment/Interventions  ADLs/Self Care Home Management;Gait training;DME Instruction;Functional mobility training;Therapeutic activities;Therapeutic exercise;Balance training;Neuromuscular re-education;Patient/family education    Consulted and Agree with Plan of Care  Patient       Patient will benefit from skilled therapeutic intervention in order to improve the following deficits and impairments:  Abnormal gait, Decreased balance, Decreased knowledge of use of DME, Decreased strength, Difficulty walking, Postural dysfunction  Visit Diagnosis: Unsteadiness on  feet  Other abnormalities of gait and mobility   G-Codes - 2017/05/15 2099/08/23    Functional Assessment Tool Used (Outpatient Only)   Berg 56/56, DGI 20/24, TUG 11.06   sec    Functional Limitation  Mobility: Walking and moving around         Mobility: Walking and Moving Around Goal Status [phone])  At least 1 percent but less than 20 percent impaired, limited or restricted    Mobility: Walking and Moving Around Discharge Status [phone])  At least 1 percent but less than 20 percent impaired, limited or restricted       Problem List Patient Active Problem List   Diagnosis Date Noted  . History of total knee arthroplasty 09/09/2014  . Swelling of limb 09/02/2013  . Peripheral vascular disease, unspecified ([place]) 04/18/2013  . Chronic venous insufficiency 04/18/2013  . Varicose veins of lower extremities with other complications 09/81/1914  . Post-phlebitic syndrome 04/18/2013  . Rotator cuff rupture, complete 07/05/2011   PHYSICAL THERAPY DISCHARGE SUMMARY  Visits from Start of Care: 7  Current functional level related to goals / functional outcomes: Ambulating without assistive device   Remaining deficits: Hip weakness   Education / Equipment: HEP  Plan: Patient agrees to discharge.  Patient goals were  partially met. Patient is being discharged due to being pleased with the current functional level.  ?????       Rexanne Mano, PT 04/24/2017, 9:09 PM  [place] [address] [place], [place], [address] Phone: [phone]   Fax:  [phone]  Name: Amber [name] MRN: [id number] Date of Birth: 05/15/1941

## 2017-04-24 NOTE — Patient Instructions (Addendum)
Fall Prevention in the Home Falls can cause injuries and can affect people from all age groups. There are many simple things that you can do to make your home safe and to help prevent falls. What can I do on the outside of my home?  Regularly repair the edges of walkways and driveways and fix any cracks.  Remove high doorway thresholds.  Trim any shrubbery on the main path into your home.  Use bright outdoor lighting.  Clear walkways of debris and clutter, including tools and rocks.  Regularly check that handrails are securely fastened and in good repair. Both sides of any steps should have handrails.  Install guardrails along the edges of any raised decks or porches.  Have leaves, snow, and ice cleared regularly.  Use sand or salt on walkways during winter months.  In the garage, clean up any spills right away, including grease or oil spills. What can I do in the bathroom?  Use night lights.  Install grab bars by the toilet and in the tub and shower. Do not use towel bars as grab bars.  Use non-skid mats or decals on the floor of the tub or shower.  If you need to sit down while you are in the shower, use a plastic, non-slip stool.  Keep the floor dry. Immediately clean up any water that spills on the floor.  Remove soap buildup in the tub or shower on a regular basis.  Attach bath mats securely with double-sided non-slip rug tape.  Remove throw rugs and other tripping hazards from the floor. What can I do in the bedroom?  Use night lights.  Make sure that a bedside light is easy to reach.  Do not use oversized bedding that drapes onto the floor.  Have a firm chair that has side arms to use for getting dressed.  Remove throw rugs and other tripping hazards from the floor. What can I do in the kitchen?  Clean up any spills right away.  Avoid walking on wet floors.  Place frequently used items in easy-to-reach places.  If you need to reach for something above  you, use a sturdy step stool that has a grab bar.  Keep electrical cables out of the way.  Do not use floor polish or wax that makes floors slippery. If you have to use wax, make sure that it is non-skid floor wax.  Remove throw rugs and other tripping hazards from the floor. What can I do in the stairways?  Do not leave any items on the stairs.  Make sure that there are handrails on both sides of the stairs. Fix handrails that are broken or loose. Make sure that handrails are as long as the stairways.  Check any carpeting to make sure that it is firmly attached to the stairs. Fix any carpet that is loose or worn.  Avoid having throw rugs at the top or bottom of stairways, or secure the rugs with carpet tape to prevent them from moving.  Make sure that you have a light switch at the top of the stairs and the bottom of the stairs. If you do not have them, have them installed. What are some other fall prevention tips?  Wear closed-toe shoes that fit well and support your feet. Wear shoes that have rubber soles or low heels.  When you use a stepladder, make sure that it is completely opened and that the sides are firmly locked. Have someone hold the ladder while you are using   it. Do not climb a closed stepladder.  Add color or contrast paint or tape to grab bars and handrails in your home. Place contrasting color strips on the first and last steps.  Use mobility aids as needed, such as canes, walkers, scooters, and crutches.  Turn on lights if it is dark. Replace any light bulbs that burn out.  Set up furniture so that there are clear paths. Keep the furniture in the same spot.  Fix any uneven floor surfaces.  Choose a carpet design that does not hide the edge of steps of a stairway.  Be aware of any and all pets.  Review your medicines with your healthcare provider. Some medicines can cause dizziness or changes in blood pressure, which increase your risk of falling. Talk with  your health care provider about other ways that you can decrease your risk of falls. This may include working with a physical therapist or trainer to improve your strength, balance, and endurance. This information is not intended to replace advice given to you by your health care provider. Make sure you discuss any questions you have with your health care provider. Document Released: 05/05/2002 Document Revised: 10/12/2015 Document Reviewed: 06/19/2014 Elsevier Interactive Patient Education  2017 Elsevier Inc.  

## 2017-04-27 ENCOUNTER — Ambulatory Visit: Payer: Medicare Other | Admitting: Physical Therapy

## 2017-05-15 ENCOUNTER — Ambulatory Visit (INDEPENDENT_AMBULATORY_CARE_PROVIDER_SITE_OTHER): Payer: Medicare Other | Admitting: Diagnostic Neuroimaging

## 2017-05-15 ENCOUNTER — Encounter: Payer: Self-pay | Admitting: Diagnostic Neuroimaging

## 2017-05-15 VITALS — BP 158/85 | HR 86 | Wt 185.0 lb

## 2017-05-15 DIAGNOSIS — G8929 Other chronic pain: Secondary | ICD-10-CM

## 2017-05-15 DIAGNOSIS — R413 Other amnesia: Secondary | ICD-10-CM | POA: Diagnosis not present

## 2017-05-15 DIAGNOSIS — M5442 Lumbago with sciatica, left side: Secondary | ICD-10-CM

## 2017-05-15 DIAGNOSIS — M5441 Lumbago with sciatica, right side: Secondary | ICD-10-CM

## 2017-05-15 NOTE — Progress Notes (Signed)
[facility]  PATIENT: Amber [name] DOB: [birth year]  REFERRING CLINICIAN: Leger HISTORY FROM: patient and daughter  REASON FOR VISIT: follow up    HISTORICAL  CHIEF COMPLAINT:  Chief Complaint  Patient presents with  . Gait Problem    rm 7, dgtr-in-law- [name], "completed therapy x 5 wks, was helpful"  . Memory Loss    MMSE  24  "no new concerns, I have Life Alert"  . Follow-up    3 month    HISTORY OF PRESENT ILLNESS:   UPDATE (05/15/17, VRP): Since last visit, doing well. PT exercises have helped back pain and balance. Now planning to go to [employer] in the future. No alleviating or aggravating factors. Memory issues mild and stable. Overall doing well.    PRIOR HPI (02/20/17):  : [age] year old right-handed female here for evaluation of balance and gait difficulty. For past 5 years patient has had progressive intermittent balance and gait difficulty. She tends to lose her balance especially when she stands or turns. No headaches or dizziness or vertigo. Sometimes when she stands up and does activity she feels "tired" with some mild back discomfort and lower extremity pain. She is also noted some increasing numbness and pain in her feet and toes for past 1 year. She does note some mild voice changes and having to clear her throat more often in the past 3 years. Also with increasing short-term memory loss and confusion. She is having trouble with driving directions, getting mixed up with familiar locations. Patient has had bilateral shoulder rotator cuff problems and surgeries in 2013 at 2015. She has had low back surgery in 2009 and 2011. She has had right knee surgery 2016.    REVIEW OF SYSTEMS: Full 14 system review of systems performed and negative with exception of: only as per HPI.    ALLERGIES: Allergies  Allergen Reactions  . Codeine Nausea And Vomiting  . Darvocet [Propoxyphene N-Acetaminophen] Nausea And Vomiting  . Vicodin  [Hydrocodone-Acetaminophen]   . Nickel Rash  . Sulfa Antibiotics Rash    Like a sunburn    HOME MEDICATIONS: Outpatient Medications Prior to Visit  Medication Sig Dispense Refill  . acetaminophen (TYLENOL) 500 MG tablet Take 500 mg by mouth every 8 (eight) hours as needed.    Marland Kitchen. aspirin 81 MG chewable tablet Chew 81 mg by mouth.    . B Complex Vitamins (VITAMIN-B COMPLEX) TABS Take by mouth.    . Cholecalciferol (VITAMIN D3) 1000 units CAPS Take by mouth.    . levothyroxine (SYNTHROID, LEVOTHROID) 50 MCG tablet Take 50 mcg by mouth daily before breakfast.    . losartan-hydrochlorothiazide (HYZAAR) 100-25 MG per tablet Take 0.5 tablets by mouth every morning.     . Multiple Vitamins-Minerals (MULTIVITAMIN ADULTS PO) Take by mouth.    . Multiple Vitamins-Minerals (PRESERVISION AREDS PO) Take by mouth.    . simvastatin (ZOCOR) 20 MG tablet Take 20 mg by mouth every morning.      No facility-administered medications prior to visit.     PAST MEDICAL HISTORY: Past Medical History:  Diagnosis Date  . Arthritis   . BBB (bundle branch block)    Left  . DVT (deep venous thrombosis) (HCC)   . Family history of blood clots   . Hypertension   . Hypothyroidism   . Left bundle branch block    patient reports history of for last several years   . Peripheral vascular disease (HCC)   . Varicose veins  PAST SURGICAL HISTORY: Past Surgical History:  Procedure Laterality Date  . ABDOMINAL HYSTERECTOMY  1979   partial done  . APPENDECTOMY  1979   when did hysterectomy  . BACK SURGERY     2009  . ENDOVENOUS ABLATION SAPHENOUS VEIN W/ LASER Right 09-15-2013   right greater saphenous vein by Gretta Beganodd Early MD  . ENDOVENOUS ABLATION SAPHENOUS VEIN W/ LASER Left 10-16-2013   endovenous laser ablation left greater saphenous vein by Gretta Beganodd Early MD  . left open rotator cuff surgery   2010   . SHOULDER OPEN ROTATOR CUFF REPAIR  07/05/2011   Procedure: ROTATOR CUFF REPAIR SHOULDER OPEN;  Surgeon:  Jacki Conesonald A Gioffre, MD;  Location: [facility];  Service: Orthopedics;  Laterality: Right;  . TOTAL KNEE ARTHROPLASTY Right 09/09/2014   Procedure: TOTAL RIGHT  KNEE ARTHROPLASTY;  Surgeon: Ranee Gosselinonald Gioffre, MD;  Location: [facility];  Service: Orthopedics;  Laterality: Right;    FAMILY HISTORY: Family History  Problem Relation Age of Onset  . Heart disease Mother   . Cancer Mother   . Varicose Veins Mother   . Deep vein thrombosis Mother   . Diabetes Mother   . Hypertension Mother   . Heart disease Father   . Varicose Veins Sister   . Cancer Sister   . Diabetes Sister   . Heart disease Sister        before age 78  . Hypertension Sister   . Vision loss Sister   . Diabetes Brother   . Vision loss Brother   . Peripheral vascular disease Brother   . AAA (abdominal aortic aneurysm) Brother   . Diabetes Sister   . Heart disease Sister   . Hypertension Sister   . Vision loss Sister   . Diabetes Sister   . Vision loss Sister     SOCIAL HISTORY:  Social History   Socioeconomic History  . Marital status: Widowed    Spouse name: Not on file  . Number of children: Not on file  . Years of education: Not on file  . Highest education level: Not on file  Social Needs  . Financial resource strain: Not on file  . Food insecurity - worry: Not on file  . Food insecurity - inability: Not on file  . Transportation needs - medical: Not on file  . Transportation needs - non-medical: Not on file  Occupational History  . Not on file  Tobacco Use  . Smoking status: Former Smoker    Packs/day: 0.25    Years: 13.00    Pack years: 3.25    Types: Cigarettes    Last attempt to quit: 06/22/1964    Years since quitting: 52.9  . Smokeless tobacco: Never Used  Substance and Sexual Activity  . Alcohol use: No  . Drug use: No  . Sexual activity: No    Birth control/protection: Post-menopausal  Other Topics Concern  . Not on file  Social History Narrative   Lives home alone.  Is a widow.  Education:  HS grad.  2 children. Caffeine 2.5 cups daily.     PHYSICAL EXAM  GENERAL EXAM/CONSTITUTIONAL: Vitals:  Vitals:   05/15/17 0849  BP: (!) 158/85  Pulse: 86  Weight: 185 lb (83.9 kg)   Body mass index is 32.26 kg/m. No exam data present  Patient is in no distress; well developed, nourished and groomed; neck is supple  CARDIOVASCULAR:  Examination of carotid arteries is normal; no carotid bruits  Regular rate and rhythm, no murmurs  Examination of peripheral vascular system by observation and palpation is normal  EYES:  Ophthalmoscopic exam of optic discs and posterior segments is normal; no papilledema or hemorrhages  MUSCULOSKELETAL:  Gait, strength, tone, movements noted in Neurologic exam below  NEUROLOGIC: MENTAL STATUS:  MMSE - Mini Mental State Exam 05/15/2017  Orientation to time 3  Orientation to Place 5  Registration 3  Attention/ Calculation 1  Recall 3  Language- name 2 objects 2  Language- repeat 1  Language- follow 3 step command 3  Language- read & follow direction 1  Write a sentence 1  Copy design 1  Total score 24    awake, alert, oriented to person, place and time  recent and remote memory intact  normal attention and concentration  language fluent, comprehension intact, naming intact,   fund of knowledge appropriate  CRANIAL NERVE:   2nd - no papilledema on fundoscopic exam  2nd, 3rd, 4th, 6th - pupils equal and reactive to light, visual fields full to confrontation, extraocular muscles intact, no nystagmus  5th - facial sensation symmetric  7th - facial strength symmetric  8th - hearing intact  9th - palate elevates symmetrically, uvula midline  11th - shoulder shrug symmetric  12th - tongue protrusion midline  HOARSE VOICE  MOTOR:   normal bulk and tone  BILATERAL DELTOIDS 3; OTHERWISE 5  BLE 5  MILD BRADYKINESIA IN BUE  SENSORY:   normal and symmetric to light touch  DECR VIB IN LEFT FOOT THAN  RIGHT  COORDINATION:   finger-nose-finger, fine finger movements normal  REFLEXES:   deep tendon reflexes --> BUE TRACE; KNEES TRACE; ANKLES ABSENT  GAIT/STATION:   NARROW BASED GAIT; SMOOTH STRIDE; GOOD ARM SWING    DIAGNOSTIC DATA (LABS, IMAGING, TESTING) - I reviewed patient records, labs, notes, testing and imaging myself where available.  Lab Results  Component Value Date   WBC 13.9 (H) 09/12/2014   HGB 10.2 (L) 09/12/2014   HCT 30.4 (L) 09/12/2014   MCV 92.4 09/12/2014   PLT 153 09/12/2014      Component Value Date/Time   NA 133 (L) 09/11/2014 0450   K 3.6 09/11/2014 0450   CL 93 (L) 09/11/2014 0450   CO2 33 (H) 09/11/2014 0450   GLUCOSE 113 (H) 09/11/2014 0450   BUN 8 09/11/2014 0450   CREATININE 0.58 09/11/2014 0450   CALCIUM 8.4 09/11/2014 0450   PROT 6.8 09/01/2014 1105   ALBUMIN 4.1 09/01/2014 1105   AST 24 09/01/2014 1105   ALT 19 09/01/2014 1105   ALKPHOS 68 09/01/2014 1105   BILITOT 0.7 09/01/2014 1105   GFRNONAA 87 (L) 09/11/2014 0450   GFRAA >90 09/11/2014 0450   No results found for: CHOL, HDL, LDLCALC, LDLDIRECT, TRIG, CHOLHDL Lab Results  Component Value Date   HGBA1C 5.7 (H) 02/20/2017   Lab Results  Component Value Date   VITAMINB12 629 02/20/2017   Lab Results  Component Value Date   TSH 1.260 02/20/2017   02/20/17 Labs - AchR ab < 0.03, CK 48, aldolase 4.4  10/24/11 CT head  - Moderate small vessel ischemic change.  No acute abnormality.  No enhancing lesion.  06/23/09 MRI lumbar spine  - Prior fusion L3-4 and L4-5 without spinal stenosis or foraminal narrowing at these levels. - L2-3 moderate broad-based protrusion with left posterior lateral caudally extending small fragment as discussed above. - L5-S1 bulge slightly greater to the left.  03/06/17 MRI brain  1. Mild scattered periventricular, subcortical and juxtacortical chronic small  vessel ischemic disease. 2. No acute findings.  03/01/17 Neurocognitive [place]  testing - overall in average range for cognitive functioning - slightly lower avg in cog processing and exec function - avg in visual function and imm recall - high avg range for delayed recall    ASSESSMENT AND PLAN  76 y.o. year old female here with progressive gait and balance difficulty, with history of low back pain, lumbar spinal stenosis, with change in voice, muscle strength, fatigability, numbness and tingling in the feet and toes. Overall doing better with gait and back pain. Memory issues mild and stable.    Dx:  1. Chronic bilateral low back pain with bilateral sciatica   2. Memory loss      PLAN:  I spent 25 minutes of face to face time with patient. Greater than 50% of time was spent in counseling and coordination of care with patient. In summary we discussed:   LOW BACK PAIN / GAIT DIFF - consider MRI lumbar spine (but patient not likely a surgical candidate at this time, so will hold off for now) - caution with driving and living alone - continue PT exercises at home  MEMORY LOSS (MCI vs age related changes) - monitor sxs; brain healthy activities reviewed   Return if symptoms worsen or fail to improve, for return to PCP.    Suanne Marker, MD 05/15/2017, 9:03 AM Certified in Neurology, Neurophysiology and Neuroimaging  [facility] [address] [place], [place] [address] [phone]

## 2017-11-26 ENCOUNTER — Other Ambulatory Visit: Payer: Self-pay | Admitting: Physician Assistant

## 2017-11-26 DIAGNOSIS — R0989 Other specified symptoms and signs involving the circulatory and respiratory systems: Secondary | ICD-10-CM

## 2017-12-04 ENCOUNTER — Other Ambulatory Visit: Payer: Medicare Other

## 2017-12-10 ENCOUNTER — Ambulatory Visit
Admission: RE | Admit: 2017-12-10 | Discharge: 2017-12-10 | Disposition: A | Discharge status: Home / Self Care | Payer: Medicare Other | Source: Ambulatory Visit | Attending: Physician Assistant | Admitting: Physician Assistant

## 2017-12-10 DIAGNOSIS — R0989 Other specified symptoms and signs involving the circulatory and respiratory systems: Secondary | ICD-10-CM

## 2018-04-04 ENCOUNTER — Other Ambulatory Visit: Payer: Self-pay

## 2018-04-04 ENCOUNTER — Encounter (HOSPITAL_COMMUNITY): Payer: Self-pay

## 2018-04-04 ENCOUNTER — Emergency Department (HOSPITAL_COMMUNITY): Payer: Medicare Other

## 2018-04-04 ENCOUNTER — Emergency Department (HOSPITAL_COMMUNITY)
Admission: EM | Admit: 2018-04-04 | Discharge: 2018-04-04 | Disposition: A | Discharge status: Home / Self Care | Payer: Medicare Other | Attending: Emergency Medicine | Admitting: Emergency Medicine

## 2018-04-04 DIAGNOSIS — Z79899 Other long term (current) drug therapy: Secondary | ICD-10-CM | POA: Diagnosis not present

## 2018-04-04 DIAGNOSIS — Z87891 Personal history of nicotine dependence: Secondary | ICD-10-CM | POA: Insufficient documentation

## 2018-04-04 DIAGNOSIS — Z7982 Long term (current) use of aspirin: Secondary | ICD-10-CM | POA: Diagnosis not present

## 2018-04-04 DIAGNOSIS — E039 Hypothyroidism, unspecified: Secondary | ICD-10-CM | POA: Diagnosis not present

## 2018-04-04 DIAGNOSIS — I10 Essential (primary) hypertension: Secondary | ICD-10-CM | POA: Insufficient documentation

## 2018-04-04 DIAGNOSIS — Z96651 Presence of right artificial knee joint: Secondary | ICD-10-CM | POA: Diagnosis not present

## 2018-04-04 DIAGNOSIS — H81399 Other peripheral vertigo, unspecified ear: Secondary | ICD-10-CM | POA: Diagnosis not present

## 2018-04-04 DIAGNOSIS — R42 Dizziness and giddiness: Secondary | ICD-10-CM | POA: Diagnosis present

## 2018-04-04 LAB — I-STAT CHEM 8, ED
BUN: 12 mg/dL (ref 8–23)
Calcium, Ion: 1.16 mmol/L (ref 1.15–1.40)
Chloride: 95 mmol/L — ABNORMAL LOW (ref 98–111)
Creatinine, Ser: 0.6 mg/dL (ref 0.44–1.00)
Glucose, Bld: 99 mg/dL (ref 70–99)
HCT: 40 % (ref 36.0–46.0)
Hemoglobin: 13.6 g/dL (ref 12.0–15.0)
Potassium: 4.1 mmol/L (ref 3.5–5.1)
Sodium: 133 mmol/L — ABNORMAL LOW (ref 135–145)
TCO2: 29 mmol/L (ref 22–32)

## 2018-04-04 LAB — CBC WITH DIFFERENTIAL/PLATELET
Abs Immature Granulocytes: 0.04 10*3/uL (ref 0.00–0.07)
Basophils Absolute: 0 10*3/uL (ref 0.0–0.1)
Basophils Relative: 0 %
Eosinophils Absolute: 0.1 10*3/uL (ref 0.0–0.5)
Eosinophils Relative: 1 %
HCT: 40.3 % (ref 36.0–46.0)
Hemoglobin: 13.5 g/dL (ref 12.0–15.0)
Immature Granulocytes: 1 %
Lymphocytes Relative: 23 %
Lymphs Abs: 1.9 10*3/uL (ref 0.7–4.0)
MCH: 31.9 pg (ref 26.0–34.0)
MCHC: 33.5 g/dL (ref 30.0–36.0)
MCV: 95.3 fL (ref 80.0–100.0)
Monocytes Absolute: 0.5 10*3/uL (ref 0.1–1.0)
Monocytes Relative: 6 %
Neutro Abs: 5.7 10*3/uL (ref 1.7–7.7)
Neutrophils Relative %: 69 %
Platelets: 152 10*3/uL (ref 150–400)
RBC: 4.23 MIL/uL (ref 3.87–5.11)
RDW: 13.1 % (ref 11.5–15.5)
WBC: 8.3 10*3/uL (ref 4.0–10.5)
nRBC: 0 % (ref 0.0–0.2)

## 2018-04-04 MED ORDER — LORAZEPAM 2 MG/ML IJ SOLN
0.2500 mg | Freq: Once | INTRAMUSCULAR | Status: AC
  Administered 2018-04-04: 0.25 mg via INTRAVENOUS
  Filled 2018-04-04: qty 1

## 2018-04-04 MED ORDER — LORAZEPAM 0.5 MG PO TABS: 0.5000 mg | ORAL_TABLET | Freq: Three times a day (TID) | ORAL | 0 refills | Status: DC | PRN

## 2018-04-04 NOTE — Discharge Instructions (Signed)
Use the Epley maneuver to help with symptoms that appear to be coming from your left side.

## 2018-04-04 NOTE — ED Provider Notes (Signed)
[facility]-EMERGENCY DEPT Provider Note   CSN: [id number] Arrival date & time: 04/04/18  0746     History   Chief Complaint Chief Complaint  Patient presents with  . Dizziness    HPI Amber [name] is a 77 y.o. female.  HPI Patient presents with dizziness/vertigo.  Had an episode around 7 years ago but started around 2 weeks ago this time.  Comes and goes but will get severe at times.  Will get worse if she moves her head.  Has had Antivert for PCP without persistent relief.  Some nausea without vomiting.  Occasionally get a sharp headache.  No other localizing numbness or weakness but has had some difficulty walking.  No chest pain.  No trouble breathing. Past Medical History:  Diagnosis Date  . Arthritis   . BBB (bundle branch block)    Left  . DVT (deep venous thrombosis) (HCC)   . Family history of blood clots   . Hypertension   . Hypothyroidism   . Left bundle branch block    patient reports history of for last several years   . Peripheral vascular disease (HCC)   . Varicose veins     Patient Active Problem List   Diagnosis Date Noted  . History of total knee arthroplasty 09/09/2014  . Swelling of limb 09/02/2013  . Peripheral vascular disease, unspecified (HCC) 04/18/2013  . Chronic venous insufficiency 04/18/2013  . Varicose veins of lower extremities with other complications 04/18/2013  . Post-phlebitic syndrome 04/18/2013  . Rotator cuff rupture, complete 07/05/2011    Past Surgical History:  Procedure Laterality Date  . ABDOMINAL HYSTERECTOMY  1979   partial done  . APPENDECTOMY  1979   when did hysterectomy  . BACK SURGERY     2009  . ENDOVENOUS ABLATION SAPHENOUS VEIN W/ LASER Right 09-15-2013   right greater saphenous vein by Gretta Began MD  . ENDOVENOUS ABLATION SAPHENOUS VEIN W/ LASER Left 10-16-2013   endovenous laser ablation left greater saphenous vein by Gretta Began MD  . left open rotator cuff surgery   2010   .  SHOULDER OPEN ROTATOR CUFF REPAIR  07/05/2011   Procedure: ROTATOR CUFF REPAIR SHOULDER OPEN;  Surgeon: Jacki Cones, MD;  Location: [facility];  Service: Orthopedics;  Laterality: Right;  . TOTAL KNEE ARTHROPLASTY Right 09/09/2014   Procedure: TOTAL RIGHT  KNEE ARTHROPLASTY;  Surgeon: Ranee Gosselin, MD;  Location: [facility];  Service: Orthopedics;  Laterality: Right;     OB History   None      Home Medications    Prior to Admission medications   Medication Sig Start Date End Date Taking? Authorizing Provider  acetaminophen (TYLENOL) 500 MG tablet Take 500-1,000 mg by mouth See admin instructions. Take 500mg  in the AM and 1000mg  in the PM.   Yes [provider]  aspirin 81 MG chewable tablet Chew 81 mg by mouth at bedtime.    Yes [provider]  B Complex Vitamins (VITAMIN-B COMPLEX) TABS Take 1 tablet by mouth daily.    Yes [provider]  Cholecalciferol (VITAMIN D3) 1000 units CAPS Take 1,000 Units by mouth daily.    Yes [provider]  levothyroxine (SYNTHROID, LEVOTHROID) 50 MCG tablet Take 50 mcg by mouth daily before breakfast.   Yes [provider]  losartan-hydrochlorothiazide (HYZAAR) 50-12.5 MG tablet Take 1 tablet by mouth daily.   Yes [provider]  meclizine (ANTIVERT) 25 MG tablet Take 25 mg by mouth 2 (two)  times daily. 03/26/18  Yes [provider]  Multiple Vitamins-Minerals (MULTIVITAMIN ADULTS PO) Take 1 tablet by mouth daily.    Yes [provider]  Multiple Vitamins-Minerals (PRESERVISION AREDS PO) Take 1 tablet by mouth 2 (two) times daily.    Yes [provider]  polyvinyl alcohol (LIQUIFILM TEARS) 1.4 % ophthalmic solution Place 1 drop into both eyes daily as needed for dry eyes.   Yes [provider]  simvastatin (ZOCOR) 20 MG tablet Take 20 mg by mouth at bedtime.    Yes [provider]  vitamin C (ASCORBIC ACID) 500 MG tablet Take 500 mg by mouth daily.   Yes  [provider]  atorvastatin (LIPITOR) 20 MG tablet Take 20 mg by mouth at bedtime.  02/25/18   [provider]  LORazepam (ATIVAN) 0.5 MG tablet Take 1 tablet (0.5 mg total) by mouth every 8 (eight) hours as needed (vertigo). 04/04/18   Benjiman Core, MD    Family History Family History  Problem Relation Age of Onset  . Heart disease Mother   . Cancer Mother   . Varicose Veins Mother   . Deep vein thrombosis Mother   . Diabetes Mother   . Hypertension Mother   . Heart disease Father   . Varicose Veins Sister   . Cancer Sister   . Diabetes Sister   . Heart disease Sister        before age 69  . Hypertension Sister   . Vision loss Sister   . Diabetes Brother   . Vision loss Brother   . Peripheral vascular disease Brother   . AAA (abdominal aortic aneurysm) Brother   . Diabetes Sister   . Heart disease Sister   . Hypertension Sister   . Vision loss Sister   . Diabetes Sister   . Vision loss Sister     Social History Social History   Tobacco Use  . Smoking status: Former Smoker    Packs/day: 0.25    Years: 13.00    Pack years: 3.25    Types: Cigarettes    Last attempt to quit: 06/22/1964    Years since quitting: 53.8  . Smokeless tobacco: Never Used  Substance Use Topics  . Alcohol use: No  . Drug use: No     Allergies   Codeine; Darvocet [propoxyphene n-acetaminophen]; Nickel; and Sulfa antibiotics   Review of Systems Review of Systems  Constitutional: Negative for appetite change.  HENT: Negative for congestion.   Respiratory: Negative for shortness of breath.   Cardiovascular: Negative for chest pain.  Gastrointestinal: Positive for nausea.  Genitourinary: Negative for flank pain.  Musculoskeletal: Negative for back pain.  Skin: Negative for rash.  Neurological: Positive for dizziness. Negative for weakness.     Physical Exam Updated Vital Signs BP (!) 179/76 (BP Location: Left Arm)   Pulse 72   Temp (!) 97.5 F (36.4 C)  (Oral)   Resp 16   Ht 5\' 3"  (1.6 m)   Wt 78.5 kg   SpO2 98%   BMI 30.65 kg/m   Physical Exam  Constitutional: She appears well-developed.  HENT:  Head: Normocephalic.  Eyes: Pupils are equal, round, and reactive to light.  Some nystagmus.  Does have episodes or becomes much more severe.  Neck: Neck supple.  Cardiovascular: Normal rate.  Pulmonary/Chest: She has no wheezes.  Abdominal: There is no tenderness.  Neurological: She is alert.  Skin: Skin is warm. Capillary refill takes less than 2 seconds.  ED Treatments / Results  Labs (all labs ordered are listed, but only abnormal results are displayed) Labs Reviewed  I-STAT CHEM 8, ED - Abnormal; Notable for the following components:      Result Value   Sodium 133 (*)    Chloride 95 (*)    All other components within normal limits  CBC WITH DIFFERENTIAL/PLATELET    EKG None  Radiology Mr Brain Wo Contrast  Result Date: 04/04/2018 CLINICAL DATA:  Persistent central vertigo EXAM: MRI HEAD WITHOUT CONTRAST TECHNIQUE: Multiplanar, multiecho pulse sequences of the brain and surrounding structures were obtained without intravenous contrast. COMPARISON:  03/06/2017 FINDINGS: Brain: No acute infarction, hemorrhage, hydrocephalus, extra-axial collection or mass lesion. Moderate patchy FLAIR hyperintensity in the cerebral white matter attributed to chronic small vessel ischemia given stability and medical history. Normal brain volume. No findings in the brainstem, cisterns, cerebellum, or temporal bones to explain vertigo. Vascular: Major flow voids are preserved, including vertebrobasilar Skull and upper cervical spine: Negative for marrow lesion Sinuses/Orbits: Bilateral cataract resection. IMPRESSION: 1. No acute finding or change from MRI last year. 2. Moderate chronic small vessel ischemia in the cerebral white matter. Electronically Signed   By: Marnee Spring M.D.   On: 04/04/2018 10:38    Procedures Procedures (including  critical care time)  Medications Ordered in ED Medications  LORazepam (ATIVAN) injection 0.25 mg (0.25 mg Intravenous Given 04/04/18 0953)     Initial Impression / Assessment and Plan / ED Course  I have reviewed the triage vital signs and the nursing notes.  Pertinent labs & imaging results that were available during my care of the patient were reviewed by me and considered in my medical decision making (see chart for details).    Patient with vertigo.  MRI done and does appear to be peripheral.  Symptoms seem to be worse with her laying to the left.  Epley maneuver done with full may be mild relief.  Also it had some Ativan.  Will discharge home with ENT follow-up.  Instructions given on home Epley maneuver.  Final Clinical Impressions(s) / ED Diagnoses   Final diagnoses:  Peripheral vertigo, unspecified laterality    ED Discharge Orders         Ordered    LORazepam (ATIVAN) 0.5 MG tablet  Every 8 hours PRN     04/04/18 1157           Benjiman Core, MD 04/04/18 1159

## 2018-04-04 NOTE — ED Triage Notes (Addendum)
Pt states that she has been dealing with vertigo for 2 weeks. Pt reports that the meclizine she was given initially helped, but has since stopped. Pt states she woke up around 1am, with the room spinning with her eyes both open and shut. Pt states mild improvement when laying on her right side. Pt states she cannot get her eyes to focus, and reports very short moments of bilateral sharp ear pain.

## 2018-05-10 DIAGNOSIS — M25552 Pain in left hip: Secondary | ICD-10-CM | POA: Insufficient documentation

## 2018-05-17 DIAGNOSIS — M545 Low back pain, unspecified: Secondary | ICD-10-CM | POA: Insufficient documentation

## 2018-06-05 ENCOUNTER — Other Ambulatory Visit: Payer: Self-pay | Admitting: Physician Assistant

## 2018-06-05 DIAGNOSIS — Z1231 Encounter for screening mammogram for malignant neoplasm of breast: Secondary | ICD-10-CM

## 2018-07-04 ENCOUNTER — Ambulatory Visit: Payer: Medicare Other

## 2018-07-23 ENCOUNTER — Ambulatory Visit: Payer: Medicare Other

## 2018-09-03 ENCOUNTER — Ambulatory Visit: Payer: Medicare Other

## 2018-10-28 ENCOUNTER — Ambulatory Visit: Payer: Medicare Other | Admitting: Family Medicine

## 2018-11-06 ENCOUNTER — Telehealth: Payer: Self-pay

## 2018-11-06 NOTE — Telephone Encounter (Signed)
Copied from [id number] [phone] [id number]. Topic: General - Other >> Nov 06, 2018  2:21 PM Celene Kras A wrote: Reason for CRM: Pt called stating she has fallen and bruised her ribs and has been experiencing episodes of vertigo. Pt is requesting a new pt appt sooner than 12/10/2018. Please advise.

## 2018-11-06 NOTE — Telephone Encounter (Signed)
Spoke to patient.  She and her daughter were on the call.  She had fallen on 5/29 and hurt her ribs; was seen at Urgent Care to be evaluated for this.  Developed vertigo on 6/6 and denies any n/v/blurry vision and is asymptomatic otherwise.    Per Dr. Juleen China, patient was advised to go to ED for evaluation.  Original appt was scheduled for 7/14 to establish care.  After giving all advice, patient repeatedly kept asking if there was any way that appt could be moved up sooner.  Her daughter seemed very concerned about getting her Ativan rx renewed also and mentioned this several times throughout the conversation.  Patient and daughter did not seem to accept the advice I was offering and I had to repeat myself at least 5-6 times.  I explained multiple times repeatedly that Dr. Juleen China was not her physician, does not know her medical history and cannot legally treat her.  They ultimately declined to go to ED and I advised if they were declining Dr. Alcario Drought advice, they should reach out to her patient's current PCP for further advice.  Patient and daughter both verbalized understanding.

## 2018-11-07 ENCOUNTER — Other Ambulatory Visit: Payer: Self-pay

## 2018-11-07 ENCOUNTER — Emergency Department (HOSPITAL_COMMUNITY)
Admission: EM | Admit: 2018-11-07 | Discharge: 2018-11-07 | Disposition: A | Discharge status: Home / Self Care | Payer: Medicare Other | Attending: Emergency Medicine | Admitting: Emergency Medicine

## 2018-11-07 ENCOUNTER — Encounter (HOSPITAL_COMMUNITY): Payer: Self-pay | Admitting: Emergency Medicine

## 2018-11-07 DIAGNOSIS — Z79899 Other long term (current) drug therapy: Secondary | ICD-10-CM | POA: Insufficient documentation

## 2018-11-07 DIAGNOSIS — M545 Low back pain: Secondary | ICD-10-CM | POA: Insufficient documentation

## 2018-11-07 DIAGNOSIS — R42 Dizziness and giddiness: Secondary | ICD-10-CM

## 2018-11-07 DIAGNOSIS — Z7982 Long term (current) use of aspirin: Secondary | ICD-10-CM | POA: Insufficient documentation

## 2018-11-07 DIAGNOSIS — E039 Hypothyroidism, unspecified: Secondary | ICD-10-CM | POA: Diagnosis not present

## 2018-11-07 DIAGNOSIS — Z86718 Personal history of other venous thrombosis and embolism: Secondary | ICD-10-CM | POA: Insufficient documentation

## 2018-11-07 DIAGNOSIS — I739 Peripheral vascular disease, unspecified: Secondary | ICD-10-CM | POA: Insufficient documentation

## 2018-11-07 DIAGNOSIS — I10 Essential (primary) hypertension: Secondary | ICD-10-CM | POA: Insufficient documentation

## 2018-11-07 MED ORDER — LORAZEPAM 0.5 MG PO TABS: 1.0000 mg | ORAL_TABLET | Freq: Three times a day (TID) | ORAL | 0 refills | Status: DC | PRN

## 2018-11-07 NOTE — ED Notes (Signed)
Patient verbalizes understanding of discharge instructions. Opportunity for questioning and answering were provided.  patient discharged from ED.  Family member here to pick patient up

## 2018-11-07 NOTE — Telephone Encounter (Signed)
Looks like she was only given 2 scripts in last year for 6 days each. Do you want me to make app?

## 2018-11-07 NOTE — ED Triage Notes (Signed)
Pt states she has been dealing with bouts of vertigo for years. Pt had a fall related to vertigo in May and was evaluated for it. Pt states she has continued to have pain on her left side since her fall. Pt has visible bruising. Pt also complains that her vertigo has been bothering her for a week or so. Denies dizziness at this time. Denies CP/SOB. Pt states she is switching MDs and was told she should be evaluated in the ED prior to going to her new MD.

## 2018-11-07 NOTE — Discharge Instructions (Addendum)
Follow-up with your new doctor as scheduled.  Return here for any problems.

## 2018-11-07 NOTE — ED Provider Notes (Signed)
[facility] Provider Note   CSN: [id number] Arrival date & time: 11/07/18  1331     History   Chief Complaint Chief Complaint  Patient presents with  . Fall  . Dizziness    HPI Edsel Petrinatsy S Smotherman is a 78 y.o. female.     78 year old female presents with concern for recurrent vertigo as well as left-sided back soreness.  Patient states that she had a fall several days ago and that was treated at urgent care and had negative rib x-rays.  Since that time she denies any blood in her urine.  She has not been short of breath.  Denies any chest pain.  Also has another complaint of recurrent vertigo which is been treated for quite some time with Ativan.  States that she is currently running out of her Ativan.  Is in between doctors now and called her new physician and was instructed to come here for any acute complaints prior to them establishing care with her.  Denies any vertiginous complaints.  Denies any headache.  No ataxia.     Past Medical History:  Diagnosis Date  . Arthritis   . BBB (bundle branch block)    Left  . DVT (deep venous thrombosis) (HCC)   . Family history of blood clots   . Hypertension   . Hypothyroidism   . Left bundle branch block    patient reports history of for last several years   . Peripheral vascular disease (HCC)   . Varicose veins     Patient Active Problem List   Diagnosis Date Noted  . History of total knee arthroplasty 09/09/2014  . Swelling of limb 09/02/2013  . Peripheral vascular disease, unspecified (HCC) 04/18/2013  . Chronic venous insufficiency 04/18/2013  . Varicose veins of lower extremities with other complications 04/18/2013  . Post-phlebitic syndrome 04/18/2013  . Rotator cuff rupture, complete 07/05/2011    Past Surgical History:  Procedure Laterality Date  . ABDOMINAL HYSTERECTOMY  1979   partial done  . APPENDECTOMY  1979   when did hysterectomy  . BACK SURGERY     2009  .  ENDOVENOUS ABLATION SAPHENOUS VEIN W/ LASER Right 09-15-2013   right greater saphenous vein by Gretta Beganodd Early MD  . ENDOVENOUS ABLATION SAPHENOUS VEIN W/ LASER Left 10-16-2013   endovenous laser ablation left greater saphenous vein by Gretta Beganodd Early MD  . left open rotator cuff surgery   2010   . SHOULDER OPEN ROTATOR CUFF REPAIR  07/05/2011   Procedure: ROTATOR CUFF REPAIR SHOULDER OPEN;  Surgeon: Jacki Conesonald A Gioffre, MD;  Location: [facility];  Service: Orthopedics;  Laterality: Right;  . TOTAL KNEE ARTHROPLASTY Right 09/09/2014   Procedure: TOTAL RIGHT  KNEE ARTHROPLASTY;  Surgeon: Ranee Gosselinonald Gioffre, MD;  Location: [facility];  Service: Orthopedics;  Laterality: Right;     OB History   No obstetric history on file.      Home Medications    Prior to Admission medications   Medication Sig Start Date End Date Taking? Authorizing Provider  acetaminophen (TYLENOL) 500 MG tablet Take 500-1,000 mg by mouth See admin instructions. Take 500mg  in the AM and 1000mg  in the PM.    [provider]  aspirin 81 MG chewable tablet Chew 81 mg by mouth at bedtime.     [provider]  atorvastatin (LIPITOR) 20 MG tablet Take 20 mg by mouth at bedtime.  02/25/18   [provider]  B Complex Vitamins (VITAMIN-B COMPLEX) TABS Take  1 tablet by mouth daily.     [provider]  Cholecalciferol (VITAMIN D3) 1000 units CAPS Take 1,000 Units by mouth daily.     [provider]  levothyroxine (SYNTHROID, LEVOTHROID) 50 MCG tablet Take 50 mcg by mouth daily before breakfast.    [provider]  LORazepam (ATIVAN) 0.5 MG tablet Take 1 tablet (0.5 mg total) by mouth every 8 (eight) hours as needed (vertigo). 04/04/18   Benjiman CorePickering, Nathan, MD  losartan-hydrochlorothiazide (HYZAAR) 50-12.5 MG tablet Take 1 tablet by mouth daily.    [provider]  meclizine (ANTIVERT) 25 MG tablet Take 25 mg by mouth 2 (two) times daily. 03/26/18   [provider]  Multiple  Vitamins-Minerals (MULTIVITAMIN ADULTS PO) Take 1 tablet by mouth daily.     [provider]  Multiple Vitamins-Minerals (PRESERVISION AREDS PO) Take 1 tablet by mouth 2 (two) times daily.     [provider]  polyvinyl alcohol (LIQUIFILM TEARS) 1.4 % ophthalmic solution Place 1 drop into both eyes daily as needed for dry eyes.    [provider]  simvastatin (ZOCOR) 20 MG tablet Take 20 mg by mouth at bedtime.     [provider]  vitamin C (ASCORBIC ACID) 500 MG tablet Take 500 mg by mouth daily.    [provider]    Family History Family History  Problem Relation Age of Onset  . Heart disease Mother   . Cancer Mother   . Varicose Veins Mother   . Deep vein thrombosis Mother   . Diabetes Mother   . Hypertension Mother   . Heart disease Father   . Varicose Veins Sister   . Cancer Sister   . Diabetes Sister   . Heart disease Sister        before age 80  . Hypertension Sister   . Vision loss Sister   . Diabetes Brother   . Vision loss Brother   . Peripheral vascular disease Brother   . AAA (abdominal aortic aneurysm) Brother   . Diabetes Sister   . Heart disease Sister   . Hypertension Sister   . Vision loss Sister   . Diabetes Sister   . Vision loss Sister     Social History Social History   Tobacco Use  . Smoking status: Former Smoker    Packs/day: 0.25    Years: 13.00    Pack years: 3.25    Types: Cigarettes    Quit date: 06/22/1964    Years since quitting: 54.4  . Smokeless tobacco: Never Used  Substance Use Topics  . Alcohol use: No  . Drug use: No     Allergies   Codeine, Darvocet [propoxyphene n-acetaminophen], Nickel, and Sulfa antibiotics   Review of Systems Review of Systems  All other systems reviewed and are negative.    Physical Exam Updated Vital Signs BP (!) 161/68 (BP Location: Right Arm)   Pulse 89   Temp 98.4 F (36.9 C) (Oral)   Resp 16   Ht 1.549 m (5\' 1" )   Wt 79.4 kg   SpO2 98%    BMI 33.07 kg/m   Physical Exam Vitals signs and nursing note reviewed.  Constitutional:      General: She is not in acute distress.    Appearance: Normal appearance. She is well-developed. She is not toxic-appearing.  HENT:     Head: Normocephalic and atraumatic.  Eyes:     General: Lids are normal.     Conjunctiva/sclera: Conjunctivae  normal.     Pupils: Pupils are equal, round, and reactive to light.  Neck:     Musculoskeletal: Normal range of motion and neck supple.     Thyroid: No thyroid mass.     Trachea: No tracheal deviation.  Cardiovascular:     Rate and Rhythm: Normal rate and regular rhythm.     Heart sounds: Normal heart sounds. No murmur. No gallop.   Pulmonary:     Effort: Pulmonary effort is normal. No respiratory distress.     Breath sounds: Normal breath sounds. No stridor. No decreased breath sounds, wheezing, rhonchi or rales.  Chest:       Comments: Resolving bruising noted Abdominal:     General: Bowel sounds are normal. There is no distension.     Palpations: Abdomen is soft.     Tenderness: There is no abdominal tenderness. There is no guarding or rebound.     Comments: No left upper quadrant tenderness.  Musculoskeletal: Normal range of motion.        General: No tenderness.  Skin:    General: Skin is warm and dry.     Findings: No abrasion or rash.  Neurological:     Mental Status: She is alert and oriented to person, place, and time.     GCS: GCS eye subscore is 4. GCS verbal subscore is 5. GCS motor subscore is 6.     Cranial Nerves: No cranial nerve deficit.     Sensory: No sensory deficit.     Motor: No weakness or tremor.     Coordination: Finger-Nose-Finger Test normal.     Comments: No nystagmus  Psychiatric:        Speech: Speech normal.        Behavior: Behavior normal.      ED Treatments / Results  Labs (all labs ordered are listed, but only abnormal results are displayed) Labs Reviewed - No data to display  EKG     Radiology No results found.  Procedures Procedures (including critical care time)  Medications Ordered in ED Medications - No data to display   Initial Impression / Assessment and Plan / ED Course  I have reviewed the triage vital signs and the nursing notes.  Pertinent labs & imaging results that were available during my care of the patient were reviewed by me and considered in my medical decision making (see chart for details).        Patient with stable pulse ox as well as respiratory rate.  Her old contusion appears to be healing well.  She has no acute signs of vertigo at this time.  Denies any recent illnesses such as chest pain or shortness of breath.  No severe headaches.  Otherwise feels okay.  Pain at her side is worse with movement is consistent with musculoskeletal.  Will refill her Ativan and instruct her to follow-up with her doctor as needed  Final Clinical Impressions(s) / ED Diagnoses   Final diagnoses:  None    ED Discharge Orders    None       Lacretia Leigh, MD 11/07/18 1433

## 2018-11-07 NOTE — Telephone Encounter (Signed)
Check database to make sure there are no concerns for Ativan abuse. If no concerns, okay to call patient to schedule a new patient visit next week in office. 40 minutes please.

## 2018-11-08 NOTE — Telephone Encounter (Signed)
Schedule appointment?

## 2018-11-08 NOTE — Telephone Encounter (Signed)
Yes, make NEW PATIENT appointment for next week. Better in person if no red flags.

## 2018-11-11 NOTE — Telephone Encounter (Signed)
Appointment already scheduled for pt for a 40 min visit on 11/13/18.

## 2018-11-13 ENCOUNTER — Encounter: Payer: Self-pay | Admitting: Family Medicine

## 2018-11-13 ENCOUNTER — Other Ambulatory Visit: Payer: Self-pay

## 2018-11-13 ENCOUNTER — Ambulatory Visit (INDEPENDENT_AMBULATORY_CARE_PROVIDER_SITE_OTHER): Payer: Medicare Other | Admitting: Family Medicine

## 2018-11-13 VITALS — BP 130/80 | HR 84 | Temp 98.4°F | Ht 61.0 in | Wt 175.5 lb

## 2018-11-13 DIAGNOSIS — L304 Erythema intertrigo: Secondary | ICD-10-CM | POA: Diagnosis not present

## 2018-11-13 DIAGNOSIS — I10 Essential (primary) hypertension: Secondary | ICD-10-CM

## 2018-11-13 DIAGNOSIS — E7800 Pure hypercholesterolemia, unspecified: Secondary | ICD-10-CM

## 2018-11-13 DIAGNOSIS — E538 Deficiency of other specified B group vitamins: Secondary | ICD-10-CM | POA: Diagnosis not present

## 2018-11-13 DIAGNOSIS — R42 Dizziness and giddiness: Secondary | ICD-10-CM

## 2018-11-13 DIAGNOSIS — J301 Allergic rhinitis due to pollen: Secondary | ICD-10-CM

## 2018-11-13 DIAGNOSIS — E039 Hypothyroidism, unspecified: Secondary | ICD-10-CM | POA: Diagnosis not present

## 2018-11-13 DIAGNOSIS — E669 Obesity, unspecified: Secondary | ICD-10-CM

## 2018-11-13 NOTE — Progress Notes (Signed)
Amber [name] is a 78 y.o. [facility]   Patient Care Team: Helane RimaWallace, Claressa Hughley, DO as PCP - General (Family Medicine) Helane RimaWallace, Elizebeth Kluesner, DO (Family Medicine)   History of Present Illness:   Dizziness This is a recurrent problem. The current episode started 1 to 4 weeks ago. The problem occurs intermittently. The problem has been waxing and waning. Associated symptoms include vertigo. Pertinent negatives include no abdominal pain, anorexia, arthralgias, change in bowel habit, chest pain, chills, congestion, coughing, diaphoresis, fatigue, fever, headaches, joint swelling, myalgias, nausea, neck pain, numbness, rash, sore throat, swollen glands, urinary symptoms, visual change, vomiting or weakness. The symptoms are aggravated by bending. She has tried lying down and sleep for the symptoms. The treatment provided mild relief.   1. Dizziness   2. B12 deficiency   3. Acquired hypothyroidism   4. Intertrigo   5. Essential hypertension   6. Hypercholesterolemia   7. Seasonal allergic rhinitis due to pollen   8. Obesity (BMI 30.0-34.9)    Health Maintenance Due  Topic Date Due  . TETANUS/TDAP  07/20/1959  . PNA vac Low Risk Adult (2 of 2 - PCV13) 11/06/2013   No flowsheet data found.  PMHx, SurgHx, SocialHx, Medications, and Allergies were reviewed in the Visit Navigator and updated as appropriate.   Past Medical History:  Diagnosis Date  . Arthritis   . DVT (deep venous thrombosis) (HCC)   . Family history of blood clots   . Hypertension   . Hypothyroidism   . Left bundle branch block   . Peripheral vascular disease (HCC)   . Post-phlebitic syndrome 04/18/2013  . Rotator cuff rupture, complete 07/05/2011  . Varicose veins      Past Surgical History:  Procedure Laterality Date  . APPENDECTOMY  1979  . BACK SURGERY  2009  . ENDOVENOUS ABLATION SAPHENOUS VEIN W/ LASER Right 09/15/2013   right greater saphenous vein, by Gretta Beganodd Early MD  . ENDOVENOUS ABLATION  SAPHENOUS VEIN W/ LASER Left 10/16/2013   left greater saphenous vein, by Gretta Beganodd Early MD  . left open rotator cuff surgery   2010   . lumbar infusion  01/16/2008  . PARTIAL HYSTERECTOMY  1979  . SHOULDER OPEN ROTATOR CUFF REPAIR  07/05/2011   Procedure: ROTATOR CUFF REPAIR SHOULDER OPEN;  Surgeon: Jacki Conesonald A Gioffre, MD;  Location: [facility];  Service: Orthopedics;  Laterality: Right;  . TOTAL KNEE ARTHROPLASTY Right 09/09/2014   Procedure: TOTAL RIGHT  KNEE ARTHROPLASTY;  Surgeon: Ranee Gosselinonald Gioffre, MD;  Location: [facility];  Service: Orthopedics;  Laterality: Right;     Family History  Problem Relation Age of Onset  . Heart disease Mother   . Cancer Mother   . Varicose Veins Mother   . Deep vein thrombosis Mother   . Diabetes Mother   . Hypertension Mother   . Heart disease Father   . Varicose Veins Sister   . Cancer Sister   . Diabetes Sister   . Heart disease Sister 360  . Hypertension Sister   . Vision loss Sister   . Diabetes Brother   . Vision loss Brother   . Peripheral vascular disease Brother   . AAA (abdominal aortic aneurysm) Brother   . Diabetes Sister   . Heart disease Sister   . Hypertension Sister   . Vision loss Sister   . Diabetes Sister   . Vision loss Sister     Social History   Tobacco Use  . Smoking status: Former Smoker  Packs/day: 0.25    Years: 13.00    Pack years: 3.25    Types: Cigarettes    Quit date: 06/22/1964    Years since quitting: 54.4  . Smokeless tobacco: Never Used  Substance Use Topics  . Alcohol use: No  . Drug use: No   Current Medications and Allergies   Current Outpatient Medications:  .  acetaminophen (TYLENOL) 500 MG tablet, Take 500-1,000 mg by mouth See admin instructions. Take 500mg  in the AM and 1000mg  in the PM., Disp: , Rfl:  .  aspirin 81 MG chewable tablet, Chew 81 mg by mouth at bedtime. , Disp: , Rfl:  .  atorvastatin (LIPITOR) 20 MG tablet, Take 20 mg by mouth at bedtime. , Disp: , Rfl:  .  B Complex Vitamins  (VITAMIN-B COMPLEX) TABS, Take 1 tablet by mouth daily. , Disp: , Rfl:  .  Cholecalciferol (VITAMIN D3) 1000 units CAPS, Take 1,000 Units by mouth daily. , Disp: , Rfl:  .  levothyroxine (SYNTHROID, LEVOTHROID) 50 MCG tablet, Take 50 mcg by mouth daily before breakfast., Disp: , Rfl:  .  LORazepam (ATIVAN) 0.5 MG tablet, Take 1 tablet (0.5 mg total) by mouth every 8 (eight) hours as needed (vertigo)., Disp: 6 tablet, Rfl: 0 .  losartan-hydrochlorothiazide (HYZAAR) 50-12.5 MG tablet, Take 1 tablet by mouth daily., Disp: , Rfl:  .  Multiple Vitamins-Minerals (PRESERVISION AREDS PO), Take 1 tablet by mouth 2 (two) times daily. , Disp: , Rfl:  .  oxybutynin (DITROPAN) 5 MG tablet, oxybutynin chloride 5 mg tablet, Disp: , Rfl:  .  polyvinyl alcohol (LIQUIFILM TEARS) 1.4 % ophthalmic solution, Place 1 drop into both eyes daily as needed for dry eyes., Disp: , Rfl:  .  vitamin C (ASCORBIC ACID) 500 MG tablet, Take 500 mg by mouth daily., Disp: , Rfl:  .  clobetasol ointment (TEMOVATE) 0.05 %, clobetasol 0.05 % topical ointment, Disp: , Rfl:  .  meclizine (ANTIVERT) 25 MG tablet, Take 25 mg by mouth 2 (two) times daily., Disp: , Rfl: 0 .  nystatin (NYAMYC) powder, Nyamyc 100,000 unit/gram topical powder, Disp: , Rfl:    Allergies  Allergen Reactions  . Codeine Nausea And Vomiting  . Darvocet [Propoxyphene N-Acetaminophen] Nausea And Vomiting  . Nickel Rash  . Sulfa Antibiotics Rash    Like a sunburn   Review of Systems   Pertinent items are noted in the HPI. Otherwise, a complete ROS is negative.  Vitals   Vitals:   11/13/18 1518  BP: 130/80  Pulse: 84  Temp: 98.4 F (36.9 C)  TempSrc: Oral  SpO2: 97%  Weight: 175 lb 8 oz (79.6 kg)  Height: 5\' 1"  (1.549 m)     Body mass index is 33.16 kg/m.  Physical Exam   Physical Exam Vitals signs and nursing note reviewed.  HENT:     Head: Normocephalic and atraumatic.  Eyes:     Pupils: Pupils are equal, round, and reactive to light.    Neck:     Musculoskeletal: Normal range of motion and neck supple.  Cardiovascular:     Rate and Rhythm: Normal rate and regular rhythm.     Heart sounds: Normal heart sounds.  Pulmonary:     Effort: Pulmonary effort is normal.  Abdominal:     Palpations: Abdomen is soft.  Skin:    General: Skin is warm.     Capillary Refill: Capillary refill takes less than 2 seconds.  Neurological:     General: No focal  deficit present.     Mental Status: She is oriented to person, place, and time.     Cranial Nerves: No cranial nerve deficit.     Sensory: No sensory deficit.     Coordination: Coordination normal.     Gait: Gait normal.  Psychiatric:        Mood and Affect: Mood normal.        Behavior: Behavior normal.        Thought Content: Thought content normal.        Judgment: Judgment normal.    Results for orders placed or performed in visit on 11/13/18  CBC with Differential/Platelet  Result Value Ref Range   WBC 9.3 4.0 - 10.5 K/uL   RBC 4.27 3.87 - 5.11 Mil/uL   Hemoglobin 13.8 12.0 - 15.0 g/dL   HCT 96.040.5 45.436.0 - 09.846.0 %   MCV 95.0 78.0 - 100.0 fl   MCHC 34.0 30.0 - 36.0 g/dL   RDW 11.913.1 14.711.5 - 82.915.5 %   Platelets 206.0 150.0 - 400.0 K/uL   Neutrophils Relative % 69.8 43.0 - 77.0 %   Lymphocytes Relative 21.1 12.0 - 46.0 %   Monocytes Relative 7.5 3.0 - 12.0 %   Eosinophils Relative 1.2 0.0 - 5.0 %   Basophils Relative 0.4 0.0 - 3.0 %   Neutro Abs 6.5 1.4 - 7.7 K/uL   Lymphs Abs 2.0 0.7 - 4.0 K/uL   Monocytes Absolute 0.7 0.1 - 1.0 K/uL   Eosinophils Absolute 0.1 0.0 - 0.7 K/uL   Basophils Absolute 0.0 0.0 - 0.1 K/uL  Comprehensive metabolic panel  Result Value Ref Range   Sodium 136 135 - 145 mEq/L   Potassium 3.7 3.5 - 5.1 mEq/L   Chloride 96 96 - 112 mEq/L   CO2 31 19 - 32 mEq/L   Glucose, Bld 88 70 - 99 mg/dL   BUN 14 6 - 23 mg/dL   Creatinine, Ser 5.620.63 0.40 - 1.20 mg/dL   Total Bilirubin 1.5 (H) 0.2 - 1.2 mg/dL   Alkaline Phosphatase 83 39 - 117 U/L   AST 16  0 - 37 U/L   ALT 16 0 - 35 U/L   Total Protein 6.2 6.0 - 8.3 g/dL   Albumin 4.4 3.5 - 5.2 g/dL   Calcium 9.3 8.4 - 13.010.5 mg/dL   GFR 86.5790.85 >84.69>60.00 mL/min  TSH  Result Value Ref Range   TSH 0.71 0.35 - 4.50 uIU/mL  Vitamin B12  Result Value Ref Range   Vitamin B-12 688 211 - 911 pg/mL  Iron, TIBC and Ferritin Panel  Result Value Ref Range   Iron 128 45 - 160 mcg/dL   TIBC 629376 528250 - 413450 mcg/dL (calc)   %SAT 34 16 - 45 % (calc)   Ferritin 57 16 - 288 ng/mL  Magnesium  Result Value Ref Range   Magnesium 1.9 1.5 - 2.5 mg/dL   Assessment and Plan   Amber [name] was seen today for establish care.  Diagnoses and all orders for this visit:  Dizziness Comments: Symptoms have improved significantly already. Consistant with vertigo. Hx of the same. Recent allergy symptoms. Preacutions reviewed.  Orders: -     CBC with Differential/Platelet -     Comprehensive metabolic panel -     TSH -     Iron, TIBC and Ferritin Panel -     Magnesium  B12 deficiency -     Vitamin B12  Acquired hypothyroidism  Intertrigo  Essential hypertension  Hypercholesterolemia  Seasonal allergic rhinitis due to pollen  Obesity (BMI 30.0-34.9)    . Orders and follow up as documented in [place], reviewed diet, exercise and weight control, cardiovascular risk and specific lipid/LDL goals reviewed, reviewed medications and side effects in detail.  . Reviewed expectations re: course of current medical issues. . Outlined signs and symptoms indicating need for more acute intervention. . Patient verbalized understanding and all questions were answered. . Patient received an After Visit Summary.  Briscoe Deutscher, DO [place], [place] 11/21/2018  Records requested if needed. Time spent: 45 minutes, of which >50% was spent in obtaining information about her symptoms, reviewing her previous labs, evaluations, and treatments, counseling her about her condition (please see the discussed topics above), and  developing a plan to further investigate it; she had a number of questions which I addressed.

## 2018-11-15 ENCOUNTER — Other Ambulatory Visit (INDEPENDENT_AMBULATORY_CARE_PROVIDER_SITE_OTHER): Payer: Medicare Other

## 2018-11-15 ENCOUNTER — Other Ambulatory Visit: Payer: Self-pay

## 2018-11-15 DIAGNOSIS — R42 Dizziness and giddiness: Secondary | ICD-10-CM | POA: Diagnosis not present

## 2018-11-15 DIAGNOSIS — E039 Hypothyroidism, unspecified: Secondary | ICD-10-CM | POA: Diagnosis not present

## 2018-11-15 DIAGNOSIS — E538 Deficiency of other specified B group vitamins: Secondary | ICD-10-CM | POA: Diagnosis not present

## 2018-11-15 LAB — CBC WITH DIFFERENTIAL/PLATELET
Basophils Absolute: 0 10*3/uL (ref 0.0–0.1)
Basophils Relative: 0.4 % (ref 0.0–3.0)
Eosinophils Absolute: 0.1 10*3/uL (ref 0.0–0.7)
Eosinophils Relative: 1.2 % (ref 0.0–5.0)
HCT: 40.5 % (ref 36.0–46.0)
Hemoglobin: 13.8 g/dL (ref 12.0–15.0)
Lymphocytes Relative: 21.1 % (ref 12.0–46.0)
Lymphs Abs: 2 10*3/uL (ref 0.7–4.0)
MCHC: 34 g/dL (ref 30.0–36.0)
MCV: 95 fl (ref 78.0–100.0)
Monocytes Absolute: 0.7 10*3/uL (ref 0.1–1.0)
Monocytes Relative: 7.5 % (ref 3.0–12.0)
Neutro Abs: 6.5 10*3/uL (ref 1.4–7.7)
Neutrophils Relative %: 69.8 % (ref 43.0–77.0)
Platelets: 206 10*3/uL (ref 150.0–400.0)
RBC: 4.27 Mil/uL (ref 3.87–5.11)
RDW: 13.1 % (ref 11.5–15.5)
WBC: 9.3 10*3/uL (ref 4.0–10.5)

## 2018-11-15 LAB — COMPREHENSIVE METABOLIC PANEL
ALT: 16 U/L (ref 0–35)
AST: 16 U/L (ref 0–37)
Albumin: 4.4 g/dL (ref 3.5–5.2)
Alkaline Phosphatase: 83 U/L (ref 39–117)
BUN: 14 mg/dL (ref 6–23)
CO2: 31 mEq/L (ref 19–32)
Calcium: 9.3 mg/dL (ref 8.4–10.5)
Chloride: 96 mEq/L (ref 96–112)
Creatinine, Ser: 0.63 mg/dL (ref 0.40–1.20)
GFR: 90.85 mL/min (ref >60.00)
Glucose, Bld: 88 mg/dL (ref 70–99)
Potassium: 3.7 mEq/L (ref 3.5–5.1)
Sodium: 136 mEq/L (ref 135–145)
Total Bilirubin: 1.5 mg/dL — ABNORMAL HIGH (ref 0.2–1.2)
Total Protein: 6.2 g/dL (ref 6.0–8.3)

## 2018-11-15 LAB — TSH: TSH: 0.71 u[IU]/mL (ref 0.35–4.50)

## 2018-11-15 LAB — MAGNESIUM: Magnesium: 1.9 mg/dL (ref 1.5–2.5)

## 2018-11-15 LAB — VITAMIN B12: Vitamin B-12: 688 pg/mL (ref 211–911)

## 2018-11-16 LAB — IRON,TIBC AND FERRITIN PANEL
%SAT: 34 % (calc) (ref 16–45)
Ferritin: 57 ng/mL (ref 16–288)
Iron: 128 ug/dL (ref 45–160)
TIBC: 376 mcg/dL (calc) (ref 250–450)

## 2018-11-21 ENCOUNTER — Encounter: Payer: Self-pay | Admitting: Family Medicine

## 2018-12-10 ENCOUNTER — Ambulatory Visit: Payer: Medicare Other | Admitting: Family Medicine

## 2018-12-19 ENCOUNTER — Telehealth: Payer: Self-pay

## 2018-12-19 NOTE — Telephone Encounter (Signed)
Copied from [place] [phone]. Topic: General - Other >> Dec 19, 2018 10:38 AM Rainey Pines A wrote: Patient called to inform Dr .Juleen China that her vertigo came back and that she has been experience this since yesterday. Patient would like a callback from Dr .Juleen China nurse.

## 2018-12-20 NOTE — Telephone Encounter (Signed)
Patient started taking medication for vertigo and she has not had any symptoms today. If she does start having issues she will get evaluation over the weekend.   She is currently taking Lorazepam for vertigo. She does have some Meclizine but has not been taken it. She will try and see if it helps. Reviewed all red words she if any changes she will go to ED. She will give me a call Monday and let me know how she is feeling.

## 2018-12-23 ENCOUNTER — Telehealth: Payer: Self-pay | Admitting: Family Medicine

## 2018-12-23 NOTE — Telephone Encounter (Signed)
Pt is requesting a refill for   atorvastatin (LIPITOR) 20 MG tablet  losartan hydrochlorothiazide (HYZAAR) 50-12.5 MG tablet   [facility]   Phone: [phone]  To give verbal request for refill.

## 2018-12-23 NOTE — Telephone Encounter (Signed)
See note

## 2018-12-24 ENCOUNTER — Other Ambulatory Visit: Payer: Self-pay

## 2018-12-24 MED ORDER — ATORVASTATIN CALCIUM 20 MG PO TABS: 20.0000 mg | ORAL_TABLET | Freq: Every day | ORAL | 1 refills | Status: DC

## 2018-12-24 MED ORDER — LOSARTAN POTASSIUM-HCTZ 50-12.5 MG PO TABS: 1.0000 | ORAL_TABLET | Freq: Every day | ORAL | 1 refills | Status: DC

## 2018-12-24 NOTE — Telephone Encounter (Signed)
Refills sent in to mail order.

## 2019-01-15 ENCOUNTER — Other Ambulatory Visit: Payer: Self-pay | Admitting: Family Medicine

## 2019-01-15 MED ORDER — LOSARTAN POTASSIUM-HCTZ 50-12.5 MG PO TABS: 1.0000 | ORAL_TABLET | Freq: Every day | ORAL | 0 refills | Status: DC

## 2019-01-15 NOTE — Telephone Encounter (Incomplete)
Medication Refill - Medication: losartan-hydrochlorothiazide (HYZAAR) 50-12.5 MG tablet  Pt will be out of medication this Saturday. Requesting 15 pills [facility].  Has the patient contacted their pharmacy? {yes [id number]} (Agent: If no, request that the patient contact the pharmacy for the refill.) (Agent: If yes, when and what did the pharmacy advise?)  Preferred Pharmacy (with phone number or street name):  [facility], [address] AT [address]  [place] [address]  Phone: [phone] Fax: [phone]    Agent: Please be advised that RX refills may take up to 3 business days. We ask that you follow-up with your pharmacy.

## 2019-01-15 NOTE — Telephone Encounter (Addendum)
Duplicate note.  Refill for 15 tablets, because of a delay from [facility].

## 2019-01-15 NOTE — Telephone Encounter (Signed)
Need 15 pills until her refill comes in from [place]. To be sent in to [place]

## 2019-01-27 ENCOUNTER — Other Ambulatory Visit: Payer: Self-pay | Admitting: Family Medicine

## 2019-01-27 NOTE — Telephone Encounter (Signed)
Copied from [place] [id number]. Topic: Quick Communication - Rx Refill/Question >> Jan 27, 2019  5:36 PM Mcneil, Jacinto Reap wrote: Pt stated she spoke with [facility] and she was told that they still do not have the losartan-hydrochlorothiazide (HYZAAR) 50-12.5 MG tablet. Pt request 90 day supply.  Medication: losartan-hydrochlorothiazide (HYZAAR) 50-12.5 MG tablet  Has the patient contacted their pharmacy? yes   Preferred Pharmacy (with phone number or street name): [facility] [place], [address] AT [address] [facility] [phone] (Phone)  [phone] (Fax)  Agent: Please be advised that RX refills may take up to 3 business days. We ask that you follow-up with your pharmacy.

## 2019-01-29 ENCOUNTER — Other Ambulatory Visit: Payer: Self-pay | Admitting: Family Medicine

## 2019-02-04 ENCOUNTER — Other Ambulatory Visit: Payer: Self-pay

## 2019-02-04 MED ORDER — LOSARTAN POTASSIUM-HCTZ 50-12.5 MG PO TABS: 1.0000 | ORAL_TABLET | Freq: Every day | ORAL | 0 refills | Status: DC

## 2019-02-04 MED ORDER — ATORVASTATIN CALCIUM 20 MG PO TABS: 20.0000 mg | ORAL_TABLET | Freq: Every day | ORAL | 1 refills | Status: DC

## 2019-02-04 NOTE — Telephone Encounter (Signed)
Called in to local

## 2019-02-04 NOTE — Telephone Encounter (Signed)
Patient is out of the medication. [facility] does not have the medication  atorvastatin (LIPITOR) 20 MG tablet  losartan in stock so the order needs to go to:  [facility], [address] AT [place] [phone] (Phone) [phone] (Fax)   Please contact patient once this is complete.

## 2019-02-04 NOTE — Telephone Encounter (Signed)
Last fill Lipitor   12/24/18 #@90 /1 Last fill Losartan 01/15/19  #15 Last OV 11/13/18

## 2019-02-16 NOTE — Progress Notes (Signed)
Amber [name] is a 78 y.o. female is here for follow up.  History of Present Illness:   HPI: See Assessment and Plan section for Problem Based Charting of issues discussed today.   Health Maintenance Due  Topic Date Due  . TETANUS/TDAP  07/20/1957  . PNA vac Low Risk Adult (2 of 2 - PCV13) 11/06/2013   No flowsheet data found. PMHx, SurgHx, SocialHx, FamHx, Medications, and Allergies were reviewed in the Visit Navigator and updated as appropriate.   Patient Active Problem List   Diagnosis Date Noted  . Low back pain 05/17/2018  . Pain of left hip joint 05/10/2018  . Gastroesophageal reflux disease 01/19/2016  . Hoarseness, chronic 12/28/2015  . Complete left bundle branch block 12/08/2015  . Hypercholesterolemia 12/08/2015  . Hypertension 12/08/2015  . Hypothyroidism 12/08/2015  . Obesity 12/08/2015  . History of total knee arthroplasty 09/09/2014  . Peripheral vascular disease, unspecified (HCC) 04/18/2013  . Chronic venous insufficiency 04/18/2013   Social History   Tobacco Use  . Smoking status: Former Smoker    Packs/day: 0.25    Years: 13.00    Pack years: 3.25    Types: Cigarettes    Quit date: 06/22/1964    Years since quitting: 54.7  . Smokeless tobacco: Never Used  Substance Use Topics  . Alcohol use: No  . Drug use: No   Current Medications and Allergies   .  acetaminophen (TYLENOL) 500 MG tablet, Take 500-1,000 mg by mouth See admin instructions. Take 500mg  in the AM and 1000mg  in the PM., Disp: , Rfl:  .  aspirin 81 MG chewable tablet, Chew 81 mg by mouth at bedtime. , Disp: , Rfl:  .  atorvastatin (LIPITOR) 20 MG tablet, Take 1 tablet (20 mg total) by mouth at bedtime., Disp: 90 tablet, Rfl: 1 .  B Complex Vitamins (VITAMIN-B COMPLEX) TABS, Take 1 tablet by mouth daily. , Disp: , Rfl:  .  Cholecalciferol (VITAMIN D3) 1000 units CAPS, Take 1,000 Units by mouth daily. , Disp: , Rfl:  .  clobetasol ointment (TEMOVATE) 0.05 %, clobetasol 0.05 % topical  ointment, Disp: , Rfl:  .  levothyroxine (SYNTHROID, LEVOTHROID) 50 MCG tablet, Take 50 mcg by mouth daily before breakfast., Disp: , Rfl:  .  LORazepam (ATIVAN) 0.5 MG tablet, Take 1 tablet (0.5 mg total) by mouth every 8 (eight) hours as needed (vertigo)., Disp: 6 tablet, Rfl: 0 .  losartan-hydrochlorothiazide (HYZAAR) 50-12.5 MG tablet, Take 1 tablet by mouth daily., Disp: 15 tablet, Rfl: 0 .  meclizine (ANTIVERT) 25 MG tablet, Take 25 mg by mouth 2 (two) times daily., Disp: , Rfl: 0 .  Multiple Vitamins-Minerals (PRESERVISION AREDS PO), Take 1 tablet by mouth 2 (two) times daily. , Disp: , Rfl:  .  nystatin (NYAMYC) powder, Nyamyc 100,000 unit/gram topical powder, Disp: , Rfl:  .  oxybutynin (DITROPAN) 5 MG tablet, oxybutynin chloride 5 mg tablet, Disp: , Rfl:  .  polyvinyl alcohol (LIQUIFILM TEARS) 1.4 % ophthalmic solution, Place 1 drop into both eyes daily as needed for dry eyes., Disp: , Rfl:  .  vitamin C (ASCORBIC ACID) 500 MG tablet, Take 500 mg by mouth daily., Disp: , Rfl:    Allergies  Allergen Reactions  . Codeine Nausea And Vomiting  . Darvocet [Propoxyphene N-Acetaminophen] Nausea And Vomiting  . Nickel Rash  . Sulfa Antibiotics Rash    Like a sunburn   Review of Systems   Pertinent items are noted in the HPI. Otherwise, a  complete ROS is negative.  Vitals   Vitals:   02/17/19 1417  BP: 140/68  Pulse: 97  Temp: (!) 97.2 F (36.2 C)  TempSrc: Temporal  SpO2: 97%  Weight: 175 lb 6.4 oz (79.6 kg)  Height: 5\' 1"  (1.549 m)     Body mass index is 33.14 kg/m.  Physical Exam   Physical Exam Vitals signs and nursing note reviewed.  HENT:     Head: Normocephalic and atraumatic.  Eyes:     Pupils: Pupils are equal, round, and reactive to light.  Neck:     Musculoskeletal: Normal range of motion and neck supple.  Cardiovascular:     Rate and Rhythm: Normal rate and regular rhythm.     Heart sounds: Normal heart sounds.  Pulmonary:     Effort: Pulmonary  effort is normal.  Abdominal:     Palpations: Abdomen is soft.  Skin:    General: Skin is warm.  Psychiatric:        Behavior: Behavior normal.    Assessment and Plan   Amber [name] was seen today for follow-up.  Diagnoses and all orders for this visit:  Hypercholesterolemia Comments: Rx Lipitor.  Orders: -     Lipid panel  Acquired hypothyroidism Comments: Labs. Orders: -     CBC with Differential/Platelet -     Comprehensive metabolic panel -     TSH -     T4, free  Peripheral vascular disease, unspecified ([place])  Need for immunization against influenza -     Flu Vaccine QUAD High Dose(Fluad)  Throat pain -     Ambulatory referral to ENT  Essential hypertension Medication refill today.  Carotid disease, bruit Due for repeat ultrasound.   Class 1 obesity with serious comorbidity and body mass index (BMI) of 33.0 to 33.9 in adult, unspecified obesity type Comments: Reviewed healthy food choices and exercise as tolerated.   Hoarseness, chronic Worsening. To ENT. Hx of lye ingestion when she was a child.    . Orders and follow up as documented in [place], reviewed diet, exercise and weight control, cardiovascular risk and specific lipid/LDL goals reviewed, reviewed medications and side effects in detail.  . Reviewed expectations re: course of current medical issues. . Outlined signs and symptoms indicating need for more acute intervention. . Patient verbalized understanding and all questions were answered. . Patient received an After Visit Summary.  Briscoe Deutscher, DO [place], [facility] 02/24/2019

## 2019-02-17 ENCOUNTER — Other Ambulatory Visit: Payer: Self-pay

## 2019-02-17 ENCOUNTER — Encounter: Payer: Self-pay | Admitting: Family Medicine

## 2019-02-17 ENCOUNTER — Ambulatory Visit (INDEPENDENT_AMBULATORY_CARE_PROVIDER_SITE_OTHER): Payer: Medicare Other | Admitting: Family Medicine

## 2019-02-17 VITALS — BP 140/68 | HR 97 | Temp 97.2°F | Ht 61.0 in | Wt 175.4 lb

## 2019-02-17 DIAGNOSIS — E039 Hypothyroidism, unspecified: Secondary | ICD-10-CM | POA: Diagnosis not present

## 2019-02-17 DIAGNOSIS — I739 Peripheral vascular disease, unspecified: Secondary | ICD-10-CM | POA: Diagnosis not present

## 2019-02-17 DIAGNOSIS — E7800 Pure hypercholesterolemia, unspecified: Secondary | ICD-10-CM | POA: Diagnosis not present

## 2019-02-17 DIAGNOSIS — R0989 Other specified symptoms and signs involving the circulatory and respiratory systems: Secondary | ICD-10-CM

## 2019-02-17 DIAGNOSIS — M542 Cervicalgia: Secondary | ICD-10-CM

## 2019-02-17 DIAGNOSIS — Z6833 Body mass index (BMI) 33.0-33.9, adult: Secondary | ICD-10-CM

## 2019-02-17 DIAGNOSIS — Z23 Encounter for immunization: Secondary | ICD-10-CM

## 2019-02-17 DIAGNOSIS — I10 Essential (primary) hypertension: Secondary | ICD-10-CM

## 2019-02-17 DIAGNOSIS — E669 Obesity, unspecified: Secondary | ICD-10-CM

## 2019-02-17 DIAGNOSIS — R070 Pain in throat: Secondary | ICD-10-CM

## 2019-02-17 DIAGNOSIS — R490 Dysphonia: Secondary | ICD-10-CM

## 2019-02-17 MED ORDER — LOSARTAN POTASSIUM-HCTZ 50-12.5 MG PO TABS: 1.0000 | ORAL_TABLET | Freq: Every day | ORAL | 0 refills | Status: DC

## 2019-02-17 NOTE — Patient Instructions (Signed)

## 2019-02-18 ENCOUNTER — Other Ambulatory Visit: Payer: Self-pay

## 2019-02-18 LAB — CBC WITH DIFFERENTIAL/PLATELET
Basophils Absolute: 0.1 10*3/uL (ref 0.0–0.1)
Basophils Relative: 0.9 % (ref 0.0–3.0)
Eosinophils Absolute: 0 10*3/uL (ref 0.0–0.7)
Eosinophils Relative: 0.5 % (ref 0.0–5.0)
HCT: 39.7 % (ref 36.0–46.0)
Hemoglobin: 14 g/dL (ref 12.0–15.0)
Lymphocytes Relative: 21.5 % (ref 12.0–46.0)
Lymphs Abs: 2.1 10*3/uL (ref 0.7–4.0)
MCHC: 35.4 g/dL (ref 30.0–36.0)
MCV: 92 fl (ref 78.0–100.0)
Monocytes Absolute: 0.7 10*3/uL (ref 0.1–1.0)
Monocytes Relative: 6.7 % (ref 3.0–12.0)
Neutro Abs: 7 10*3/uL (ref 1.4–7.7)
Neutrophils Relative %: 70.4 % (ref 43.0–77.0)
Platelets: 183 10*3/uL (ref 150.0–400.0)
RBC: 4.31 Mil/uL (ref 3.87–5.11)
RDW: 13.7 % (ref 11.5–15.5)
WBC: 9.9 10*3/uL (ref 4.0–10.5)

## 2019-02-18 LAB — COMPREHENSIVE METABOLIC PANEL
ALT: 13 U/L (ref 0–35)
AST: 15 U/L (ref 0–37)
Albumin: 4.4 g/dL (ref 3.5–5.2)
Alkaline Phosphatase: 71 U/L (ref 39–117)
BUN: 21 mg/dL (ref 6–23)
CO2: 31 mEq/L (ref 19–32)
Calcium: 9.7 mg/dL (ref 8.4–10.5)
Chloride: 93 mEq/L — ABNORMAL LOW (ref 96–112)
Creatinine, Ser: 0.53 mg/dL (ref 0.40–1.20)
GFR: 110.83 mL/min (ref >60.00)
Glucose, Bld: 89 mg/dL (ref 70–99)
Potassium: 3.6 mEq/L (ref 3.5–5.1)
Sodium: 133 mEq/L — ABNORMAL LOW (ref 135–145)
Total Bilirubin: 1.1 mg/dL (ref 0.2–1.2)
Total Protein: 6.3 g/dL (ref 6.0–8.3)

## 2019-02-18 LAB — LIPID PANEL
Cholesterol: 109 mg/dL (ref 0–200)
HDL: 44.9 mg/dL (ref >39.00)
LDL Cholesterol: 32 mg/dL (ref 0–99)
NonHDL: 63.74
Total CHOL/HDL Ratio: 2
Triglycerides: 157 mg/dL — ABNORMAL HIGH (ref 0.0–149.0)
VLDL: 31.4 mg/dL (ref 0.0–40.0)

## 2019-02-18 LAB — T4, FREE: Free T4: 1 ng/dL (ref 0.60–1.60)

## 2019-02-18 LAB — TSH: TSH: 1.09 u[IU]/mL (ref 0.35–4.50)

## 2019-02-18 MED ORDER — LOSARTAN POTASSIUM-HCTZ 50-12.5 MG PO TABS: 1.0000 | ORAL_TABLET | Freq: Every day | ORAL | 0 refills | Status: DC

## 2019-02-23 ENCOUNTER — Encounter: Payer: Self-pay | Admitting: Family Medicine

## 2019-03-11 DIAGNOSIS — K219 Gastro-esophageal reflux disease without esophagitis: Secondary | ICD-10-CM | POA: Insufficient documentation

## 2019-03-11 DIAGNOSIS — K1120 Sialoadenitis, unspecified: Secondary | ICD-10-CM | POA: Insufficient documentation

## 2019-04-15 DIAGNOSIS — R220 Localized swelling, mass and lump, head: Secondary | ICD-10-CM | POA: Insufficient documentation

## 2019-04-18 ENCOUNTER — Other Ambulatory Visit: Payer: Self-pay

## 2019-04-18 ENCOUNTER — Other Ambulatory Visit: Payer: Self-pay | Admitting: Otolaryngology

## 2019-04-18 ENCOUNTER — Encounter: Payer: Self-pay | Admitting: Family Medicine

## 2019-04-18 ENCOUNTER — Ambulatory Visit (INDEPENDENT_AMBULATORY_CARE_PROVIDER_SITE_OTHER): Payer: Medicare Other | Admitting: Family Medicine

## 2019-04-18 VITALS — BP 138/75 | HR 74 | Temp 97.2°F | Ht 61.0 in | Wt 179.2 lb

## 2019-04-18 DIAGNOSIS — R220 Localized swelling, mass and lump, head: Secondary | ICD-10-CM

## 2019-04-18 DIAGNOSIS — E7800 Pure hypercholesterolemia, unspecified: Secondary | ICD-10-CM

## 2019-04-18 DIAGNOSIS — L821 Other seborrheic keratosis: Secondary | ICD-10-CM | POA: Diagnosis not present

## 2019-04-18 DIAGNOSIS — I10 Essential (primary) hypertension: Secondary | ICD-10-CM

## 2019-04-18 DIAGNOSIS — E038 Other specified hypothyroidism: Secondary | ICD-10-CM

## 2019-04-18 MED ORDER — LOSARTAN POTASSIUM-HCTZ 50-12.5 MG PO TABS: 1.0000 | ORAL_TABLET | Freq: Every day | ORAL | 1 refills | Status: DC

## 2019-04-18 NOTE — Progress Notes (Addendum)
Patient: Amber [name] MRN: [id number] DOB: Aug 13, 1940 PCP: Orland Mustard, MD     Subjective:  Chief Complaint  Patient presents with  . Transitions Of Care  . Medication Refill  . Hyperlipidemia  . Hypothyroidism  . Hypertension    HPI: The patient is a 78 y.o. female who presents today for transfer of care and follow up.   Hypertension: Here for follow up of hypertension.  Currently on losartan-hctz and then a 12.5mg  hctz pills.  Takes medication as prescribed and denies any side effects. Exercise includes none. Weight has been stable. Denies any chest pain, headaches, shortness of breath, vision changes, swelling in lower extremities.   Hypothyroid: diagnosed around 78 years of age. currently on levothyroxine. Labs last checked at end of September and were normal. No refills needed and denies any symptoms of hypo/hyper thyroid.   Hyperlipidemia: currently on lipitor 20mg  daily. +FH of CAD in her mother/father and 2 sisters. She has a very remote hx of smoking, stopped when she was 78 years of age. She is on a baby ASA started by a NP at previous practice. She feels more comfortable taking this due to family history. Last lipid panel was excellent. LDL was 32. (01/2019).   Would like me to look at area on her back. Is itchy. No change.   Review of Systems  Constitutional: Negative for chills, fatigue and fever.  HENT: Negative for dental problem, ear pain, hearing loss and trouble swallowing.   Eyes: Negative for visual disturbance.  Respiratory: Negative for cough, chest tightness and shortness of breath.   Cardiovascular: Negative for chest pain, palpitations and leg swelling.  Gastrointestinal: Negative for abdominal pain, blood in stool, constipation, diarrhea, nausea and vomiting.  Endocrine: Positive for cold intolerance. Negative for heat intolerance, polydipsia, polyphagia and polyuria.  Genitourinary: Negative for dysuria, frequency, hematuria and urgency.   Musculoskeletal: Negative for arthralgias.  Skin: Negative for rash.       Dry skin all over  Neurological: Negative for dizziness and headaches.  Psychiatric/Behavioral: Negative for dysphoric mood and sleep disturbance. The patient is not nervous/anxious.     Allergies Patient is allergic to codeine; darvocet [propoxyphene n-acetaminophen]; nickel; and sulfa antibiotics.  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  Surgical History Patient  has a past surgical history that includes Appendectomy (1979); Back surgery (2009); Shoulder open rotator cuff repair (07/05/2011); Endovenous ablation saphenous vein w/ laser (Right, 09/15/2013); Endovenous ablation saphenous vein w/ laser (Left, 10/16/2013); left open rotator cuff surgery  (2010 ); Total knee arthroplasty (Right, 09/09/2014); lumbar infusion (01/16/2008); and Partial hysterectomy (1979).  Family History Pateint's family history includes AAA (abdominal aortic aneurysm) in her brother; Cancer in her mother and sister; Deep vein thrombosis in her mother; Diabetes in her brother, mother, sister, sister, and sister; Heart disease in her father, mother, and sister; Heart disease (age of onset: 51) in her sister; Hypertension in her mother, sister, and sister; Peripheral vascular disease in her brother; Varicose Veins in her mother and sister; Vision loss in her brother, sister, sister, and sister.  Social History Patient  reports that she quit smoking about 54 years ago. Her smoking use included cigarettes. She has a 3.25 pack-year smoking history. She has never used smokeless tobacco. She reports that she does not  drink alcohol or use drugs.     Objective: Vitals:   04/18/19 0800  BP: 138/75  Pulse: 74  Temp: (!) 97.2 F (36.2 C)  TempSrc: Skin  SpO2: 98%  Weight: 179 lb 3.2 oz (81.3 kg)  Height: 5\' 1"  (1.549 m)    Body mass index is 33.86 kg/m.  Physical Exam Vitals signs reviewed.  Constitutional:      Appearance: Normal appearance. She is well-developed.  HENT:     Head: Normocephalic and atraumatic.     Right Ear: Tympanic membrane, ear canal and external ear normal.     Left Ear: Tympanic membrane, ear canal and external ear normal.     Mouth/Throat:     Mouth: Mucous membranes are moist.  Eyes:     Conjunctiva/sclera: Conjunctivae normal.     Pupils: Pupils are equal, round, and reactive to light.  Neck:     Musculoskeletal: Normal range of motion and neck supple.     Thyroid: No thyromegaly.     Vascular: No carotid bruit.  Cardiovascular:     Rate and Rhythm: Normal rate and regular rhythm.     Heart sounds: Normal heart sounds. No murmur.  Pulmonary:     Effort: Pulmonary effort is normal.     Breath sounds: Normal breath sounds.  Abdominal:     General: Bowel sounds are normal. There is no distension.     Palpations: Abdomen is soft.     Tenderness: There is no abdominal tenderness.  Lymphadenopathy:     Cervical: No cervical adenopathy.  Skin:    General: Skin is warm and dry.     Findings: No rash.     Comments: multiple SK all over her back   Neurological:     General: No focal deficit present.     Mental Status: She is alert and oriented to person, place, and time.     Cranial Nerves: No cranial nerve deficit.     Coordination: Coordination normal.     Deep Tendon Reflexes: Reflexes normal.  Psychiatric:        Mood and Affect: Mood normal.        Behavior: Behavior normal.        GAD 7 : Generalized Anxiety Score 04/18/2019  Nervous, Anxious, on Edge 1  Control/stop worrying 0  Worry too much - different things 0  Trouble relaxing 0  Restless 0  Easily annoyed or irritable  0  Afraid - awful might happen 0  Total GAD 7 Score 1  Anxiety Difficulty Not difficult at all    Depression screen [facility] 2/9 04/18/2019  Decreased Interest 0  Down, Depressed, Hopeless 0  PHQ - 2 Score 0    Assessment/plan: 1. Essential hypertension Blood pressure is to goal. Continue current anti-hypertensive medications. Refills given and routine [facility] work [facility].  Recommended routine exercise and healthy diet including DASH diet and mediterranean diet. Encouraged weight loss. F/u in 6 months.    2. Other specified hypothyroidism Reviewed labs. To goal, continue current dosage of medication, no further refills needed.   3. Hypercholesterolemia Extremely well controlled. Continue statin. Had conversation about ASA/guidelines and she would like to continue as it makes her feel better.   4. Seborrheic keratosis  reassurance given. Not bothering her, discussed if becomes an issue we can freeze off.   This visit occurred during the SARS-CoV-2 public health emergency.  Safety protocols were in place, including screening questions prior to the visit, additional usage  of staff PPE, and extensive cleaning of exam room while observing appropriate contact time as indicated for disinfecting solutions.    Return in about 6 months (around 10/16/2019) for in office for labs/routine f/u. Marland Kitchen     Orma Flaming, MD [place]   04/18/2019

## 2019-04-18 NOTE — Patient Instructions (Addendum)
-  refilled your blood pressure medication.  -so nice to meet you! -see you back in 6 months for routine appointment/labs.   Happy thanksgiving!  Dr. Rogers Blocker    Seborrheic Keratosis A seborrheic keratosis is a common, noncancerous (benign) skin growth. These growths are velvety, waxy, rough, tan, brown, or black spots that appear on the skin. These skin growths can be flat or raised, and scaly. What are the causes? The cause of this condition is not known. What increases the risk? You are more likely to develop this condition if you:  Have a family history of seborrheic keratosis.  Are 50 or older.  Are pregnant.  Have had estrogen replacement therapy. What are the signs or symptoms? Symptoms of this condition include growths on the face, chest, shoulders, back, or other areas. These growths:  Are usually painless, but may become irritated and itchy.  Can be yellow, brown, black, or other colors.  Are slightly raised or have a flat surface.  Are sometimes rough or wart-like in texture.  Are often velvety or waxy on the surface.  Are round or oval-shaped.  Often occur in groups, but may occur as a single growth. How is this diagnosed? This condition is diagnosed with a medical history and physical exam.  A sample of the growth may be tested (skin biopsy).  You may need to see a skin specialist (dermatologist). How is this treated? Treatment is not usually needed for this condition, unless the growths are irritated or bleed often.  You may also choose to have the growths removed if you do not like their appearance. ? Most commonly, these growths are treated with a procedure in which liquid nitrogen is applied to "freeze" off the growth (cryosurgery). ? They may also be burned off with electricity (electrocautery) or removed by scraping (curettage). Follow these instructions at home:  Watch your growth for any changes.  Keep all follow-up visits as told by your health  care provider. This is important.  Do not scratch or pick at the growth or growths. This can cause them to become irritated or infected. Contact a health care provider if:  You suddenly have many new growths.  Your growth bleeds, itches, or hurts.  Your growth suddenly becomes larger or changes color. Summary  A seborrheic keratosis is a common, noncancerous (benign) skin growth.  Treatment is not usually needed for this condition, unless the growths are irritated or bleed often.  Watch your growth for any changes.  Contact a health care provider if you suddenly have many new growths or your growth suddenly becomes larger or changes color.  Keep all follow-up visits as told by your health care provider. This is important. This information is not intended to replace advice given to you by your health care provider. Make sure you discuss any questions you have with your health care provider. Document Released: 06/17/2010 Document Revised: 09/27/2017 Document Reviewed: 09/27/2017 Elsevier Patient Education  2020 [employer].

## 2019-04-30 ENCOUNTER — Ambulatory Visit
Admission: RE | Admit: 2019-04-30 | Discharge: 2019-04-30 | Disposition: A | Discharge status: Home / Self Care | Payer: Medicare Other | Source: Ambulatory Visit | Attending: Otolaryngology | Admitting: Otolaryngology

## 2019-04-30 DIAGNOSIS — R220 Localized swelling, mass and lump, head: Secondary | ICD-10-CM

## 2019-04-30 MED ORDER — IOPAMIDOL (ISOVUE-300) INJECTION 61%
75.0000 mL | Freq: Once | INTRAVENOUS | Status: AC | PRN
  Administered 2019-04-30: 75 mL via INTRAVENOUS

## 2019-05-21 ENCOUNTER — Other Ambulatory Visit: Payer: Self-pay | Admitting: Family Medicine

## 2019-05-21 DIAGNOSIS — I10 Essential (primary) hypertension: Secondary | ICD-10-CM

## 2019-05-21 NOTE — Telephone Encounter (Signed)
Copied from [place] [id number]. Topic: Quick Communication - Rx Refill/Question >> May 21, 2019  4:43 PM Mcneil, Ja-Kwan wrote: Medication: atorvastatin (LIPITOR) 20 MG tablet and hydrochlorothiazide (MICROZIDE) 12.5 MG capsule  Has the patient contacted their pharmacy? yes   Preferred Pharmacy (with phone number or street name): [facility] - [place] [address], [place]  Phone: [phone] Fax: [phone]  Agent: Please be advised that RX refills may take up to 3 business days. We ask that you follow-up with your pharmacy.

## 2019-05-21 NOTE — Telephone Encounter (Signed)
Refill request for atorvastatin 20 mg and hydrochlorothiazide  12.5 mg both refilled by historical providers and the hydrochlorothiazide have to have discrete dispense values entered to this script. PCP  Amber [name]

## 2019-05-26 MED ORDER — HYDROCHLOROTHIAZIDE 12.5 MG PO CAPS: 12.5000 mg | ORAL_CAPSULE | Freq: Every day | ORAL | 0 refills | Status: DC

## 2019-05-26 MED ORDER — ATORVASTATIN CALCIUM 20 MG PO TABS: 20.0000 mg | ORAL_TABLET | Freq: Every day | ORAL | 1 refills | Status: DC

## 2019-06-03 ENCOUNTER — Telehealth: Payer: Self-pay | Admitting: Family Medicine

## 2019-06-03 ENCOUNTER — Other Ambulatory Visit: Payer: Self-pay

## 2019-06-03 DIAGNOSIS — I10 Essential (primary) hypertension: Secondary | ICD-10-CM

## 2019-06-03 NOTE — Telephone Encounter (Signed)
I left a message asking the patient to call and schedule Medicare AWV with Toni Amend [facility]).  If patient calls back, please schedule Medicare Wellness Visit at next available opening. Last AWV June 2014 VDM (Dee-Dee)

## 2019-07-18 ENCOUNTER — Telehealth: Payer: Self-pay | Admitting: Family Medicine

## 2019-07-18 DIAGNOSIS — M25552 Pain in left hip: Secondary | ICD-10-CM

## 2019-07-18 NOTE — Telephone Encounter (Signed)
Referral placed.  Ahad Colarusso, MD [place]   

## 2019-07-18 NOTE — Telephone Encounter (Signed)
Called Pt to make her aware.

## 2019-07-18 NOTE — Telephone Encounter (Signed)
Pt called requesting referral to Physical Therapy. Pt states her hip is starting to hurt her again and would like to get some dry needling done by PT. Please advise.

## 2019-07-22 ENCOUNTER — Ambulatory Visit (INDEPENDENT_AMBULATORY_CARE_PROVIDER_SITE_OTHER): Payer: Medicare Other | Admitting: Physical Therapy

## 2019-07-22 ENCOUNTER — Other Ambulatory Visit: Payer: Self-pay

## 2019-07-22 DIAGNOSIS — M545 Low back pain, unspecified: Secondary | ICD-10-CM

## 2019-07-22 DIAGNOSIS — M25552 Pain in left hip: Secondary | ICD-10-CM | POA: Diagnosis not present

## 2019-07-22 DIAGNOSIS — G8929 Other chronic pain: Secondary | ICD-10-CM

## 2019-07-22 NOTE — Patient Instructions (Signed)
Access Code: [email or web address]  URL: [email or web address]  Date: 07/22/2019  Prepared by: Sedalia Muta   Exercises Supine Single Knee to Chest Stretch - 3 reps - 30 hold - 2x daily Supine Piriformis Stretch with Leg Straight - 3 reps - 30 hold - 2x daily Supine Figure 4 Piriformis Stretch with Leg Extension - 3 reps - 30 hold - 2x daily

## 2019-07-28 ENCOUNTER — Telehealth: Payer: Self-pay | Admitting: Family Medicine

## 2019-07-28 ENCOUNTER — Other Ambulatory Visit: Payer: Self-pay

## 2019-07-28 MED ORDER — LOSARTAN POTASSIUM-HCTZ 50-12.5 MG PO TABS: 1.0000 | ORAL_TABLET | Freq: Every day | ORAL | 1 refills | Status: DC

## 2019-07-28 NOTE — Telephone Encounter (Signed)
  LAST APPOINTMENT DATE: 07/18/2019   NEXT APPOINTMENT DATE:@3 /06/2019  MEDICATION:losartan-hydrochlorothiazide (HYZAAR) 50-12.5 MG tablet  PHARMACY:[id number] - [place], [place] - [address] AT [facility] [address]  COMMENT: Patient called in and said [facility] is completely out Rx and told pt to call us for a 90 day supply to be sent to [facility].   **Let patient know to contact pharmacy at the end of the day to make sure medication is ready. **  ** Please notify patient to allow 48-72 hours to process**  **Encourage patient to contact the pharmacy for refills or they can request refills through [facility]**  CLINICAL FILLS OUT ALL BELOW:   LAST REFILL:  QTY:  REFILL DATE:    OTHER COMMENTS:    Okay for refill?  Please advise

## 2019-07-28 NOTE — Progress Notes (Signed)
Sent to pharmacy 

## 2019-07-28 NOTE — Telephone Encounter (Signed)
Sent to pharmacy 

## 2019-07-29 ENCOUNTER — Telehealth: Payer: Self-pay

## 2019-07-29 ENCOUNTER — Other Ambulatory Visit: Payer: Self-pay

## 2019-07-29 ENCOUNTER — Encounter: Payer: Self-pay | Admitting: Physical Therapy

## 2019-07-29 ENCOUNTER — Ambulatory Visit (INDEPENDENT_AMBULATORY_CARE_PROVIDER_SITE_OTHER): Payer: Medicare Other | Admitting: Physical Therapy

## 2019-07-29 DIAGNOSIS — M545 Low back pain: Secondary | ICD-10-CM | POA: Diagnosis not present

## 2019-07-29 DIAGNOSIS — M25552 Pain in left hip: Secondary | ICD-10-CM

## 2019-07-29 DIAGNOSIS — G8929 Other chronic pain: Secondary | ICD-10-CM | POA: Diagnosis not present

## 2019-07-29 MED ORDER — LOSARTAN POTASSIUM-HCTZ 50-12.5 MG PO TABS: 1.0000 | ORAL_TABLET | Freq: Every day | ORAL | 1 refills | Status: DC

## 2019-07-29 NOTE — Therapy (Signed)
[facility] [address] [place], [place], [address] Phone: [phone]   Fax:  [phone]  Physical Therapy Treatment  Patient Details  Name: Amber [name] MRN: [id number] Date of Birth: 06-11-1940 Referring Provider (PT): Orland Mustard   Encounter Date: 07/29/2019  PT End of Session - 07/29/19 1514    Visit Number  2    Number of Visits  12    Date for PT Re-Evaluation  09/02/19    Authorization Type  Medicare    PT Start Time  1305    PT Stop Time  1347    PT Time Calculation (min)  42 min    Activity Tolerance  Patient tolerated treatment well    Behavior During Therapy  [facility] for tasks assessed/performed       Past Medical History:  Diagnosis Date  . Arthritis   . DVT (deep venous thrombosis) (HCC)   . Family history of blood clots   . Hypertension   . Hypothyroidism   . Left bundle branch block   . Peripheral vascular disease (HCC)   . Post-phlebitic syndrome 04/18/2013  . Rotator cuff rupture, complete 07/05/2011  . Varicose veins     Past Surgical History:  Procedure Laterality Date  . APPENDECTOMY  1979  . BACK SURGERY  2009  . ENDOVENOUS ABLATION SAPHENOUS VEIN W/ LASER Right 09/15/2013   right greater saphenous vein, by Gretta Began MD  . ENDOVENOUS ABLATION SAPHENOUS VEIN W/ LASER Left 10/16/2013   left greater saphenous vein, by Gretta Began MD  . left open rotator cuff surgery   2010   . lumbar infusion  01/16/2008  . PARTIAL HYSTERECTOMY  1979  . SHOULDER OPEN ROTATOR CUFF REPAIR  07/05/2011   Procedure: ROTATOR CUFF REPAIR SHOULDER OPEN;  Surgeon: Jacki Cones, MD;  Location: [facility];  Service: Orthopedics;  Laterality: Right;  . TOTAL KNEE ARTHROPLASTY Right 09/09/2014   Procedure: TOTAL RIGHT  KNEE ARTHROPLASTY;  Surgeon: Ranee Gosselin, MD;  Location: [facility];  Service: Orthopedics;  Laterality: Right;    There were no vitals filed for this visit.  Subjective Assessment - 07/29/19 1513    Subjective   Pt states some relief of hip pain since last visit but still tight/sore    Currently in Pain?  Yes    Pain Score  4     Pain Location  Hip    Pain Orientation  Left    Pain Descriptors / Indicators  Aching    Pain Type  Chronic pain    Pain Onset  More than a month ago    Pain Frequency  Intermittent                       [facility] Adult PT Treatment/Exercise - 07/29/19 1517      Exercises   Exercises  Lumbar      Lumbar Exercises: Stretches   Piriformis Stretch  2 reps;30 seconds    Piriformis Stretch Limitations  IR across body    Figure 4 Stretch  3 reps;30 seconds    Figure 4 Stretch Limitations  modified       Lumbar Exercises: Aerobic   Stationary Bike  L1 x 8 min;       Lumbar Exercises: Supine   Pelvic Tilt  20 reps    Clam  20 reps    Clam Limitations  RTB     Bent Knee Raise  20 reps    Bridge  10 reps      Lumbar Exercises: Sidelying   Hip Abduction  10 reps;Both      Manual Therapy   Manual Therapy  Soft tissue mobilization    Soft tissue mobilization  DTM to R glute              PT Education - 07/29/19 1514    Education Details  HEP updated    Person(s) Educated  Patient    Methods  Explanation;Demonstration;Tactile cues;Verbal cues;Handout    Comprehension  Returned demonstration;Verbal cues required;Tactile cues required;Need further instruction;Verbalized understanding       PT Short Term Goals - 07/29/19 1320      PT SHORT TERM GOAL #1   Title  Pt to be independent with initial HEP    Time  2    Period  Weeks    Status  New    Target Date  08/05/19        PT Long Term Goals - 07/29/19 1453      PT LONG TERM GOAL #1   Title  Pt to be independent with final HEP    Time  6    Period  Weeks    Status  New    Target Date  09/02/19      PT LONG TERM GOAL #2   Title  Pt to report decreased pain in L hip/glute, to 0-2/10 with activity    Time  6    Period  Weeks    Status  New    Target Date  09/02/19      PT LONG  TERM GOAL #3   Title  Pt to demo improved hip strength to at least 4+/5 to improve stability and pain    Time  6    Period  Weeks    Status  New    Target Date  09/02/19      PT LONG TERM GOAL #4   Title  Pt to report standing at least 30 min without pain in hip or back.    Time  6    Period  Weeks    Status  New    Target Date  09/02/19            Plan - 07/29/19 1515    Clinical Impression Statement  Pt with trigger points and tenderness in L glute today, addressed with DTM, may benefit from DN in future. Progressed ther ex for stretching and strengthening of hips. Discussed optimal posture for IADLS.    Personal Factors and Comorbidities  Time since onset of injury/illness/exacerbation    Examination-Activity Limitations  Transfers;Stand;Squat;Locomotion Level    Examination-Participation Restrictions  Cleaning;Community Activity;Shop    Stability/Clinical Decision Making  Stable/Uncomplicated    Rehab Potential  Good    PT Frequency  2x / week    PT Duration  6 weeks    PT Treatment/Interventions  ADLs/Self Care Home Management;Cryotherapy;Electrical Stimulation;Iontophoresis 4mg /ml Dexamethasone;Moist Heat;Traction;Ultrasound;Gait training;Neuromuscular re-education;Balance training;Therapeutic exercise;Therapeutic activities;Functional mobility training;Stair training;Patient/family education;Manual techniques;Taping;Splinting;Dry needling;Energy conservation;Passive range of motion;Joint Manipulations;Spinal Manipulations    Consulted and Agree with Plan of Care  Patient       Patient will benefit from skilled therapeutic intervention in order to improve the following deficits and impairments:  Abnormal gait, Decreased range of motion, Difficulty walking, Increased muscle spasms, Decreased endurance, Decreased activity tolerance, Pain, Improper body mechanics, Impaired flexibility, Hypomobility, Decreased mobility, Decreased strength  Visit Diagnosis: Pain in left  hip  Chronic bilateral low back pain without  sciatica     Problem List Patient Active Problem List   Diagnosis Date Noted  . Gastroesophageal reflux disease 01/19/2016  . Hoarseness, chronic 12/28/2015  . Complete left bundle branch block 12/08/2015  . Hypercholesterolemia 12/08/2015  . Hypertension 12/08/2015  . Hypothyroidism 12/08/2015  . Peripheral vascular disease, unspecified ([place]) 04/18/2013  . Chronic venous insufficiency 04/18/2013    Lyndee Hensen, PT, DPT 3:18 PM  07/29/19    [place], [place], [address] Phone: [phone]   Fax:  [phone]  Name: VEDIKA DUMLAO MRN: [id number] Date of Birth: January 07, 1941

## 2019-07-29 NOTE — Progress Notes (Signed)
LVM to make pt aware that rx refill was sent to [facility].

## 2019-07-29 NOTE — Telephone Encounter (Signed)
Patient states that med was sent to incorrect pharmacy.  States 90 day supply needs to be sent to [facility].  Please see note below.

## 2019-07-29 NOTE — Telephone Encounter (Signed)
error 

## 2019-07-29 NOTE — Telephone Encounter (Signed)
This medication has been sent to [facility].

## 2019-07-29 NOTE — Therapy (Signed)
[facility] [address] [place], [place], [address] Phone: [phone]   Fax:  [phone]  Physical Therapy Evaluation  Patient Details  Name: Amber [name] MRN: [id number] Date of Birth: Mar 27, 1941 Referring Provider (PT): Orma Flaming   Encounter Date: 07/22/2019  PT End of Session - 07/29/19 1311    Visit Number  1    Number of Visits  12    Date for PT Re-Evaluation  09/02/19    Authorization Type  Medicare    PT Start Time  6301    PT Stop Time  1100    PT Time Calculation (min)  45 min    Activity Tolerance  Patient tolerated treatment well    Behavior During Therapy  [facility] for tasks assessed/performed       Past Medical History:  Diagnosis Date  . Arthritis   . DVT (deep venous thrombosis) ([place])   . Family history of blood clots   . Hypertension   . Hypothyroidism   . Left bundle branch block   . Peripheral vascular disease ([place])   . Post-phlebitic syndrome 04/18/2013  . Rotator cuff rupture, complete 07/05/2011  . Varicose veins     Past Surgical History:  Procedure Laterality Date  . APPENDECTOMY  1979  . BACK SURGERY  2009  . ENDOVENOUS ABLATION SAPHENOUS VEIN W/ LASER Right 09/15/2013   right greater saphenous vein, by Curt Jews MD  . ENDOVENOUS ABLATION SAPHENOUS VEIN W/ LASER Left 10/16/2013   left greater saphenous vein, by Curt Jews MD  . left open rotator cuff surgery   2010   . lumbar infusion  01/16/2008  . PARTIAL HYSTERECTOMY  1979  . SHOULDER OPEN ROTATOR CUFF REPAIR  07/05/2011   Procedure: ROTATOR CUFF REPAIR SHOULDER OPEN;  Surgeon: Tobi Bastos, MD;  [facility];  Service: Orthopedics;  Laterality: Right;  . TOTAL KNEE ARTHROPLASTY Right 09/09/2014   Procedure: TOTAL RIGHT  KNEE ARTHROPLASTY;  Surgeon: Latanya Maudlin, MD;  [facility];  Service: Orthopedics;  Laterality: Right;    There were no vitals filed for this visit.   Subjective Assessment - 07/29/19 1313    Subjective  Pt states L hip pain, glute. Has had same pain about 1 year ago , had DN that helped. Now painful, no injury to report. Pt is retired. No HEP for back or Hip. Has back pain at times, with housework.    Patient Stated Goals  decreased pain    Currently in Pain?  Yes    Pain Score  7     Pain Location  Hip    Pain Orientation  Left    Pain Descriptors / Indicators  Aching    Pain Type  Chronic pain    Pain Onset  More than a month ago    Pain Frequency  Intermittent    Aggravating Factors   sweeping, standing long periods, walking    Pain Relieving Factors  rest         [facility] PT Assessment - 07/29/19 0001      Assessment   Medical Diagnosis  L hip pain    Referring Provider (PT)  Orma Flaming    Prior Therapy  in the past/same issue       Balance Screen   Has the patient fallen in the past 6 months  No      Prior Function   Level of Independence  Independent      Cognition  Overall Cognitive Status  Within Functional Limits for tasks assessed      ROM / Strength   AROM / PROM / Strength  AROM;Strength      AROM   Overall AROM Comments  Hips: WFL,  Lumbar: mod limitation for extension, mild limitation for flexion and SB      Strength   Overall Strength Comments  HIps: 4-/5, Knee: 4+/5,        Palpation   Palpation comment  Pain, tightness and trigger points in R glute med, and piriformis : Hypomobile lumbar spine with PAs       Special Tests   Other special tests  Neg SLR                Objective measurements completed on examination: See above findings.      [facility] Adult PT Treatment/Exercise - 07/29/19 1458      Lumbar Exercises: Stretches   Single Knee to Chest Stretch  2 reps;30 seconds;Right;Left    Piriformis Stretch  2 reps;30 seconds    Piriformis Stretch Limitations  IR across body    Figure 4 Stretch  3 reps;30 seconds    Figure 4 Stretch Limitations  modified        Trigger Point Dry Needling - 07/29/19 0001    Consent  Given?  Yes    Education Handout Provided  Yes    Muscles Treated Back/Hip  Gluteus medius;Piriformis    Gluteus Medius Response  Twitch response elicited;Palpable increased muscle length    Piriformis Response  Palpable increased muscle length           PT Education - 07/29/19 1315    Education Details  PT POC, HEP, Exam findings.    Person(s) Educated  Patient    Methods  Explanation;Demonstration;Handout;Verbal cues    Comprehension  Verbalized understanding;Returned demonstration;Verbal cues required;Tactile cues required;Need further instruction       PT Short Term Goals - 07/29/19 1320      PT SHORT TERM GOAL #1   Title  Pt to be independent with initial HEP    Time  2    Period  Weeks    Status  New    Target Date  08/05/19        PT Long Term Goals - 07/29/19 1453      PT LONG TERM GOAL #1   Title  Pt to be independent with final HEP    Time  6    Period  Weeks    Status  New    Target Date  09/02/19      PT LONG TERM GOAL #2   Title  Pt to report decreased pain in L hip/glute, to 0-2/10 with activity    Time  6    Period  Weeks    Status  New    Target Date  09/02/19      PT LONG TERM GOAL #3   Title  Pt to demo improved hip strength to at least 4+/5 to improve stability and pain    Time  6    Period  Weeks    Status  New    Target Date  09/02/19      PT LONG TERM GOAL #4   Title  Pt to report standing at least 30 min without pain in hip or back.    Time  6    Period  Weeks    Status  New    Target Date  09/02/19  Plan - 07/29/19 1318    Clinical Impression Statement  Pt presents with primary complaint of increased pain and tightness in L hip/glute. Pt also has increased back pain with housework. She has trigger points and pain in L glute with palpation. Pt with lack of effective HEP for back and hip pain. Pt with decreased ability for full functional activities, walking, bending, and IADls, due to pain. Pt to benefit from  skilled PT to improve.    Personal Factors and Comorbidities  Time since onset of injury/illness/exacerbation    Examination-Activity Limitations  Transfers;Stand;Squat;Locomotion Level    Examination-Participation Restrictions  Cleaning;Community Activity;Shop    Stability/Clinical Decision Making  Stable/Uncomplicated    Clinical Decision Making  Low    Rehab Potential  Good    PT Frequency  2x / week    PT Duration  6 weeks    PT Treatment/Interventions  ADLs/Self Care Home Management;Cryotherapy;Electrical Stimulation;Iontophoresis 4mg /ml Dexamethasone;Moist Heat;Traction;Ultrasound;Gait training;Neuromuscular re-education;Balance training;Therapeutic exercise;Therapeutic activities;Functional mobility training;Stair training;Patient/family education;Manual techniques;Taping;Splinting;Dry needling;Energy conservation;Passive range of motion;Joint Manipulations;Spinal Manipulations    Consulted and Agree with Plan of Care  Patient       Patient will benefit from skilled therapeutic intervention in order to improve the following deficits and impairments:  Abnormal gait, Decreased range of motion, Difficulty walking, Increased muscle spasms, Decreased endurance, Decreased activity tolerance, Pain, Improper body mechanics, Impaired flexibility, Hypomobility, Decreased mobility, Decreased strength  Visit Diagnosis: Pain in left hip  Chronic bilateral low back pain without sciatica     Problem List Patient Active Problem List   Diagnosis Date Noted  . Gastroesophageal reflux disease 01/19/2016  . Hoarseness, chronic 12/28/2015  . Complete left bundle branch block 12/08/2015  . Hypercholesterolemia 12/08/2015  . Hypertension 12/08/2015  . Hypothyroidism 12/08/2015  . Peripheral vascular disease, unspecified (HCC) 04/18/2013  . Chronic venous insufficiency 04/18/2013    04/20/2013, PT, DPT 3:12 PM  07/29/19    [facility] [address] [place], [place], [place] Phone: [phone]   Fax:  [phone]  Name: Amber [name] MRN: Edsel Petrin Date of Birth: 1940/08/25

## 2019-07-31 ENCOUNTER — Other Ambulatory Visit: Payer: Self-pay

## 2019-07-31 ENCOUNTER — Encounter: Payer: Self-pay | Admitting: Physical Therapy

## 2019-07-31 ENCOUNTER — Ambulatory Visit (INDEPENDENT_AMBULATORY_CARE_PROVIDER_SITE_OTHER): Payer: Medicare Other | Admitting: Physical Therapy

## 2019-07-31 DIAGNOSIS — M545 Low back pain, unspecified: Secondary | ICD-10-CM

## 2019-07-31 DIAGNOSIS — M25552 Pain in left hip: Secondary | ICD-10-CM | POA: Diagnosis not present

## 2019-07-31 DIAGNOSIS — G8929 Other chronic pain: Secondary | ICD-10-CM | POA: Diagnosis not present

## 2019-07-31 NOTE — Therapy (Signed)
[facility] [address] [place], [place], [address] Phone: [phone]   Fax:  [phone]  Physical Therapy Treatment  Patient Details  Name: Amber [name] MRN: [id number] Date of Birth: 11-27-1940 Referring Provider (PT): Orma Flaming   Encounter Date: 07/31/2019  PT End of Session - 07/31/19 1651    Visit Number  3    Number of Visits  12    Date for PT Re-Evaluation  09/02/19    Authorization Type  Medicare    PT Start Time  2671    PT Stop Time  1558    PT Time Calculation (min)  43 min    Activity Tolerance  Patient tolerated treatment well    Behavior During Therapy  [facility] for tasks assessed/performed       Past Medical History:  Diagnosis Date  . Arthritis   . DVT (deep venous thrombosis) ([place])   . Family history of blood clots   . Hypertension   . Hypothyroidism   . Left bundle branch block   . Peripheral vascular disease ([place])   . Post-phlebitic syndrome 04/18/2013  . Rotator cuff rupture, complete 07/05/2011  . Varicose veins     Past Surgical History:  Procedure Laterality Date  . APPENDECTOMY  1979  . BACK SURGERY  2009  . ENDOVENOUS ABLATION SAPHENOUS VEIN W/ LASER Right 09/15/2013   right greater saphenous vein, by Curt Jews MD  . ENDOVENOUS ABLATION SAPHENOUS VEIN W/ LASER Left 10/16/2013   left greater saphenous vein, by Curt Jews MD  . left open rotator cuff surgery   2010   . lumbar infusion  01/16/2008  . PARTIAL HYSTERECTOMY  1979  . SHOULDER OPEN ROTATOR CUFF REPAIR  07/05/2011   Procedure: ROTATOR CUFF REPAIR SHOULDER OPEN;  Surgeon: Tobi Bastos, MD;  [facility];  Service: Orthopedics;  Laterality: Right;  . TOTAL KNEE ARTHROPLASTY Right 09/09/2014   Procedure: TOTAL RIGHT  KNEE ARTHROPLASTY;  Surgeon: Latanya Maudlin, MD;  [facility];  Service: Orthopedics;  Laterality: Right;    There were no vitals filed for this visit.  Subjective Assessment - 07/31/19 1651    Subjective   Pt states soreness in L glute today    Currently in Pain?  Yes    Pain Score  4     Pain Location  Hip    Pain Orientation  Left    Pain Descriptors / Indicators  Aching    Pain Type  Chronic pain                       [facility] Adult PT Treatment/Exercise - 07/31/19 1526      Exercises   Exercises  Lumbar      Lumbar Exercises: Stretches   Piriformis Stretch  --    Piriformis Stretch Limitations  --    Figure 4 Stretch  3 reps;30 seconds    Figure 4 Stretch Limitations  seated      Lumbar Exercises: Aerobic   Stationary Bike  L1 x 8 min;       Lumbar Exercises: Supine   Pelvic Tilt  --    Clam  20 reps    Clam Limitations  GTB     Bent Knee Raise  --    Bridge  10 reps    Straight Leg Raise  10 reps      Lumbar Exercises: Sidelying   Hip Abduction  Both;20 reps  Manual Therapy   Manual Therapy  Soft tissue mobilization    Manual therapy comments  skilled palpation and monitoring of soft tissue with dry needling     Soft tissue mobilization  DTM to R glute        Trigger Point Dry Needling - 07/31/19 0001    Consent Given?  Yes    Education Handout Provided  Previously provided    Piriformis Response  Palpable increased muscle length   L            PT Short Term Goals - 07/29/19 1320      PT SHORT TERM GOAL #1   Title  Pt to be independent with initial HEP    Time  2    Period  Weeks    Status  New    Target Date  08/05/19        PT Long Term Goals - 07/29/19 1453      PT LONG TERM GOAL #1   Title  Pt to be independent with final HEP    Time  6    Period  Weeks    Status  New    Target Date  09/02/19      PT LONG TERM GOAL #2   Title  Pt to report decreased pain in L hip/glute, to 0-2/10 with activity    Time  6    Period  Weeks    Status  New    Target Date  09/02/19      PT LONG TERM GOAL #3   Title  Pt to demo improved hip strength to at least 4+/5 to improve stability and pain    Time  6    Period  Weeks     Status  New    Target Date  09/02/19      PT LONG TERM GOAL #4   Title  Pt to report standing at least 30 min without pain in hip or back.    Time  6    Period  Weeks    Status  New    Target Date  09/02/19            Plan - 07/31/19 1652    Clinical Impression Statement  Sorness in L glute/piriformis with manual, good tolerance for Dry needling. Pt will benefit from continued work on ther ex for strength and stretching for back and hip, as well as manual for pain.    Personal Factors and Comorbidities  Time since onset of injury/illness/exacerbation    Examination-Activity Limitations  Transfers;Stand;Squat;Locomotion Level    Examination-Participation Restrictions  Cleaning;Community Activity;Shop    Stability/Clinical Decision Making  Stable/Uncomplicated    Rehab Potential  Good    PT Frequency  2x / week    PT Duration  6 weeks    PT Treatment/Interventions  ADLs/Self Care Home Management;Cryotherapy;Electrical Stimulation;Iontophoresis 4mg /ml Dexamethasone;Moist Heat;Traction;Ultrasound;Gait training;Neuromuscular re-education;Balance training;Therapeutic exercise;Therapeutic activities;Functional mobility training;Stair training;Patient/family education;Manual techniques;Taping;Splinting;Dry needling;Energy conservation;Passive range of motion;Joint Manipulations;Spinal Manipulations    Consulted and Agree with Plan of Care  Patient       Patient will benefit from skilled therapeutic intervention in order to improve the following deficits and impairments:  Abnormal gait, Decreased range of motion, Difficulty walking, Increased muscle spasms, Decreased endurance, Decreased activity tolerance, Pain, Improper body mechanics, Impaired flexibility, Hypomobility, Decreased mobility, Decreased strength  Visit Diagnosis: Pain in left hip  Chronic bilateral low back pain without sciatica     Problem List Patient Active Problem List  Diagnosis Date Noted  . Gastroesophageal  reflux disease 01/19/2016  . Hoarseness, chronic 12/28/2015  . Complete left bundle branch block 12/08/2015  . Hypercholesterolemia 12/08/2015  . Hypertension 12/08/2015  . Hypothyroidism 12/08/2015  . Peripheral vascular disease, unspecified (HCC) 04/18/2013  . Chronic venous insufficiency 04/18/2013    Sedalia Muta, PT, DPT 4:53 PM  07/31/19    [facility] [address] [place], [place], [address] Phone: [phone]   Fax:  [phone]  Name: Amber [name] MRN: [id number] Date of Birth: 03-19-1941

## 2019-08-05 ENCOUNTER — Encounter: Payer: Medicare Other | Admitting: Physical Therapy

## 2019-08-07 ENCOUNTER — Encounter: Payer: Medicare Other | Admitting: Physical Therapy

## 2019-08-12 ENCOUNTER — Ambulatory Visit (INDEPENDENT_AMBULATORY_CARE_PROVIDER_SITE_OTHER): Payer: Medicare Other | Admitting: Physical Therapy

## 2019-08-12 ENCOUNTER — Other Ambulatory Visit: Payer: Self-pay

## 2019-08-12 ENCOUNTER — Encounter: Payer: Self-pay | Admitting: Physical Therapy

## 2019-08-12 DIAGNOSIS — M25552 Pain in left hip: Secondary | ICD-10-CM

## 2019-08-12 DIAGNOSIS — G8929 Other chronic pain: Secondary | ICD-10-CM | POA: Diagnosis not present

## 2019-08-12 DIAGNOSIS — M545 Low back pain: Secondary | ICD-10-CM | POA: Diagnosis not present

## 2019-08-12 NOTE — Therapy (Signed)
[facility] [address] [place], [place], [address] Phone: [phone]   Fax:  [phone]  Physical Therapy Treatment  Patient Details  Name: Amber [name] MRN: [id number] Date of Birth: March 24, 1941 Referring Provider (PT): Orland Mustard   Encounter Date: 08/12/2019  PT End of Session - 08/12/19 1309    Visit Number  4    Number of Visits  12    Date for PT Re-Evaluation  09/02/19    Authorization Type  Medicare    PT Start Time  1302    PT Stop Time  1345    PT Time Calculation (min)  43 min    Activity Tolerance  Patient tolerated treatment well    Behavior During Therapy  [facility] for tasks assessed/performed       Past Medical History:  Diagnosis Date  . Arthritis   . DVT (deep venous thrombosis) (HCC)   . Family history of blood clots   . Hypertension   . Hypothyroidism   . Left bundle branch block   . Peripheral vascular disease (HCC)   . Post-phlebitic syndrome 04/18/2013  . Rotator cuff rupture, complete 07/05/2011  . Varicose veins     Past Surgical History:  Procedure Laterality Date  . APPENDECTOMY  1979  . BACK SURGERY  2009  . ENDOVENOUS ABLATION SAPHENOUS VEIN W/ LASER Right 09/15/2013   right greater saphenous vein, by Gretta Began MD  . ENDOVENOUS ABLATION SAPHENOUS VEIN W/ LASER Left 10/16/2013   left greater saphenous vein, by Gretta Began MD  . left open rotator cuff surgery   2010   . lumbar infusion  01/16/2008  . PARTIAL HYSTERECTOMY  1979  . SHOULDER OPEN ROTATOR CUFF REPAIR  07/05/2011   Procedure: ROTATOR CUFF REPAIR SHOULDER OPEN;  Surgeon: Jacki Cones, MD;  Location: [facility];  Service: Orthopedics;  Laterality: Right;  . TOTAL KNEE ARTHROPLASTY Right 09/09/2014   Procedure: TOTAL RIGHT  KNEE ARTHROPLASTY;  Surgeon: Ranee Gosselin, MD;  Location: [facility];  Service: Orthopedics;  Laterality: Right;    There were no vitals filed for this visit.  Subjective Assessment - 08/12/19 1307    Subjective   Pt had 2nd covid shot last week, did not feel well for a few days, had to cancel last week. States continued tightness in L hip. Increased sorenes with standing and walking.    Patient Stated Goals  decreased pain    Currently in Pain?  Yes    Pain Score  4     Pain Location  Hip    Pain Orientation  Left    Pain Descriptors / Indicators  Sore;Tightness    Pain Type  Chronic pain    Pain Onset  More than a month ago    Pain Frequency  Intermittent                       [facility] Adult PT Treatment/Exercise - 08/12/19 1310      Exercises   Exercises  Lumbar      Lumbar Exercises: Stretches   Piriformis Stretch Limitations  manual    Figure 4 Stretch  3 reps;30 seconds    Figure 4 Stretch Limitations  seated      Lumbar Exercises: Aerobic   Stationary Bike  L1 x 8 min;       Lumbar Exercises: Standing   Other Standing Lumbar Exercises  March x20, Hip abd x15 bil;     Other Standing  Lumbar Exercises  Standing QL stretch on L 30 sec x3;       Lumbar Exercises: Supine   Clam  20 reps    Clam Limitations  GTB     Bridge  --    Straight Leg Raise  --      Lumbar Exercises: Sidelying   Hip Abduction  Both;15 reps      Manual Therapy   Manual Therapy  Soft tissue mobilization    Manual therapy comments  skilled palpation and monitoring of soft tissue with dry needling     Soft tissue mobilization  DTM to R glute and hip       Trigger Point Dry Needling - 08/12/19 0001    Consent Given?  Yes    Education Handout Provided  Previously provided    Muscles Treated Back/Hip  Gluteus minimus;Gluteus medius;Piriformis    Gluteus Minimus Response  Palpable increased muscle length    Gluteus Medius Response  Palpable increased muscle length    Piriformis Response  Palpable increased muscle length             PT Short Term Goals - 07/29/19 1320      PT SHORT TERM GOAL #1   Title  Pt to be independent with initial HEP    Time  2    Period  Weeks    Status   New    Target Date  08/05/19        PT Long Term Goals - 07/29/19 1453      PT LONG TERM GOAL #1   Title  Pt to be independent with final HEP    Time  6    Period  Weeks    Status  New    Target Date  09/02/19      PT LONG TERM GOAL #2   Title  Pt to report decreased pain in L hip/glute, to 0-2/10 with activity    Time  6    Period  Weeks    Status  New    Target Date  09/02/19      PT LONG TERM GOAL #3   Title  Pt to demo improved hip strength to at least 4+/5 to improve stability and pain    Time  6    Period  Weeks    Status  New    Target Date  09/02/19      PT LONG TERM GOAL #4   Title  Pt to report standing at least 30 min without pain in hip or back.    Time  6    Period  Weeks    Status  New    Target Date  09/02/19            Plan - 08/12/19 1415    Clinical Impression Statement  Pt with soreness and tightness in L glute today, manual and Dry needling done for pain, pt with good tolerance. Discussed importance of hip strengthening and lumbar stretching for HEP. Pain likely stemming from back, with increased compression on L side. Pt to benefit from continued care.    Personal Factors and Comorbidities  Time since onset of injury/illness/exacerbation    Examination-Activity Limitations  Transfers;Stand;Squat;Locomotion Level    Examination-Participation Restrictions  Cleaning;Community Activity;Shop    Stability/Clinical Decision Making  Stable/Uncomplicated    Rehab Potential  Good    PT Frequency  2x / week    PT Duration  6 weeks    PT Treatment/Interventions  ADLs/Self Care Home Management;Cryotherapy;Electrical Stimulation;Iontophoresis 4mg /ml Dexamethasone;Moist Heat;Traction;Ultrasound;Gait training;Neuromuscular re-education;Balance training;Therapeutic exercise;Therapeutic activities;Functional mobility training;Stair training;Patient/family education;Manual techniques;Taping;Splinting;Dry needling;Energy conservation;Passive range of motion;Joint  Manipulations;Spinal Manipulations    Consulted and Agree with Plan of Care  Patient       Patient will benefit from skilled therapeutic intervention in order to improve the following deficits and impairments:  Abnormal gait, Decreased range of motion, Difficulty walking, Increased muscle spasms, Decreased endurance, Decreased activity tolerance, Pain, Improper body mechanics, Impaired flexibility, Hypomobility, Decreased mobility, Decreased strength  Visit Diagnosis: Pain in left hip  Chronic bilateral low back pain without sciatica     Problem List Patient Active Problem List   Diagnosis Date Noted  . Gastroesophageal reflux disease 01/19/2016  . Hoarseness, chronic 12/28/2015  . Complete left bundle branch block 12/08/2015  . Hypercholesterolemia 12/08/2015  . Hypertension 12/08/2015  . Hypothyroidism 12/08/2015  . Peripheral vascular disease, unspecified ([place]) 04/18/2013  . Chronic venous insufficiency 04/18/2013   Lyndee Hensen, PT, DPT 2:17 PM  08/12/19    [place], [place], [address] Phone: [phone]   Fax:  [phone]  Name: MARISSA WEAVER MRN: [id number] Date of Birth: 09-27-1940

## 2019-08-14 ENCOUNTER — Encounter: Payer: Medicare Other | Admitting: Physical Therapy

## 2019-08-19 ENCOUNTER — Ambulatory Visit (INDEPENDENT_AMBULATORY_CARE_PROVIDER_SITE_OTHER): Payer: Medicare Other | Admitting: Physical Therapy

## 2019-08-19 ENCOUNTER — Encounter: Payer: Self-pay | Admitting: Physical Therapy

## 2019-08-19 ENCOUNTER — Other Ambulatory Visit: Payer: Self-pay

## 2019-08-19 DIAGNOSIS — M545 Low back pain: Secondary | ICD-10-CM | POA: Diagnosis not present

## 2019-08-19 DIAGNOSIS — G8929 Other chronic pain: Secondary | ICD-10-CM | POA: Diagnosis not present

## 2019-08-19 DIAGNOSIS — M25552 Pain in left hip: Secondary | ICD-10-CM | POA: Diagnosis not present

## 2019-08-20 NOTE — Therapy (Signed)
[facility] [address] [place], [place], [address] Phone: [phone]   Fax:  [phone]  Physical Therapy Treatment  Patient Details  Name: Amber [name] MRN: [id number] Date of Birth: 12/03/1940 Referring Provider (PT): Orma Flaming   Encounter Date: 08/19/2019  PT End of Session - 08/19/19 1308    Visit Number  5    Number of Visits  12    Date for PT Re-Evaluation  09/02/19    Authorization Type  Medicare    PT Start Time  1302    PT Stop Time  1345    PT Time Calculation (min)  43 min    Activity Tolerance  Patient tolerated treatment well    Behavior During Therapy  [facility] for tasks assessed/performed       Past Medical History:  Diagnosis Date  . Arthritis   . DVT (deep venous thrombosis) ([place])   . Family history of blood clots   . Hypertension   . Hypothyroidism   . Left bundle branch block   . Peripheral vascular disease ([place])   . Post-phlebitic syndrome 04/18/2013  . Rotator cuff rupture, complete 07/05/2011  . Varicose veins     Past Surgical History:  Procedure Laterality Date  . APPENDECTOMY  1979  . BACK SURGERY  2009  . ENDOVENOUS ABLATION SAPHENOUS VEIN W/ LASER Right 09/15/2013   right greater saphenous vein, by Curt Jews MD  . ENDOVENOUS ABLATION SAPHENOUS VEIN W/ LASER Left 10/16/2013   left greater saphenous vein, by Curt Jews MD  . left open rotator cuff surgery   2010   . lumbar infusion  01/16/2008  . PARTIAL HYSTERECTOMY  1979  . SHOULDER OPEN ROTATOR CUFF REPAIR  07/05/2011   Procedure: ROTATOR CUFF REPAIR SHOULDER OPEN;  Surgeon: Tobi Bastos, MD;  [facility]: [facility];  Service: Orthopedics;  Laterality: Right;  . TOTAL KNEE ARTHROPLASTY Right 09/09/2014   Procedure: TOTAL RIGHT  KNEE ARTHROPLASTY;  Surgeon: Latanya Maudlin, MD;  [facility]: [facility];  Service: Orthopedics;  Laterality: Right;    There were no vitals filed for this visit.  Subjective Assessment - 08/19/19 1307    Subjective   Pt states L glute feelig a bit better, back has been sore with standing, shopping.    Patient Stated Goals  decreased pain    Currently in Pain?  Yes    Pain Score  4     Pain Location  Back    Pain Orientation  Left    Pain Descriptors / Indicators  Sore;Tightness    Pain Type  Chronic pain    Pain Onset  More than a month ago    Pain Frequency  Intermittent                       [facility] Adult PT Treatment/Exercise - 08/19/19 1309      Exercises   Exercises  Lumbar      Lumbar Exercises: Stretches   Single Knee to Chest Stretch  2 reps;30 seconds;Right;Left    Lower Trunk Rotation  2 reps;10 seconds    Piriformis Stretch Limitations  --    Figure 4 Stretch  3 reps;30 seconds    Figure 4 Stretch Limitations  seated    Other Lumbar Stretch Exercise  Standing QL stretch on L x2 nmin      Lumbar Exercises: Aerobic   Stationary Bike  L1 x 8 min;       Lumbar Exercises:  Standing   Other Standing Lumbar Exercises  March x20, Hip abd 2x10 bil;     Other Standing Lumbar Exercises  Standing QL stretch on L 30 sec x3;       Lumbar Exercises: Seated   Long Arc Quad on Chair  20 reps;Both      Lumbar Exercises: Supine   Pelvic Tilt  20 reps    Clam  --    Clam Limitations  --    Bent Knee Raise  20 reps    Straight Leg Raise  20 reps      Lumbar Exercises: Sidelying   Hip Abduction  --    Hip Abduction Limitations  hurts shoulder too much       Manual Therapy   Manual Therapy  Soft tissue mobilization    Manual therapy comments  --    Soft tissue mobilization  MFR to L glute and hip with tennsi ball                PT Short Term Goals - 07/29/19 1320      PT SHORT TERM GOAL #1   Title  Pt to be independent with initial HEP    Time  2    Period  Weeks    Status  New    Target Date  08/05/19        PT Long Term Goals - 07/29/19 1453      PT LONG TERM GOAL #1   Title  Pt to be independent with final HEP    Time  6    Period  Weeks    Status   New    Target Date  09/02/19      PT LONG TERM GOAL #2   Title  Pt to report decreased pain in L hip/glute, to 0-2/10 with activity    Time  6    Period  Weeks    Status  New    Target Date  09/02/19      PT LONG TERM GOAL #3   Title  Pt to demo improved hip strength to at least 4+/5 to improve stability and pain    Time  6    Period  Weeks    Status  New    Target Date  09/02/19      PT LONG TERM GOAL #4   Title  Pt to report standing at least 30 min without pain in hip or back.    Time  6    Period  Weeks    Status  New    Target Date  09/02/19            Plan - 08/20/19 1133    Clinical Impression Statement  Pt with less pain than initially, but continues to have soreness in L glute. Discussed continuing HEP daily, in am and pm for added relief. Pt to benefit from continued care.    Personal Factors and Comorbidities  Time since onset of injury/illness/exacerbation    Examination-Activity Limitations  Transfers;Stand;Squat;Locomotion Level    Examination-Participation Restrictions  Cleaning;Community Activity;Shop    Stability/Clinical Decision Making  Stable/Uncomplicated    Rehab Potential  Good    PT Frequency  2x / week    PT Duration  6 weeks    PT Treatment/Interventions  ADLs/Self Care Home Management;Cryotherapy;Electrical Stimulation;Iontophoresis 4mg /ml Dexamethasone;Moist Heat;Traction;Ultrasound;Gait training;Neuromuscular re-education;Balance training;Therapeutic exercise;Therapeutic activities;Functional mobility training;Stair training;Patient/family education;Manual techniques;Taping;Splinting;Dry needling;Energy conservation;Passive range of motion;Joint Manipulations;Spinal Manipulations    Consulted and Agree with  Plan of Care  Patient       Patient will benefit from skilled therapeutic intervention in order to improve the following deficits and impairments:  Abnormal gait, Decreased range of motion, Difficulty walking, Increased muscle spasms,  Decreased endurance, Decreased activity tolerance, Pain, Improper body mechanics, Impaired flexibility, Hypomobility, Decreased mobility, Decreased strength  Visit Diagnosis: Pain in left hip  Chronic bilateral low back pain without sciatica     Problem List Patient Active Problem List   Diagnosis Date Noted  . Gastroesophageal reflux disease 01/19/2016  . Hoarseness, chronic 12/28/2015  . Complete left bundle branch block 12/08/2015  . Hypercholesterolemia 12/08/2015  . Hypertension 12/08/2015  . Hypothyroidism 12/08/2015  . Peripheral vascular disease, unspecified (HCC) 04/18/2013  . Chronic venous insufficiency 04/18/2013   Sedalia Muta, PT, DPT 11:35 AM  08/20/19    [facility] [address] [place], [place], [address] Phone: [phone]   Fax:  [phone]  Name: Amber [name] MRN: [id number] Date of Birth: 1940-11-19

## 2019-08-21 ENCOUNTER — Other Ambulatory Visit: Payer: Self-pay

## 2019-08-21 ENCOUNTER — Encounter: Payer: Self-pay | Admitting: Physical Therapy

## 2019-08-21 ENCOUNTER — Ambulatory Visit (INDEPENDENT_AMBULATORY_CARE_PROVIDER_SITE_OTHER): Payer: Medicare Other | Admitting: Physical Therapy

## 2019-08-21 DIAGNOSIS — G8929 Other chronic pain: Secondary | ICD-10-CM

## 2019-08-21 DIAGNOSIS — M25552 Pain in left hip: Secondary | ICD-10-CM

## 2019-08-21 DIAGNOSIS — M545 Low back pain: Secondary | ICD-10-CM

## 2019-08-21 NOTE — Therapy (Signed)
[facility] [address] [place], [place], [address] Phone: [phone]   Fax:  [phone]  Physical Therapy Treatment  Patient Details  Name: Amber [name] MRN: [id number] Date of Birth: 19-Jan-1941 Referring Provider (PT): Orland Mustard   Encounter Date: 08/21/2019  PT End of Session - 08/21/19 2122    Visit Number  6    Number of Visits  12    Date for PT Re-Evaluation  09/02/19    Authorization Type  Medicare    PT Start Time  1345    PT Stop Time  1429    PT Time Calculation (min)  44 min    Activity Tolerance  Patient tolerated treatment well    Behavior During Therapy  [facility] for tasks assessed/performed       Past Medical History:  Diagnosis Date  . Arthritis   . DVT (deep venous thrombosis) (HCC)   . Family history of blood clots   . Hypertension   . Hypothyroidism   . Left bundle branch block   . Peripheral vascular disease (HCC)   . Post-phlebitic syndrome 04/18/2013  . Rotator cuff rupture, complete 07/05/2011  . Varicose veins     Past Surgical History:  Procedure Laterality Date  . APPENDECTOMY  1979  . BACK SURGERY  2009  . ENDOVENOUS ABLATION SAPHENOUS VEIN W/ LASER Right 09/15/2013   right greater saphenous vein, by Gretta Began MD  . ENDOVENOUS ABLATION SAPHENOUS VEIN W/ LASER Left 10/16/2013   left greater saphenous vein, by Gretta Began MD  . left open rotator cuff surgery   2010   . lumbar infusion  01/16/2008  . PARTIAL HYSTERECTOMY  1979  . SHOULDER OPEN ROTATOR CUFF REPAIR  07/05/2011   Procedure: ROTATOR CUFF REPAIR SHOULDER OPEN;  Surgeon: Jacki Cones, MD;  [facility];  Service: Orthopedics;  Laterality: Right;  . TOTAL KNEE ARTHROPLASTY Right 09/09/2014   Procedure: TOTAL RIGHT  KNEE ARTHROPLASTY;  Surgeon: Ranee Gosselin, MD;  [facility];  Service: Orthopedics;  Laterality: Right;    There were no vitals filed for this visit.  Subjective Assessment - 08/21/19 2121    Subjective   Pt with soreness in L glute, pelvic crest    Currently in Pain?  Yes    Pain Score  4     Pain Location  Back    Pain Orientation  Left    Pain Descriptors / Indicators  Aching;Tightness    Pain Type  Chronic pain    Pain Onset  More than a month ago    Pain Frequency  Intermittent                       [facility] Adult PT Treatment/Exercise - 08/21/19 1352      Exercises   Exercises  Lumbar      Lumbar Exercises: Stretches   Single Knee to Chest Stretch  2 reps;30 seconds;Right;Left    Lower Trunk Rotation  --    ITB Stretch  2 reps;30 seconds    ITB Stretch Limitations  manual    Figure 4 Stretch  3 reps;30 seconds    Figure 4 Stretch Limitations  supine    Other Lumbar Stretch Exercise  Standing QL stretch on L x1 nmin      Lumbar Exercises: Aerobic   Stationary Bike  L1 x 8 min;       Lumbar Exercises: Standing   Other Standing Lumbar Exercises  March  x20, Hip abd 2x10 bil;     Other Standing Lumbar Exercises  --      Lumbar Exercises: Seated   Long Arc Quad on Chair  --      Lumbar Exercises: Supine   Pelvic Tilt  --    Clam  20 reps    Clam Limitations  GTB     Bent Knee Raise  --    Straight Leg Raise  20 reps      Lumbar Exercises: Sidelying   Hip Abduction Limitations  --      Manual Therapy   Manual Therapy  Soft tissue mobilization    Manual therapy comments  skilled palpation and monitoring of soft tissue with dry needling     Soft tissue mobilization  DTM to L glute med, min, piriformis       Trigger Point Dry Needling - 08/21/19 0001    Consent Given?  Yes    Education Handout Provided  Previously provided    Muscles Treated Back/Hip  Gluteus minimus;Gluteus medius;Piriformis    Gluteus Minimus Response  Palpable increased muscle length    Gluteus Medius Response  Palpable increased muscle length    Piriformis Response  Palpable increased muscle length           PT Education - 08/21/19 2122    Education Details  HEP reviwed     Person(s) Educated  Patient    Methods  Explanation;Demonstration;Verbal cues;Tactile cues    Comprehension  Verbalized understanding;Returned demonstration;Verbal cues required;Tactile cues required       PT Short Term Goals - 07/29/19 1320      PT SHORT TERM GOAL #1   Title  Pt to be independent with initial HEP    Time  2    Period  Weeks    Status  New    Target Date  08/05/19        PT Long Term Goals - 07/29/19 1453      PT LONG TERM GOAL #1   Title  Pt to be independent with final HEP    Time  6    Period  Weeks    Status  New    Target Date  09/02/19      PT LONG TERM GOAL #2   Title  Pt to report decreased pain in L hip/glute, to 0-2/10 with activity    Time  6    Period  Weeks    Status  New    Target Date  09/02/19      PT LONG TERM GOAL #3   Title  Pt to demo improved hip strength to at least 4+/5 to improve stability and pain    Time  6    Period  Weeks    Status  New    Target Date  09/02/19      PT LONG TERM GOAL #4   Title  Pt to report standing at least 30 min without pain in hip or back.    Time  6    Period  Weeks    Status  New    Target Date  09/02/19            Plan - 08/21/19 2124    Clinical Impression Statement  Pt with soreness in L glute med, and into pelvic crest today. DN and DTM done for this, still sore after session. ITB and HIp IR stretching helping. Discussed importance of continued hip strengthening for HEP. Will assess pt  next week for continuation of PT or retun to MD.    Personal Factors and Comorbidities  Time since onset of injury/illness/exacerbation    Examination-Activity Limitations  Transfers;Stand;Squat;Locomotion Level    Examination-Participation Restrictions  Cleaning;Community Activity;Shop    Stability/Clinical Decision Making  Stable/Uncomplicated    Rehab Potential  Good    PT Frequency  2x / week    PT Duration  6 weeks    PT Treatment/Interventions  ADLs/Self Care Home  Management;Cryotherapy;Electrical Stimulation;Iontophoresis 4mg /ml Dexamethasone;Moist Heat;Traction;Ultrasound;Gait training;Neuromuscular re-education;Balance training;Therapeutic exercise;Therapeutic activities;Functional mobility training;Stair training;Patient/family education;Manual techniques;Taping;Splinting;Dry needling;Energy conservation;Passive range of motion;Joint Manipulations;Spinal Manipulations    Consulted and Agree with Plan of Care  Patient       Patient will benefit from skilled therapeutic intervention in order to improve the following deficits and impairments:  Abnormal gait, Decreased range of motion, Difficulty walking, Increased muscle spasms, Decreased endurance, Decreased activity tolerance, Pain, Improper body mechanics, Impaired flexibility, Hypomobility, Decreased mobility, Decreased strength  Visit Diagnosis: Pain in left hip  Chronic bilateral low back pain without sciatica     Problem List Patient Active Problem List   Diagnosis Date Noted  . Gastroesophageal reflux disease 01/19/2016  . Hoarseness, chronic 12/28/2015  . Complete left bundle branch block 12/08/2015  . Hypercholesterolemia 12/08/2015  . Hypertension 12/08/2015  . Hypothyroidism 12/08/2015  . Peripheral vascular disease, unspecified ([place]) 04/18/2013  . Chronic venous insufficiency 04/18/2013    Lyndee Hensen, PT, DPT 9:26 PM  08/21/19    [facility] [place], [place], [address] Phone: [phone]   Fax:  [phone]  Name: Amber [name] MRN: [id number] Date of Birth: 05/13/1941

## 2019-08-26 ENCOUNTER — Other Ambulatory Visit: Payer: Self-pay

## 2019-08-26 ENCOUNTER — Ambulatory Visit (INDEPENDENT_AMBULATORY_CARE_PROVIDER_SITE_OTHER): Payer: Medicare Other | Admitting: Physical Therapy

## 2019-08-26 DIAGNOSIS — M545 Low back pain, unspecified: Secondary | ICD-10-CM

## 2019-08-26 DIAGNOSIS — G8929 Other chronic pain: Secondary | ICD-10-CM | POA: Diagnosis not present

## 2019-08-26 DIAGNOSIS — M25552 Pain in left hip: Secondary | ICD-10-CM

## 2019-08-28 ENCOUNTER — Ambulatory Visit (INDEPENDENT_AMBULATORY_CARE_PROVIDER_SITE_OTHER): Payer: Medicare Other | Admitting: Physical Therapy

## 2019-08-28 ENCOUNTER — Encounter: Payer: Self-pay | Admitting: Physical Therapy

## 2019-08-28 ENCOUNTER — Other Ambulatory Visit: Payer: Self-pay

## 2019-08-28 DIAGNOSIS — G8929 Other chronic pain: Secondary | ICD-10-CM

## 2019-08-28 DIAGNOSIS — M25552 Pain in left hip: Secondary | ICD-10-CM | POA: Diagnosis not present

## 2019-08-28 DIAGNOSIS — M545 Low back pain: Secondary | ICD-10-CM

## 2019-08-28 NOTE — Patient Instructions (Signed)
Access Code: [email or web address] URL: [email or web address] Date: 08/28/2019 Prepared by: Sedalia Muta  Exercises Supine Single Knee to Chest Stretch - 2 x daily - 3 reps - 30 hold Supine Piriformis Stretch with Leg Straight - 2 x daily - 3 reps - 30 hold Supine Figure 4 Piriformis Stretch with Leg Extension - 2 x daily - 3 reps - 30 hold Supine Posterior Pelvic Tilt - 2 x daily - 10 reps - 2 sets Supine March - 1 x daily - 10 reps - 2 sets Hooklying Clamshell with Resistance - 1 x daily - 10 reps - 2 sets Sidelying Hip Abduction - 1 x daily - 10 reps - 2 sets Straight Leg Raise - 1 x daily - 2 sets - 10 reps Standing Marching - 1 x daily - 2 sets - 10 reps Standing Hip Abduction - 1 x daily - 2 sets - 10 reps

## 2019-08-28 NOTE — Therapy (Signed)
[facility] [address] [place], [place], [address] Phone: [phone]   Fax:  [phone]  Physical Therapy Treatment  Patient Details  Name: Amber [name] MRN: [id number] Date of Birth: 07-12-1940 Referring Provider (PT): Orland Mustard   Encounter Date: 08/26/2019  PT End of Session - 08/28/19 1224    Visit Number  7    Number of Visits  12    Date for PT Re-Evaluation  09/02/19    Authorization Type  Medicare    PT Start Time  1106    PT Stop Time  1148    PT Time Calculation (min)  42 min    Activity Tolerance  Patient tolerated treatment well    Behavior During Therapy  [facility] for tasks assessed/performed       Past Medical History:  Diagnosis Date  . Arthritis   . DVT (deep venous thrombosis) (HCC)   . Family history of blood clots   . Hypertension   . Hypothyroidism   . Left bundle branch block   . Peripheral vascular disease (HCC)   . Post-phlebitic syndrome 04/18/2013  . Rotator cuff rupture, complete 07/05/2011  . Varicose veins     Past Surgical History:  Procedure Laterality Date  . APPENDECTOMY  1979  . BACK SURGERY  2009  . ENDOVENOUS ABLATION SAPHENOUS VEIN W/ LASER Right 09/15/2013   right greater saphenous vein, by Gretta Began MD  . ENDOVENOUS ABLATION SAPHENOUS VEIN W/ LASER Left 10/16/2013   left greater saphenous vein, by Gretta Began MD  . left open rotator cuff surgery   2010   . lumbar infusion  01/16/2008  . PARTIAL HYSTERECTOMY  1979  . SHOULDER OPEN ROTATOR CUFF REPAIR  07/05/2011   Procedure: ROTATOR CUFF REPAIR SHOULDER OPEN;  Surgeon: Jacki Cones, MD;  Location: [facility];  Service: Orthopedics;  Laterality: Right;  . TOTAL KNEE ARTHROPLASTY Right 09/09/2014   Procedure: TOTAL RIGHT  KNEE ARTHROPLASTY;  Surgeon: Ranee Gosselin, MD;  Location: [facility];  Service: Orthopedics;  Laterality: Right;    There were no vitals filed for this visit.  Subjective Assessment - 08/28/19 1220    Subjective   Pt states improvment in glute pain, still sore, but better than last week.    Currently in Pain?  Yes    Pain Score  3     Pain Location  Back    Pain Orientation  Left    Pain Descriptors / Indicators  Aching;Tightness    Pain Type  Chronic pain    Pain Onset  More than a month ago    Pain Frequency  Intermittent                       [facility] Adult PT Treatment/Exercise - 08/28/19 0001      Exercises   Exercises  Lumbar      Lumbar Exercises: Stretches   Single Knee to Chest Stretch  2 reps;30 seconds;Right;Left    ITB Stretch  --    ITB Stretch Limitations  --    Figure 4 Stretch  --    Figure 4 Stretch Limitations  --    Other Lumbar Stretch Exercise  Standing QL stretch on L x1 nmin      Lumbar Exercises: Aerobic   Stationary Bike  L1 x 8 min;       Lumbar Exercises: Standing   Other Standing Lumbar Exercises  March x20, Hip abd 2x10 bil; Heel raises  x20;       Lumbar Exercises: Seated   Long Arc Quad on Chair  20 reps;Both      Lumbar Exercises: Supine   Clam  20 reps    Clam Limitations  GTB     Bridge  20 reps    Straight Leg Raise  20 reps      Manual Therapy   Manual Therapy  Soft tissue mobilization    Manual therapy comments  skilled palpation and monitoring of soft tissue with dry needling     Soft tissue mobilization  DTM to L glute med, min, piriformis       Trigger Point Dry Needling - 08/28/19 0001    Consent Given?  Yes    Education Handout Provided  Previously provided    Muscles Treated Back/Hip  Gluteus minimus;Gluteus medius;Piriformis    Gluteus Minimus Response  Palpable increased muscle length    Gluteus Medius Response  Palpable increased muscle length    Piriformis Response  Palpable increased muscle length;Twitch response elicited             PT Short Term Goals - 07/29/19 1320      PT SHORT TERM GOAL #1   Title  Pt to be independent with initial HEP    Time  2    Period  Weeks    Status  New    Target Date   08/05/19        PT Long Term Goals - 07/29/19 1453      PT LONG TERM GOAL #1   Title  Pt to be independent with final HEP    Time  6    Period  Weeks    Status  New    Target Date  09/02/19      PT LONG TERM GOAL #2   Title  Pt to report decreased pain in L hip/glute, to 0-2/10 with activity    Time  6    Period  Weeks    Status  New    Target Date  09/02/19      PT LONG TERM GOAL #3   Title  Pt to demo improved hip strength to at least 4+/5 to improve stability and pain    Time  6    Period  Weeks    Status  New    Target Date  09/02/19      PT LONG TERM GOAL #4   Title  Pt to report standing at least 30 min without pain in hip or back.    Time  6    Period  Weeks    Status  New    Target Date  09/02/19            Plan - 08/28/19 1225    Clinical Impression Statement  Pt continues to have sorenss in L glute med, pelvic crest region, better today than last week. Pt responds well to manual, but has not had significant lasting pain relief. Plan to continue care.    Personal Factors and Comorbidities  Time since onset of injury/illness/exacerbation    Examination-Activity Limitations  Transfers;Stand;Squat;Locomotion Level    Examination-Participation Restrictions  Cleaning;Community Activity;Shop    Stability/Clinical Decision Making  Stable/Uncomplicated    Rehab Potential  Good    PT Frequency  2x / week    PT Duration  6 weeks    PT Treatment/Interventions  ADLs/Self Care Home Management;Cryotherapy;Electrical Stimulation;Iontophoresis 4mg /ml Dexamethasone;Moist Heat;Traction;Ultrasound;Gait training;Neuromuscular re-education;Balance training;Therapeutic exercise;Therapeutic activities;Functional mobility training;Stair  training;Patient/family education;Manual techniques;Taping;Splinting;Dry needling;Energy conservation;Passive range of motion;Joint Manipulations;Spinal Manipulations    Consulted and Agree with Plan of Care  Patient       Patient will benefit  from skilled therapeutic intervention in order to improve the following deficits and impairments:  Abnormal gait, Decreased range of motion, Difficulty walking, Increased muscle spasms, Decreased endurance, Decreased activity tolerance, Pain, Improper body mechanics, Impaired flexibility, Hypomobility, Decreased mobility, Decreased strength  Visit Diagnosis: Pain in left hip  Chronic bilateral low back pain without sciatica     Problem List Patient Active Problem List   Diagnosis Date Noted  . Gastroesophageal reflux disease 01/19/2016  . Hoarseness, chronic 12/28/2015  . Complete left bundle branch block 12/08/2015  . Hypercholesterolemia 12/08/2015  . Hypertension 12/08/2015  . Hypothyroidism 12/08/2015  . Peripheral vascular disease, unspecified ([place]) 04/18/2013  . Chronic venous insufficiency 04/18/2013    Lyndee Hensen, PT, DPT 12:29 PM  08/28/19    [place], [place], [address] Phone: [phone]   Fax:  [phone]  Name: Amber [name] MRN: [id number] Date of Birth: 1940-10-20

## 2019-08-29 NOTE — Therapy (Signed)
[facility] [address] [place], [place], [address] Phone: [phone]   Fax:  [phone]  Physical Therapy Treatment /Discharge   Patient Details  Name: Amber [name] MRN: [id number] Date of Birth: 05/19/1941 Referring Provider (PT): Orma Flaming   Encounter Date: 08/28/2019  PT End of Session - 08/28/19 1312    Visit Number  8    Number of Visits  12    Date for PT Re-Evaluation  09/02/19    Authorization Type  Medicare    PT Start Time  1307    PT Stop Time  1345    PT Time Calculation (min)  38 min    Activity Tolerance  Patient tolerated treatment well    Behavior During Therapy  [facility] for tasks assessed/performed       Past Medical History:  Diagnosis Date  . Arthritis   . DVT (deep venous thrombosis) ([place])   . Family history of blood clots   . Hypertension   . Hypothyroidism   . Left bundle branch block   . Peripheral vascular disease ([place])   . Post-phlebitic syndrome 04/18/2013  . Rotator cuff rupture, complete 07/05/2011  . Varicose veins     Past Surgical History:  Procedure Laterality Date  . APPENDECTOMY  1979  . BACK SURGERY  2009  . ENDOVENOUS ABLATION SAPHENOUS VEIN W/ LASER Right 09/15/2013   right greater saphenous vein, by Curt Jews MD  . ENDOVENOUS ABLATION SAPHENOUS VEIN W/ LASER Left 10/16/2013   left greater saphenous vein, by Curt Jews MD  . left open rotator cuff surgery   2010   . lumbar infusion  01/16/2008  . PARTIAL HYSTERECTOMY  1979  . SHOULDER OPEN ROTATOR CUFF REPAIR  07/05/2011   Procedure: ROTATOR CUFF REPAIR SHOULDER OPEN;  Surgeon: Tobi Bastos, MD;  [facility];  Service: Orthopedics;  Laterality: Right;  . TOTAL KNEE ARTHROPLASTY Right 09/09/2014   Procedure: TOTAL RIGHT  KNEE ARTHROPLASTY;  Surgeon: Latanya Maudlin, MD;  [facility];  Service: Orthopedics;  Laterality: Right;    There were no vitals filed for this visit.  Subjective Assessment - 08/28/19 1311     Subjective  Pt with variable pain. Had less pain last visit, today and yesterday states soreness due to "doing too much" did errands for 4-5 hrs on Tuesday after PT.    Currently in Pain?  Yes    Pain Score  5     Pain Location  Back    Pain Orientation  Right    Pain Descriptors / Indicators  Aching    Pain Type  Chronic pain    Pain Onset  More than a month ago    Pain Frequency  Intermittent         [facility] PT Assessment - 08/29/19 0001      Strength   Overall Strength Comments   4+/5                    [facility] Adult PT Treatment/Exercise - 08/28/19 1315      Exercises   Exercises  Lumbar      Lumbar Exercises: Stretches   Single Knee to Chest Stretch  2 reps;30 seconds;Right;Left    Other Lumbar Stretch Exercise  Standing QL stretch on L x1 nmin      Lumbar Exercises: Aerobic   Stationary Bike  L1 x 8 min;       Lumbar Exercises: Standing   Other Standing Lumbar  Exercises  March x20, Hip abd 2x10 bil;       Lumbar Exercises: Seated   Long Arc Quad on Chair  20 reps;Both      Lumbar Exercises: Supine   Clam  20 reps    Clam Limitations  GTB     Bent Knee Raise  20 reps    Bent Knee Raise Limitations  GTB    Bridge  20 reps    Straight Leg Raise  20 reps      Manual Therapy   Manual Therapy  Soft tissue mobilization    Manual therapy comments  --    Soft tissue mobilization  STM/Roller to L glute              PT Education - 08/29/19 1431    Education Details  Final HEP reviewed       PT Short Term Goals - 08/28/19 1313      PT SHORT TERM GOAL #1   Title  Pt to be independent with initial HEP    Time  2    Period  Weeks    Status  Achieved    Target Date  08/05/19        PT Long Term Goals - 08/28/19 1313      PT LONG TERM GOAL #1   Title  Pt to be independent with final HEP    Time  6    Period  Weeks    Status  Achieved      PT LONG TERM GOAL #2   Title  Pt to report decreased pain in L hip/glute, to 0-2/10 with activity     Time  6    Period  Weeks    Status  Partially Met      PT LONG TERM GOAL #3   Title  Pt to demo improved hip strength to at least 4+/5 to improve stability and pain    Time  6    Period  Weeks    Status  Partially Met      PT LONG TERM GOAL #4   Title  Pt to report standing at least 30 min without pain in hip or back.    Baseline  Able to walk/stand 1.5 hrs.    Time  6    Period  Weeks    Status  Achieved            Plan - 08/29/19 1433    Clinical Impression Statement  Pt reports overall improvments. She reports being able to stand and walk for longer duration without pain. She has improved strength of LEs and core, and improved ability for HEP. Continues to have mild weakness in L hip, an soreness into L glute. Expect that pain is stemming from back, due to only mild changes in glute pain with much manual therpay and dry needling. Pt with variable soreness in back and hip, depending on amount of activity. Pt has met most goals at this time, and is ready for d/c to HEP. Final HEp reviwed today, pt in agreement with plan.    Personal Factors and Comorbidities  Time since onset of injury/illness/exacerbation    Examination-Activity Limitations  Transfers;Stand;Squat;Locomotion Level    Examination-Participation Restrictions  Cleaning;Community Activity;Shop    Stability/Clinical Decision Making  Stable/Uncomplicated    Rehab Potential  Good    PT Frequency  2x / week    PT Duration  6 weeks    PT Treatment/Interventions  ADLs/Self Care Home  Management;Cryotherapy;Electrical Stimulation;Iontophoresis '4mg'$ /ml Dexamethasone;Moist Heat;Traction;Ultrasound;Gait training;Neuromuscular re-education;Balance training;Therapeutic exercise;Therapeutic activities;Functional mobility training;Stair training;Patient/family education;Manual techniques;Taping;Splinting;Dry needling;Energy conservation;Passive range of motion;Joint Manipulations;Spinal Manipulations    Consulted and Agree with Plan  of Care  Patient       Patient will benefit from skilled therapeutic intervention in order to improve the following deficits and impairments:  Abnormal gait, Decreased range of motion, Difficulty walking, Increased muscle spasms, Decreased endurance, Decreased activity tolerance, Pain, Improper body mechanics, Impaired flexibility, Hypomobility, Decreased mobility, Decreased strength  Visit Diagnosis: Pain in left hip  Chronic bilateral low back pain without sciatica     Problem List Patient Active Problem List   Diagnosis Date Noted  . Gastroesophageal reflux disease 01/19/2016  . Hoarseness, chronic 12/28/2015  . Complete left bundle branch block 12/08/2015  . Hypercholesterolemia 12/08/2015  . Hypertension 12/08/2015  . Hypothyroidism 12/08/2015  . Peripheral vascular disease, unspecified ([place]) 04/18/2013  . Chronic venous insufficiency 04/18/2013   Lyndee Hensen, PT, DPT 2:35 PM  08/29/19    [facility] [place], [place], [address] Phone: [phone]   Fax:  [phone]  Name: Amber [name] MRN: [id number] Date of Birth: 13-Apr-1941     PHYSICAL THERAPY DISCHARGE SUMMARY  Visits from Start of Care: 8 Plan: Patient agrees to discharge.  Patient goals were partially met. Patient is being discharged due to meeting the stated rehab goals.  ?????     Lyndee Hensen, PT, DPT 2:36 PM  08/29/19

## 2019-09-05 ENCOUNTER — Telehealth: Payer: Self-pay | Admitting: Family Medicine

## 2019-09-05 DIAGNOSIS — I10 Essential (primary) hypertension: Secondary | ICD-10-CM

## 2019-09-05 MED ORDER — HYDROCHLOROTHIAZIDE 12.5 MG PO CAPS: 12.5000 mg | ORAL_CAPSULE | Freq: Every day | ORAL | 0 refills | Status: DC

## 2019-09-05 NOTE — Telephone Encounter (Signed)
  LAST APPOINTMENT DATE: 07/29/2019   NEXT APPOINTMENT DATE:@5 /21/2021  MEDICATION:hydrochlorothiazide (MICROZIDE) 12.5 MG capsule  PHARMACY:[id number] - , [place] - [address] AT [facility] [address]  COMMENT: [facility] that [facility] the medication and requested for it to be sent to [facility]   **Let patient know to contact pharmacy at the end of the day to make sure medication is ready. **  ** Please notify patient to allow 48-72 hours to process**  **Encourage patient to contact the pharmacy for refills or they can request refills through [facility]**  CLINICAL FILLS OUT ALL BELOW:   LAST REFILL:  QTY:  REFILL DATE:    OTHER COMMENTS:    Okay for refill?  Please advise

## 2019-09-05 NOTE — Telephone Encounter (Signed)
Medication sent to pharmacy  

## 2019-10-17 ENCOUNTER — Ambulatory Visit (INDEPENDENT_AMBULATORY_CARE_PROVIDER_SITE_OTHER): Payer: Medicare Other | Admitting: Family Medicine

## 2019-10-17 ENCOUNTER — Ambulatory Visit: Payer: Medicare Other

## 2019-10-17 ENCOUNTER — Other Ambulatory Visit: Payer: Self-pay

## 2019-10-17 ENCOUNTER — Encounter: Payer: Self-pay | Admitting: Family Medicine

## 2019-10-17 VITALS — BP 120/60 | HR 74 | Temp 97.2°F | Ht 61.0 in | Wt 178.2 lb

## 2019-10-17 DIAGNOSIS — I10 Essential (primary) hypertension: Secondary | ICD-10-CM | POA: Diagnosis not present

## 2019-10-17 DIAGNOSIS — E038 Other specified hypothyroidism: Secondary | ICD-10-CM | POA: Diagnosis not present

## 2019-10-17 DIAGNOSIS — M79672 Pain in left foot: Secondary | ICD-10-CM

## 2019-10-17 DIAGNOSIS — G8929 Other chronic pain: Secondary | ICD-10-CM

## 2019-10-17 DIAGNOSIS — R200 Anesthesia of skin: Secondary | ICD-10-CM | POA: Diagnosis not present

## 2019-10-17 DIAGNOSIS — M25511 Pain in right shoulder: Secondary | ICD-10-CM

## 2019-10-17 DIAGNOSIS — M79671 Pain in right foot: Secondary | ICD-10-CM

## 2019-10-17 LAB — CBC WITH DIFFERENTIAL/PLATELET
Basophils Absolute: 0 10*3/uL (ref 0.0–0.1)
Basophils Relative: 0.4 % (ref 0.0–3.0)
Eosinophils Absolute: 0.1 10*3/uL (ref 0.0–0.7)
Eosinophils Relative: 1.1 % (ref 0.0–5.0)
HCT: 39 % (ref 36.0–46.0)
Hemoglobin: 13.5 g/dL (ref 12.0–15.0)
Lymphocytes Relative: 22.9 % (ref 12.0–46.0)
Lymphs Abs: 2 10*3/uL (ref 0.7–4.0)
MCHC: 34.7 g/dL (ref 30.0–36.0)
MCV: 92.3 fl (ref 78.0–100.0)
Monocytes Absolute: 0.8 10*3/uL (ref 0.1–1.0)
Monocytes Relative: 9.4 % (ref 3.0–12.0)
Neutro Abs: 5.9 10*3/uL (ref 1.4–7.7)
Neutrophils Relative %: 66.2 % (ref 43.0–77.0)
Platelets: 171 10*3/uL (ref 150.0–400.0)
RBC: 4.22 Mil/uL (ref 3.87–5.11)
RDW: 13.4 % (ref 11.5–15.5)
WBC: 8.9 10*3/uL (ref 4.0–10.5)

## 2019-10-17 LAB — COMPREHENSIVE METABOLIC PANEL
ALT: 14 U/L (ref 0–35)
AST: 17 U/L (ref 0–37)
Albumin: 4.3 g/dL (ref 3.5–5.2)
Alkaline Phosphatase: 77 U/L (ref 39–117)
BUN: 21 mg/dL (ref 6–23)
CO2: 30 mEq/L (ref 19–32)
Calcium: 9.4 mg/dL (ref 8.4–10.5)
Chloride: 91 mEq/L — ABNORMAL LOW (ref 96–112)
Creatinine, Ser: 0.58 mg/dL (ref 0.40–1.20)
GFR: 99.72 mL/min (ref >60.00)
Glucose, Bld: 101 mg/dL — ABNORMAL HIGH (ref 70–99)
Potassium: 3.4 mEq/L — ABNORMAL LOW (ref 3.5–5.1)
Sodium: 128 mEq/L — ABNORMAL LOW (ref 135–145)
Total Bilirubin: 1.4 mg/dL — ABNORMAL HIGH (ref 0.2–1.2)
Total Protein: 6.1 g/dL (ref 6.0–8.3)

## 2019-10-17 LAB — VITAMIN B12: Vitamin B-12: 478 pg/mL (ref 211–911)

## 2019-10-17 LAB — TSH: TSH: 0.73 u[IU]/mL (ref 0.35–4.50)

## 2019-10-17 MED ORDER — LEVOTHYROXINE SODIUM 50 MCG PO TABS: 50.0000 ug | ORAL_TABLET | Freq: Every day | ORAL | 3 refills | Status: DC

## 2019-10-17 MED ORDER — ATORVASTATIN CALCIUM 20 MG PO TABS: 20.0000 mg | ORAL_TABLET | Freq: Every day | ORAL | 3 refills | Status: DC

## 2019-10-17 MED ORDER — HYDROCHLOROTHIAZIDE 12.5 MG PO CAPS: 12.5000 mg | ORAL_CAPSULE | Freq: Every day | ORAL | 1 refills | Status: DC

## 2019-10-17 MED ORDER — LOSARTAN POTASSIUM-HCTZ 50-12.5 MG PO TABS: 1.0000 | ORAL_TABLET | Freq: Every day | ORAL | 1 refills | Status: DC

## 2019-10-17 NOTE — Progress Notes (Addendum)
Patient: Amber [name] MRN: [id number] DOB: Mar 22, 1941 PCP: Orma Flaming, MD     Subjective:  Chief Complaint  Patient presents with  . Hypertension  . Hypothyroidism  . Foot Pain  . Shoulder Pain    HPI: The patient is a 79 y.o. female who presents today for routine follow up and routine labs. She complains of bilateral shoulder pain and bilateral foot pain.   Hypertension Hypertension: Here for follow up of hypertension.  Currently on hctz 12.5mg  and hyzaar 50-12.5mg  daily. Takes medication as prescribed and denies any side effects. Exercise includes stationary bike and housework. Weight has been stable. Denies any chest pain, headaches, shortness of breath, vision changes, swelling in lower extremities.   Hypothyroidism Denies any symptoms of hyper or hypo thyroidism. [facility]   Bilateral shoulder pain She has had surgery on her right shoulder and doesn't think anything can be done. She states her left one is feeling okay, she just doesn't want anything to happen. She states her right shoulder is the same function as it has always been, but she now is having what feels like numbness at the tips of her fingers of the 3-5 digits. She feels this when she is on the phone the most holding it. After she puts the phone down it feels fine. She has decreased strength in her hand as well. She is right handed. No recent trauma.   Bilateral foot pain She states it is the ball of her feet. She is pretty sure she has arthritis. Pain is not all of the time, only after she has been on concrete for longer periods of time. Pain is in her toes and bottom of her feet and she feels like her toes get numb. She has not really done anything for the pain in her feet. She has taken ibuprofen on a rare occasion and this helps, but she thinks it gives her diarrhea.   She had a fall on 08/22/19 walking to the mailbox. She fell on her back and did hit her head. She was able to stand  up and collect herself. Denies any injuries from this.   Has had her covid series.   Review of Systems  Constitutional: Negative for fatigue and fever.  HENT: Negative for congestion and sore throat.   Cardiovascular: Negative for chest pain and palpitations.  Genitourinary: Negative for frequency, pelvic pain and urgency.  Musculoskeletal: Positive for arthralgias.  Neurological: Negative for dizziness, light-headedness and headaches.    Allergies Patient is allergic to codeine; darvocet [propoxyphene n-acetaminophen]; nickel; and sulfa antibiotics.  Past Medical History Patient  has a past medical history of Arthritis, DVT (deep venous thrombosis) ([place]), Family history of blood clots, Hypertension, Hypothyroidism, Left bundle branch block, Peripheral vascular disease ([place]), Post-phlebitic syndrome (04/18/2013), Rotator cuff rupture, complete (07/05/2011), and Varicose veins.  Surgical History Patient  has a past surgical history that includes Appendectomy (1979); Back surgery (2009); Shoulder open rotator cuff repair (07/05/2011); Endovenous ablation saphenous vein w/ laser (Right, 09/15/2013); Endovenous ablation saphenous vein w/ laser (Left, 10/16/2013); left open rotator cuff surgery  (2010 ); Total knee arthroplasty (Right, 09/09/2014); lumbar infusion (01/16/2008); and Partial hysterectomy (1979).  Family History Pateint's family history includes AAA (abdominal aortic aneurysm) in her brother; Cancer in her mother and sister; Deep vein thrombosis in her mother; Diabetes in her brother, mother, sister, sister, and sister; Heart disease in her father, mother, and sister; Heart disease (age of onset: 55) in her sister; Hypertension in  her mother, sister, and sister; Peripheral vascular disease in her brother; Varicose Veins in her mother and sister; Vision loss in her brother, sister, sister, and sister.  Social History Patient  reports that she quit smoking about 55 years ago. Her smoking  use included cigarettes. She has a 3.25 pack-year smoking history. She has never used smokeless tobacco. She reports that she does not drink alcohol or use drugs.    Objective: Vitals:   10/17/19 0939  BP: 120/60  Pulse: 74  Temp: (!) 97.2 F (36.2 C)  TempSrc: Temporal  SpO2: 99%  Weight: 178 lb 3.2 oz (80.8 kg)  Height: 5\' 1"  (1.549 m)    Body mass index is 33.67 kg/m.  Physical Exam Vitals reviewed.  Constitutional:      Appearance: Normal appearance. She is obese.  HENT:     Head: Normocephalic and atraumatic.     Right Ear: Tympanic membrane, ear canal and external ear normal.     Left Ear: Tympanic membrane, ear canal and external ear normal.  Neck:     Vascular: No carotid bruit.  Cardiovascular:     Rate and Rhythm: Normal rate and regular rhythm.     Heart sounds: Normal heart sounds.  Pulmonary:     Effort: Pulmonary effort is normal.     Breath sounds: Normal breath sounds.  Abdominal:     General: Abdomen is flat. Bowel sounds are normal.     Palpations: Abdomen is soft.  Musculoskeletal:     Cervical back: Normal range of motion and neck supple.     Comments: Bilateral feet: bunions bilaterally and starting to get hammer toes in both of her 3-4 toes. Pedal pulses are thready.   Hand grip normal bilaterally. Sensation intact. Negative tinel/phalen. Strength intact in arms. Decreased rom in her right shoulder at baseline.   Skin:    General: Skin is warm.     Capillary Refill: Capillary refill takes less than 2 seconds.  Neurological:     General: No focal deficit present.     Mental Status: She is alert and oriented to person, place, and time.  Psychiatric:        Mood and Affect: Mood normal.        Behavior: Behavior normal.      Office Visit from 10/17/2019 in [facility] Total Score  0          Assessment/plan: 1. Essential hypertension Blood pressure is to goal. Continue current anti-hypertensive medications.  Refills given and routine lab work will be done today. Recommended routine exercise and healthy diet including DASH diet and mediterranean diet. Encouraged weight loss. F/u in 6 months.   - hydrochlorothiazide (MICROZIDE) 12.5 MG capsule; Take 1 capsule (12.5 mg total) by mouth daily.  Dispense: 90 capsule; Refill: 1 - CBC with Differential/Platelet - Comprehensive metabolic panel  2. Other specified hypothyroidism Appears euthyroid. Refills given. Checking tsh.  - TSH  3. Numbness Unsure if true numbness vs. Tingling. Appears to be postural with holding phone and possible medial nerve entrapment. She desires no further work up at this time. Will let me know if symptoms getting worse.  - Vitamin B12  4. Bilateral foot pain Bunions/hammer toes and thready pulses. Possible claudication. Declines further work up with ABI and referral for feet. She will try voltaren gel and let me know if pain becomes more bothersome.   5. Chronic pain in right shoulder At baseline. Declines PT or work up. Her  tingling in fingers appears to be more postural/median nerve issues. Will do trial voltaren and advised to not keep her arm/hand flexed for prolonged periods of time. She will let me know if symptoms worsen. I do think PT would be beneficial for her.     This visit occurred during the SARS-CoV-2 public health emergency.  Safety protocols were in place, including screening questions prior to the visit, additional usage of staff PPE, and extensive cleaning of exam room while observing appropriate contact time as indicated for disinfecting solutions.     Return in about 6 months (around 04/18/2020) for routine follow up. Orland Mustard, MD [facility]   10/17/2019

## 2019-10-17 NOTE — Patient Instructions (Signed)
Get over the counter voltaren gel for your feet/shoulder.  If they continue to hurt, let me know so we can send you to the podiatrist. You have bunions and slight hammer toes that could also be contributing and thready pulses. You just let me know!   Shoulder pain.. can always send you PT! Can put voltaren gel on this as well. If numbness gets worse we need to consider imaging.   You look great! Routine labs today! See you back in 6 months.   Dr. Artis Flock

## 2019-10-20 ENCOUNTER — Other Ambulatory Visit: Payer: Self-pay | Admitting: Family Medicine

## 2019-10-20 DIAGNOSIS — E871 Hypo-osmolality and hyponatremia: Secondary | ICD-10-CM

## 2019-10-28 ENCOUNTER — Other Ambulatory Visit: Payer: Self-pay

## 2019-10-28 ENCOUNTER — Other Ambulatory Visit (INDEPENDENT_AMBULATORY_CARE_PROVIDER_SITE_OTHER): Payer: Medicare Other

## 2019-10-28 DIAGNOSIS — E871 Hypo-osmolality and hyponatremia: Secondary | ICD-10-CM

## 2019-10-28 LAB — COMPREHENSIVE METABOLIC PANEL
ALT: 13 U/L (ref 0–35)
AST: 15 U/L (ref 0–37)
Albumin: 4.2 g/dL (ref 3.5–5.2)
Alkaline Phosphatase: 67 U/L (ref 39–117)
BUN: 10 mg/dL (ref 6–23)
CO2: 31 mEq/L (ref 19–32)
Calcium: 9.2 mg/dL (ref 8.4–10.5)
Chloride: 98 mEq/L (ref 96–112)
Creatinine, Ser: 0.56 mg/dL (ref 0.40–1.20)
GFR: 103.83 mL/min (ref >60.00)
Glucose, Bld: 97 mg/dL (ref 70–99)
Potassium: 3.6 mEq/L (ref 3.5–5.1)
Sodium: 137 mEq/L (ref 135–145)
Total Bilirubin: 0.8 mg/dL (ref 0.2–1.2)
Total Protein: 6 g/dL (ref 6.0–8.3)

## 2019-10-30 LAB — OSMOLALITY: Osmolality: 280 mOsm/kg (ref 278–305)

## 2019-12-05 ENCOUNTER — Ambulatory Visit (INDEPENDENT_AMBULATORY_CARE_PROVIDER_SITE_OTHER): Payer: Medicare Other

## 2019-12-05 DIAGNOSIS — Z0000 Encounter for general adult medical examination without abnormal findings: Secondary | ICD-10-CM

## 2019-12-05 NOTE — Progress Notes (Signed)
Virtual Visit via Telephone Note  I connected with  Amber PetrinPatsy S [name] on 12/05/19 at  2:00 PM EDT by telephone and verified that I am speaking with the correct person using two identifiers.  Medicare Annual Wellness visit completed telephonically due to Covid-19 pandemic.   Persons participating in this call: This Health Coach and this patient.   Location: Patient: Home Provider: Office   I discussed the limitations, risks, security and privacy concerns of performing an evaluation and management service by telephone and the availability of in person appointments. The patient expressed understanding and agreed to proceed.  Unable to perform video visit due to video visit attempted and failed and/or patient does not have video capability.   Some vital signs may be absent or patient reported.   Marzella Schleinina H Carole Doner, LPN    Subjective:   Amber [name] is a 79 y.o. female who presents for an Initial Medicare Annual Wellness Visit.  Review of Systems     Cardiac Risk Factors include: advanced age (>3555men, 61>65 women);obesity (BMI >30kg/m2);hypertension     Objective:    There were no vitals filed for this visit. There is no height or weight on file to calculate BMI.  Advanced Directives 12/05/2019 07/29/2019 11/07/2018 04/04/2018 03/23/2017 09/09/2014 09/09/2014  Does Patient Have a Medical Advance Directive? Yes No Yes No Yes Yes -  Type of Advance Directive Living will - Living will;Out of facility DNR (pink MOST or yellow form) - Healthcare Power of [place];Living will Living will (No Data)  Does patient want to make changes to medical advance directive? - - - - No - Patient declined No - Patient declined No - Patient declined  Copy of Healthcare Power of Attorney in Chart? - - - - - No - copy requested -  Would patient like information on creating a medical advance directive? - No - Patient declined - No - Patient declined - - -  Pre-existing out of facility DNR order (yellow form or pink MOST  form) - - - - - - -    Current Medications (verified) Outpatient Encounter Medications as of 12/05/2019  Medication Sig  . acetaminophen (TYLENOL) 500 MG tablet Take 500-1,000 mg by mouth See admin instructions. Take 500mg  in the AM and 1000mg  in the PM.  . aspirin 81 MG chewable tablet Chew 81 mg by mouth at bedtime.   Marland Kitchen. atorvastatin (LIPITOR) 20 MG tablet Take 1 tablet (20 mg total) by mouth at bedtime.  . B Complex Vitamins (VITAMIN-B COMPLEX) TABS Take 1 tablet by mouth daily.   . Cholecalciferol (VITAMIN D3) 1000 units CAPS Take 1,000 Units by mouth daily.   . clobetasol ointment (TEMOVATE) 0.05 % clobetasol 0.05 % topical ointment  . hydrochlorothiazide (MICROZIDE) 12.5 MG capsule Take 1 capsule (12.5 mg total) by mouth daily.  Marland Kitchen. levothyroxine (SYNTHROID) 50 MCG tablet Take 1 tablet (50 mcg total) by mouth daily before breakfast.  . losartan-hydrochlorothiazide (HYZAAR) 50-12.5 MG tablet Take 1 tablet by mouth daily.  . Multiple Vitamins-Minerals (PRESERVISION AREDS PO) Take 1 tablet by mouth 2 (two) times daily.   Marland Kitchen. nystatin [facility](NYAMYC) powder Nyamyc 100,000 unit/gram topical powder  . polyvinyl alcohol (LIQUIFILM TEARS) 1.4 % ophthalmic solution Place 1 drop into both eyes daily as needed for dry eyes.  . vitamin C (ASCORBIC ACID) 500 MG tablet Take 500 mg by mouth daily.  . meclizine (ANTIVERT) 25 MG tablet Take 25 mg by mouth 2 (two) times daily. (Patient not taking: Reported on 12/05/2019)  No facility-administered encounter medications on file as of 12/05/2019.    Allergies (verified) Codeine, Darvocet [propoxyphene n-acetaminophen], Nickel, and Sulfa antibiotics   History: Past Medical History:  Diagnosis Date  . Arthritis   . DVT (deep venous thrombosis) (HCC)   . Family history of blood clots   . Hypertension   . Hypothyroidism   . Left bundle branch block   . Peripheral vascular disease (HCC)   . Post-phlebitic syndrome 04/18/2013  . Rotator cuff rupture, complete  07/05/2011  . Varicose veins    Past Surgical History:  Procedure Laterality Date  . APPENDECTOMY  1979  . BACK SURGERY  2009  . ENDOVENOUS ABLATION SAPHENOUS VEIN W/ LASER Right 09/15/2013   right greater saphenous vein, by Gretta Began MD  . ENDOVENOUS ABLATION SAPHENOUS VEIN W/ LASER Left 10/16/2013   left greater saphenous vein, by Gretta Began MD  . left open rotator cuff surgery   2010   . lumbar infusion  01/16/2008  . PARTIAL HYSTERECTOMY  1979  . SHOULDER OPEN ROTATOR CUFF REPAIR  07/05/2011   Procedure: ROTATOR CUFF REPAIR SHOULDER OPEN;  Surgeon: Jacki Cones, MD;  Location: [facility];  Service: Orthopedics;  Laterality: Right;  . TOTAL KNEE ARTHROPLASTY Right 09/09/2014   Procedure: TOTAL RIGHT  KNEE ARTHROPLASTY;  Surgeon: Ranee Gosselin, MD;  Location: [facility];  Service: Orthopedics;  Laterality: Right;   Family History  Problem Relation Age of Onset  . Heart disease Mother   . Cancer Mother   . Varicose Veins Mother   . Deep vein thrombosis Mother   . Diabetes Mother   . Hypertension Mother   . Heart disease Father   . Varicose Veins Sister   . Cancer Sister   . Diabetes Sister   . Heart disease Sister 26  . Hypertension Sister   . Vision loss Sister   . Diabetes Brother   . Vision loss Brother   . Peripheral vascular disease Brother   . AAA (abdominal aortic aneurysm) Brother   . Diabetes Sister   . Heart disease Sister   . Hypertension Sister   . Vision loss Sister   . Diabetes Sister   . Vision loss Sister    Social History   Socioeconomic History  . Marital status: Widowed    Spouse name: Not on file  . Number of children: Not on file  . Years of education: Not on file  . Highest education level: Not on file  Occupational History  . Occupation: retired  Tobacco Use  . Smoking status: Former Smoker    Packs/day: 0.25    Years: 13.00    Pack years: 3.25    Types: Cigarettes    Quit date: 06/22/1964    Years since quitting: 55.4  . Smokeless  tobacco: Never Used  Substance and Sexual Activity  . Alcohol use: No  . Drug use: No  . Sexual activity: Never    Birth control/protection: Post-menopausal  Other Topics Concern  . Not on file  Social History Narrative   Lives home alone.  Is a widow.  Education: HS grad.  2 children. Caffeine 2.5 cups daily.   Social Determinants of Health   Financial Resource Strain: Low Risk   . Difficulty of Paying Living Expenses: Not hard at all  Food Insecurity: No Food Insecurity  . Worried About Programme researcher, broadcasting/film/video in the Last Year: Never true  . Ran Out of Food in the Last Year: Never true  Transportation Needs:  No Transportation Needs  . Lack of Transportation (Medical): No  . Lack of Transportation (Non-Medical): No  Physical Activity: Inactive  . Days of Exercise per Week: 0 days  . Minutes of Exercise per Session: 0 min  Stress: No Stress Concern Present  . Feeling of Stress : Not at all  Social Connections: Moderately Integrated  . Frequency of Communication with Friends and Family: More than three times a week  . Frequency of Social Gatherings with Friends and Family: More than three times a week  . Attends Religious Services: More than 4 times per year  . Active Member of Clubs or Organizations: Yes  . Attends Banker Meetings: More than 4 times per year  . Marital Status: Widowed    Tobacco Counseling Counseling given: Not Answered   Clinical Intake:  Pre-visit preparation completed: Yes  Pain : No/denies pain (back soreness at times)     BMI - recorded: 33.69 Nutritional Status: BMI > 30  Obese Diabetes: No  How often do you need to have someone help you when you read instructions, pamphlets, or other written materials from your doctor or pharmacy?: 1 - Never  Diabetic?No  Interpreter Needed?: No  Information entered by :: Lanier Ensign, LPN   Activities of Daily Living In your present state of health, do you have any difficulty performing  the following activities: 12/05/2019  Hearing? N  Vision? N  Difficulty concentrating or making decisions? N  Walking or climbing stairs? N  Dressing or bathing? N  Doing errands, shopping? N  Preparing Food and eating ? N  Using the Toilet? N  In the past six months, have you accidently leaked urine? Y  Comment wears poise and depends  Do you have problems with loss of bowel control? N  Managing your Medications? N  Managing your Finances? N  Housekeeping or managing your Housekeeping? N  Some recent data might be hidden    Patient Care Team: Orland Mustard, MD as PCP - General (Family Medicine) Helane Rima, DO (Family Medicine)  Indicate any recent Medical Services you may have received from other than [facility] providers in the past year (date may be approximate).     Assessment:   This is a routine wellness examination for Amber [name].  Hearing/Vision screen  Hearing Screening   125Hz  250Hz  500Hz  1000Hz  2000Hz  3000Hz  4000Hz  6000Hz  8000Hz   Right ear:           Left ear:           Comments: Pt denies hearing loss at this time  Vision Screening Comments: Pt states she had macular eye problems and follow up with Dr annually  Dietary issues and exercise activities discussed: Current Exercise Habits: Home exercise routine (stationary bike), Type of exercise: Other - see comments (stationary bike), Exercise limited by: orthopedic condition(s)  Goals    . Patient Stated     Started weight loss group to lose weight      Depression Screen PHQ 2/9 Scores 10/17/2019 04/18/2019  PHQ - 2 Score 0 0    Fall Risk Fall Risk  12/05/2019 10/17/2019 11/13/2018 05/15/2017  Falls in the past year? 1 1 1  No  Number falls in past yr: 1 0 0 -  Injury with Fall? 0 0 1 -  Risk for fall due to : Impaired balance/gait;Impaired mobility - - -  Follow up Falls prevention discussed - - -    Any stairs in or around the home? Yes  If so,  are there any without handrails? Yes  Home free of loose  throw rugs in walkways, pet beds, electrical cords, etc? Yes  Adequate lighting in your home to reduce risk of falls? Yes   ASSISTIVE DEVICES UTILIZED TO PREVENT FALLS:  Life alert? Yes  Use of a cane, walker or w/c? No  Grab bars in the bathroom? Yes Shower chair or bench in shower? Yes Elevated toilet seat or a handicapped toilet? No   TIMED UP AND GO:  Was the test performed? No     Cognitive Function: MMSE - Mini Mental State Exam 05/15/2017  Orientation to time 3  Orientation to Place 5  Registration 3  Attention/ Calculation 1  Recall 3  Language- name 2 objects 2  Language- repeat 1  Language- follow 3 step command 3  Language- read & follow direction 1  Write a sentence 1  Copy design 1  Total score 24     6CIT Screen 12/05/2019  What Year? 0 points  What month? 0 points  What time? 0 points  Count back from 20 0 points  Months in reverse 2 points  Repeat phrase 0 points  Total Score 2    Immunizations Immunization History  Administered Date(s) Administered  . Fluad Quad(high Dose 65+) 02/17/2019  . Influenza, High Dose Seasonal PF 04/10/2016, 04/03/2017, 04/29/2018  . Influenza,inj,Quad PF,6+ Mos 02/24/2015, 04/01/2018  . Influenza,inj,quad, With Preservative 04/01/2018  . PFIZER SARS-COV-2 Vaccination 07/15/2019, 08/05/2019  . Pneumococcal Polysaccharide-23 11/06/2012    TDAP status: Due, Education has been provided regarding the importance of this vaccine. Advised may receive this vaccine at local pharmacy or Health Dept. Aware to provide a copy of the vaccination record if obtained from local pharmacy or Health Dept. Verbalized acceptance and understanding. Flu Vaccine status: Up to date Pneumococcal vaccine status: Up to date Covid-19 vaccine status: Completed vaccines  Qualifies for Shingles Vaccine? Yes   Zostavax completed No   Shingrix Completed?: No.    Education has been provided regarding the importance of this vaccine. Patient has been  advised to call insurance company to determine out of pocket expense if they have not yet received this vaccine. Advised may also receive vaccine at local pharmacy or Health Dept. Verbalized acceptance and understanding.  Screening Tests Health Maintenance  Topic Date Due  . TETANUS/TDAP  Never done  . PNA vac Low Risk Adult (2 of 2 - PCV13) 11/06/2013  . INFLUENZA VACCINE  12/28/2019  . DEXA SCAN  Completed  . COVID-19 Vaccine  Completed  . MAMMOGRAM  Discontinued    Health Maintenance  Health Maintenance Due  Topic Date Due  . TETANUS/TDAP  Never done  . PNA vac Low Risk Adult (2 of 2 - PCV13) 11/06/2013    Colorectal cancer screening: No longer required.  Mammogram status: No longer required Bone Density status: Completed 12/14/06. Results reflect: Bone density results: OSTEOPENIA. Repeat every 2 years.  Additional Screening  Vision Screening: Recommended annual ophthalmology exams for early detection of glaucoma and other disorders of the eye. Is the patient up to date with their annual eye exam?  Yes  Who is the provider or what is the name of the office in which the patient attends annual eye exams? Dr Cathey Endow   Dental Screening: Recommended annual dental exams for proper oral hygiene  Community Resource Referral / Chronic Care Management: CRR required this visit?  No   CCM required this visit?  No      Plan:  I have personally reviewed and noted the following in the patient's chart:   . Medical and social history . Use of alcohol, tobacco or illicit drugs  . Current medications and supplements . Functional ability and status . Nutritional status . Physical activity . Advanced directives . List of other physicians . Hospitalizations, surgeries, and ER visits in previous 12 months . Vitals . Screenings to include cognitive, depression, and falls . Referrals and appointments  In addition, I have reviewed and discussed with patient certain preventive  protocols, quality metrics, and best practice recommendations. A written personalized care plan for preventive services as well as general preventive health recommendations were provided to patient.     Marzella Schlein, LPN   12/03/2421   Nurse Notes: None

## 2019-12-05 NOTE — Patient Instructions (Addendum)
Amber [name] , Thank you for taking time to come for your Medicare Wellness Visit. I appreciate your ongoing commitment to your health goals. Please review the following plan we discussed and let me know if I can assist you in the future.   Screening recommendations/referrals: Colonoscopy: No longer required Mammogram: No longer required Bone Density: Completed 12/14/2006 Recommended yearly ophthalmology/optometry visit for glaucoma screening and checkup Recommended yearly dental visit for hygiene and checkup  Vaccinations: Influenza vaccine: Up to date Pneumococcal vaccine: Done 11/06/12 Tdap vaccine: Due information given Shingles vaccine: Shingrix discussed. Please contact your pharmacy for coverage information. Covid-19: Completed 2/16 & 08/05/19  Advanced directives: Copy in chart  Conditions/risks identified: Started in a weight lose group called [employer]  Next appointment: Follow up in one year for your annual wellness visit    Preventive Care 65 Years and Older, Female Preventive care refers to lifestyle choices and visits with your health care provider that can promote health and wellness. What does preventive care include?  A yearly physical exam. This is also called an annual well check.  Dental exams once or twice a year.  Routine eye exams. Ask your health care provider how often you should have your eyes checked.  Personal lifestyle choices, including:  Daily care of your teeth and gums.  Regular physical activity.  Eating a healthy diet.  Avoiding tobacco and drug use.  Limiting alcohol use.  Practicing safe sex.  Taking low-dose aspirin every day.  Taking vitamin and mineral supplements as recommended by your health care provider. What happens during an annual well check? The services and screenings done by your health care provider during your annual well check will depend on your age, overall health, lifestyle risk factors, and family history of  disease. Counseling  Your health care provider may ask you questions about your:  Alcohol use.  Tobacco use.  Drug use.  Emotional well-being.  Home and relationship well-being.  Sexual activity.  Eating habits.  History of falls.  Memory and ability to understand (cognition).  Work and work Astronomer.  Reproductive health. Screening  You may have the following tests or measurements:  Height, weight, and BMI.  Blood pressure.  Lipid and cholesterol levels. These may be checked every 5 years, or more frequently if you are over 69 years old.  Skin check.  Lung cancer screening. You may have this screening every year starting at age 82 if you have a 30-pack-year history of smoking and currently smoke or have quit within the past 15 years.  Fecal occult blood test (FOBT) of the stool. You may have this test every year starting at age 37.  Flexible sigmoidoscopy or colonoscopy. You may have a sigmoidoscopy every 5 years or a colonoscopy every 10 years starting at age 27.  Hepatitis C blood test.  Hepatitis B blood test.  Sexually transmitted disease (STD) testing.  Diabetes screening. This is done by checking your blood sugar (glucose) after you have not eaten for a while (fasting). You may have this done every 1-3 years.  Bone density scan. This is done to screen for osteoporosis. You may have this done starting at age 27.  Mammogram. This may be done every 1-2 years. Talk to your health care provider about how often you should have regular mammograms. Talk with your health care provider about your test results, treatment options, and if necessary, the need for more tests. Vaccines  Your health care provider may recommend certain vaccines, such as:  Influenza vaccine. This is recommended every year.  Tetanus, diphtheria, and acellular pertussis (Tdap, Td) vaccine. You may need a Td booster every 10 years.  Zoster vaccine. You may need this after age  7.  Pneumococcal 13-valent conjugate (PCV13) vaccine. One dose is recommended after age 54.  Pneumococcal polysaccharide (PPSV23) vaccine. One dose is recommended after age 41. Talk to your health care provider about which screenings and vaccines you need and how often you need them. This information is not intended to replace advice given to you by your health care provider. Make sure you discuss any questions you have with your health care provider. Document Released: 06/11/2015 Document Revised: 02/02/2016 Document Reviewed: 03/16/2015 Elsevier Interactive Patient Education  2017 [place] Prevention in the Home Falls can cause injuries. They can happen to people of all ages. There are many things you can do to make your home safe and to help prevent falls. What can I do on the outside of my home?  Regularly fix the edges of walkways and driveways and fix any cracks.  Remove anything that might make you trip as you walk through a door, such as a raised step or threshold.  Trim any bushes or trees on the path to your home.  Use bright outdoor lighting.  Clear any walking paths of anything that might make someone trip, such as rocks or tools.  Regularly check to see if handrails are loose or broken. Make sure that both sides of any steps have handrails.  Any raised decks and porches should have guardrails on the edges.  Have any leaves, snow, or ice cleared regularly.  Use sand or salt on walking paths during winter.  Clean up any spills in your garage right away. This includes oil or grease spills. What can I do in the bathroom?  Use night lights.  Install grab bars by the toilet and in the tub and shower. Do not use towel bars as grab bars.  Use non-skid mats or decals in the tub or shower.  If you need to sit down in the shower, use a plastic, non-slip stool.  Keep the floor dry. Clean up any water that spills on the floor as soon as it happens.  Remove  soap buildup in the tub or shower regularly.  Attach bath mats securely with double-sided non-slip rug tape.  Do not have throw rugs and other things on the floor that can make you trip. What can I do in the bedroom?  Use night lights.  Make sure that you have a light by your bed that is easy to reach.  Do not use any sheets or blankets that are too big for your bed. They should not hang down onto the floor.  Have a firm chair that has side arms. You can use this for support while you get dressed.  Do not have throw rugs and other things on the floor that can make you trip. What can I do in the kitchen?  Clean up any spills right away.  Avoid walking on wet floors.  Keep items that you use a lot in easy-to-reach places.  If you need to reach something above you, use a strong step stool that has a grab bar.  Keep electrical cords out of the way.  Do not use floor polish or wax that makes floors slippery. If you must use wax, use non-skid floor wax.  Do not have throw rugs and other things on the floor that  can make you trip. What can I do with my stairs?  Do not leave any items on the stairs.  Make sure that there are handrails on both sides of the stairs and use them. Fix handrails that are broken or loose. Make sure that handrails are as long as the stairways.  Check any carpeting to make sure that it is firmly attached to the stairs. Fix any carpet that is loose or worn.  Avoid having throw rugs at the top or bottom of the stairs. If you do have throw rugs, attach them to the floor with carpet tape.  Make sure that you have a light switch at the top of the stairs and the bottom of the stairs. If you do not have them, ask someone to add them for you. What else can I do to help prevent falls?  Wear shoes that:  Do not have high heels.  Have rubber bottoms.  Are comfortable and fit you well.  Are closed at the toe. Do not wear sandals.  If you use a  stepladder:  Make sure that it is fully opened. Do not climb a closed stepladder.  Make sure that both sides of the stepladder are locked into place.  Ask someone to hold it for you, if possible.  Clearly mark and make sure that you can see:  Any grab bars or handrails.  First and last steps.  Where the edge of each step is.  Use tools that help you move around (mobility aids) if they are needed. These include:  Canes.  Walkers.  Scooters.  Crutches.  Turn on the lights when you go into a dark area. Replace any light bulbs as soon as they burn out.  Set up your furniture so you have a clear path. Avoid moving your furniture around.  If any of your floors are uneven, fix them.  If there are any pets around you, be aware of where they are.  Review your medicines with your doctor. Some medicines can make you feel dizzy. This can increase your chance of falling. Ask your doctor what other things that you can do to help prevent falls. This information is not intended to replace advice given to you by your health care provider. Make sure you discuss any questions you have with your health care provider. Document Released: 03/11/2009 Document Revised: 10/21/2015 Document Reviewed: 06/19/2014 Elsevier Interactive Patient Education  2017 [employer].

## 2020-01-08 ENCOUNTER — Ambulatory Visit (INDEPENDENT_AMBULATORY_CARE_PROVIDER_SITE_OTHER): Payer: Medicare Other | Admitting: Family Medicine

## 2020-01-22 ENCOUNTER — Ambulatory Visit (INDEPENDENT_AMBULATORY_CARE_PROVIDER_SITE_OTHER): Payer: Medicare Other | Admitting: Family Medicine

## 2020-03-02 ENCOUNTER — Ambulatory Visit: Payer: Medicare Other

## 2020-03-17 ENCOUNTER — Ambulatory Visit (INDEPENDENT_AMBULATORY_CARE_PROVIDER_SITE_OTHER): Payer: Medicare Other | Admitting: Bariatrics

## 2020-03-17 ENCOUNTER — Other Ambulatory Visit: Payer: Self-pay

## 2020-03-17 ENCOUNTER — Encounter (INDEPENDENT_AMBULATORY_CARE_PROVIDER_SITE_OTHER): Payer: Self-pay | Admitting: Bariatrics

## 2020-03-17 VITALS — BP 150/80 | HR 84 | Temp 98.2°F | Ht 62.0 in | Wt 176.0 lb

## 2020-03-17 DIAGNOSIS — Z1331 Encounter for screening for depression: Secondary | ICD-10-CM

## 2020-03-17 DIAGNOSIS — E669 Obesity, unspecified: Secondary | ICD-10-CM

## 2020-03-17 DIAGNOSIS — R0602 Shortness of breath: Secondary | ICD-10-CM

## 2020-03-17 DIAGNOSIS — R5383 Other fatigue: Secondary | ICD-10-CM | POA: Diagnosis not present

## 2020-03-17 DIAGNOSIS — E559 Vitamin D deficiency, unspecified: Secondary | ICD-10-CM

## 2020-03-17 DIAGNOSIS — Z6832 Body mass index (BMI) 32.0-32.9, adult: Secondary | ICD-10-CM

## 2020-03-17 DIAGNOSIS — R7309 Other abnormal glucose: Secondary | ICD-10-CM

## 2020-03-17 DIAGNOSIS — I447 Left bundle-branch block, unspecified: Secondary | ICD-10-CM

## 2020-03-17 DIAGNOSIS — E6609 Other obesity due to excess calories: Secondary | ICD-10-CM

## 2020-03-17 DIAGNOSIS — Z0289 Encounter for other administrative examinations: Secondary | ICD-10-CM

## 2020-03-17 DIAGNOSIS — I10 Essential (primary) hypertension: Secondary | ICD-10-CM | POA: Diagnosis not present

## 2020-03-17 DIAGNOSIS — K219 Gastro-esophageal reflux disease without esophagitis: Secondary | ICD-10-CM

## 2020-03-17 DIAGNOSIS — E7800 Pure hypercholesterolemia, unspecified: Secondary | ICD-10-CM | POA: Diagnosis not present

## 2020-03-18 ENCOUNTER — Encounter: Payer: Self-pay | Admitting: Family Medicine

## 2020-03-18 ENCOUNTER — Encounter (INDEPENDENT_AMBULATORY_CARE_PROVIDER_SITE_OTHER): Payer: Self-pay | Admitting: Bariatrics

## 2020-03-18 ENCOUNTER — Ambulatory Visit (INDEPENDENT_AMBULATORY_CARE_PROVIDER_SITE_OTHER): Payer: Medicare Other

## 2020-03-18 DIAGNOSIS — R7303 Prediabetes: Secondary | ICD-10-CM | POA: Insufficient documentation

## 2020-03-18 DIAGNOSIS — Z23 Encounter for immunization: Secondary | ICD-10-CM | POA: Diagnosis not present

## 2020-03-18 LAB — COMPREHENSIVE METABOLIC PANEL
ALT: 24 IU/L (ref 0–32)
AST: 18 IU/L (ref 0–40)
Albumin/Globulin Ratio: 2.3 — ABNORMAL HIGH (ref 1.2–2.2)
Albumin: 4.4 g/dL (ref 3.6–4.6)
Alkaline Phosphatase: 90 IU/L (ref 44–121)
BUN/Creatinine Ratio: 20 (ref 12–28)
BUN: 11 mg/dL (ref 8–27)
Bilirubin Total: 1 mg/dL (ref 0.0–1.2)
CO2: 29 mmol/L (ref 20–29)
Calcium: 9.4 mg/dL (ref 8.7–10.3)
Chloride: 96 mmol/L (ref 96–106)
Creatinine, Ser: 0.56 mg/dL — ABNORMAL LOW (ref 0.57–1.00)
GFR calc Af Amer: 101 mL/min/{1.73_m2} (ref >59)
GFR calc non Af Amer: 88 mL/min/{1.73_m2} (ref >59)
Globulin, Total: 1.9 g/dL (ref 1.5–4.5)
Glucose: 92 mg/dL (ref 65–99)
Potassium: 4 mmol/L (ref 3.5–5.2)
Sodium: 138 mmol/L (ref 134–144)
Total Protein: 6.3 g/dL (ref 6.0–8.5)

## 2020-03-18 LAB — LIPID PANEL WITH LDL/HDL RATIO
Cholesterol, Total: 133 mg/dL (ref 100–199)
HDL: 53 mg/dL (ref >39)
LDL Chol Calc (NIH): 59 mg/dL (ref 0–99)
LDL/HDL Ratio: 1.1 ratio (ref 0.0–3.2)
Triglycerides: 119 mg/dL (ref 0–149)
VLDL Cholesterol Cal: 21 mg/dL (ref 5–40)

## 2020-03-18 LAB — INSULIN, RANDOM: INSULIN: 14.7 u[IU]/mL (ref 2.6–24.9)

## 2020-03-18 LAB — HEMOGLOBIN A1C
Est. average glucose Bld gHb Est-mCnc: 120 mg/dL
Hgb A1c MFr Bld: 5.8 % — ABNORMAL HIGH (ref 4.8–5.6)

## 2020-03-18 LAB — VITAMIN D 25 HYDROXY (VIT D DEFICIENCY, FRACTURES): Vit D, 25-Hydroxy: 66.6 ng/mL (ref 30.0–100.0)

## 2020-03-18 NOTE — Progress Notes (Signed)
Chief Complaint:   OBESITY Amber [name] (MR# [id number]) is an 79 y.o. female who presents for evaluation and treatment of obesity and related comorbidities. Current BMI is Body mass index is 32.19 kg/m.Marland Kitchen Amber [name] has been struggling with her weight for many years and has been unsuccessful in either losing weight, maintaining weight loss, or reaching her healthy weight goal.  Amber [name] is currently in the action stage of change and ready to dedicate time achieving and maintaining a healthier weight. Amber [name] is interested in becoming our patient and working on intensive lifestyle modifications including (but not limited to) diet and exercise for weight loss.  Amber [name] does not like to cook. She craves sweets.  Amber [name]'s habits were reviewed today and are as follows: Her family eats meals together, she thinks her family will eat healthier with her, her desired weight loss is 16-26 lbs, she started gaining weight in her 70's, her heaviest weight ever was 180 pounds, she craves ice cream, cheese, chocolate candy bars, she snacks frequently in the evenings, she skips lunch frequently, she frequently makes poor food choices and she struggles with emotional eating.  Depression Screen Amber [name]'s Food and Mood (modified PHQ-9) score was 8.  Depression screen PHQ 2/9 03/17/2020  Decreased Interest 2  Down, Depressed, Hopeless 0  PHQ - 2 Score 2  Altered sleeping 0  Tired, decreased energy 2  Change in appetite 1  Feeling bad or failure about yourself  1  Trouble concentrating 1  Moving slowly or fidgety/restless 1  Suicidal thoughts 0  PHQ-9 Score 8  Difficult doing work/chores Somewhat difficult   Subjective:   Other fatigue. Amber [name] denies daytime somnolence and admits to waking up still tired. Amber [name] generally gets 7-8 hours of sleep per night, and states that she has generally restful sleep. Snoring is present. Apneic episodes are not present. Epworth Sleepiness Score is 5.  SOB (shortness of breath)  on exertion. Jomarie notes increasing shortness of breath with exercising and seems to be worsening over time with weight gain. She notes getting out of breath sooner with activity than she used to. Brienna denies shortness of breath at rest or orthopnea.  Hypercholesterolemia. Hypercholesterolemia is reasonably well controlled.  Essential hypertension. Blood pressure is reasonably well controlled.  BP Readings from Last 3 Encounters:  03/17/20 (!) 150/80  10/17/19 120/60  04/18/19 138/75   Lab Results  Component Value Date   CREATININE 0.56 10/28/2019   CREATININE 0.58 10/17/2019   Gastroesophageal reflux disease, unspecified whether esophagitis present. Amber [name] is on no medication. Symptoms are controlled.  Complete left bundle branch block. See EKG report - no changes.  Vitamin D deficiency. Bhakti is taking OTC Vitamin D supplementation.   Elevated glucose. Amber [name] denies polyphagia.  Depression screening. Amber [name] had a mildly positive depression screen with a PHQ-9 score of 8.  Assessment/Plan:   Other fatigue. Fatigue may be related to obesity, depression or many other causes. Labs will be ordered, and in the meanwhile, Amber [name] will focus on self care including making healthy food choices, increasing physical activity and focusing on stress reduction. EKG 12-Lead, Comprehensive metabolic panel, Hemoglobin A1c, Insulin, random, Lipid Panel With LDL/HDL Ratio, VITAMIN D 25 Hydroxy (Vit-D Deficiency, Fractures) tests ordered today.  SOB (shortness of breath) on exertion. Amber [name]'s shortness of breath appears to be obesity related and exercise induced. She has agreed to work on weight loss and gradually increase exercise to treat her exercise induced shortness of breath. Will continue to monitor closely.  Hypercholesterolemia. Amber [name]  will continue her current medication. Comprehensive metabolic panel, Lipid Panel With LDL/HDL Ratio labs will be checked today.   Essential hypertension.  Amber [name] is  working on healthy weight loss and exercise to improve blood pressure control. We will watch for signs of hypotension as she continues her lifestyle modifications. She will continue her current medication as directed.   Gastroesophageal reflux disease, unspecified whether esophagitis present. Intensive lifestyle modifications are the first line treatment for this issue. We discussed several lifestyle modifications today and she will continue to work on diet, exercise and weight loss efforts. Orders and follow up as documented in patient record. Amber [name] will watch triggers.  Counseling . If a person has gastroesophageal reflux disease (GERD), food and stomach acid move back up into the esophagus and cause symptoms or problems such as damage to the esophagus. . Anti-reflux measures include: raising the head of the bed, avoiding tight clothing or belts, avoiding eating late at night, not lying down shortly after mealtime, and achieving weight loss. . Avoid ASA, NSAID's, caffeine, alcohol, and tobacco.  . OTC Pepcid and/or Tums are often very helpful for as needed use.  Marland Kitchen However, for persisting chronic or daily symptoms, stronger medications like Omeprazole may be needed. . You may need to avoid foods and drinks such as: ? Coffee and tea (with or without caffeine). ? Drinks that contain alcohol. ? Energy drinks and sports drinks. ? Bubbly (carbonated) drinks or sodas. ? Chocolate and cocoa. ? Peppermint and mint flavorings. ? Garlic and onions. ? Horseradish. ? Spicy and acidic foods. These include peppers, chili powder, curry powder, vinegar, hot sauces, and BBQ sauce. ? Citrus fruit juices and citrus fruits, such as oranges, lemons, and limes. ? Tomato-based foods. These include red sauce, chili, salsa, and pizza with red sauce. ? Fried and fatty foods. These include donuts, french fries, potato chips, and high-fat dressings. ? High-fat meats. These include hot dogs, rib eye steak, sausage, ham,  and bacon.  Complete left bundle branch block. Amber [name] will follow-up with her PCP as needed.  Vitamin D deficiency. Low Vitamin D level contributes to fatigue and are associated with obesity, breast, and colon cancer. VITAMIN D 25 Hydroxy (Vit-D Deficiency, Fractures) level will be checked today.   Elevated glucose.  Hemoglobin A1c, Insulin, random labs will be checked today.   Depression screening. Amber [name] had a positive depression screening. Depression is commonly associated with obesity and often results in emotional eating behaviors. We will monitor this closely and work on CBT to help improve the non-hunger eating patterns. Referral to Psychology may be required if no improvement is seen as she continues in our clinic.  Class 1 obesity with serious comorbidity and body mass index (BMI) of 32.0 to 32.9 in adult, unspecified obesity type.  Amber [name] is currently in the action stage of change and her goal is to continue with weight loss efforts. I recommend Amber [name] begin the structured treatment plan as follows:  She has agreed to the Category 1 Plan.   Amber [name] will work on meal planning.  We reviewed with the patient labs from 10/28/2019 including CMP and glucose; 10/17/2019 including CBC, glucose, and thyroid.  Exercise goals: Older adults should follow the adult guidelines. When older adults cannot meet the adult guidelines, they should be as physically active as their abilities and conditions will allow.    Behavioral modification strategies: increasing lean protein intake, decreasing simple carbohydrates, increasing vegetables, increasing water intake, decreasing eating out, no skipping meals, meal planning and cooking strategies,  keeping healthy foods in the home and planning for success.  She was informed of the importance of frequent follow-up visits to maximize her success with intensive lifestyle modifications for her multiple health conditions. She was informed we would discuss her lab  results at her next visit unless there is a critical issue that needs to be addressed sooner. Amber [name] agreed to keep her next visit at the agreed upon time to discuss these results.  Objective:   Blood pressure (!) 150/80, pulse 84, temperature 98.2 F (36.8 C), height 5\' 2"  (1.575 m), weight 176 lb (79.8 kg), SpO2 98 %. Body mass index is 32.19 kg/m.  EKG: Sinus  Rhythm with a rate of 78 BPM. Left bundle branch block (chronic). No change. Abnormal.    Indirect Calorimeter completed today shows a VO2 of 147 and a REE of 1024.  Her calculated basal metabolic rate is thus her basal metabolic rate is worse than expected.  General: Cooperative, alert, well developed, in no acute distress. HEENT: Conjunctivae and lids unremarkable. Cardiovascular: Regular rhythm.  Lungs: Normal work of breathing. Neurologic: No focal deficits.   Lab Results  Component Value Date   HGBA1C 5.7 (H) 02/20/2017   Lab Results  Component Value Date   TSH 0.73 10/17/2019   Lab Results  Component Value Date   WBC 8.9 10/17/2019   HGB 13.5 10/17/2019   HCT 39.0 10/17/2019   MCV 92.3 10/17/2019   PLT 171.0 10/17/2019   Lab Results  Component Value Date   IRON 128 11/15/2018   TIBC 376 11/15/2018   FERRITIN 57 11/15/2018   Obesity Behavioral Intervention:   Approximately 15 minutes were spent on the discussion below.  ASK: We discussed the diagnosis of obesity with Amber [name] today and Amber [name] agreed to give 11/17/2018 permission to discuss obesity behavioral modification therapy today.  ASSESS: Amber [name] has the diagnosis of obesity and her BMI today is 32.2. Amber [name] is in the action stage of change.   ADVISE: Amber [name] was educated on the multiple health risks of obesity as well as the benefit of weight loss to improve her health. She was advised of the need for long term treatment and the importance of lifestyle modifications to improve her current health and to decrease her risk of future health  problems.  AGREE: Multiple dietary modification options and treatment options were discussed and Shellie agreed to follow the recommendations documented in the above note.  ARRANGE: Aerica was educated on the importance of frequent visits to treat obesity as outlined per CMS and USPSTF guidelines and agreed to schedule her next follow up appointment today.  Attestation Statements:   Reviewed by clinician on day of visit: allergies, medications, problem list, medical history, surgical history, family history, social history, and previous encounter notes.  Lura Em, am acting as Fernanda Drum for Energy manager, DO   I have reviewed the above documentation for accuracy and completeness, and I agree with the above. -[employer], DO

## 2020-03-27 ENCOUNTER — Encounter (INDEPENDENT_AMBULATORY_CARE_PROVIDER_SITE_OTHER): Payer: Self-pay | Admitting: Bariatrics

## 2020-03-29 DIAGNOSIS — E6609 Other obesity due to excess calories: Secondary | ICD-10-CM | POA: Insufficient documentation

## 2020-03-31 ENCOUNTER — Other Ambulatory Visit: Payer: Self-pay

## 2020-03-31 ENCOUNTER — Encounter (INDEPENDENT_AMBULATORY_CARE_PROVIDER_SITE_OTHER): Payer: Self-pay | Admitting: Bariatrics

## 2020-03-31 ENCOUNTER — Ambulatory Visit (INDEPENDENT_AMBULATORY_CARE_PROVIDER_SITE_OTHER): Payer: Medicare Other | Admitting: Bariatrics

## 2020-03-31 VITALS — BP 119/70 | HR 80 | Temp 97.6°F | Ht 62.0 in | Wt 176.0 lb

## 2020-03-31 DIAGNOSIS — I10 Essential (primary) hypertension: Secondary | ICD-10-CM | POA: Diagnosis not present

## 2020-03-31 DIAGNOSIS — R7303 Prediabetes: Secondary | ICD-10-CM

## 2020-03-31 DIAGNOSIS — E669 Obesity, unspecified: Secondary | ICD-10-CM | POA: Diagnosis not present

## 2020-03-31 DIAGNOSIS — Z6832 Body mass index (BMI) 32.0-32.9, adult: Secondary | ICD-10-CM

## 2020-03-31 NOTE — Progress Notes (Signed)
Chief Complaint:   OBESITY Amber [name] is here to discuss her progress with her obesity treatment plan along with follow-up of her obesity related diagnoses. Amber [name] is on the Category 1 Plan and states she is following her eating plan approximately 30% of the time. Amber [name] states she is exercising 0 minutes 0 times per week.  Today's visit was #: 2 Starting weight: 176 lbs Starting date: 03/17/2020 Today's weight: 176 lbs Today's date: 03/31/2020 Total lbs lost to date: 0 Total lbs lost since last in-office visit: 0  Interim History: Amber [name]'s weight remains the same since her last visit. She did not get into the plan that much.  Subjective:   Prediabetes. Amber [name] has a diagnosis of prediabetes based on her elevated HgA1c and was informed this puts her at greater risk of developing diabetes. She continues to work on diet and exercise to decrease her risk of diabetes. She denies nausea or hypoglycemia. Amber [name] is on no medication. No polyphagia.  Lab Results  Component Value Date   HGBA1C 5.8 (H) 03/17/2020   Lab Results  Component Value Date   INSULIN 14.7 03/17/2020   Essential hypertension. Blood pressure is controlled.  BP Readings from Last 3 Encounters:  03/31/20 119/70  03/17/20 (!) 150/80  10/17/19 120/60   Lab Results  Component Value Date   CREATININE 0.56 (L) 03/17/2020   CREATININE 0.56 10/28/2019   CREATININE 0.58 10/17/2019   Assessment/Plan:   Prediabetes. [name] will continue to work on weight loss, increasing activities, and decreasing simple carbohydrates to help decrease the risk of diabetes.   Essential hypertension. [name] is working on healthy weight loss and exercise to improve blood pressure control. We will watch for signs of hypotension as she continues her lifestyle modifications. She will continue her medication as directed.   Class 1 obesity with serious comorbidity and body mass index (BMI) of 32.0 to 32.9 in adult, unspecified obesity  type.  [name] is currently in the action stage of change. As such, her goal is to continue with weight loss efforts. She has agreed to the Category 1 Plan.   She will work on meal planning, intentional eating, and will stop candy in the afternoon.  We reviewed with the patient labs from 03/17/2020 including CMP, lipids, Vitamin D, A1c, and insulin.  Exercise goals: Amber [name] will walk in the neighborhood/bike.  Behavioral modification strategies: increasing lean protein intake, decreasing simple carbohydrates, increasing vegetables, increasing water intake, decreasing eating out, no skipping meals, meal planning and cooking strategies, keeping healthy foods in the home and planning for success.  Amber [name] has agreed to follow-up with our clinic in 2 weeks. She was informed of the importance of frequent follow-up visits to maximize her success with intensive lifestyle modifications for her multiple health conditions.   Objective:   Blood pressure 119/70, pulse 80, temperature 97.6 F (36.4 C), height 5\' 2"  (1.575 m), weight 176 lb (79.8 kg), SpO2 98 %. Body mass index is 32.19 kg/m.  General: Cooperative, alert, well developed, in no acute distress. HEENT: Conjunctivae and lids unremarkable. Cardiovascular: Regular rhythm.  Lungs: Normal work of breathing. Neurologic: No focal deficits.   Lab Results  Component Value Date   CREATININE 0.56 (L) 03/17/2020   BUN 11 03/17/2020   NA 138 03/17/2020   K 4.0 03/17/2020   CL 96 03/17/2020   CO2 29 03/17/2020   Lab Results  Component Value Date   ALT 24 03/17/2020   AST 18 03/17/2020   ALKPHOS 90  03/17/2020   BILITOT 1.0 03/17/2020   Lab Results  Component Value Date   HGBA1C 5.8 (H) 03/17/2020   HGBA1C 5.7 (H) 02/20/2017   Lab Results  Component Value Date   INSULIN 14.7 03/17/2020   Lab Results  Component Value Date   TSH 0.73 10/17/2019   Lab Results  Component Value Date   CHOL 133 03/17/2020   HDL 53 03/17/2020   LDLCALC  59 03/17/2020   TRIG 119 03/17/2020   CHOLHDL 2 02/17/2019   Lab Results  Component Value Date   WBC 8.9 10/17/2019   HGB 13.5 10/17/2019   HCT 39.0 10/17/2019   MCV 92.3 10/17/2019   PLT 171.0 10/17/2019   Lab Results  Component Value Date   IRON 128 11/15/2018   TIBC 376 11/15/2018   FERRITIN 57 11/15/2018   Obesity Behavioral Intervention:   Approximately 15 minutes were spent on the discussion below.  ASK: We discussed the diagnosis of obesity with Amber [name] today and Amber [name] agreed to give [place] permission to discuss obesity behavioral modification therapy today.  ASSESS: Amber [name] has the diagnosis of obesity and her BMI today is 32.3. Amber [name] is in the action stage of change.   ADVISE: [name] was educated on the multiple health risks of obesity as well as the benefit of weight loss to improve her health. She was advised of the need for long term treatment and the importance of lifestyle modifications to improve her current health and to decrease her risk of future health problems.  AGREE: Multiple dietary modification options and treatment options were discussed and Amber [name] agreed to follow the recommendations documented in the above note.  ARRANGE: Amber [name] was educated on the importance of frequent visits to treat obesity as outlined per CMS and USPSTF guidelines and agreed to schedule her next follow up appointment today.  Attestation Statements:   Reviewed by clinician on day of visit: allergies, medications, problem list, medical history, surgical history, family history, social history, and previous encounter notes.  Amber [name], am acting as Energy manager for [employer], DO   I have reviewed the above documentation for accuracy and completeness, and I agree with the above. Amber Capra, DO

## 2020-04-01 ENCOUNTER — Encounter (INDEPENDENT_AMBULATORY_CARE_PROVIDER_SITE_OTHER): Payer: Self-pay | Admitting: Bariatrics

## 2020-04-14 ENCOUNTER — Ambulatory Visit (INDEPENDENT_AMBULATORY_CARE_PROVIDER_SITE_OTHER): Payer: Medicare Other | Admitting: Bariatrics

## 2020-04-14 ENCOUNTER — Encounter (INDEPENDENT_AMBULATORY_CARE_PROVIDER_SITE_OTHER): Payer: Self-pay | Admitting: Bariatrics

## 2020-04-14 ENCOUNTER — Other Ambulatory Visit: Payer: Self-pay

## 2020-04-14 VITALS — BP 126/71 | HR 76 | Temp 97.5°F | Ht 62.0 in | Wt 175.0 lb

## 2020-04-14 DIAGNOSIS — I10 Essential (primary) hypertension: Secondary | ICD-10-CM | POA: Diagnosis not present

## 2020-04-14 DIAGNOSIS — E669 Obesity, unspecified: Secondary | ICD-10-CM | POA: Diagnosis not present

## 2020-04-14 DIAGNOSIS — R7303 Prediabetes: Secondary | ICD-10-CM | POA: Diagnosis not present

## 2020-04-14 DIAGNOSIS — Z6832 Body mass index (BMI) 32.0-32.9, adult: Secondary | ICD-10-CM | POA: Diagnosis not present

## 2020-04-16 ENCOUNTER — Telehealth: Payer: Self-pay

## 2020-04-16 ENCOUNTER — Other Ambulatory Visit: Payer: Self-pay

## 2020-04-16 ENCOUNTER — Encounter: Payer: Self-pay | Admitting: Family Medicine

## 2020-04-16 ENCOUNTER — Ambulatory Visit (INDEPENDENT_AMBULATORY_CARE_PROVIDER_SITE_OTHER): Payer: Medicare Other | Admitting: Family Medicine

## 2020-04-16 VITALS — BP 119/64 | HR 76 | Temp 95.9°F | Ht 62.0 in | Wt 179.2 lb

## 2020-04-16 DIAGNOSIS — I10 Essential (primary) hypertension: Secondary | ICD-10-CM

## 2020-04-16 DIAGNOSIS — E038 Other specified hypothyroidism: Secondary | ICD-10-CM

## 2020-04-16 DIAGNOSIS — E7800 Pure hypercholesterolemia, unspecified: Secondary | ICD-10-CM | POA: Diagnosis not present

## 2020-04-16 DIAGNOSIS — R7303 Prediabetes: Secondary | ICD-10-CM

## 2020-04-16 DIAGNOSIS — H8112 Benign paroxysmal vertigo, left ear: Secondary | ICD-10-CM

## 2020-04-16 LAB — CBC WITH DIFFERENTIAL/PLATELET
Absolute Monocytes: 545 cells/uL (ref 200–950)
Basophils Absolute: 24 cells/uL (ref 0–200)
Basophils Relative: 0.3 %
Eosinophils Absolute: 71 cells/uL (ref 15–500)
Eosinophils Relative: 0.9 %
HCT: 37.9 % (ref 35.0–45.0)
Hemoglobin: 13 g/dL (ref 11.7–15.5)
Lymphs Abs: 1833 cells/uL (ref 850–3900)
MCH: 31.9 pg (ref 27.0–33.0)
MCHC: 34.3 g/dL (ref 32.0–36.0)
MCV: 93.1 fL (ref 80.0–100.0)
MPV: 9.7 fL (ref 7.5–12.5)
Monocytes Relative: 6.9 %
Neutro Abs: 5427 cells/uL (ref 1500–7800)
Neutrophils Relative %: 68.7 %
Platelets: 163 10*3/uL (ref 140–400)
RBC: 4.07 10*6/uL (ref 3.80–5.10)
RDW: 12.8 % (ref 11.0–15.0)
Total Lymphocyte: 23.2 %
WBC: 7.9 10*3/uL (ref 3.8–10.8)

## 2020-04-16 MED ORDER — LOSARTAN POTASSIUM-HCTZ 50-12.5 MG PO TABS
1.0000 | ORAL_TABLET | Freq: Every day | ORAL | 1 refills | Status: DC
Start: 2020-04-16 — End: 2020-09-27

## 2020-04-16 MED ORDER — MECLIZINE HCL 25 MG PO TABS
25.0000 mg | ORAL_TABLET | Freq: Two times a day (BID) | ORAL | 1 refills | Status: DC
Start: 2020-04-16 — End: 2021-03-15

## 2020-04-16 NOTE — Telephone Encounter (Signed)
[facility] to [facility]

## 2020-04-16 NOTE — Telephone Encounter (Signed)
..   LAST APPOINTMENT DATE: 04/16/2020   NEXT APPOINTMENT DATE:@Visit  date not found  MEDICATION:losartan-hydrochlorothiazide (HYZAAR) 50-12.5 MG tablet    PHARMACY: [facility] - [place], [place] - [address]

## 2020-04-16 NOTE — Patient Instructions (Addendum)
-  doing great! Labs just checked. Only need CBC. I put this order in.   - I sent in meclizine for you to take as needed for the dizziness. Also have some handout of exercises to do three times a day for the dizziness to help get that stone back in place.   -for itchy ears can use hydrocortisone cream bid on external part of ear.    Let me know if dizziness not improving, otherwise see you in 6 months! Dr. Artis Flock

## 2020-04-16 NOTE — Progress Notes (Signed)
Patient: Amber [name] MRN: [id number] DOB: 12/19/1940 PCP: Orland Mustard, MD     Subjective:  Chief Complaint  Patient presents with  . Hypertension  . Prediabetes  . Hypothyroidism  . Hyperlipidemia  . Dizziness    HPI: The patient is a 79 y.o. female who presents today for HTN. She complains of being swimmy headed (fuzziness), when she woke up this morning. She also complains of left ear dryness, she says that she has used sweet oil to relieve it. She does not know if that is the cause of the way she feels today.   Hypertension: Here for follow up of hypertension.  Currently on hctz 12.5mg /day and hyzaar 50-12.5mg /day. Takes medication as prescribed and denies any side effects. Exercise includes none. Weight has been stable. Denies any chest pain, headaches, shortness of breath, vision changes, swelling in lower extremities.   Prediabetes New diagnosis by [facility]. A1c checked and it was 5.8. not very impressive since almost 79 years of age. Discussed would not start medication in her at this point and would continue to work on diet/exercise.   Hyperlipidemia Labs just checked at [facility] Very well controlled and to goal. She is on lipitor 86m/day. LDL was less than 70 on recent lab draw.   Hx of BPPV She had an episode this AM. Feels more when she turns her head to the left side. Got up to go to the bathroom at 5:30AM and when she went to the bathroom she was dizzy and leaning to the right. She was staggering as well. She went back to bed and when she got up she had another episode. Feels okay now, more just swimming feeling. Doesn't have meclizine. Has not done home exercises. No nausea or vomiting.    Review of Systems  Constitutional: Negative for chills, fatigue and fever.  HENT: Negative for dental problem, ear pain, hearing loss and trouble swallowing.   Eyes: Negative for visual disturbance.  Respiratory: Negative for cough, chest tightness and shortness of breath.    Cardiovascular: Negative for chest pain, palpitations and leg swelling.  Gastrointestinal: Negative for abdominal pain, blood in stool, diarrhea and nausea.  Endocrine: Negative for cold intolerance, polydipsia, polyphagia and polyuria.  Genitourinary: Negative for dysuria and hematuria.  Musculoskeletal: Negative for arthralgias, neck pain and neck stiffness.  Skin: Negative for rash.  Neurological: Positive for dizziness. Negative for light-headedness and headaches.  Psychiatric/Behavioral: Negative for dysphoric mood and sleep disturbance. The patient is not nervous/anxious.     Allergies Patient is allergic to codeine, darvocet [propoxyphene n-acetaminophen], vicodin [hydrocodone-acetaminophen], nickel, and sulfa antibiotics.  Past Medical History Patient  has a past medical history of Arthritis, DVT (deep venous thrombosis) (HCC), Family history of blood clots, blood clots, Hypertension, Hypothyroidism, Left bundle branch block, Macular degeneration, Peripheral vascular disease (HCC), Post-phlebitic syndrome (04/18/2013), Rotator cuff rupture, complete (07/05/2011), and Varicose veins.  Surgical History Patient  has a past surgical history that includes Appendectomy (1979); Back surgery (2009); Shoulder open rotator cuff repair (07/05/2011); Endovenous ablation saphenous vein w/ laser (Right, 09/15/2013); Endovenous ablation saphenous vein w/ laser (Left, 10/16/2013); left open rotator cuff surgery  (2010 ); Total knee arthroplasty (Right, 09/09/2014); lumbar infusion (01/16/2008); and Partial hysterectomy (1979).  Family History Pateint's family history includes AAA (abdominal aortic aneurysm) in her brother and father; Cancer in her mother and sister; Deep vein thrombosis in her mother; Depression in her mother; Diabetes in her brother, mother, sister, sister, and sister; Heart disease in her father, mother, and sister; Heart  disease (age of onset: 30) in her sister; Hypertension in her  mother, sister, and sister; Obesity in her mother; Peripheral vascular disease in her brother; Varicose Veins in her mother and sister; Vision loss in her brother, sister, sister, and sister.  Social History Patient  reports that she quit smoking about 55 years ago. Her smoking use included cigarettes. She has a 3.25 pack-year smoking history. She has never used smokeless tobacco. She reports that she does not drink alcohol and does not use drugs.    Objective: Vitals:   04/16/20 1042  BP: 119/64  Pulse: 76  Temp: (!) 95.9 F (35.5 C)  TempSrc: Temporal  SpO2: 99%  Weight: 179 lb 3.2 oz (81.3 kg)  Height: 5\' 2"  (1.575 m)    Body mass index is 32.78 kg/m.  Physical Exam Vitals reviewed.  Constitutional:      Appearance: Normal appearance. She is well-developed.  HENT:     Head: Normocephalic and atraumatic.     Right Ear: Tympanic membrane, ear canal and external ear normal.     Left Ear: Tympanic membrane, ear canal and external ear normal.     Nose: Nose normal.     Mouth/Throat:     Mouth: Mucous membranes are moist.  Eyes:     Extraocular Movements: Extraocular movements intact.     Conjunctiva/sclera: Conjunctivae normal.     Pupils: Pupils are equal, round, and reactive to light.  Neck:     Thyroid: No thyromegaly.  Cardiovascular:     Rate and Rhythm: Normal rate and regular rhythm.     Heart sounds: Normal heart sounds. No murmur heard.   Pulmonary:     Effort: Pulmonary effort is normal.     Breath sounds: Normal breath sounds.  Abdominal:     General: Bowel sounds are normal. There is no distension.     Palpations: Abdomen is soft.     Tenderness: There is no abdominal tenderness.  Musculoskeletal:     Cervical back: Normal range of motion and neck supple.  Lymphadenopathy:     Cervical: No cervical adenopathy.  Skin:    General: Skin is warm and dry.     Findings: No rash.  Neurological:     General: No focal deficit present.     Mental Status: She  is alert and oriented to person, place, and time.     Cranial Nerves: No cranial nerve deficit.     Coordination: Coordination normal.     Deep Tendon Reflexes: Reflexes normal.     Comments: +dixhalpike to the left   Psychiatric:        Mood and Affect: Mood normal.        Behavior: Behavior normal.        Assessment/plan: 1. Primary hypertension Blood pressure is to goal. Continue current anti-hypertensive medications: hctz 12.5mg /day and hyzaar daily. Refills not given and routine lab work will be done today. (only cbc).  Recommended routine exercise and healthy diet including DASH diet and mediterranean diet. Encouraged weight loss. F/u in 6 months. Only needs cbc, other labs reviewed.    2. Other specified hypothyroidism To goal, labs reviewed   3. Hypercholesterolemia Very good. ldl less than 70. Continue medication. No refills needed.   4. Prediabetes Will monitor q 6-12 months, but no medication needed at this point. Really work on diet and exercise.   5. BPPV (benign paroxysmal positional vertigo), left Home exercises TID and meclizine prn. Fall precuations given. If not getting better  in 1-2 weeks she is to let me know.    This visit occurred during the SARS-CoV-2 public health emergency.  Safety protocols were in place, including screening questions prior to the visit, additional usage of staff PPE, and extensive cleaning of exam room while observing appropriate contact time as indicated for disinfecting solutions.     Return in about 6 months (around 10/14/2020).    Orland Mustard, MD [facility]   04/16/2020

## 2020-04-19 ENCOUNTER — Ambulatory Visit: Payer: Medicare Other | Admitting: Family Medicine

## 2020-04-19 ENCOUNTER — Encounter (INDEPENDENT_AMBULATORY_CARE_PROVIDER_SITE_OTHER): Payer: Self-pay | Admitting: Bariatrics

## 2020-04-19 NOTE — Progress Notes (Signed)
Chief Complaint:   OBESITY Amber [name] is here to discuss her progress with her obesity treatment plan along with follow-up of her obesity related diagnoses. Amber [name] is on the Category 1 Plan and states she is following her eating plan approximately 90% of the time. Amber [name] states she is walking 20 minutes 2 times per week.  Today's visit was #: 3 Starting weight: 176 lbs Starting date: 03/17/2020 Today's weight: 175 lbs Today's date: 04/14/2020 Total lbs lost to date: 1 Total lbs lost since last in-office visit: 1  Interim History: Amber [name] is down 1 lb since her last visit. She states she is going to [place] for Thanksgiving.  Subjective:   Essential hypertension. Blood pressure is controlled.  BP Readings from Last 3 Encounters:  04/16/20 119/64  04/14/20 126/71  03/31/20 119/70   Lab Results  Component Value Date   CREATININE 0.56 (L) 03/17/2020   CREATININE 0.56 10/28/2019   CREATININE 0.58 10/17/2019   Prediabetes. Amber [name] has a diagnosis of prediabetes based on her elevated HgA1c and was informed this puts her at greater risk of developing diabetes. She continues to work on diet and exercise to decrease her risk of diabetes. She denies nausea or hypoglycemia. Amber [name] is on no medication.  Lab Results  Component Value Date   HGBA1C 5.8 (H) 03/17/2020   Lab Results  Component Value Date   INSULIN 14.7 03/17/2020   Assessment/Plan:   Essential hypertension. Amber [name] is working on healthy weight loss and exercise to improve blood pressure control. We will watch for signs of hypotension as she continues her lifestyle modifications. She will continue her medication as directed.   Prediabetes. Amber [name] will continue to work on weight loss, continue walking, and decreasing refined carbohydrates and sweets to help decrease the risk of diabetes.   Class 1 obesity with serious comorbidity and body mass index (BMI) of 32.0 to 32.9 in adult, unspecified obesity type.  Amber [name] is  currently in the action stage of change. As such, her goal is to continue with weight loss efforts. She has agreed to the Category 1 Plan.   She will work on meal planning, intentional eating, and increasing her protein intake.   Exercise goals: Amber [name] will continue to walk while on vacation.  Behavioral modification strategies: increasing lean protein intake, decreasing simple carbohydrates, increasing vegetables, increasing water intake, decreasing eating out, no skipping meals, meal planning and cooking strategies, keeping healthy foods in the home and planning for success.  Amber [name] has agreed to follow-up with our clinic in 4 weeks. She was informed of the importance of frequent follow-up visits to maximize her success with intensive lifestyle modifications for her multiple health conditions.   Objective:   Blood pressure 126/71, pulse 76, temperature (!) 97.5 F (36.4 C), height 5\' 2"  (1.575 m), weight 175 lb (79.4 kg), SpO2 97 %. Body mass index is 32.01 kg/m.  General: Cooperative, alert, well developed, in no acute distress. HEENT: Conjunctivae and lids unremarkable. Cardiovascular: Regular rhythm.  Lungs: Normal work of breathing. Neurologic: No focal deficits.   Lab Results  Component Value Date   CREATININE 0.56 (L) 03/17/2020   BUN 11 03/17/2020   NA 138 03/17/2020   K 4.0 03/17/2020   CL 96 03/17/2020   CO2 29 03/17/2020   Lab Results  Component Value Date   ALT 24 03/17/2020   AST 18 03/17/2020   ALKPHOS 90 03/17/2020   BILITOT 1.0 03/17/2020   Lab Results  Component Value Date  HGBA1C 5.8 (H) 03/17/2020   HGBA1C 5.7 (H) 02/20/2017   Lab Results  Component Value Date   INSULIN 14.7 03/17/2020   Lab Results  Component Value Date   TSH 0.73 10/17/2019   Lab Results  Component Value Date   CHOL 133 03/17/2020   HDL 53 03/17/2020   LDLCALC 59 03/17/2020   TRIG 119 03/17/2020   CHOLHDL 2 02/17/2019   Lab Results  Component Value Date   IRON 128  11/15/2018   TIBC 376 11/15/2018   FERRITIN 57 11/15/2018   Obesity Behavioral Intervention:   Approximately 15 minutes were spent on the discussion below.  ASK: We discussed the diagnosis of obesity with Amber [name] today and Amber [name] agreed to give [place] permission to discuss obesity behavioral modification therapy today.  ASSESS: Amber [name] has the diagnosis of obesity and her BMI today is 32.0. Amber [name] is in the action stage of change.   ADVISE: Amber [name] was educated on the multiple health risks of obesity as well as the benefit of weight loss to improve her health. She was advised of the need for long term treatment and the importance of lifestyle modifications to improve her current health and to decrease her risk of future health problems.  AGREE: Multiple dietary modification options and treatment options were discussed and Amber [name] agreed to follow the recommendations documented in the above note.  ARRANGE: Amber [name] was educated on the importance of frequent visits to treat obesity as outlined per CMS and USPSTF guidelines and agreed to schedule her next follow up appointment today.  Attestation Statements:   Reviewed by clinician on day of visit: allergies, medications, problem list, medical history, surgical history, family history, social history, and previous encounter notes.  Fernanda Drum, am acting as Energy manager for [employer], DO   I have reviewed the above documentation for accuracy and completeness, and I agree with the above. Corinna Capra, DO

## 2020-04-26 ENCOUNTER — Other Ambulatory Visit: Payer: Self-pay | Admitting: Family Medicine

## 2020-04-26 DIAGNOSIS — I10 Essential (primary) hypertension: Secondary | ICD-10-CM

## 2020-05-10 ENCOUNTER — Ambulatory Visit (INDEPENDENT_AMBULATORY_CARE_PROVIDER_SITE_OTHER): Payer: Medicare Other | Admitting: Bariatrics

## 2020-05-10 ENCOUNTER — Other Ambulatory Visit: Payer: Self-pay

## 2020-05-10 ENCOUNTER — Encounter (INDEPENDENT_AMBULATORY_CARE_PROVIDER_SITE_OTHER): Payer: Self-pay | Admitting: Bariatrics

## 2020-05-10 VITALS — BP 128/76 | HR 76 | Temp 97.6°F | Ht 62.0 in | Wt 172.0 lb

## 2020-05-10 DIAGNOSIS — E7849 Other hyperlipidemia: Secondary | ICD-10-CM

## 2020-05-10 DIAGNOSIS — E669 Obesity, unspecified: Secondary | ICD-10-CM

## 2020-05-10 DIAGNOSIS — Z6831 Body mass index (BMI) 31.0-31.9, adult: Secondary | ICD-10-CM | POA: Diagnosis not present

## 2020-05-10 DIAGNOSIS — E038 Other specified hypothyroidism: Secondary | ICD-10-CM

## 2020-05-10 NOTE — Progress Notes (Signed)
Chief Complaint:   OBESITY Amber [name] is here to discuss her progress with her obesity treatment plan along with follow-up of her obesity related diagnoses. Amber [name] is on the Category 1 Plan and states she is following her eating plan approximately 60% of the time. Amber [name] states she is exercising 0 minutes 0 times per week.  Today's visit was #: 4 Starting weight: 176 lbs Starting date: 03/17/2020 Today's weight: 172 lbs Today's date: 05/10/2020 Total lbs lost to date: 4 Total lbs lost since last in-office visit: 3  Interim History: Amber [name] is down 3 lbs since her last visit. She reports doing well with her water and protein intake and states she has been cooking more at home.  Subjective:   Other hyperlipidemia. Amber [name] is taking Lipitor.   Lab Results  Component Value Date   CHOL 133 03/17/2020   HDL 53 03/17/2020   LDLCALC 59 03/17/2020   TRIG 119 03/17/2020   CHOLHDL 2 02/17/2019   Lab Results  Component Value Date   ALT 24 03/17/2020   AST 18 03/17/2020   ALKPHOS 90 03/17/2020   BILITOT 1.0 03/17/2020   The ASCVD Risk score Denman George DC Jr., et al., 2013) failed to calculate for the following reasons:   The 2013 ASCVD risk score is only valid for ages 71 to 86  Other specified hypothyroidism. Amber [name] is taking Synthroid.   Lab Results  Component Value Date   TSH 0.73 10/17/2019   Assessment/Plan:   Other hyperlipidemia. Cardiovascular risk and specific lipid/LDL goals reviewed.  We discussed several lifestyle modifications today and Rionna will continue to work on diet, exercise and weight loss efforts. Orders and follow up as documented in patient record. She will continue Lipitor as directed.   Counseling Intensive lifestyle modifications are the first line treatment for this issue. . Dietary changes: Increase soluble fiber. Decrease simple carbohydrates. . Exercise changes: Moderate to vigorous-intensity aerobic activity 150 minutes per week if  tolerated. . Lipid-lowering medications: see documented in medical record.  Other specified hypothyroidism. Patient with long-standing hypothyroidism, on levothyroxine therapy. She appears euthyroid. Orders and follow up as documented in patient record. Amber [name] will continue Synthroid as directed.   Counseling . Good thyroid control is important for overall health. Supratherapeutic thyroid levels are dangerous and will not improve weight loss results. . The correct way to take levothyroxine is fasting, with water, separated by at least 30 minutes from breakfast, and separated by more than 4 hours from calcium, iron, multivitamins, acid reflux medications (PPIs).   Class 1 obesity with serious comorbidity and body mass index (BMI) of 31.0 to 31.9 in adult, unspecified obesity type.  Amber [name] is currently in the action stage of change. As such, her goal is to continue with weight loss efforts. She has agreed to the Category 1 Plan.   She will work on meal planning and intentional eating.   Exercise goals: Amber [name] will consider using her exercise bike and remain active.  Behavioral modification strategies: increasing lean protein intake, decreasing simple carbohydrates, increasing vegetables, increasing water intake, decreasing eating out, no skipping meals, meal planning and cooking strategies, keeping healthy foods in the home, ways to avoid night time snacking, better snacking choices, emotional eating strategies and planning for success.  Amber [name] has agreed to follow-up with our clinic in 3 weeks. She was informed of the importance of frequent follow-up visits to maximize her success with intensive lifestyle modifications for her multiple health conditions.   Objective:   Blood  pressure 128/76, pulse 76, temperature 97.6 F (36.4 C), height 5\' 2"  (1.575 m), weight 172 lb (78 kg). Body mass index is 31.46 kg/m.  General: Cooperative, alert, well developed, in no acute distress. HEENT:  Conjunctivae and lids unremarkable. Cardiovascular: Regular rhythm.  Lungs: Normal work of breathing. Neurologic: No focal deficits.   Lab Results  Component Value Date   CREATININE 0.56 (L) 03/17/2020   BUN 11 03/17/2020   NA 138 03/17/2020   K 4.0 03/17/2020   CL 96 03/17/2020   CO2 29 03/17/2020   Lab Results  Component Value Date   ALT 24 03/17/2020   AST 18 03/17/2020   ALKPHOS 90 03/17/2020   BILITOT 1.0 03/17/2020   Lab Results  Component Value Date   HGBA1C 5.8 (H) 03/17/2020   HGBA1C 5.7 (H) 02/20/2017   Lab Results  Component Value Date   INSULIN 14.7 03/17/2020   Lab Results  Component Value Date   TSH 0.73 10/17/2019   Lab Results  Component Value Date   CHOL 133 03/17/2020   HDL 53 03/17/2020   LDLCALC 59 03/17/2020   TRIG 119 03/17/2020   CHOLHDL 2 02/17/2019   Lab Results  Component Value Date   WBC 7.9 04/16/2020   HGB 13.0 04/16/2020   HCT 37.9 04/16/2020   MCV 93.1 04/16/2020   PLT 163 04/16/2020   Lab Results  Component Value Date   IRON 128 11/15/2018   TIBC 376 11/15/2018   FERRITIN 57 11/15/2018   Obesity Behavioral Intervention:   Approximately 15 minutes were spent on the discussion below.  ASK: We discussed the diagnosis of obesity with Amber [name] today and Amber [name] agreed to give 11/17/2018 permission to discuss obesity behavioral modification therapy today.  ASSESS: Amber [name] has the diagnosis of obesity and her BMI today is 31.5. Amber [name] is in the action stage of change.   ADVISE: Amber [name] was educated on the multiple health risks of obesity as well as the benefit of weight loss to improve her health. She was advised of the need for long term treatment and the importance of lifestyle modifications to improve her current health and to decrease her risk of future health problems.  AGREE: Multiple dietary modification options and treatment options were discussed and Amber [name] agreed to follow the recommendations documented in the above  note.  ARRANGE: Amber [name] was educated on the importance of frequent visits to treat obesity as outlined per CMS and USPSTF guidelines and agreed to schedule her next follow up appointment today.  Attestation Statements:   Reviewed by clinician on day of visit: allergies, medications, problem list, medical history, surgical history, family history, social history, and previous encounter notes.  Lura Em, am acting as Fernanda Drum for Energy manager, DO   I have reviewed the above documentation for accuracy and completeness, and I agree with the above. [employer], DO

## 2020-06-02 ENCOUNTER — Ambulatory Visit (INDEPENDENT_AMBULATORY_CARE_PROVIDER_SITE_OTHER): Payer: Medicare Other | Admitting: Bariatrics

## 2020-06-02 ENCOUNTER — Encounter (INDEPENDENT_AMBULATORY_CARE_PROVIDER_SITE_OTHER): Payer: Self-pay | Admitting: Bariatrics

## 2020-06-02 ENCOUNTER — Other Ambulatory Visit: Payer: Self-pay

## 2020-06-02 VITALS — BP 130/85 | HR 72 | Temp 97.5°F | Ht 62.0 in | Wt 172.0 lb

## 2020-06-02 DIAGNOSIS — E7800 Pure hypercholesterolemia, unspecified: Secondary | ICD-10-CM

## 2020-06-02 DIAGNOSIS — E038 Other specified hypothyroidism: Secondary | ICD-10-CM

## 2020-06-02 DIAGNOSIS — Z6831 Body mass index (BMI) 31.0-31.9, adult: Secondary | ICD-10-CM | POA: Diagnosis not present

## 2020-06-02 DIAGNOSIS — E669 Obesity, unspecified: Secondary | ICD-10-CM

## 2020-06-03 ENCOUNTER — Encounter (INDEPENDENT_AMBULATORY_CARE_PROVIDER_SITE_OTHER): Payer: Self-pay | Admitting: Bariatrics

## 2020-06-03 NOTE — Progress Notes (Signed)
Chief Complaint:   OBESITY Trenace is here to discuss her progress with her obesity treatment plan along with follow-up of her obesity related diagnoses. Kenyette is on the Category 1 Plan and states she is following her eating plan approximately 30% of the time. Devita states she is not exercising regularly at this time.  Today's visit was #: 5 Starting weight: 176 lbs Starting date: 03/17/2020 Today's weight: 172 lbs Today's date: 06/02/2020 Total lbs lost to date: 4 lbs Total lbs lost since last in-office visit: 0  Interim History: Kailoni's weight remained the same over the holidays.  She did well over the holidays.  She is doing well with her water.  Subjective:   1. Hypercholesterolemia Elyana has hypercholesterolemia and has been trying to improve her cholesterol levels with intensive lifestyle modification including a low saturated fat diet, exercise and weight loss. She denies any chest pain, claudication or myalgias.  She is taking Lipitor.  Lab Results  Component Value Date   ALT 24 03/17/2020   AST 18 03/17/2020   ALKPHOS 90 03/17/2020   BILITOT 1.0 03/17/2020   Lab Results  Component Value Date   CHOL 133 03/17/2020   HDL 53 03/17/2020   LDLCALC 59 03/17/2020   TRIG 119 03/17/2020   CHOLHDL 2 02/17/2019   2. Other specified hypothyroidism Controlled.  Christinna is taking Synthroid 50 mcg daily.  Lab Results  Component Value Date   TSH 0.73 10/17/2019   Assessment/Plan:   1. Hypercholesterolemia Cardiovascular risk and specific lipid/LDL goals reviewed.  We discussed several lifestyle modifications today and Xin will continue to work on diet, exercise and weight loss efforts. Orders and follow up as documented in patient record.  Continue Lipitor.  Counseling Intensive lifestyle modifications are the first line treatment for this issue. . Dietary changes: Increase soluble fiber. Decrease simple carbohydrates. . Exercise changes: Moderate to vigorous-intensity  aerobic activity 150 minutes per week if tolerated. . Lipid-lowering medications: see documented in medical record.  2. Other specified hypothyroidism Patient with long-standing hypothyroidism, on levothyroxine therapy. She appears euthyroid. Orders and follow up as documented in patient record.  Continue Synthroid.  Counseling . Good thyroid control is important for overall health. Supratherapeutic thyroid levels are dangerous and will not improve weight loss results. . The correct way to take levothyroxine is fasting, with water, separated by at least 30 minutes from breakfast, and separated by more than 4 hours from calcium, iron, multivitamins, acid reflux medications (PPIs).   3. Class 1 obesity with serious comorbidity and body mass index (BMI) of 31.0 to 31.9 in adult, unspecified obesity type  Rorie is currently in the action stage of change. As such, her goal is to continue with weight loss efforts. She has agreed to the Category 1 Plan.   She will work on meal planning, intentional eating, and will be adherent to the meal plan.  Exercise goals: All adults should avoid inactivity. Some physical activity is better than none, and adults who participate in any amount of physical activity gain some health benefits. She will be more active.  She will bike at home.  Behavioral modification strategies: increasing lean protein intake, decreasing simple carbohydrates, increasing vegetables, increasing water intake, decreasing eating out, no skipping meals, meal planning and cooking strategies, keeping healthy foods in the home and planning for success.  Voncile has agreed to follow-up with our clinic in 2-3 weeks. She was informed of the importance of frequent follow-up visits to maximize her success with  intensive lifestyle modifications for her multiple health conditions.   Objective:   Blood pressure 130/85, pulse 72, temperature (!) 97.5 F (36.4 C), temperature source Other (Comment),  height 5\' 2"  (1.575 m), weight 172 lb (78 kg), SpO2 96 %. Body mass index is 31.46 kg/m.  General: Cooperative, alert, well developed, in no acute distress. HEENT: Conjunctivae and lids unremarkable. Cardiovascular: Regular rhythm.  Lungs: Normal work of breathing. Neurologic: No focal deficits.   Lab Results  Component Value Date   CREATININE 0.56 (L) 03/17/2020   BUN 11 03/17/2020   NA 138 03/17/2020   K 4.0 03/17/2020   CL 96 03/17/2020   CO2 29 03/17/2020   Lab Results  Component Value Date   ALT 24 03/17/2020   AST 18 03/17/2020   ALKPHOS 90 03/17/2020   BILITOT 1.0 03/17/2020   Lab Results  Component Value Date   HGBA1C 5.8 (H) 03/17/2020   HGBA1C 5.7 (H) 02/20/2017   Lab Results  Component Value Date   INSULIN 14.7 03/17/2020   Lab Results  Component Value Date   TSH 0.73 10/17/2019   Lab Results  Component Value Date   CHOL 133 03/17/2020   HDL 53 03/17/2020   LDLCALC 59 03/17/2020   TRIG 119 03/17/2020   CHOLHDL 2 02/17/2019   Lab Results  Component Value Date   WBC 7.9 04/16/2020   HGB 13.0 04/16/2020   HCT 37.9 04/16/2020   MCV 93.1 04/16/2020   PLT 163 04/16/2020   Lab Results  Component Value Date   IRON 128 11/15/2018   TIBC 376 11/15/2018   FERRITIN 57 11/15/2018   Obesity Behavioral Intervention:   Approximately 15 minutes were spent on the discussion below.  ASK: We discussed the diagnosis of obesity with Latriece today and Kathleene agreed to give 11/17/2018 permission to discuss obesity behavioral modification therapy today.  ASSESS: Zenovia has the diagnosis of obesity and her BMI today is 31.6. Tressy is in the action stage of change.   ADVISE: Alison was educated on the multiple health risks of obesity as well as the benefit of weight loss to improve her health. She was advised of the need for long term treatment and the importance of lifestyle modifications to improve her current health and to decrease her risk of future health  problems.  AGREE: Multiple dietary modification options and treatment options were discussed and Karalyne agreed to follow the recommendations documented in the above note.  ARRANGE: Byanca was educated on the importance of frequent visits to treat obesity as outlined per CMS and USPSTF guidelines and agreed to schedule her next follow up appointment today.  Attestation Statements:   Reviewed by clinician on day of visit: allergies, medications, problem list, medical history, surgical history, family history, social history, and previous encounter notes.  I, Lura Em, CMA, am acting as Insurance claims handler for Energy manager, DO  I have reviewed the above documentation for accuracy and completeness, and I agree with the above. [employer], DO

## 2020-06-07 ENCOUNTER — Other Ambulatory Visit: Payer: Medicare Other

## 2020-06-07 DIAGNOSIS — Z20822 Contact with and (suspected) exposure to covid-19: Secondary | ICD-10-CM

## 2020-06-09 LAB — NOVEL CORONAVIRUS, NAA: SARS-CoV-2, NAA: NOT DETECTED

## 2020-06-09 LAB — SARS-COV-2, NAA 2 DAY TAT

## 2020-06-17 ENCOUNTER — Ambulatory Visit (INDEPENDENT_AMBULATORY_CARE_PROVIDER_SITE_OTHER): Payer: Medicare Other | Admitting: Bariatrics

## 2020-06-28 ENCOUNTER — Ambulatory Visit (INDEPENDENT_AMBULATORY_CARE_PROVIDER_SITE_OTHER): Payer: Medicare Other | Admitting: Bariatrics

## 2020-08-23 ENCOUNTER — Telehealth: Payer: Self-pay

## 2020-08-23 NOTE — Telephone Encounter (Signed)
Error

## 2020-08-26 ENCOUNTER — Ambulatory Visit (INDEPENDENT_AMBULATORY_CARE_PROVIDER_SITE_OTHER): Payer: Medicare Other | Admitting: Family Medicine

## 2020-08-26 ENCOUNTER — Other Ambulatory Visit: Payer: Self-pay

## 2020-08-26 ENCOUNTER — Encounter: Payer: Self-pay | Admitting: Family Medicine

## 2020-08-26 VITALS — BP 124/58 | HR 88 | Temp 97.3°F | Ht 62.0 in | Wt 181.0 lb

## 2020-08-26 DIAGNOSIS — K118 Other diseases of salivary glands: Secondary | ICD-10-CM | POA: Diagnosis not present

## 2020-08-26 DIAGNOSIS — M21611 Bunion of right foot: Secondary | ICD-10-CM | POA: Diagnosis not present

## 2020-08-26 DIAGNOSIS — L820 Inflamed seborrheic keratosis: Secondary | ICD-10-CM | POA: Diagnosis not present

## 2020-08-26 NOTE — Progress Notes (Signed)
Patient: Amber [name] MRN: [id number] DOB: Aug 14, 1940 PCP: Orland Mustard, MD     Subjective:  Chief Complaint  Patient presents with  . Referral    Podiatry and Dermatology.  . skin lesion  . enlarged gland    HPI: The patient is a 80 y.o. female who presents today for a referral to Podiatry, and possibly Dermatology. She would also like to dicuss neck xray, because she is still having pain in her neck at times.    Skin issues.  She has a large, scabby SK on her back. She states it irritates her and she can fill it lying down. Right to the left of this area it is sore. She can feel the soreness sitting, or she is more aware of it. It is more aggravating than anything. She first noticed it awhile ago. It hasn't really changed in intensity. She has had shingles, but not in this area.   Would like a referral to a podiatrist as she has a bunion and has OA in her feet that are giving her more problems. She has had this for a long time.   She also continues to have pain in her right submandibular galnd. CT neck and head was normal variant finding of this area. She would like to review the CT scan with me.    She is also needing a handicap placard. Uses cane to walk, can not walk > 200 feet without rest and has osteoarthritis.   Review of Systems  Constitutional: Negative for chills, fatigue and fever.  Musculoskeletal: Positive for arthralgias.  Skin:       Skin lesion on back     Allergies Patient is allergic to codeine, darvocet [propoxyphene n-acetaminophen], vicodin [hydrocodone-acetaminophen], nickel, and sulfa antibiotics.  Past Medical History Patient  has a past medical history of Arthritis, DVT (deep venous thrombosis) (HCC), Family history of blood clots, blood clots, Hypertension, Hypothyroidism, Left bundle branch block, Macular degeneration, Peripheral vascular disease (HCC), Post-phlebitic syndrome (04/18/2013), Rotator cuff rupture, complete (07/05/2011), and Varicose  veins.  Surgical History Patient  has a past surgical history that includes Appendectomy (1979); Back surgery (2009); Shoulder open rotator cuff repair (07/05/2011); Endovenous ablation saphenous vein w/ laser (Right, 09/15/2013); Endovenous ablation saphenous vein w/ laser (Left, 10/16/2013); left open rotator cuff surgery  (2010 ); Total knee arthroplasty (Right, 09/09/2014); lumbar infusion (01/16/2008); and Partial hysterectomy (1979).  Family History Pateint's family history includes AAA (abdominal aortic aneurysm) in her brother and father; Cancer in her mother and sister; Deep vein thrombosis in her mother; Depression in her mother; Diabetes in her brother, mother, sister, sister, and sister; Heart disease in her father, mother, and sister; Heart disease (age of onset: 32) in her sister; Hypertension in her mother, sister, and sister; Obesity in her mother; Peripheral vascular disease in her brother; Varicose Veins in her mother and sister; Vision loss in her brother, sister, sister, and sister.  Social History Patient  reports that she quit smoking about 56 years ago. Her smoking use included cigarettes. She has a 3.25 pack-year smoking history. She has never used smokeless tobacco. She reports that she does not drink alcohol and does not use drugs.    Objective: Vitals:   08/26/20 1121  BP: (!) 124/58  Pulse: 88  Temp: (!) 97.3 F (36.3 C)  TempSrc: Temporal  SpO2: 99%  Weight: 181 lb (82.1 kg)  Height: 5\' 2"  (1.575 m)    Body mass index is 33.11 kg/m.  Physical Exam Vitals reviewed.  Constitutional:      Appearance: Normal appearance. She is obese.  HENT:     Head: Normocephalic and atraumatic.  Pulmonary:     Effort: Pulmonary effort is normal.  Musculoskeletal:     Cervical back: Normal range of motion and neck supple.  Lymphadenopathy:     Cervical: No cervical adenopathy.  Skin:    General: Skin is warm.     Capillary Refill: Capillary refill takes less than 2  seconds.     Findings: Lesion (large, scabby stuck on dark lesion on center of back. tenderness just left adjacent to this. ) present.  Neurological:     General: No focal deficit present.     Mental Status: She is alert and oriented to person, place, and time.  Psychiatric:        Mood and Affect: Mood normal.        Behavior: Behavior normal.      Procedure note Verbal consent obtained for cryotherapy to irritated SK on her back. Risks discussed including need for multiple freezes, blistering, pain. 2 light cycles of freeze were done for 10 seconds. Tolerated well with no issues. Wound care given.   Assessment/plan: 1. Seborrheic keratosis, inflamed Cryotherapy done today. Unsure if this is related to pain she is feeling just adjacent to this area. We will see if it improves post cryo. Large sk, discussed may need multiple rounds of cryo. F/u as needed.   2. Bunion of right foot  - Ambulatory referral to Podiatry  3. Submandibular gland tenderness Reviewed CT with her. Normal finding, just lower compared to the left side. Reassured her on imaging. Was seen by ent.   -handicap placard filled out for her.   This visit occurred during the SARS-CoV-2 public health emergency.  Safety protocols were in place, including screening questions prior to the visit, additional usage of staff PPE, and extensive cleaning of exam room while observing appropriate contact time as indicated for disinfecting solutions.      Return if symptoms worsen or fail to improve.   Orland Mustard, MD [facility]   08/26/2020

## 2020-09-06 ENCOUNTER — Other Ambulatory Visit: Payer: Self-pay

## 2020-09-06 ENCOUNTER — Ambulatory Visit: Payer: Medicare Other | Admitting: Podiatry

## 2020-09-06 ENCOUNTER — Ambulatory Visit (INDEPENDENT_AMBULATORY_CARE_PROVIDER_SITE_OTHER): Payer: Medicare Other | Admitting: Podiatry

## 2020-09-06 ENCOUNTER — Encounter: Payer: Self-pay | Admitting: Podiatry

## 2020-09-06 ENCOUNTER — Ambulatory Visit (INDEPENDENT_AMBULATORY_CARE_PROVIDER_SITE_OTHER): Payer: Medicare Other

## 2020-09-06 DIAGNOSIS — M21612 Bunion of left foot: Secondary | ICD-10-CM

## 2020-09-06 DIAGNOSIS — M2042 Other hammer toe(s) (acquired), left foot: Secondary | ICD-10-CM

## 2020-09-06 DIAGNOSIS — M2041 Other hammer toe(s) (acquired), right foot: Secondary | ICD-10-CM

## 2020-09-06 DIAGNOSIS — M21611 Bunion of right foot: Secondary | ICD-10-CM | POA: Diagnosis not present

## 2020-09-06 DIAGNOSIS — M778 Other enthesopathies, not elsewhere classified: Secondary | ICD-10-CM

## 2020-09-06 DIAGNOSIS — M779 Enthesopathy, unspecified: Secondary | ICD-10-CM | POA: Diagnosis not present

## 2020-09-06 DIAGNOSIS — M21619 Bunion of unspecified foot: Secondary | ICD-10-CM | POA: Diagnosis not present

## 2020-09-06 MED ORDER — TRIAMCINOLONE ACETONIDE 10 MG/ML IJ SUSP
10.0000 mg | Freq: Once | INTRAMUSCULAR | Status: AC
  Administered 2020-09-06: 10 mg

## 2020-09-07 NOTE — Progress Notes (Signed)
Subjective:   Patient ID: Amber [name], female   DOB: 80 y.o.   MRN: [id number]   HPI patient presents stating she has had a history of bunion deformity right over left and she is wondering about correction and has developed a lot of pain in her forefoot right which is made it hard for her to be active or walk.  States it is become more of an issue for her and it is becoming increasingly difficult for ambulation and she has trouble with shoe gear secondary to the structural deformity she has   Review of Systems  All other systems reviewed and are negative.       Objective:  Physical Exam Vitals and nursing note reviewed.  Constitutional:      Appearance: She is well-developed.  Pulmonary:     Effort: Pulmonary effort is normal.  Musculoskeletal:        General: Normal range of motion.  Skin:    General: Skin is warm.  Neurological:     Mental Status: She is alert.     Neurovascular status intact muscle strength found to be adequate range of motion adequate.  Patient is found to have redness around the first metatarsal head right over left with pain with palpation and difficulty with certain types of shoes with inflammation and pain around the second MPJ right with fluid around the joint surface.  Patient is found to have good digital perfusion well oriented x3     Assessment:  Acute capsulitis of the second MPJ right along with structural bunion deformity right over left     Plan:  H&P reviewed conditions and discussed treatment options.  I do think we could consider a distal osteotomy for this and I educated her on this and even though she is a little bit hesitant due to age I do think she would do well and she will consider it for later in the year.  Today I went ahead did a proximal block of the right forefoot and I then went ahead and did sterile prep and aspirated the second MPJ getting out a small amount of clear fluid injected quarter cc dexamethasone Kenalog and advised  the patient on the utilization of padding therapy rigid bottom shoes  X-rays indicate that there is structural bunion deformity right and there is no indications fracture or arthritis of the second MPJ

## 2020-09-16 ENCOUNTER — Other Ambulatory Visit: Payer: Self-pay

## 2020-09-16 ENCOUNTER — Telehealth: Payer: Self-pay

## 2020-09-16 ENCOUNTER — Encounter: Payer: Self-pay | Admitting: Family Medicine

## 2020-09-16 ENCOUNTER — Ambulatory Visit (INDEPENDENT_AMBULATORY_CARE_PROVIDER_SITE_OTHER): Payer: Medicare Other | Admitting: Family Medicine

## 2020-09-16 VITALS — BP 124/70 | HR 78 | Temp 97.8°F | Ht 62.0 in | Wt 175.4 lb

## 2020-09-16 DIAGNOSIS — L820 Inflamed seborrheic keratosis: Secondary | ICD-10-CM

## 2020-09-16 NOTE — Progress Notes (Signed)
Patient: Amber [name] MRN: [id number] DOB: Sep 25, 1940 PCP: Orland Mustard, MD     Subjective:  Chief Complaint  Patient presents with  . Nevus    Follow up; Located on her back.    HPI: The patient is a 80 y.o. female who presents today to follow on a mole located on her back. She denies any changes, but says there is a lot of itching. She has a very large SK that I froze 08/26/2020. It is itching and she is back for round 2.   Review of Systems  Skin: Negative for color change and rash.       Lesion on back     Allergies Patient is allergic to codeine, darvocet [propoxyphene n-acetaminophen], vicodin [hydrocodone-acetaminophen], nickel, and sulfa antibiotics.  Past Medical History Patient  has a past medical history of Arthritis, DVT (deep venous thrombosis) (HCC), Family history of blood clots, blood clots, Hypertension, Hypothyroidism, Left bundle branch block, Macular degeneration, Peripheral vascular disease (HCC), Post-phlebitic syndrome (04/18/2013), Rotator cuff rupture, complete (07/05/2011), and Varicose veins.  Surgical History Patient  has a past surgical history that includes Appendectomy (1979); Back surgery (2009); Shoulder open rotator cuff repair (07/05/2011); Endovenous ablation saphenous vein w/ laser (Right, 09/15/2013); Endovenous ablation saphenous vein w/ laser (Left, 10/16/2013); left open rotator cuff surgery  (2010 ); Total knee arthroplasty (Right, 09/09/2014); lumbar infusion (01/16/2008); and Partial hysterectomy (1979).  Family History Pateint's family history includes AAA (abdominal aortic aneurysm) in her brother and father; Cancer in her mother and sister; Deep vein thrombosis in her mother; Depression in her mother; Diabetes in her brother, mother, sister, sister, and sister; Heart disease in her father, mother, and sister; Heart disease (age of onset: 23) in her sister; Hypertension in her mother, sister, and sister; Obesity in her mother; Peripheral  vascular disease in her brother; Varicose Veins in her mother and sister; Vision loss in her brother, sister, sister, and sister.  Social History Patient  reports that she quit smoking about 56 years ago. Her smoking use included cigarettes. She has a 3.25 pack-year smoking history. She has never used smokeless tobacco. She reports that she does not drink alcohol and does not use drugs.    Objective: Vitals:   09/16/20 1353  BP: 124/70  Pulse: 78  Temp: 97.8 F (36.6 C)  TempSrc: Temporal  SpO2: 97%  Weight: 175 lb 6.4 oz (79.6 kg)  Height: 5\' 2"  (1.575 m)    Body mass index is 32.08 kg/m.  Physical Exam Vitals reviewed.  Constitutional:      Appearance: Normal appearance. She is obese.  Pulmonary:     Effort: Pulmonary effort is normal.  Skin:    Comments: Dark brown macular lesion stuck on mid back, looks like thumb print. About 1cm in diameter or less. No erythema, edema or abnormal shape. Edge have started to peel up from last cryo.   Neurological:     Mental Status: She is alert.         Procedure note Verbal consent obtained for cryotherapy to irritated SK on her back. Risks discussed including need for multiple freezes, blistering, pain. 2 light cycles of freeze were done for 10 seconds. Tolerated well with no issues. Wound care given.   Assessment/plan: 1. Seborrheic keratosis, inflamed x2 round of cryotherapy. Already starting to come off around edges. Hopefully this will do it, if not will shave at next visit. Tolerated well. Already has appt scheduled in may.      Return if symptoms  worsen or fail to improve.   Orland Mustard, MD [facility]   09/16/2020

## 2020-09-16 NOTE — Telephone Encounter (Signed)
Patient was seen today with Dr.Wolfe, and was recommenced by [facility] to see about doing a TOC TO Dr.Andy. Eric really wants to stay with the practice once Dr.Wolfe's gone, is it okay for TOC?

## 2020-09-20 NOTE — Telephone Encounter (Signed)
Patient is scheduled   

## 2020-09-20 NOTE — Telephone Encounter (Signed)
Yes, that is ok if referred by Dr. Artis Flock. Thank you.

## 2020-09-27 ENCOUNTER — Other Ambulatory Visit: Payer: Self-pay | Admitting: Family Medicine

## 2020-10-01 ENCOUNTER — Other Ambulatory Visit: Payer: Self-pay | Admitting: Family Medicine

## 2020-10-14 ENCOUNTER — Encounter: Payer: Self-pay | Admitting: Family Medicine

## 2020-10-14 ENCOUNTER — Ambulatory Visit (INDEPENDENT_AMBULATORY_CARE_PROVIDER_SITE_OTHER): Payer: Medicare Other | Admitting: Family Medicine

## 2020-10-14 ENCOUNTER — Other Ambulatory Visit: Payer: Self-pay

## 2020-10-14 ENCOUNTER — Telehealth: Payer: Self-pay

## 2020-10-14 ENCOUNTER — Ambulatory Visit (HOSPITAL_COMMUNITY)
Admission: RE | Admit: 2020-10-14 | Discharge: 2020-10-14 | Disposition: A | Discharge status: Home / Self Care | Payer: Medicare Other | Source: Ambulatory Visit | Attending: Family Medicine | Admitting: Family Medicine

## 2020-10-14 VITALS — BP 128/74 | HR 76 | Temp 97.7°F | Ht 62.0 in | Wt 180.2 lb

## 2020-10-14 DIAGNOSIS — L820 Inflamed seborrheic keratosis: Secondary | ICD-10-CM

## 2020-10-14 DIAGNOSIS — M79661 Pain in right lower leg: Secondary | ICD-10-CM | POA: Diagnosis not present

## 2020-10-14 DIAGNOSIS — E038 Other specified hypothyroidism: Secondary | ICD-10-CM | POA: Diagnosis not present

## 2020-10-14 DIAGNOSIS — I10 Essential (primary) hypertension: Secondary | ICD-10-CM

## 2020-10-14 DIAGNOSIS — R7303 Prediabetes: Secondary | ICD-10-CM

## 2020-10-14 DIAGNOSIS — M7989 Other specified soft tissue disorders: Secondary | ICD-10-CM

## 2020-10-14 DIAGNOSIS — E7800 Pure hypercholesterolemia, unspecified: Secondary | ICD-10-CM

## 2020-10-14 LAB — CBC WITH DIFFERENTIAL/PLATELET
Basophils Absolute: 0 10*3/uL (ref 0.0–0.1)
Basophils Relative: 0.3 % (ref 0.0–3.0)
Eosinophils Absolute: 0 10*3/uL (ref 0.0–0.7)
Eosinophils Relative: 0.7 % (ref 0.0–5.0)
HCT: 36.9 % (ref 36.0–46.0)
Hemoglobin: 13 g/dL (ref 12.0–15.0)
Lymphocytes Relative: 26 % (ref 12.0–46.0)
Lymphs Abs: 2 10*3/uL (ref 0.7–4.0)
MCHC: 35.2 g/dL (ref 30.0–36.0)
MCV: 91.6 fl (ref 78.0–100.0)
Monocytes Absolute: 0.6 10*3/uL (ref 0.1–1.0)
Monocytes Relative: 8.5 % (ref 3.0–12.0)
Neutro Abs: 4.9 10*3/uL (ref 1.4–7.7)
Neutrophils Relative %: 64.5 % (ref 43.0–77.0)
Platelets: 195 10*3/uL (ref 150.0–400.0)
RBC: 4.03 Mil/uL (ref 3.87–5.11)
RDW: 13.9 % (ref 11.5–15.5)
WBC: 7.6 10*3/uL (ref 4.0–10.5)

## 2020-10-14 LAB — COMPREHENSIVE METABOLIC PANEL
ALT: 14 U/L (ref 0–35)
AST: 16 U/L (ref 0–37)
Albumin: 4.2 g/dL (ref 3.5–5.2)
Alkaline Phosphatase: 68 U/L (ref 39–117)
BUN: 20 mg/dL (ref 6–23)
CO2: 32 mEq/L (ref 19–32)
Calcium: 9.1 mg/dL (ref 8.4–10.5)
Chloride: 94 mEq/L — ABNORMAL LOW (ref 96–112)
Creatinine, Ser: 0.55 mg/dL (ref 0.40–1.20)
GFR: 85.47 mL/min (ref >60.00)
Glucose, Bld: 99 mg/dL (ref 70–99)
Potassium: 3.4 mEq/L — ABNORMAL LOW (ref 3.5–5.1)
Sodium: 132 mEq/L — ABNORMAL LOW (ref 135–145)
Total Bilirubin: 0.9 mg/dL (ref 0.2–1.2)
Total Protein: 6.4 g/dL (ref 6.0–8.3)

## 2020-10-14 LAB — TSH: TSH: 0.87 u[IU]/mL (ref 0.35–4.50)

## 2020-10-14 LAB — T4, FREE: Free T4: 0.9 ng/dL (ref 0.60–1.60)

## 2020-10-14 MED ORDER — LEVOTHYROXINE SODIUM 50 MCG PO TABS: 50.0000 ug | ORAL_TABLET | Freq: Every day | ORAL | 3 refills | Status: DC

## 2020-10-14 NOTE — Progress Notes (Signed)
Patient: Amber [name] MRN: [id number] DOB: 04/09/1941 PCP: Orland Mustard, MD     Subjective:  Chief Complaint  Patient presents with  . Hypertension  . Hypothyroidism  . Pre DM  . Hyperlipidemia  . Seborrheic keratosis, inflamed    check  . right lower leg swelling/pain    HPI: The patient is a 80 y.o. female who presents today for HTN, Cholesterol, Hypothyroidism, Pre DM, SK. She complains of a varicose vein located on the lower right leg that has become painful. She noticed almost 3 weeks ago.    Hypertension: Here for follow up of hypertension.  Currently on hctz 12.5mg /day and hyzaar 50-12.5mg /day. Takes medication as prescribed and denies any side effects. Exercise includes none. Weight has been stable. Denies any chest pain, headaches, shortness of breath, vision changes, swelling in lower extremities.   Prediabetes New diagnosis by [facility]. A1c checked and it was 5.8 in 10/21. not very impressive since almost 80 years of age. Discussed would not start medication in her at this point and would continue to work on diet/exercise.   Hyperlipidemia Labs just checked at [facility]. Very well controlled and to goal. She is on lipitor 43m/day. LDL was less than 70 on recent lab draw in 10/21.   hypothyoidism Due for labs today. No complaints, appears euthyroid. Specifically denies any trouble swallowing, voice changes, temperature intolerance.   Left lower leg swelling/pain She states it started about 3 weeks ago. It is painful and swollen and she isn't sure if its' a varicose vein. She is worried about a blood clot. She has pain in her calf and it hurts if you touch it. She has not traveled, been on airplane and denies any trauma to the area. No hx of smoking. No hypercoagulable state. No recent covid vaccine.   Review of Systems  Constitutional: Negative for chills, fatigue and fever.  HENT: Negative for dental problem, ear pain, hearing loss and trouble swallowing.   Eyes: Negative  for visual disturbance.  Respiratory: Negative for cough, chest tightness and shortness of breath.   Cardiovascular: Positive for leg swelling. Negative for chest pain and palpitations.  Gastrointestinal: Negative for abdominal pain, blood in stool, diarrhea and nausea.  Endocrine: Negative for cold intolerance, polydipsia, polyphagia and polyuria.  Genitourinary: Negative for dysuria and hematuria.  Musculoskeletal: Negative for arthralgias.       Left calf pain  Skin: Negative for rash.  Neurological: Negative for dizziness and headaches.  Psychiatric/Behavioral: Negative for dysphoric mood and sleep disturbance. The patient is not nervous/anxious.     Allergies Patient is allergic to codeine, darvocet [propoxyphene n-acetaminophen], vicodin [hydrocodone-acetaminophen], nickel, and sulfa antibiotics.  Past Medical History Patient  has a past medical history of Arthritis, DVT (deep venous thrombosis) (HCC), Family history of blood clots, blood clots, Hypertension, Hypothyroidism, Left bundle branch block, Macular degeneration, Peripheral vascular disease (HCC), Post-phlebitic syndrome (04/18/2013), Rotator cuff rupture, complete (07/05/2011), and Varicose veins.  Surgical History Patient  has a past surgical history that includes Appendectomy (1979); Back surgery (2009); Shoulder open rotator cuff repair (07/05/2011); Endovenous ablation saphenous vein w/ laser (Right, 09/15/2013); Endovenous ablation saphenous vein w/ laser (Left, 10/16/2013); left open rotator cuff surgery  (2010 ); Total knee arthroplasty (Right, 09/09/2014); lumbar infusion (01/16/2008); and Partial hysterectomy (1979).  Family History Pateint's family history includes AAA (abdominal aortic aneurysm) in her brother and father; Cancer in her mother and sister; Deep vein thrombosis in her mother; Depression in her mother; Diabetes in her brother, mother, sister, sister, and  sister; Heart disease in her father, mother, and  sister; Heart disease (age of onset: 75) in her sister; Hypertension in her mother, sister, and sister; Obesity in her mother; Peripheral vascular disease in her brother; Varicose Veins in her mother and sister; Vision loss in her brother, sister, sister, and sister.  Social History Patient  reports that she quit smoking about 56 years ago. Her smoking use included cigarettes. She has a 3.25 pack-year smoking history. She has never used smokeless tobacco. She reports that she does not drink alcohol and does not use drugs.    Objective: Vitals:   10/14/20 1109  BP: 128/74  Pulse: 76  Temp: 97.7 F (36.5 C)  TempSrc: Temporal  SpO2: 98%  Weight: 180 lb 3.2 oz (81.7 kg)  Height: 5\' 2"  (1.575 m)    Body mass index is 32.96 kg/m.  Physical Exam Vitals reviewed.  Constitutional:      Appearance: Normal appearance. She is well-developed. She is obese.  HENT:     Head: Normocephalic and atraumatic.     Right Ear: External ear normal.     Left Ear: External ear normal.  Eyes:     Conjunctiva/sclera: Conjunctivae normal.     Pupils: Pupils are equal, round, and reactive to light.  Neck:     Thyroid: No thyromegaly.     Vascular: No carotid bruit.  Cardiovascular:     Rate and Rhythm: Normal rate and regular rhythm.     Heart sounds: Normal heart sounds. No murmur heard.   Pulmonary:     Effort: Pulmonary effort is normal.     Breath sounds: Normal breath sounds.  Abdominal:     General: Bowel sounds are normal. There is no distension.     Palpations: Abdomen is soft.     Tenderness: There is no abdominal tenderness.  Musculoskeletal:     Cervical back: Normal range of motion and neck supple.     Right lower leg: Edema (+homan sign. no larger circumference. large varicose vein wiht ttp. ) present.  Lymphadenopathy:     Cervical: No cervical adenopathy.  Skin:    General: Skin is warm and dry.     Capillary Refill: Capillary refill takes less than 2 seconds.     Findings:  No rash.     Comments: Irritated SK on left  Upper back. Has gotten better. Still has some irritation.   Neurological:     General: No focal deficit present.     Mental Status: She is alert and oriented to person, place, and time.     Cranial Nerves: No cranial nerve deficit.     Coordination: Coordination normal.     Deep Tendon Reflexes: Reflexes normal.  Psychiatric:        Mood and Affect: Mood normal.        Behavior: Behavior normal.    Procedure note Verbal consent obtained for cryotherapy to irritated SK on her back. Risks discussed including need for multiple freezes, blistering, pain, scar. 2 light cycles of freeze were done for 10 seconds. Tolerated well with no issues. Wound care given.    stat ultrasound: no superficial or deep DVT  Assessment/plan: 1. Primary hypertension Blood pressure is to goal. Continue current anti-hypertensive medication. Currently on hctz 12.5mg /day and hyzaar 50-12.5mg /day.-. Refills not given and routine lab work will be done today. Recommended routine exercise and healthy diet including DASH diet and mediterranean diet. Encouraged weight loss. F/u in 6 months.    - Comprehensive metabolic  panel - CBC with Differential/Platelet  2. Other specified hypothyroidism Appear euthyroid. Labs and refills.  - TSH - T4, free  3. Pain and swelling of right lower leg Stat ultrasound negative for superficial or deep vein thrombosis. Varicose vein. Recommended voltaren gel, cool compresses.  - VAS [place] LOWER EXTREMITY VENOUS (DVT); Future  4. Hypercholesterolemia Lipids to goal in 10/21. Recheck at f/u.   5. Prediabetes a1c of 5.8. continue to check yearly but at her age I really do not expect this to change much. Continue lifestyle modifications.   6. Inflamed SK -cryotherapy today. Is nearly gone. F/u as needed.     Return in about 6 months (around 04/16/2021) for routine 6 months f/u: htn/lipid/prediabetes: dr. Sherlynn Stalls .   Orland Mustard,  MD [facility]   10/14/2020

## 2020-10-14 NOTE — Patient Instructions (Signed)
-  ultrasound for leg today  -routine labs, will need fasting labs at next visit.  -hopefully last freeze for SK on your back!!   -refilled thyroid to mail order.    im going to miss you so much!  Thanks for letting me take care of you! Aw

## 2020-10-14 NOTE — Telephone Encounter (Signed)
Matthew calling from Cardiology/Vascular Medicine to report:  Venous Duplex: Negative for DVT (No blood clots)  Pt is aware and was told to use ice and Voltaren gel. She voiced understanding.

## 2020-10-19 ENCOUNTER — Ambulatory Visit: Payer: Medicare Other | Admitting: Family Medicine

## 2020-10-20 ENCOUNTER — Ambulatory Visit: Payer: Medicare Other | Admitting: Family Medicine

## 2020-10-26 ENCOUNTER — Telehealth: Payer: Self-pay

## 2020-10-26 NOTE — Telephone Encounter (Signed)
Can use a same day within the next week.

## 2020-10-26 NOTE — Telephone Encounter (Signed)
Pt was just released from the hospital. She needs to schedule a hospital follow up appt with Dr. Mardelle Matte. When is a good time for me to schedule her? Please advise.

## 2020-11-01 ENCOUNTER — Other Ambulatory Visit: Payer: Self-pay

## 2020-11-01 ENCOUNTER — Telehealth: Payer: Self-pay

## 2020-11-01 ENCOUNTER — Ambulatory Visit (INDEPENDENT_AMBULATORY_CARE_PROVIDER_SITE_OTHER): Payer: Medicare Other | Admitting: Family Medicine

## 2020-11-01 ENCOUNTER — Encounter: Payer: Self-pay | Admitting: Family Medicine

## 2020-11-01 VITALS — BP 122/80 | HR 76 | Temp 97.9°F | Resp 15 | Ht 62.0 in | Wt 181.0 lb

## 2020-11-01 DIAGNOSIS — M5417 Radiculopathy, lumbosacral region: Secondary | ICD-10-CM | POA: Diagnosis not present

## 2020-11-01 DIAGNOSIS — I10 Essential (primary) hypertension: Secondary | ICD-10-CM

## 2020-11-01 DIAGNOSIS — E871 Hypo-osmolality and hyponatremia: Secondary | ICD-10-CM | POA: Diagnosis not present

## 2020-11-01 DIAGNOSIS — N3000 Acute cystitis without hematuria: Secondary | ICD-10-CM

## 2020-11-01 MED ORDER — CYCLOBENZAPRINE HCL 10 MG PO TABS: 10.0000 mg | ORAL_TABLET | Freq: Three times a day (TID) | ORAL | 0 refills | Status: DC | PRN

## 2020-11-01 MED ORDER — OXYCODONE HCL 5 MG PO CAPS: 5.0000 mg | ORAL_CAPSULE | Freq: Four times a day (QID) | ORAL | 0 refills | Status: DC | PRN

## 2020-11-01 NOTE — Telephone Encounter (Signed)
Returned call to give verbal orders.

## 2020-11-01 NOTE — Patient Instructions (Signed)
Please return as scheduled.  I will release your lab results to you on your MyChart account with further instructions. Please reply with any questions.   It was a pleasure meeting you today! Thank you for choosing [place] to meet your healthcare needs! I truly look forward to working with you. If you have any questions or concerns, please send me a message via Mychart or call the office at [phone].

## 2020-11-01 NOTE — Progress Notes (Signed)
Subjective  CC:  Chief Complaint  Patient presents with  . Hospitalization Follow-up    Acute Hyponatremia, UTI, pinched nerve in back    This is an 80 year old former patient of Dr. Sheppard Penton who presents for transfer of care and a hospital follow-up.  She was evaluated at [facility] due to [facility] health from May 25 to May 30.  I reviewed all hospital summaries, notes, imaging studies and lab work.  Brief summary as documented below. HPI: Amber [name] is a 80 y.o. female who presents to the office today to address the problems listed above in the chief complaint.  80 year old who presented to emergency after several day history of severe left groin pain.  She was evaluated by her orthopedic surgeon.  Typical pain medicines and steroids did not improve her pain.  She had such severe pain that she had recurrent nausea and vomiting.  When she was admitted to the hospital she was hyponatremic and requiring IV narcotic pain medicines to make her comfortable.  Her hyponatremia was treated with IV fluids, etiology thought to be dehydration and vomiting and hydrochlorothiazide for blood pressure control.  She was also hypokalemic related to medication use.  Also to vomiting.  Fortunately, once her nausea, vomiting and pain is controlled, she did well.  Sodium was 119 upon presentation and 130 at discharge.  Blood pressure medications were adjusted, increasing losartan to 100 mg daily and decreasing HCTZ to 12.5 mg daily.  Home blood pressure symptoms well on this medication.  She is using oxycodone 10 mg every 6 hours to control her pain but has follow-up with orthopedics in 3 days.  She will need more pain medications to get her through.  She denies constipation.  She is on a bowel regimen.  She was treated for an incidental UTI.  She has no further symptoms of dysuria.  Her appetite is returned to normal.  Her mental status is at baseline.  She presents with both her daughter and son.  All  questions were answered.   Assessment  1. Lumbosacral radiculopathy at S1   2. Hyponatremia   3. Primary hypertension   4. Acute cystitis without hematuria      Plan   Lumbosacral radiculopathy: Improved with steroids and pain medications.  Discussed trying to decrease or wean from oxycodone 10.  Refill medications, oxycodone immediate release 5 mg to use as needed.  She may consider trying to use tramadol instead and/or Tylenol.  She will follow-up with orthopedics in 3 days.  Discussed bowel regimen to avoid narcotic induced constipation.  Hyponatremia: Multifactorial.  Recheck levels today.  I will have a low threshold to stop HCTZ if not yet normalized.  Blood pressures are stable.  Mental status is stable.  Hypertension which has been well controlled on Hyzaar and amlodipine.  We will adjust medications based on report today.  Acute cystitis resolved by symptoms.  Reassured.  Today's visit was 46 minutes long. Greater than 50% of this time was devoted to face to face counseling with the patient and coordination of care. We discussed her diagnosis, prognosis, treatment options and treatment plan is documented above.   Follow up: As scheduled 12/06/2020  Orders Placed This Encounter  Procedures  . Basic metabolic panel  . Hepatic function panel   Meds ordered this encounter  Medications  . oxycodone (OXY-IR) 5 MG capsule    Sig: Take 1-2 capsules (5-10 mg total) by mouth every 6 (six) hours as needed.  Dispense:  30 capsule    Refill:  0  . cyclobenzaprine (FLEXERIL) 10 MG tablet    Sig: Take 1 tablet (10 mg total) by mouth 3 (three) times daily as needed for muscle spasms.    Dispense:  30 tablet    Refill:  0      I reviewed the patients updated PMH, FH, and SocHx.    Patient Active Problem List   Diagnosis Date Noted  . Class 1 obesity due to excess calories in adult 03/29/2020  . Prediabetes 03/18/2020  . Laryngopharyngeal reflux (LPR) 03/11/2019  .  Gastroesophageal reflux disease 01/19/2016  . Hoarseness, chronic 12/28/2015  . Complete left bundle branch block 12/08/2015  . Hypercholesterolemia 12/08/2015  . Hypertension 12/08/2015  . Hypothyroidism 12/08/2015  . Peripheral vascular disease, unspecified (HCC) 04/18/2013  . Chronic venous insufficiency 04/18/2013   Current Meds  Medication Sig  . acetaminophen (TYLENOL) 500 MG tablet Take 500-1,000 mg by mouth See admin instructions. Take 500mg  in the AM and 1000mg  in the PM.  . aspirin 81 MG chewable tablet Chew 81 mg by mouth at bedtime.   atorvastatin (LIPITOR) 20 MG tablet TAKE 1 TABLET AT BEDTIME  . B Complex Vitamins (VITAMIN-B COMPLEX) TABS Take 1 tablet by mouth daily.   . Cholecalciferol (VITAMIN D3) 1000 units CAPS Take 1,000 Units by mouth daily.   . clobetasol ointment (TEMOVATE) 0.05 % clobetasol 0.05 % topical ointment  . dexamethasone (DECADRON) 4 MG tablet Take 4 mg by mouth daily.  gabapentin (NEURONTIN) 100 MG capsule Take 100 mg by mouth 3 (three) times daily.  . hydrochlorothiazide (MICROZIDE) 12.5 MG capsule TAKE 1 CAPSULE DAILY  . levothyroxine (SYNTHROID) 50 MCG tablet Take 1 tablet (50 mcg total) by mouth daily before breakfast.  . meclizine (ANTIVERT) 25 MG tablet Take 1 tablet (25 mg total) by mouth 2 (two) times daily.  . Multiple Vitamins-Minerals (PRESERVISION AREDS PO) Take 1 tablet by mouth 2 (two) times daily.   Marland Kitchen nystatin (MYCOSTATIN/NYSTOP) powder Nyamyc 100,000 unit/gram topical powder  . oxycodone (OXY-IR) 5 MG capsule Take 1-2 capsules (5-10 mg total) by mouth every 6 (six) hours as needed.  . pantoprazole (PROTONIX) 40 MG tablet Take 40 mg by mouth daily.  . polyvinyl alcohol (LIQUIFILM TEARS) 1.4 % ophthalmic solution Place 1 drop into both eyes daily as needed for dry eyes.  . vitamin C (ASCORBIC ACID) 500 MG tablet Take 500 mg by mouth daily.  . [DISCONTINUED] cyclobenzaprine (FLEXERIL) 10 MG tablet Take 10 mg by mouth 3 (three) times  daily as needed for muscle spasms.  . [DISCONTINUED] losartan-hydrochlorothiazide (HYZAAR) 50-12.5 MG tablet TAKE 1 TABLET DAILY  . [DISCONTINUED] Oxycodone HCl 10 MG TABS Take by mouth.    Allergies: Patient is allergic to codeine, darvocet [propoxyphene n-acetaminophen], vicodin [hydrocodone-acetaminophen], nickel, and sulfa antibiotics. Family History: Patient family history includes AAA (abdominal aortic aneurysm) in her brother and father; Cancer in her mother and sister; Deep vein thrombosis in her mother; Depression in her mother; Diabetes in her brother, mother, sister, sister, and sister; Heart disease in her father, mother, and sister; Heart disease (age of onset: 67) in her sister; Hypertension in her mother, sister, and sister; Obesity in her mother; Peripheral vascular disease in her brother; Varicose Veins in her mother and sister; Vision loss in her brother, sister, sister, and sister. Social History:  Patient  reports that she quit smoking about 56 years ago. Her smoking use included cigarettes. She has a 3.25 pack-year smoking  history. She has never used smokeless tobacco. She reports that she does not drink alcohol and does not use drugs.  Review of Systems: Constitutional: Negative for fever malaise or anorexia Cardiovascular: negative for chest pain Respiratory: negative for SOB or persistent cough Gastrointestinal: negative for abdominal pain  Objective  Vitals: BP 122/80   Pulse 76   Temp 97.9 F (36.6 C) (Temporal)   Resp 15   Ht 5\' 2"  (1.575 m)   Wt 181 lb (82.1 kg)   LMP  (LMP Unknown)   SpO2 96%   BMI 33.11 kg/m  General: no acute distress , A&Ox3 Mental status: Alert and cooperative.  Normal speech. HEENT: PEERL, conjunctiva normal, neck is supple Cardiovascular:  RRR without murmur or gallop.  Respiratory:  Good breath sounds bilaterally, CTAB with normal respiratory effort Skin:  Warm, no rashes Trace pedal edema     Commons side effects, risks,  benefits, and alternatives for medications and treatment plan prescribed today were discussed, and the patient expressed understanding of the given instructions. Patient is instructed to call or message via MyChart if he/she has any questions or concerns regarding our treatment plan. No barriers to understanding were identified. We discussed Red Flag symptoms and signs in detail. Patient expressed understanding regarding what to do in case of urgent or emergency type symptoms.   Medication list was reconciled, printed and provided to the patient in AVS. Patient instructions and summary information was reviewed with the patient as documented in the AVS. This note was prepared with assistance of Dragon voice recognition software. Occasional wrong-word or sound-a-like substitutions may have occurred due to the inherent limitations of voice recognition software  This visit occurred during the SARS-CoV-2 public health emergency.  Safety protocols were in place, including screening questions prior to the visit, additional usage of staff PPE, and extensive cleaning of exam room while observing appropriate contact time as indicated for disinfecting solutions.

## 2020-11-01 NOTE — Telephone Encounter (Signed)
Home Health Verbal Orders  Agency:  [facility]   Requesting PT  Reason for Request: Strengthen gait , and fall prevention   Frequency:  1 week 1 // 2 week 5 // 2 wwwk 2

## 2020-11-02 ENCOUNTER — Telehealth: Payer: Self-pay

## 2020-11-02 ENCOUNTER — Other Ambulatory Visit: Payer: Self-pay

## 2020-11-02 LAB — BASIC METABOLIC PANEL
BUN: 26 mg/dL — ABNORMAL HIGH (ref 6–23)
CO2: 31 mEq/L (ref 19–32)
Calcium: 8.6 mg/dL (ref 8.4–10.5)
Chloride: 94 mEq/L — ABNORMAL LOW (ref 96–112)
Creatinine, Ser: 0.7 mg/dL (ref 0.40–1.20)
GFR: 80.62 mL/min (ref >60.00)
Glucose, Bld: 97 mg/dL (ref 70–99)
Potassium: 4.2 mEq/L (ref 3.5–5.1)
Sodium: 133 mEq/L — ABNORMAL LOW (ref 135–145)

## 2020-11-02 LAB — HEPATIC FUNCTION PANEL
ALT: 15 U/L (ref 0–35)
AST: 13 U/L (ref 0–37)
Albumin: 3.6 g/dL (ref 3.5–5.2)
Alkaline Phosphatase: 42 U/L (ref 39–117)
Bilirubin, Direct: 0.2 mg/dL (ref 0.0–0.3)
Total Bilirubin: 1.1 mg/dL (ref 0.2–1.2)
Total Protein: 5.7 g/dL — ABNORMAL LOW (ref 6.0–8.3)

## 2020-11-02 MED ORDER — OXYCODONE HCL 5 MG PO TABS: 5.0000 mg | ORAL_TABLET | Freq: Four times a day (QID) | ORAL | 0 refills | Status: DC | PRN

## 2020-11-02 NOTE — Telephone Encounter (Signed)
Spoke with patients daughter regarding medication. Called and cancelled capsules and will send in tablets for patient.

## 2020-11-02 NOTE — Telephone Encounter (Signed)
Caller states she had an appt earlier today and they had cyclobenzaprine called in, but the oxycodone was called in as caplets instead of tablets, but the [facility] has no caplets. Can it be called in tomorrow as tablets?

## 2020-11-03 ENCOUNTER — Telehealth: Payer: Self-pay

## 2020-11-03 NOTE — Telephone Encounter (Signed)
Patients daughter is calling back regarding lab results

## 2020-11-03 NOTE — Progress Notes (Signed)
Please call patient: I have reviewed his/her lab results. Please let her know that her sodium is stable. She may continue the blood pressure medications as is. I will recheck again next month.

## 2020-11-04 ENCOUNTER — Telehealth: Payer: Self-pay

## 2020-11-04 NOTE — Telephone Encounter (Signed)
Left voicemail for patient to return call.

## 2020-11-04 NOTE — Telephone Encounter (Signed)
Spoke with patient and her daughter regarding labs

## 2020-11-04 NOTE — Telephone Encounter (Signed)
Patient is calling in wanting to know if they can get a call back about the lab results.

## 2020-11-04 NOTE — Telephone Encounter (Signed)
  LAST APPOINTMENT DATE: 11/01/2020   NEXT APPOINTMENT DATE:@7 /03/2021  MEDICATION:gabapentin (NEURONTIN) 100 MG capsule  PHARMACY:[id number] - , [place] - [address] AT [facility] [address]   Please advise

## 2020-11-05 ENCOUNTER — Other Ambulatory Visit: Payer: Self-pay

## 2020-11-05 MED ORDER — GABAPENTIN 100 MG PO CAPS: 100.0000 mg | ORAL_CAPSULE | Freq: Three times a day (TID) | ORAL | 1 refills | Status: DC

## 2020-11-05 NOTE — Telephone Encounter (Signed)
Medication filled.  

## 2020-11-09 ENCOUNTER — Telehealth: Payer: Self-pay

## 2020-11-09 NOTE — Telephone Encounter (Signed)
MEDICATION: oxyCODONE (OXY IR/ROXICODONE) 5 MG immediate release tablet   PHARMACY:  [facility] #[id number] - [place], [place] - [address] AT [facility] [address] [facility] [address] Phone:  [phone]  Fax:  [phone]      Comments:   **Let patient know to contact pharmacy at the end of the day to make sure medication is ready. **  ** Please notify patient to allow 48-72 hours to process**  **Encourage patient to contact the pharmacy for refills or they can request refills through [facility]**

## 2020-11-11 ENCOUNTER — Telehealth: Payer: Self-pay

## 2020-11-11 MED ORDER — CYCLOBENZAPRINE HCL 10 MG PO TABS: 10.0000 mg | ORAL_TABLET | Freq: Three times a day (TID) | ORAL | 0 refills | Status: AC | PRN

## 2020-11-11 NOTE — Telephone Encounter (Signed)
  LAST APPOINTMENT DATE: 11/09/2020   NEXT APPOINTMENT DATE:@7 /03/2021  MEDICATION: cyclobenzaprine (FLEXERIL) 10 MG tablet  PHARMACY:[facility] #[id number] - , [place] - [address] AT [facility] [address] [facility] [address]

## 2020-11-11 NOTE — Telephone Encounter (Signed)
Spoke to pt's daughter in-law told her Rx was sent to pharmacy as requested. She said she will let her know.

## 2020-11-12 ENCOUNTER — Telehealth: Payer: Self-pay

## 2020-11-12 NOTE — Telephone Encounter (Signed)
Daughter returned call and states the injection is for an Epidural, for back pain. Has an appointment on Tuesday 6/21.

## 2020-11-12 NOTE — Telephone Encounter (Signed)
Left voicemail for patient to return call to clarify injection patient is referring to

## 2020-11-12 NOTE — Telephone Encounter (Signed)
Patient's daughter is calling in stating that Amber [name] has stopped her blood thinner for 5 days so she is able to get the injection in her back.

## 2020-11-15 ENCOUNTER — Other Ambulatory Visit: Payer: Self-pay

## 2020-11-15 MED ORDER — OXYCODONE HCL 5 MG PO TABS: 5.0000 mg | ORAL_TABLET | Freq: Four times a day (QID) | ORAL | 0 refills | Status: DC | PRN

## 2020-11-15 NOTE — Telephone Encounter (Signed)
Patient is getting epidural tomorrow, daughter voiced concerns that in the past it has taken several days for procedure to help with the pain. PCP agreeable to partial fill of oxycodone to help until epidural begins to control pain

## 2020-11-15 NOTE — Telephone Encounter (Signed)
Daughter is wanting the prescriptions Tramadol 50 MG and Oxycodone to be sent in today. Tramadol was prescribed in the hospital and Amber [name] is about out.

## 2020-11-15 NOTE — Telephone Encounter (Signed)
Last Refill 11/02/2020 30ct Last OV 11/01/2020 dx lumbosacral radiculopathy

## 2020-11-15 NOTE — Telephone Encounter (Signed)
  LAST APPOINTMENT DATE: 11/01/2020   NEXT APPOINTMENT DATE:@7 /03/2021  MEDICATION: oxyCODONE (OXY IR/ROXICODONE) 5 MG immediate release tablet // Tramadol  PHARMACY: [id number] - [place], [place] - [address] AT [facility] OF [address]  Comments: Patient called after hours "Caller states she is wanting to know if the office will be open tomorrow . Caller states her mom has been on oxycodone for her back pain . Caller states she is needing a medication refill for oxycodone and Tramadol."   Please advise

## 2020-11-15 NOTE — Telephone Encounter (Signed)
I have already sent refill request to provider.Marland Kitchen

## 2020-11-18 ENCOUNTER — Telehealth: Payer: Self-pay

## 2020-11-18 ENCOUNTER — Other Ambulatory Visit: Payer: Self-pay

## 2020-11-18 DIAGNOSIS — I10 Essential (primary) hypertension: Secondary | ICD-10-CM

## 2020-11-18 MED ORDER — HYDROCHLOROTHIAZIDE 12.5 MG PO CAPS: 12.5000 mg | ORAL_CAPSULE | Freq: Every day | ORAL | 3 refills | Status: DC

## 2020-11-18 NOTE — Telephone Encounter (Signed)
  LAST APPOINTMENT DATE: 11/18/2020   NEXT APPOINTMENT DATE:@7 /03/2021  MEDICATION: oxyCODONE (OXY IR/ROXICODONE) 5 MG immediate release tablet  PHARMACY: [facility] #[id number] - [place], [place] - [address] AT [facility] [address]  COMMENTS: Daughter is wanting another 15 tablet refill to last her, as Amber [name] has enough to last her until today.

## 2020-11-18 NOTE — Telephone Encounter (Signed)
Please call daughter. Her mother is seeing Dr. Ethelene Hal who is managing her pain.  I filled the tramadol.  I filled oxy #15 6/20.   I do not believe she should still be needing oxy regularly and IF she is, then please request from dr. Ethelene Hal who has evaluated her most recently.

## 2020-11-18 NOTE — Telephone Encounter (Signed)
Spoke with patients daughter will discuss pain with Dr.Ramos

## 2020-11-18 NOTE — Telephone Encounter (Signed)
  LAST APPOINTMENT DATE: 11/01/2020   NEXT APPOINTMENT DATE:@7 /03/2021  MEDICATION: hydrochlorothiazide (MICROZIDE) 12.5 MG capsule // Losartan 100 MG  PHARMACY: [id number] - [place], [place] - [address] AT [facility] OF [address]  COMMENTS: Requesting 30 day refill

## 2020-11-18 NOTE — Telephone Encounter (Signed)
This medication was just filled 11/15/2020.

## 2020-11-19 ENCOUNTER — Other Ambulatory Visit: Payer: Self-pay

## 2020-11-19 DIAGNOSIS — I10 Essential (primary) hypertension: Secondary | ICD-10-CM

## 2020-11-19 MED ORDER — LOSARTAN POTASSIUM 100 MG PO TABS: 100.0000 mg | ORAL_TABLET | Freq: Every day | ORAL | 3 refills | Status: DC

## 2020-11-19 NOTE — Telephone Encounter (Signed)
Has been ordered

## 2020-11-19 NOTE — Telephone Encounter (Signed)
Amber [name] is calling in stating that the Losartan wasn't sent in and is wondering why.

## 2020-11-19 NOTE — Telephone Encounter (Signed)
Losartan is not on patients medication list anymore, please advise?  Lab note states to continue BP meds as instructed but Losartan was removed from list at last visit.

## 2020-12-06 ENCOUNTER — Encounter: Payer: Self-pay | Admitting: Family Medicine

## 2020-12-06 ENCOUNTER — Other Ambulatory Visit: Payer: Self-pay

## 2020-12-06 ENCOUNTER — Ambulatory Visit (INDEPENDENT_AMBULATORY_CARE_PROVIDER_SITE_OTHER): Payer: Medicare Other | Admitting: Family Medicine

## 2020-12-06 VITALS — BP 120/64 | HR 101 | Temp 97.2°F | Wt 182.4 lb

## 2020-12-06 DIAGNOSIS — E871 Hypo-osmolality and hyponatremia: Secondary | ICD-10-CM

## 2020-12-06 DIAGNOSIS — E782 Mixed hyperlipidemia: Secondary | ICD-10-CM

## 2020-12-06 DIAGNOSIS — Z780 Asymptomatic menopausal state: Secondary | ICD-10-CM | POA: Insufficient documentation

## 2020-12-06 DIAGNOSIS — R490 Dysphonia: Secondary | ICD-10-CM

## 2020-12-06 DIAGNOSIS — M5417 Radiculopathy, lumbosacral region: Secondary | ICD-10-CM | POA: Diagnosis not present

## 2020-12-06 DIAGNOSIS — K219 Gastro-esophageal reflux disease without esophagitis: Secondary | ICD-10-CM | POA: Diagnosis not present

## 2020-12-06 DIAGNOSIS — M8580 Other specified disorders of bone density and structure, unspecified site: Secondary | ICD-10-CM | POA: Insufficient documentation

## 2020-12-06 LAB — CBC WITH DIFFERENTIAL/PLATELET
Basophils Absolute: 0 10*3/uL (ref 0.0–0.1)
Basophils Relative: 0.4 % (ref 0.0–3.0)
Eosinophils Absolute: 0 10*3/uL (ref 0.0–0.7)
Eosinophils Relative: 0.4 % (ref 0.0–5.0)
HCT: 37.5 % (ref 36.0–46.0)
Hemoglobin: 13 g/dL (ref 12.0–15.0)
Lymphocytes Relative: 20.8 % (ref 12.0–46.0)
Lymphs Abs: 2 10*3/uL (ref 0.7–4.0)
MCHC: 34.8 g/dL (ref 30.0–36.0)
MCV: 92.9 fl (ref 78.0–100.0)
Monocytes Absolute: 0.8 10*3/uL (ref 0.1–1.0)
Monocytes Relative: 8 % (ref 3.0–12.0)
Neutro Abs: 6.7 10*3/uL (ref 1.4–7.7)
Neutrophils Relative %: 70.4 % (ref 43.0–77.0)
Platelets: 171 10*3/uL (ref 150.0–400.0)
RBC: 4.04 Mil/uL (ref 3.87–5.11)
RDW: 15.2 % (ref 11.5–15.5)
WBC: 9.5 10*3/uL (ref 4.0–10.5)

## 2020-12-06 LAB — COMPREHENSIVE METABOLIC PANEL
ALT: 19 U/L (ref 0–35)
AST: 19 U/L (ref 0–37)
Albumin: 4.3 g/dL (ref 3.5–5.2)
Alkaline Phosphatase: 74 U/L (ref 39–117)
BUN: 16 mg/dL (ref 6–23)
CO2: 29 mEq/L (ref 19–32)
Calcium: 9.8 mg/dL (ref 8.4–10.5)
Chloride: 96 mEq/L (ref 96–112)
Creatinine, Ser: 0.58 mg/dL (ref 0.40–1.20)
GFR: 84.3 mL/min (ref >60.00)
Glucose, Bld: 114 mg/dL — ABNORMAL HIGH (ref 70–99)
Potassium: 3.6 mEq/L (ref 3.5–5.1)
Sodium: 135 mEq/L (ref 135–145)
Total Bilirubin: 1.3 mg/dL — ABNORMAL HIGH (ref 0.2–1.2)
Total Protein: 6.6 g/dL (ref 6.0–8.3)

## 2020-12-06 MED ORDER — TRAMADOL HCL 50 MG PO TABS: 50.0000 mg | ORAL_TABLET | Freq: Three times a day (TID) | ORAL | 0 refills | Status: DC | PRN

## 2020-12-06 MED ORDER — OXYCODONE HCL 5 MG PO TABS: 5.0000 mg | ORAL_TABLET | Freq: Two times a day (BID) | ORAL | 0 refills | Status: DC | PRN

## 2020-12-06 MED ORDER — PANTOPRAZOLE SODIUM 40 MG PO TBEC: 40.0000 mg | DELAYED_RELEASE_TABLET | Freq: Every day | ORAL | 3 refills | Status: DC

## 2020-12-06 NOTE — Progress Notes (Signed)
Subjective  CC:  Chief Complaint  Patient presents with   Transitions Of Care   Lumbosacral radiculopathy    Epidural on 6/21, but did not relieve any pain. Continuees oxy and muscle relaxer. Has been several days since last BM, daughter states they are going to try Miralax this afternoon Nerve block scheduled for 7/19 with Dr. Ethelene Hal    Hyponatremia   Hypertension    HPI: Amber [name] is a 80 y.o. female who presents to the office today to address the problems listed above in the chief complaint. Hypertension f/u: Control is good . Pt reports she is doing well. taking medications as instructed, no medication side effects noted, no TIAs, no chest pain on exertion, no dyspnea on exertion, no swelling of ankles.  She is taking losartan 100 mg daily and 12.5 mg of hydrochlorothiazide daily.  Due for recheck of sodium.  She has chronic hyponatremia.   She denies adverse effects from his BP medications. Compliance with medication is good.  Lumbar radiculopathy being treated by Dr. Horald Chestnut.  Unfortunately continues to have significant amount of pain.  She is taking oxycodone 5 mg 3 times daily.  This does control her pain.  Bowels are moving but less well.  She is not on a bowel regimen routinely.  She has follow-up in 10 days for a nerve block.  He started gabapentin but she is only taking 100 mg nightly.  She thinks it is helpful.  She did try tramadol which helped decrease the use of oxycodone. Chronic hoarseness due to LPR and GERD: She stopped Protonix 2 weeks ago because she ran out.  She has noted some reflux and emesis.  No abdominal pain.  Assessment  1. Lumbosacral radiculopathy at S1   2. Hyponatremia   3. Mixed hyperlipidemia   4. Laryngopharyngeal reflux (LPR)   5. Hoarseness, chronic      Plan   Hypertension f/u: BP control is fairly well controlled.  Continue losartan 100 and HCTZ 12.5 mg daily.  Continue to monitor.  Recheck sodium levels and potassium Radiculopathy f/u:  Continues to be a significant problem for her.  We will try to wean down from oxycodone to twice daily and add tramadol.  Discussed how to titrate.  Recommend daily bowel regimen, start Colace twice daily and MiraLAX daily.  Resume Dulcolax to as needed use.  Has follow-up with Dr. Marvia Pickles.  Will inquire about chronic pain management with him. LPR and GERD: Restart Protonix 40 mg daily. Education regarding management of these chronic disease states was given. Management strategies discussed on successive visits include dietary and exercise recommendations, goals of achieving and maintaining IBW, and lifestyle modifications aiming for adequate sleep and minimizing stressors.   Follow up: 3 months for recheck  Orders Placed This Encounter  Procedures   Comprehensive metabolic panel   CBC with Differential/Platelet   Meds ordered this encounter  Medications   traMADol (ULTRAM) 50 MG tablet    Sig: Take 1 tablet (50 mg total) by mouth every 8 (eight) hours as needed.    Dispense:  30 tablet    Refill:  0   pantoprazole (PROTONIX) 40 MG tablet    Sig: Take 1 tablet (40 mg total) by mouth daily.    Dispense:  90 tablet    Refill:  3   oxyCODONE (OXY IR/ROXICODONE) 5 MG immediate release tablet    Sig: Take 1 tablet (5 mg total) by mouth 2 (two) times daily as needed for severe pain.  Dispense:  15 tablet    Refill:  0      BP Readings from Last 3 Encounters:  12/06/20 120/64  11/01/20 122/80  10/14/20 128/74   Wt Readings from Last 3 Encounters:  12/06/20 182 lb 6.4 oz (82.7 kg)  11/01/20 181 lb (82.1 kg)  10/14/20 180 lb 3.2 oz (81.7 kg)    Lab Results  Component Value Date   CHOL 133 03/17/2020   CHOL 109 02/17/2019   Lab Results  Component Value Date   HDL 53 03/17/2020   HDL 44.90 02/17/2019   Lab Results  Component Value Date   LDLCALC 59 03/17/2020   LDLCALC 32 02/17/2019   Lab Results  Component Value Date   TRIG 119 03/17/2020   TRIG 157.0 (H) 02/17/2019    Lab Results  Component Value Date   CHOLHDL 2 02/17/2019   No results found for: LDLDIRECT Lab Results  Component Value Date   CREATININE 0.70 11/01/2020   BUN 26 (H) 11/01/2020   NA 133 (L) 11/01/2020   K 4.2 11/01/2020   CL 94 (L) 11/01/2020   CO2 31 11/01/2020    The ASCVD Risk score (Goff DC Jr., et al., 2013) failed to calculate for the following reasons:   The 2013 ASCVD risk score is only valid for ages 58 to 67  I reviewed the patients updated PMH, FH, and SocHx.    Patient Active Problem List   Diagnosis Date Noted   Lumbosacral radiculopathy at S1 12/06/2020   Hyponatremia 12/06/2020   Mixed hyperlipidemia 12/06/2020   Osteopenia after menopause 12/06/2020   Class 1 obesity due to excess calories in adult 03/29/2020   Prediabetes 03/18/2020   Laryngopharyngeal reflux (LPR) 03/11/2019   Gastroesophageal reflux disease 01/19/2016   Hoarseness, chronic 12/28/2015   Complete left bundle branch block 12/08/2015   Essential hypertension 12/08/2015   Acquired hypothyroidism 12/08/2015   Peripheral vascular disease, unspecified (HCC) 04/18/2013   Chronic venous insufficiency 04/18/2013    Allergies: Codeine, Darvocet [propoxyphene n-acetaminophen], Vicodin [hydrocodone-acetaminophen], Nickel, and Sulfa antibiotics  Social History: Patient  reports that she quit smoking about 56 years ago. Her smoking use included cigarettes. She has a 3.25 pack-year smoking history. She has never used smokeless tobacco. She reports that she does not drink alcohol and does not use drugs.  Current Meds  Medication Sig   acetaminophen (TYLENOL) 500 MG tablet Take 500-1,000 mg by mouth See admin instructions. Take 500mg  in the AM and 1000mg  in the PM.   aspirin 81 MG chewable tablet Chew 81 mg by mouth at bedtime.    atorvastatin (LIPITOR) 20 MG tablet TAKE 1 TABLET AT BEDTIME   B Complex Vitamins (VITAMIN-B COMPLEX) TABS Take 1 tablet by mouth daily.    Cholecalciferol (VITAMIN D3)  1000 units CAPS Take 1,000 Units by mouth daily.    clobetasol ointment (TEMOVATE) 0.05 % clobetasol 0.05 % topical ointment   cyclobenzaprine (FLEXERIL) 10 MG tablet Take 1 tablet (10 mg total) by mouth 3 (three) times daily as needed for muscle spasms.   dexamethasone (DECADRON) 4 MG tablet Take 4 mg by mouth daily.   gabapentin (NEURONTIN) 100 MG capsule Take 1 capsule (100 mg total) by mouth 3 (three) times daily.   hydrochlorothiazide (MICROZIDE) 12.5 MG capsule Take 1 capsule (12.5 mg total) by mouth daily.   levothyroxine (SYNTHROID) 50 MCG tablet Take 1 tablet (50 mcg total) by mouth daily before breakfast.   losartan (COZAAR) 100 MG tablet Take 1 tablet (100 mg  total) by mouth daily.   meclizine (ANTIVERT) 25 MG tablet Take 1 tablet (25 mg total) by mouth 2 (two) times daily.   Multiple Vitamins-Minerals (PRESERVISION AREDS PO) Take 1 tablet by mouth 2 (two) times daily.    nystatin (MYCOSTATIN/NYSTOP) powder Nyamyc 100,000 unit/gram topical powder   polyvinyl alcohol (LIQUIFILM TEARS) 1.4 % ophthalmic solution Place 1 drop into both eyes daily as needed for dry eyes.   traMADol (ULTRAM) 50 MG tablet Take 1 tablet (50 mg total) by mouth every 8 (eight) hours as needed.   vitamin C (ASCORBIC ACID) 500 MG tablet Take 500 mg by mouth daily.   [DISCONTINUED] oxyCODONE (OXY IR/ROXICODONE) 5 MG immediate release tablet Take 1-2 tablets (5-10 mg total) by mouth every 6 (six) hours as needed for severe pain.    Review of Systems: Cardiovascular: negative for chest pain, palpitations, leg swelling, orthopnea Respiratory: negative for SOB, wheezing or persistent cough Gastrointestinal: negative for abdominal pain Genitourinary: negative for dysuria or gross hematuria  Objective  Vitals: BP 120/64   Pulse (!) 101   Temp (!) 97.2 F (36.2 C) (Temporal)   Wt 182 lb 6.4 oz (82.7 kg)   LMP  (LMP Unknown)   SpO2 95%   BMI 33.36 kg/m  General: no acute distress  Psych:  Alert and oriented,  normal mood and affect Cardiovascular:  RRR without murmur. no edema Respiratory:  Good breath sounds bilaterally, CTAB with normal respiratory effort  Commons side effects, risks, benefits, and alternatives for medications and treatment plan prescribed today were discussed, and the patient expressed understanding of the given instructions. Patient is instructed to call or message via MyChart if he/she has any questions or concerns regarding our treatment plan. No barriers to understanding were identified. We discussed Red Flag symptoms and signs in detail. Patient expressed understanding regarding what to do in case of urgent or emergency type symptoms.  Medication list was reconciled, printed and provided to the patient in AVS. Patient instructions and summary information was reviewed with the patient as documented in the AVS. This note was prepared with assistance of Dragon voice recognition software. Occasional wrong-word or sound-a-like substitutions may have occurred due to the inherent limitations of voice recognition software  This visit occurred during the SARS-CoV-2 public health emergency.  Safety protocols were in place, including screening questions prior to the visit, additional usage of staff PPE, and extensive cleaning of exam room while observing appropriate contact time as indicated for disinfecting solutions.

## 2020-12-06 NOTE — Patient Instructions (Signed)
Please return in 3 months for recheck.   I will release your lab results to you on your MyChart account with further instructions. Please reply with any questions.    Please decrease oxycodone use to twice daily and start tramadol in between. Then increase tramadol and decrease oxycodone down further if able.   Increase the gabapentin to 100mg  in the morning and 200mg  before bedtime. In a few days, if tolerated, then can increase to 300mg  at bedtime. Then can increase to 100mg  in the morning and 100mg  in the afternoon with the 300mg  at bedtime. Ultimately, can get to 300 three times daily if tolerated. This can help your nerve pain and help with sleep.   If you have any questions or concerns, please don't hesitate to send me a message via MyChart or call the office at [phone]. Thank you for visiting with today! It's our pleasure caring for you.

## 2020-12-10 ENCOUNTER — Other Ambulatory Visit: Payer: Self-pay | Admitting: Family Medicine

## 2020-12-10 ENCOUNTER — Ambulatory Visit: Payer: Medicare Other

## 2021-01-01 ENCOUNTER — Other Ambulatory Visit: Payer: Self-pay | Admitting: Family Medicine

## 2021-01-05 ENCOUNTER — Telehealth: Payer: Self-pay

## 2021-01-05 ENCOUNTER — Other Ambulatory Visit: Payer: Self-pay

## 2021-01-05 DIAGNOSIS — I10 Essential (primary) hypertension: Secondary | ICD-10-CM

## 2021-01-05 MED ORDER — LOSARTAN POTASSIUM 100 MG PO TABS: 100.0000 mg | ORAL_TABLET | Freq: Every day | ORAL | 0 refills | Status: DC

## 2021-01-05 NOTE — Telephone Encounter (Signed)
MEDICATION: losartan (COZAAR) 100 MG tablet  PHARMACY:  [facility] - [place], [place] - [address] Phone:  [phone]  Fax:  [phone]      Comments: Patient wants this medication sent to [facility] of [facility]. **Let patient know to contact pharmacy at the end of the day to make sure medication is ready. **  ** Please notify patient to allow 48-72 hours to process**  **Encourage patient to contact the pharmacy for refills or they can request refills through [facility]**

## 2021-01-05 NOTE — Telephone Encounter (Signed)
Left message on voicemail to call office.  

## 2021-01-05 NOTE — Telephone Encounter (Signed)
Refill sent to pharmacy.   

## 2021-01-07 NOTE — Telephone Encounter (Signed)
Spoke to pt's daughter Elita Quick, told her Rx for Losartan was sent to [facility]. Pam verbalized understanding.

## 2021-01-07 NOTE — Telephone Encounter (Signed)
Pt's daughter was returning a call. Please call her at [phone]

## 2021-02-04 ENCOUNTER — Telehealth: Payer: Self-pay

## 2021-02-04 NOTE — Telephone Encounter (Signed)
.   Encourage patient to contact the pharmacy for refills or they can request refills through [facility]  LAST APPOINTMENT DATE:  Please schedule appointment if longer than 1 year  NEXT APPOINTMENT DATE:  MEDICATION:traMADol (ULTRAM) 50 MG tablet  Is the patient out of medication?   PHARMACY:[id number] - [place], [place] - [address] AT [facility] [address] [facility] [address]  Let patient know to contact pharmacy at the end of the day to make sure medication is ready.  Please notify patient to allow 48-72 hours to process  CLINICAL FILLS OUT ALL BELOW:   LAST REFILL:  QTY:  REFILL DATE:    OTHER COMMENTS:    Okay for refill?  Please advise

## 2021-02-07 ENCOUNTER — Other Ambulatory Visit: Payer: Self-pay

## 2021-02-07 MED ORDER — TRAMADOL HCL 50 MG PO TABS: 50.0000 mg | ORAL_TABLET | Freq: Three times a day (TID) | ORAL | 0 refills | Status: DC | PRN

## 2021-02-07 NOTE — Telephone Encounter (Signed)
Last OV 12/06/2020 dx Radiculopathy  Last Refill 12/06/2020

## 2021-02-07 NOTE — Telephone Encounter (Signed)
Spoke with patients daughter, gave a verbal understanding. Will follow up with ortho regarding pain medication.

## 2021-02-07 NOTE — Telephone Encounter (Signed)
Please call patient. Looks like she is still seeing ortho who is prescribing pain meds (oxycodone). I recommend notifying ortho of her request for tramadol. I do not recommend long term use of both of these; ortho to manage pain now. Thanks

## 2021-02-07 NOTE — Progress Notes (Unsigned)
Last Refill 12/06/2020 Last OV 12/06/2020 dx Radiculopathy

## 2021-02-25 ENCOUNTER — Other Ambulatory Visit: Payer: Self-pay | Admitting: Family Medicine

## 2021-03-15 ENCOUNTER — Encounter: Payer: Self-pay | Admitting: Family Medicine

## 2021-03-15 ENCOUNTER — Other Ambulatory Visit: Payer: Self-pay

## 2021-03-15 ENCOUNTER — Ambulatory Visit (INDEPENDENT_AMBULATORY_CARE_PROVIDER_SITE_OTHER): Payer: Medicare Other | Admitting: Family Medicine

## 2021-03-15 VITALS — BP 128/68 | HR 76 | Temp 97.4°F | Ht 62.0 in | Wt 187.0 lb

## 2021-03-15 DIAGNOSIS — Z23 Encounter for immunization: Secondary | ICD-10-CM | POA: Diagnosis not present

## 2021-03-15 DIAGNOSIS — K219 Gastro-esophageal reflux disease without esophagitis: Secondary | ICD-10-CM

## 2021-03-15 DIAGNOSIS — M5417 Radiculopathy, lumbosacral region: Secondary | ICD-10-CM

## 2021-03-15 DIAGNOSIS — E782 Mixed hyperlipidemia: Secondary | ICD-10-CM

## 2021-03-15 DIAGNOSIS — R7303 Prediabetes: Secondary | ICD-10-CM

## 2021-03-15 DIAGNOSIS — E039 Hypothyroidism, unspecified: Secondary | ICD-10-CM | POA: Diagnosis not present

## 2021-03-15 DIAGNOSIS — I10 Essential (primary) hypertension: Secondary | ICD-10-CM

## 2021-03-15 LAB — CBC WITH DIFFERENTIAL/PLATELET
Basophils Absolute: 0 10*3/uL (ref 0.0–0.1)
Basophils Relative: 0.2 % (ref 0.0–3.0)
Eosinophils Absolute: 0 10*3/uL (ref 0.0–0.7)
Eosinophils Relative: 0.4 % (ref 0.0–5.0)
HCT: 41.5 % (ref 36.0–46.0)
Hemoglobin: 14 g/dL (ref 12.0–15.0)
Lymphocytes Relative: 23.7 % (ref 12.0–46.0)
Lymphs Abs: 2.6 10*3/uL (ref 0.7–4.0)
MCHC: 33.7 g/dL (ref 30.0–36.0)
MCV: 95.1 fl (ref 78.0–100.0)
Monocytes Absolute: 0.8 10*3/uL (ref 0.1–1.0)
Monocytes Relative: 7.7 % (ref 3.0–12.0)
Neutro Abs: 7.4 10*3/uL (ref 1.4–7.7)
Neutrophils Relative %: 68 % (ref 43.0–77.0)
Platelets: 195 10*3/uL (ref 150.0–400.0)
RBC: 4.36 Mil/uL (ref 3.87–5.11)
RDW: 13.9 % (ref 11.5–15.5)
WBC: 10.8 10*3/uL — ABNORMAL HIGH (ref 4.0–10.5)

## 2021-03-15 LAB — COMPREHENSIVE METABOLIC PANEL
ALT: 18 U/L (ref 0–35)
AST: 18 U/L (ref 0–37)
Albumin: 4.3 g/dL (ref 3.5–5.2)
Alkaline Phosphatase: 60 U/L (ref 39–117)
BUN: 20 mg/dL (ref 6–23)
CO2: 31 mEq/L (ref 19–32)
Calcium: 9.5 mg/dL (ref 8.4–10.5)
Chloride: 94 mEq/L — ABNORMAL LOW (ref 96–112)
Creatinine, Ser: 0.76 mg/dL (ref 0.40–1.20)
GFR: 72.86 mL/min (ref >60.00)
Glucose, Bld: 92 mg/dL (ref 70–99)
Potassium: 3.6 mEq/L (ref 3.5–5.1)
Sodium: 134 mEq/L — ABNORMAL LOW (ref 135–145)
Total Bilirubin: 0.8 mg/dL (ref 0.2–1.2)
Total Protein: 6.6 g/dL (ref 6.0–8.3)

## 2021-03-15 LAB — HEMOGLOBIN A1C: Hgb A1c MFr Bld: 5.9 % (ref 4.6–6.5)

## 2021-03-15 LAB — TSH: TSH: 1.33 u[IU]/mL (ref 0.35–5.50)

## 2021-03-15 LAB — LIPID PANEL
Cholesterol: 136 mg/dL (ref 0–200)
HDL: 65.2 mg/dL (ref >39.00)
LDL Cholesterol: 51 mg/dL (ref 0–99)
NonHDL: 70.89
Total CHOL/HDL Ratio: 2
Triglycerides: 100 mg/dL (ref 0.0–149.0)
VLDL: 20 mg/dL (ref 0.0–40.0)

## 2021-03-15 MED ORDER — SHINGRIX 50 MCG/0.5ML IM SUSR: 0.5000 mL | Freq: Once | INTRAMUSCULAR | 0 refills | Status: AC

## 2021-03-15 NOTE — Patient Instructions (Signed)
Please return in 3 months for recheck.  ° °If you have any questions or concerns, please don't hesitate to send me a message via MyChart or call the office at [phone]. Thank you for visiting with us today! It's our pleasure caring for you.  °

## 2021-03-15 NOTE — Progress Notes (Signed)
Subjective  CC:  Chief Complaint  Patient presents with   Hyperlipidemia   Lumbosacral Radiculopathy   Hypertension    HPI: Amber [name] is a 80 y.o. female who presents to the office today to address the problems listed above in the chief complaint. Hypertension f/u: Control is good . Pt reports she is doing well. taking medications as instructed, no medication side effects noted, no TIAs, no chest pain on exertion, no dyspnea on exertion, no swelling of ankles. She denies adverse effects from his BP medications. Compliance with medication is good.  Back and hip pain: ongoing issue and still see ortho. Doing better but on tid oxycontin. Offered a procedure to help: she is trying to decide between her options. She did fall last week. Uses a walker at home.  Fatigue: can do much less than before. No cp or doe or paresis. GERD is controlled Due for recheck of hypothyroidism. Feels it is well controlled Prediabetes: has gained weight. More sedentary due to pain over last 6 months. No sxs of hyperglycemia.  HLD on statin. Due for recheck Medications: pt is unclear of which meds she is on. Daughter helps with this  Assessment  1. Essential hypertension   2. Prediabetes   3. Mixed hyperlipidemia   4. Acquired hypothyroidism   5. Gastroesophageal reflux disease, unspecified whether esophagitis present   6. Lumbosacral radiculopathy at S1   7. Need for immunization against influenza      Plan   Hypertension f/u: BP control is well controlled. Pt to send in meds so I can have correct dosage. Should be on hctz and ace. Check renal function and electrolytes. Recheck thyroid on levothyroxine.  Hyperlipidemia f/u: tolerating atrovastatin 20 nihghlty. Recheck today nonfasting F/u dr. Ethelene Hal for procedure to help radicular pain and trochanteric bursitis. On chornic narcotics Deconditioning: discussed exercise: in chair to elevate heart rate and increase endurance.  Flu shot today and  printed shingrix vaccination rx Discused ADLs and IADLs. Pt to discuss with family getting help in home as she lives independently.  Defer dexa until feeling better able to get around and pain is better controlled.   I spent a total of 52 minutes for this patient encounter. Time spent included preparation, face-to-face counseling with the patient and coordination of care, review of chart and records, and documentation of the encounter.  Education regarding management of these chronic disease states was given. Management strategies discussed on successive visits include dietary and exercise recommendations, goals of achieving and maintaining IBW, and lifestyle modifications aiming for adequate sleep and minimizing stressors.   Follow up: 3 mo for recheck.   Orders Placed This Encounter  Procedures   Flu Vaccine QUAD High Dose(Fluad)   Comprehensive metabolic panel   Lipid panel   Hemoglobin A1c   TSH   CBC with Differential/Platelet    Meds ordered this encounter  Medications   Zoster Vaccine Adjuvanted [facility]) injection    Sig: Inject 0.5 mLs into the muscle once for 1 dose. Please give 2nd dose 2-6 months after first dose    Dispense:  2 each    Refill:  0       BP Readings from Last 3 Encounters:  03/15/21 128/68  12/06/20 120/64  11/01/20 122/80   Wt Readings from Last 3 Encounters:  03/15/21 187 lb (84.8 kg)  12/06/20 182 lb 6.4 oz (82.7 kg)  11/01/20 181 lb (82.1 kg)    Lab Results  Component Value Date   CHOL  133 03/17/2020   CHOL 109 02/17/2019   Lab Results  Component Value Date   HDL 53 03/17/2020   HDL 44.90 02/17/2019   Lab Results  Component Value Date   LDLCALC 59 03/17/2020   LDLCALC 32 02/17/2019   Lab Results  Component Value Date   TRIG 119 03/17/2020   TRIG 157.0 (H) 02/17/2019   Lab Results  Component Value Date   CHOLHDL 2 02/17/2019   No results found for: LDLDIRECT Lab Results  Component Value Date   CREATININE 0.58  12/06/2020   BUN 16 12/06/2020   NA 135 12/06/2020   K 3.6 12/06/2020   CL 96 12/06/2020   CO2 29 12/06/2020    The ASCVD Risk score (Arnett DK, et al., 2019) failed to calculate for the following reasons:   The 2019 ASCVD risk score is only valid for ages 22 to 59  I reviewed the patients updated PMH, FH, and SocHx.    Patient Active Problem List   Diagnosis Date Noted   Lumbosacral radiculopathy at S1 12/06/2020    Priority: High   Mixed hyperlipidemia 12/06/2020    Priority: High   Class 1 obesity due to excess calories in adult 03/29/2020    Priority: High   Prediabetes 03/18/2020    Priority: High   Essential hypertension 12/08/2015    Priority: High   Acquired hypothyroidism 12/08/2015    Priority: High   Peripheral vascular disease, unspecified (HCC) 04/18/2013    Priority: High   Osteopenia after menopause 12/06/2020    Priority: Medium    Laryngopharyngeal reflux (LPR) 03/11/2019    Priority: Medium    Gastroesophageal reflux disease 01/19/2016    Priority: Medium    Hoarseness, chronic 12/28/2015    Priority: Medium    Complete left bundle branch block 12/08/2015    Priority: Medium    Chronic venous insufficiency 04/18/2013    Priority: Medium    Hyponatremia 12/06/2020    Allergies: Codeine, Darvocet [propoxyphene n-acetaminophen], Vicodin [hydrocodone-acetaminophen], Nickel, and Sulfa antibiotics  Social History: Patient  reports that she quit smoking about 56 years ago. Her smoking use included cigarettes. She has a 3.25 pack-year smoking history. She has never used smokeless tobacco. She reports that she does not drink alcohol and does not use drugs.  Current Meds  Medication Sig   acetaminophen (TYLENOL) 500 MG tablet Take 500-1,000 mg by mouth See admin instructions. Take 500mg  in the AM and 1000mg  in the PM.   aspirin 81 MG chewable tablet Chew 81 mg by mouth at bedtime.    atorvastatin (LIPITOR) 20 MG tablet TAKE 1 TABLET AT BEDTIME   B Complex  Vitamins (VITAMIN-B COMPLEX) TABS Take 1 tablet by mouth daily.    Cholecalciferol (VITAMIN D3) 1000 units CAPS Take 1,000 Units by mouth daily.    clobetasol ointment (TEMOVATE) 0.05 % clobetasol 0.05 % topical ointment   cyclobenzaprine (FLEXERIL) 10 MG tablet Take 1 tablet (10 mg total) by mouth 3 (three) times daily as needed for muscle spasms.   dexamethasone (DECADRON) 4 MG tablet Take 4 mg by mouth daily.   gabapentin (NEURONTIN) 100 MG capsule TAKE 1 CAPSULE(100 MG) BY MOUTH THREE TIMES DAILY   levothyroxine (SYNTHROID) 50 MCG tablet Take 1 tablet (50 mcg total) by mouth daily before breakfast.   losartan-hydrochlorothiazide (HYZAAR) 50-12.5 MG tablet TAKE 1 TABLET DAILY   Multiple Vitamins-Minerals (PRESERVISION AREDS PO) Take 1 tablet by mouth 2 (two) times daily.    nystatin (MYCOSTATIN/NYSTOP) powder Nyamyc 100,000 unit/gram  topical powder   oxyCODONE (OXY IR/ROXICODONE) 5 MG immediate release tablet Take 1 tablet (5 mg total) by mouth 2 (two) times daily as needed for severe pain.   pantoprazole (PROTONIX) 40 MG tablet Take 1 tablet (40 mg total) by mouth daily.   polyvinyl alcohol (LIQUIFILM TEARS) 1.4 % ophthalmic solution Place 1 drop into both eyes daily as needed for dry eyes.   traMADol (ULTRAM) 50 MG tablet Take 1 tablet (50 mg total) by mouth every 8 (eight) hours as needed.   vitamin C (ASCORBIC ACID) 500 MG tablet Take 500 mg by mouth daily.   Zoster Vaccine Adjuvanted [facility]) injection Inject 0.5 mLs into the muscle once for 1 dose. Please give 2nd dose 2-6 months after first dose    Review of Systems: Cardiovascular: negative for chest pain, palpitations, leg swelling, orthopnea Respiratory: negative for SOB, wheezing or persistent cough Gastrointestinal: negative for abdominal pain Genitourinary: negative for dysuria or gross hematuria  Objective  Vitals: BP 128/68   Pulse 76   Temp (!) 97.4 F (36.3 C) (Temporal)   Ht 5\' 2"  (1.575 m)   Wt 187 lb (84.8 kg)    LMP  (LMP Unknown)   SpO2 100%   BMI 34.20 kg/m  General: no acute distress  Psych:  Alert and oriented, normal mood and affect HEENT:  Normocephalic, atraumatic, supple neck  Cardiovascular:  RRR without murmur. no edema Respiratory:  Good breath sounds bilaterally, CTAB with normal respiratory effort Skin:  Warm, no rashes Neurologic:   Mental status is normal Commons side effects, risks, benefits, and alternatives for medications and treatment plan prescribed today were discussed, and the patient expressed understanding of the given instructions. Patient is instructed to call or message via MyChart if he/she has any questions or concerns regarding our treatment plan. No barriers to understanding were identified. We discussed Red Flag symptoms and signs in detail. Patient expressed understanding regarding what to do in case of urgent or emergency type symptoms.  Medication list was reconciled, printed and provided to the patient in AVS. Patient instructions and summary information was reviewed with the patient as documented in the AVS. This note was prepared with assistance of Dragon voice recognition software. Occasional wrong-word or sound-a-like substitutions may have occurred due to the inherent limitations of voice recognition software  This visit occurred during the SARS-CoV-2 public health emergency.  Safety protocols were in place, including screening questions prior to the visit, additional usage of staff PPE, and extensive cleaning of exam room while observing appropriate contact time as indicated for disinfecting solutions.

## 2021-04-04 ENCOUNTER — Other Ambulatory Visit: Payer: Self-pay | Admitting: Family Medicine

## 2021-04-04 DIAGNOSIS — I10 Essential (primary) hypertension: Secondary | ICD-10-CM

## 2021-04-11 ENCOUNTER — Other Ambulatory Visit: Payer: Self-pay | Admitting: Family Medicine

## 2021-04-11 DIAGNOSIS — I10 Essential (primary) hypertension: Secondary | ICD-10-CM

## 2021-04-29 ENCOUNTER — Telehealth: Payer: Self-pay

## 2021-04-29 MED ORDER — LOSARTAN POTASSIUM-HCTZ 50-12.5 MG PO TABS: 1.0000 | ORAL_TABLET | Freq: Every day | ORAL | 1 refills | Status: DC

## 2021-04-29 NOTE — Addendum Note (Signed)
Addended by: Katharine Look on: 04/29/2021 03:27 PM   Modules accepted: Orders

## 2021-04-29 NOTE — Telephone Encounter (Signed)
LAST APPOINTMENT DATE: 03/15/21  NEXT APPOINTMENT DATE:06/22/21  MEDICATION:losartan-hydrochlorothiazide (HYZAAR) 50-12.5 MG tablet  Hydrochlorothiade  PHARMACY: [facility] - [place], [place] - [address]

## 2021-06-17 ENCOUNTER — Ambulatory Visit: Payer: Medicare Other | Admitting: Family Medicine

## 2021-06-22 ENCOUNTER — Ambulatory Visit (INDEPENDENT_AMBULATORY_CARE_PROVIDER_SITE_OTHER): Payer: Medicare Other | Admitting: Family Medicine

## 2021-06-22 ENCOUNTER — Other Ambulatory Visit: Payer: Self-pay

## 2021-06-22 ENCOUNTER — Encounter: Payer: Self-pay | Admitting: Family Medicine

## 2021-06-22 VITALS — BP 112/62 | HR 103 | Temp 96.7°F | Ht 63.0 in | Wt 186.8 lb

## 2021-06-22 DIAGNOSIS — R5381 Other malaise: Secondary | ICD-10-CM

## 2021-06-22 DIAGNOSIS — R7303 Prediabetes: Secondary | ICD-10-CM

## 2021-06-22 DIAGNOSIS — M5417 Radiculopathy, lumbosacral region: Secondary | ICD-10-CM

## 2021-06-22 DIAGNOSIS — I10 Essential (primary) hypertension: Secondary | ICD-10-CM | POA: Diagnosis not present

## 2021-06-22 DIAGNOSIS — Z23 Encounter for immunization: Secondary | ICD-10-CM | POA: Diagnosis not present

## 2021-06-22 DIAGNOSIS — E782 Mixed hyperlipidemia: Secondary | ICD-10-CM | POA: Diagnosis not present

## 2021-06-22 DIAGNOSIS — L820 Inflamed seborrheic keratosis: Secondary | ICD-10-CM | POA: Diagnosis not present

## 2021-06-22 NOTE — Addendum Note (Signed)
Addended by: Candie Chroman on: 06/22/2021 04:33 PM   Modules accepted: Orders

## 2021-06-22 NOTE — Progress Notes (Signed)
Subjective  CC:  Chief Complaint  Patient presents with   Hypertension    HPI: Amber [name] is a 81 y.o. female who presents to the office today to address the problems listed above in the chief complaint. Hypertension f/u: Control is good . Pt reports she is doing well. taking medications as instructed, no medication side effects noted, no TIAs, no chest pain on exertion, no dyspnea on exertion, no swelling of ankles. She denies adverse effects from his BP medications. Compliance with medication is good.  She continues to do well on statin.  No myalgias Main persistent problem is low back pain.  Overall is much better but she still has tightening in the low back which is very limiting.  Her stamina remains very low.  She uses a cane to ambulate.  Does not have a Rollator but I think that would be helpful.  No recent falls.  Still requiring oxycodone four times daily.  She has not tried to wean from this.  The specialist has been prescribing.  She did have what sounds like epidural steroid injections which were helpful. History of prediabetes last check in October which was stable.  No symptoms of hyperglycemia now. Has an inflamed lesion on the right side of her face just above her eye.  She would like this treated if possible. Deconditioning  Assessment  1. Essential hypertension   2. Mixed hyperlipidemia   3. Lumbosacral radiculopathy at S1   4. Prediabetes   5. Seborrheic keratosis, inflamed   6. Physical deconditioning      Plan   Hypertension f/u: BP control is well controlled.  Continue Arb inhibitor HCTZ combination, Hyzaar 50/12.5 daily Hyperlipidemia f/u: Controlled on atorvastatin 20 mg nightly Low back pain: Continue to work with specialist.  Try to start weaning down from oxycodone slowly.  Recommend Rollator.  Patient to think about it. Prediabetes is clinically stable.  Recheck in the future S/p cryotherapy to seborrheic keratoses.  Tolerated well.  Routine  postprocedure instructions given. Physical deconditioning: Hopefully once pain is better controlled she will be able to become more active and mobile again.  Education regarding management of these chronic disease states was given. Management strategies discussed on successive visits include dietary and exercise recommendations, goals of achieving and maintaining IBW, and lifestyle modifications aiming for adequate sleep and minimizing stressors.   Follow up: Return in about 6 months (around 12/20/2021) for follow up Hypertension, recheck.  No orders of the defined types were placed in this encounter.  No orders of the defined types were placed in this encounter.     BP Readings from Last 3 Encounters:  06/22/21 112/62  03/15/21 128/68  12/06/20 120/64   Wt Readings from Last 3 Encounters:  06/22/21 186 lb 12.8 oz (84.7 kg)  03/15/21 187 lb (84.8 kg)  12/06/20 182 lb 6.4 oz (82.7 kg)    Lab Results  Component Value Date   CHOL 136 03/15/2021   CHOL 133 03/17/2020   CHOL 109 02/17/2019   Lab Results  Component Value Date   HDL 65.20 03/15/2021   HDL 53 03/17/2020   HDL 44.90 02/17/2019   Lab Results  Component Value Date   LDLCALC 51 03/15/2021   LDLCALC 59 03/17/2020   LDLCALC 32 02/17/2019   Lab Results  Component Value Date   TRIG 100.0 03/15/2021   TRIG 119 03/17/2020   TRIG 157.0 (H) 02/17/2019   Lab Results  Component Value Date   CHOLHDL 2 03/15/2021  CHOLHDL 2 02/17/2019   No results found for: LDLDIRECT Lab Results  Component Value Date   CREATININE 0.76 03/15/2021   BUN 20 03/15/2021   NA 134 (L) 03/15/2021   K 3.6 03/15/2021   CL 94 (L) 03/15/2021   CO2 31 03/15/2021    The ASCVD Risk score (Arnett DK, et al., 2019) failed to calculate for the following reasons:   The 2019 ASCVD risk score is only valid for ages 73 to 96  I reviewed the patients updated PMH, FH, and SocHx.    Patient Active Problem List   Diagnosis Date Noted    Lumbosacral radiculopathy at S1 12/06/2020    Priority: High   Mixed hyperlipidemia 12/06/2020    Priority: High   Class 1 obesity due to excess calories in adult 03/29/2020    Priority: High   Prediabetes 03/18/2020    Priority: High   Essential hypertension 12/08/2015    Priority: High   Acquired hypothyroidism 12/08/2015    Priority: High   Peripheral vascular disease, unspecified ([place]) 04/18/2013    Priority: High   Osteopenia after menopause 12/06/2020    Priority: Medium    Laryngopharyngeal reflux (LPR) 03/11/2019    Priority: Medium    Gastroesophageal reflux disease 01/19/2016    Priority: Medium    Hoarseness, chronic 12/28/2015    Priority: Medium    Complete left bundle branch block 12/08/2015    Priority: Medium    Chronic venous insufficiency 04/18/2013    Priority: Medium    Hyponatremia 12/06/2020    Allergies: Codeine, Darvocet [propoxyphene n-acetaminophen], Vicodin [hydrocodone-acetaminophen], Nickel, and Sulfa antibiotics  Social History: Patient  reports that she quit smoking about 57 years ago. Her smoking use included cigarettes. She has a 3.25 pack-year smoking history. She has never used smokeless tobacco. She reports that she does not drink alcohol and does not use drugs.  Current Meds  Medication Sig   acetaminophen (TYLENOL) 500 MG tablet Take 500-1,000 mg by mouth See admin instructions. Take 500mg  in the AM and 1000mg  in the PM.   aspirin 81 MG chewable tablet Chew 81 mg by mouth at bedtime.    atorvastatin (LIPITOR) 20 MG tablet TAKE 1 TABLET AT BEDTIME   B Complex Vitamins (VITAMIN-B COMPLEX) TABS Take 1 tablet by mouth daily.    Cholecalciferol (VITAMIN D3) 1000 units CAPS Take 1,000 Units by mouth daily.    clobetasol ointment (TEMOVATE) 0.05 % clobetasol 0.05 % topical ointment   cyclobenzaprine (FLEXERIL) 10 MG tablet Take 1 tablet (10 mg total) by mouth 3 (three) times daily as needed for muscle spasms.   dexamethasone (DECADRON) 4 MG  tablet Take 4 mg by mouth daily.   gabapentin (NEURONTIN) 100 MG capsule TAKE 1 CAPSULE(100 MG) BY MOUTH THREE TIMES DAILY   levothyroxine (SYNTHROID) 50 MCG tablet Take 1 tablet (50 mcg total) by mouth daily before breakfast.   losartan-hydrochlorothiazide (HYZAAR) 50-12.5 MG tablet Take 1 tablet by mouth daily.   Multiple Vitamins-Minerals (PRESERVISION AREDS PO) Take 1 tablet by mouth 2 (two) times daily.    nystatin (MYCOSTATIN/NYSTOP) powder Nyamyc 100,000 unit/gram topical powder   oxyCODONE (OXY IR/ROXICODONE) 5 MG immediate release tablet Take 1 tablet (5 mg total) by mouth 2 (two) times daily as needed for severe pain.   pantoprazole (PROTONIX) 40 MG tablet Take 1 tablet (40 mg total) by mouth daily.   polyvinyl alcohol (LIQUIFILM TEARS) 1.4 % ophthalmic solution Place 1 drop into both eyes daily as needed for dry eyes.  traMADol (ULTRAM) 50 MG tablet Take 1 tablet (50 mg total) by mouth every 8 (eight) hours as needed.   vitamin C (ASCORBIC ACID) 500 MG tablet Take 500 mg by mouth daily.    Review of Systems: Cardiovascular: negative for chest pain, palpitations, leg swelling, orthopnea Respiratory: negative for SOB, wheezing or persistent cough Gastrointestinal: negative for abdominal pain Genitourinary: negative for dysuria or gross hematuria  Objective  Vitals: BP 112/62    Pulse (!) 103    Temp (!) 96.7 F (35.9 C) (Temporal)    Ht 5\' 3"  (1.6 m)    Wt 186 lb 12.8 oz (84.7 kg)    LMP  (LMP Unknown)    SpO2 96%    BMI 33.09 kg/m  General: no acute distress  Psych:  Alert and oriented, normal mood and affect HEENT:  Normocephalic, atraumatic, supple neck  Cardiovascular:  RRR without murmur. no edema Respiratory:  Good breath sounds bilaterally, CTAB with normal respiratory effort Skin:  Warm, no rashes, 4 mm seborrheic keratoses on the right side of her face noted. Neurologic:   Mental status is normal  Cryotherapy Procedure Note  Pre-operative Diagnosis: seb K,  inflamed  Post-operative Diagnosis: same  Locations: right side of face lateral to right eye  Indications: inflamed and bothersome  Anesthesia: none  Procedure Details   Patient informed of risks (permanent scarring, infection, light or dark discoloration, bleeding, infection, weakness, numbness and recurrence of the lesion) and benefits of the procedure and verbal informed consent obtained. Universal time out performed  The areas are treated with liquid nitrogen therapy, frozen until ice ball extended 2 mm beyond lesion, allowed to thaw, and treated again. The patient tolerated procedure well.  The patient was instructed on post-op care, warned that there may be blister formation, redness and pain. Recommend OTC analgesia as needed for pain.  Condition: Stable  Complications: none.  Commons side effects, risks, benefits, and alternatives for medications and treatment plan prescribed today were discussed, and the patient expressed understanding of the given instructions. Patient is instructed to call or message via MyChart if he/she has any questions or concerns regarding our treatment plan. No barriers to understanding were identified. We discussed Red Flag symptoms and signs in detail. Patient expressed understanding regarding what to do in case of urgent or emergency type symptoms.  Medication list was reconciled, printed and provided to the patient in AVS. Patient instructions and summary information was reviewed with the patient as documented in the AVS. This note was prepared with assistance of Dragon voice recognition software. Occasional wrong-word or sound-a-like substitutions may have occurred due to the inherent limitations of voice recognition software  This visit occurred during the SARS-CoV-2 public health emergency.  Safety protocols were in place, including screening questions prior to the visit, additional usage of staff PPE, and extensive cleaning of exam room while  observing appropriate contact time as indicated for disinfecting solutions.

## 2021-06-22 NOTE — Patient Instructions (Signed)
Please return in 6 months for hypertension follow up and recheck   Today you were given your Prevnar 20 vaccination.  This helps protect you from pneumonia.   If you have any questions or concerns, please don't hesitate to send me a message via MyChart or call the office at [phone]. Thank you for visiting with [place] today! It's our pleasure caring for you.

## 2021-07-08 ENCOUNTER — Telehealth: Payer: Self-pay

## 2021-07-08 ENCOUNTER — Other Ambulatory Visit: Payer: Self-pay

## 2021-07-08 MED ORDER — LOSARTAN POTASSIUM-HCTZ 50-12.5 MG PO TABS: 1.0000 | ORAL_TABLET | Freq: Every day | ORAL | 1 refills | Status: DC

## 2021-07-08 NOTE — Telephone Encounter (Signed)
Medication was sent to patient pharmacy.

## 2021-07-08 NOTE — Telephone Encounter (Signed)
MEDICATION: losartan-hydrochlorothiazide (HYZAAR) 50-12.5 MG tablet  PHARMACY:  [id number] - [place], [place] - [address] AT [facility] [address] [facility] [address] Phone:  [phone]  Fax:  [phone]      Comments: Needs it sent until [facility]      **Let patient know to contact pharmacy at the end of the day to make sure medication is ready. **  ** Please notify patient to allow 48-72 hours to process**  **Encourage patient to contact the pharmacy for refills or they can request refills through [facility]**

## 2021-08-23 ENCOUNTER — Other Ambulatory Visit: Payer: Self-pay

## 2021-08-24 NOTE — Progress Notes (Signed)
This encounter was created in error - please disregard.

## 2021-09-12 IMAGING — CT CT NECK W/ CM
5 of 6 series · 14 of 35 positions shown, 16 images · IV contrast (iopamidol)
Comparison: MRI 04/04/2018

CLINICAL DATA: Right submandibular pain and fullness, 2 years
duration.

Creatinine was obtained on site at [HOSPITAL] at [REDACTED].
Results: Creatinine 0.6 mg/dL.
EXAM:
CT NECK WITH CONTRAST
TECHNIQUE: Multidetector CT imaging of the neck was performed using the
standard protocol following the bolus administration of intravenous
contrast.
CONTRAST:  75mL YWHHW0-QDD IOPAMIDOL (YWHHW0-QDD) INJECTION 61%

[Series 2: neck 2.00 br40 s3 st/ no angle · axial · 0.44mm/px · z∈[-738,-656]mm · 2 of 124 slices shown, 3 images]
[im 42/124  soft-tissue]
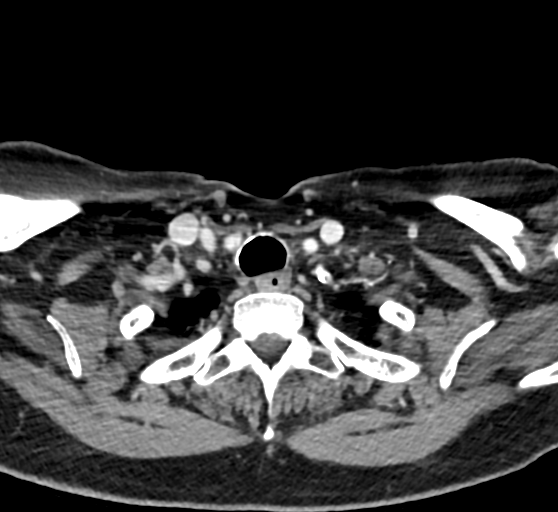
[im 42/124  bone]
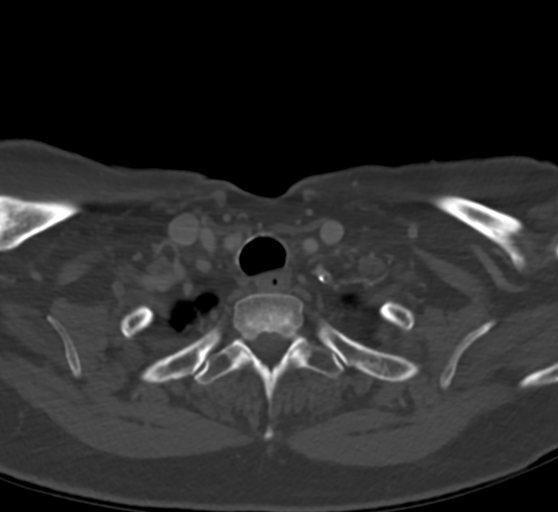
[im 83/124  bone]
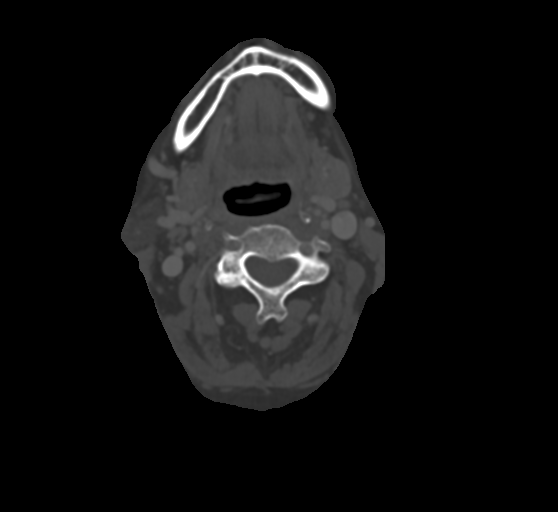

[Series 4: neck 2.00 br60 s3 bone/ no angle · axial · 0.44mm/px · z∈[-738,-656]mm · 2 of 124 slices shown]
[im 42/124  bone]
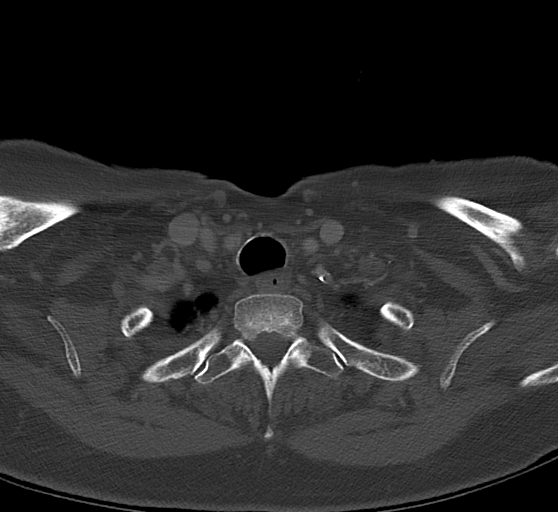
[im 83/124  bone]
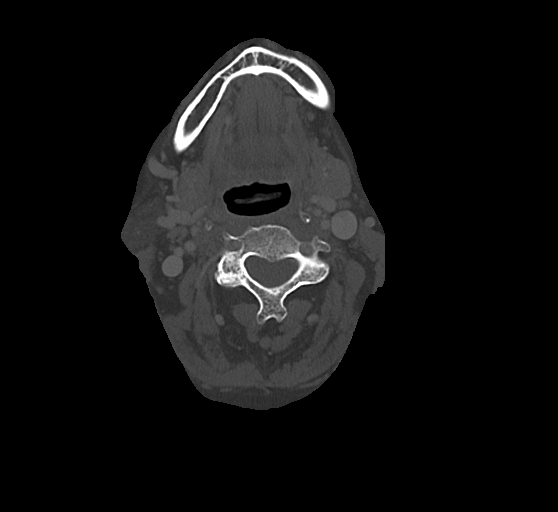

[Series 6: neck 2.00 br36 s3 angled axial (person_name) · axial · 0.44mm/px · z∈[-757,-676]mm · 2 of 124 slices shown]
[im 42/124  bone]
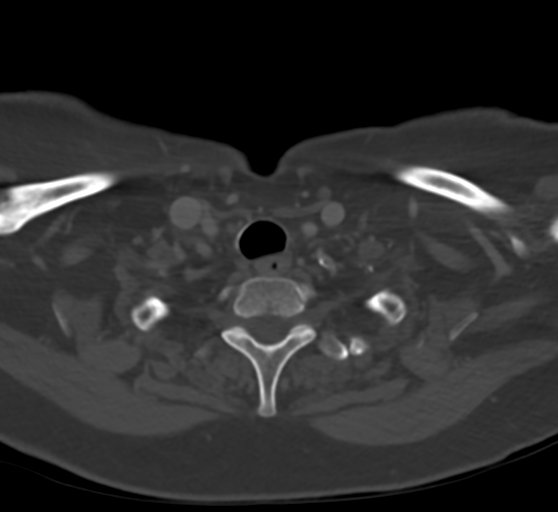
[im 83/124  bone]
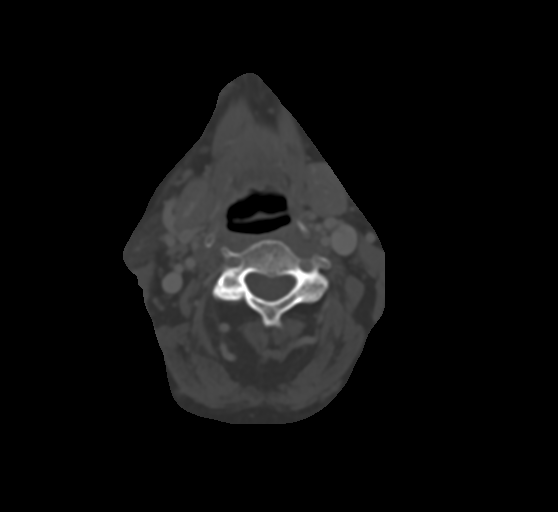

[Series 10: neck 2.00 br40 s3 (person_name) · coronal · 0.48mm/px · 3 of 112 slices shown (1 of 2)]
[im 23/112  bone]
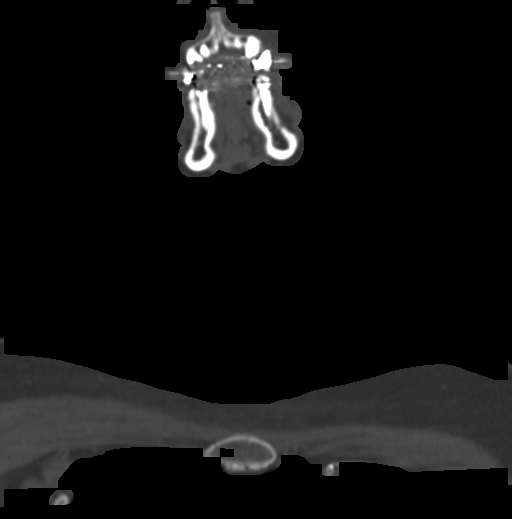
[im 45/112  bone]
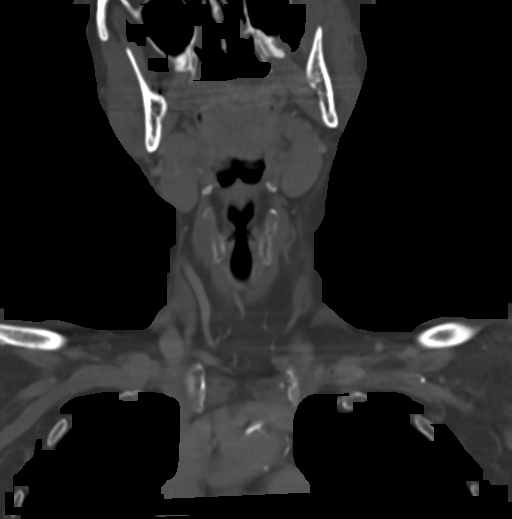
[im 67/112  bone]
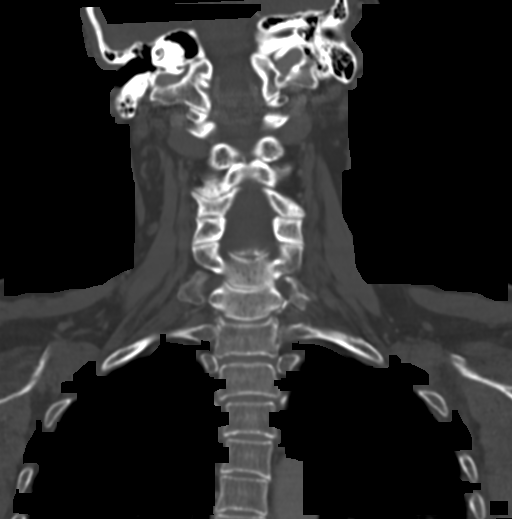

[Series 12: neck 2.00 br40 s3 (person_name) · sagittal · 0.44mm/px · 5 of 122 slices shown, 6 images (2 of 2)]
[im 41/122  bone]
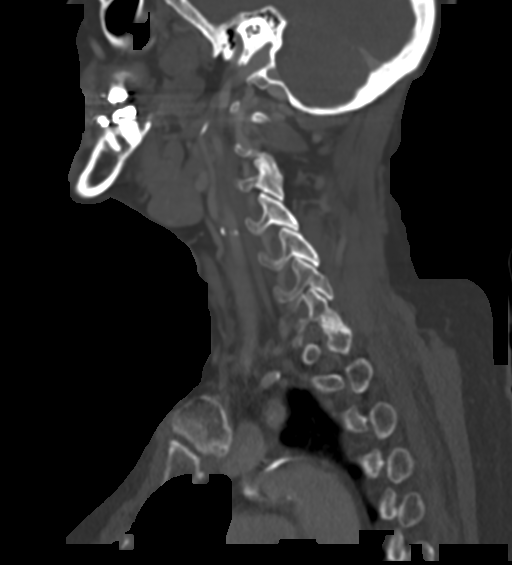
[im 51/122  bone]
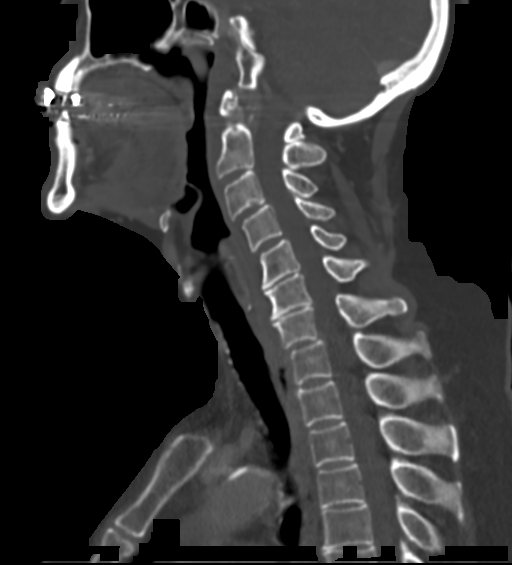
[im 61/122  soft-tissue]
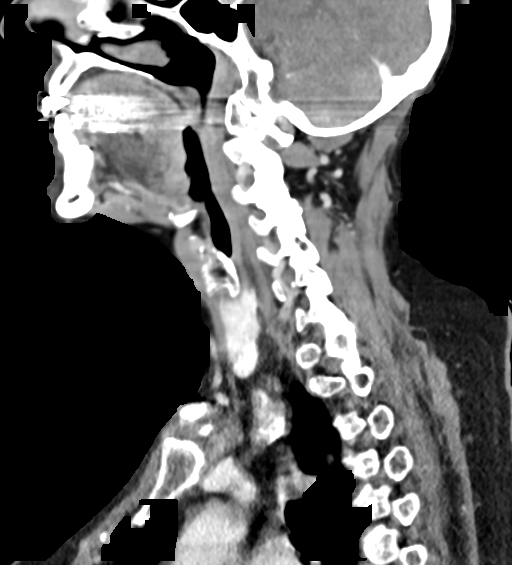
[im 61/122  bone]
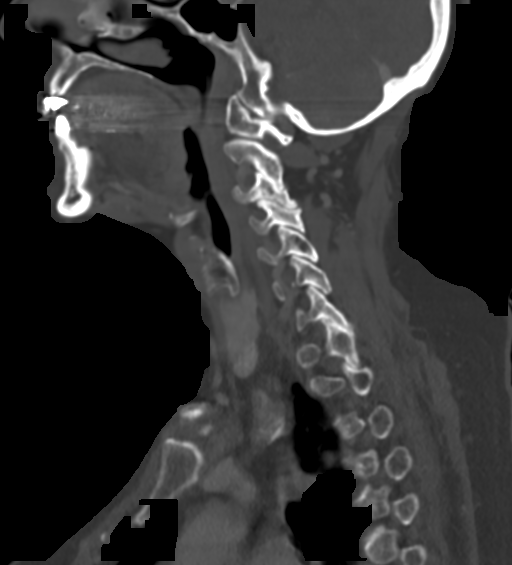
[im 71/122  bone]
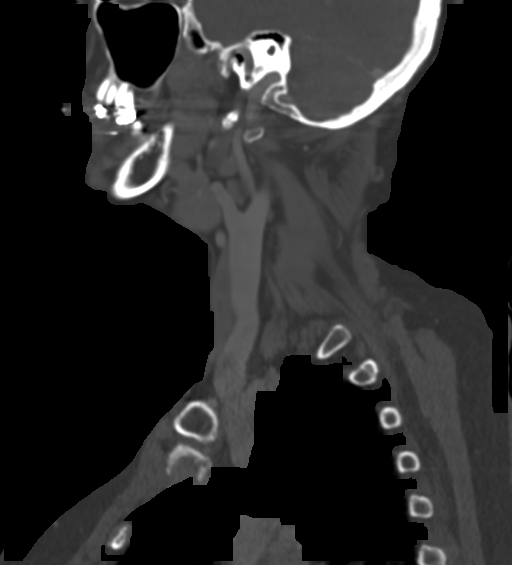
[im 81/122  bone]
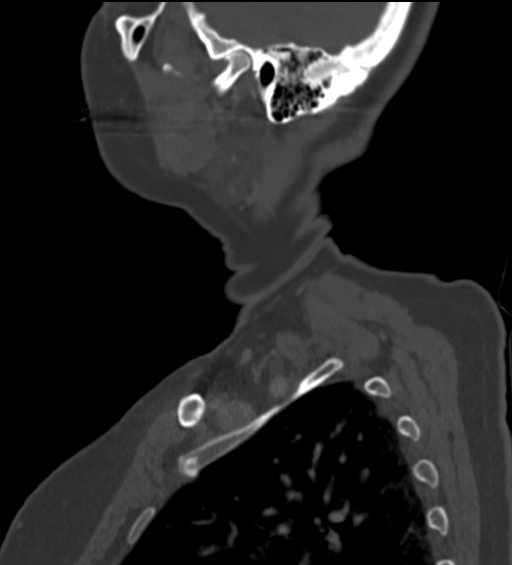

[14 of 35 positions shown; findings below may reference images not displayed]

FINDINGS: Pharynx and larynx: No mucosal or submucosal lesion.

Salivary glands: Parotid and submandibular glands are intrinsically
normal. Right submandibular gland is slightly low lying compared to
the left, a normal variant. No sign of mass, stone or inflammation.

Thyroid: Normal for age.

Lymph nodes: No enlarged or low-density nodes on either side of the
neck.

Vascular: Ordinary atherosclerotic change at the carotid
bifurcations but without evidence of stenosis.

Limited intracranial: Normal

Visualized orbits: Limited coverage.  Negative.

Mastoids and visualized paranasal sinuses: Clear

Skeleton: Ordinary cervical spondylosis.

Upper chest: Negative

Other: None
IMPRESSION: No pathologic finding. The right submandibular gland is slightly low
lying relative to the left but is intrinsically normal. This is
within normal variation. No evidence of adenopathy or other regional
lesion.

## 2021-09-13 ENCOUNTER — Telehealth: Payer: Self-pay | Admitting: Family Medicine

## 2021-09-13 NOTE — Telephone Encounter (Signed)
Regarding trembling hands, it's difficult to get ready. ? ?Would like to know if the offer of a medication to calm to shakes is still available.  ? ?Does pt need an OV? ? ? ?

## 2021-09-14 NOTE — Telephone Encounter (Signed)
LVM to schedule an office visit 

## 2021-09-21 ENCOUNTER — Encounter: Payer: Self-pay | Admitting: Family Medicine

## 2021-09-21 ENCOUNTER — Ambulatory Visit (INDEPENDENT_AMBULATORY_CARE_PROVIDER_SITE_OTHER): Payer: Medicare Other | Admitting: Family Medicine

## 2021-09-21 VITALS — BP 138/62 | HR 96 | Temp 98.3°F | Ht 63.0 in | Wt 176.0 lb

## 2021-09-21 DIAGNOSIS — G250 Essential tremor: Secondary | ICD-10-CM

## 2021-09-21 DIAGNOSIS — I10 Essential (primary) hypertension: Secondary | ICD-10-CM | POA: Diagnosis not present

## 2021-09-21 DIAGNOSIS — M5417 Radiculopathy, lumbosacral region: Secondary | ICD-10-CM | POA: Diagnosis not present

## 2021-09-21 DIAGNOSIS — K219 Gastro-esophageal reflux disease without esophagitis: Secondary | ICD-10-CM | POA: Diagnosis not present

## 2021-09-21 NOTE — Progress Notes (Signed)
? ?Subjective  ?CC:  ?Chief Complaint  ?Patient presents with  ? Tremors  ?  Pt stated that she feels like her tremors has gotten worse over time. She notices it more when she tries toi turn the pages of a book or trying to put her errings on  ? ? ?HPI: Amber [name] is a 81 y.o. female who presents to the office today to address the problems listed above in the chief complaint. ?Tremors: He has fine essential tremor most notably problematic when she is rushing to get ready prior to discharge.  Has difficulty putting on her earrings and at times will have problems turning the pages when she is reading.  Does not affect her most days of the week. ?Hypertension has been well controlled ?Complains of thick mucus in her mouth at times.  Some heartburn.  On Protonix 40 daily.  Has history of LPR.  Also with some allergy symptoms. ?Back pain continues to be her main problem.  Managed by Ortho.  Continues on 4 times daily oxycodone but prefers tramadol. ? ? ?Assessment  ?1. Essential tremor   ?2. Essential hypertension   ?3. Laryngopharyngeal reflux (LPR)   ?4. Gastroesophageal reflux disease, unspecified whether esophagitis present   ?5. Lumbosacral radiculopathy at S1   ? ?  ?Plan  ?Essential tremor: Education given.  Mild.  Rarely symptomatic.  Will monitor. ?Hypertension: Controlled ?LPR and GERD: Continue high-dose PPI.  add Zyrtec nightly ?Low back pain: Follow-up with Dr. Horald Chestnut, recommend transition to tramadol. ? ?Follow up: As scheduled ?12/13/2021 ? ?No orders of the defined types were placed in this encounter. ? ?No orders of the defined types were placed in this encounter. ? ?  ? ?I reviewed the patients updated PMH, FH, and SocHx.  ?  ?Patient Active Problem List  ? Diagnosis Date Noted  ? Lumbosacral radiculopathy at S1 12/06/2020  ?  Priority: High  ? Mixed hyperlipidemia 12/06/2020  ?  Priority: High  ? Class 1 obesity due to excess calories in adult 03/29/2020  ?  Priority: High  ? Prediabetes 03/18/2020   ?  Priority: High  ? Essential hypertension 12/08/2015  ?  Priority: High  ? Acquired hypothyroidism 12/08/2015  ?  Priority: High  ? Peripheral vascular disease, unspecified ([facility]) 04/18/2013  ?  Priority: High  ? Osteopenia after menopause 12/06/2020  ?  Priority: Medium   ? Laryngopharyngeal reflux (LPR) 03/11/2019  ?  Priority: Medium   ? Gastroesophageal reflux disease 01/19/2016  ?  Priority: Medium   ? Hoarseness, chronic 12/28/2015  ?  Priority: Medium   ? Complete left bundle branch block 12/08/2015  ?  Priority: Medium   ? Chronic venous insufficiency 04/18/2013  ?  Priority: Medium   ? Hyponatremia 12/06/2020  ? ?Current Meds  ?Medication Sig  ? acetaminophen (TYLENOL) 500 MG tablet Take 500-1,000 mg by mouth See admin instructions. Take 500mg  in the AM and 1000mg  in the PM.  ? aspirin 81 MG chewable tablet Chew 81 mg by mouth at bedtime.   ? atorvastatin (LIPITOR) 20 MG tablet TAKE 1 TABLET AT BEDTIME  ? B Complex Vitamins (VITAMIN-B COMPLEX) TABS Take 1 tablet by mouth daily.   ? Cholecalciferol (VITAMIN D3) 1000 units CAPS Take 1,000 Units by mouth daily.   ? clobetasol ointment (TEMOVATE) 0.05 % clobetasol 0.05 % topical ointment  ? cyclobenzaprine (FLEXERIL) 10 MG tablet Take 1 tablet (10 mg total) by mouth 3 (three) times daily as needed for muscle spasms.  ?  gabapentin (NEURONTIN) 100 MG capsule TAKE 1 CAPSULE(100 MG) BY MOUTH THREE TIMES DAILY  ? levothyroxine (SYNTHROID) 50 MCG tablet Take 1 tablet (50 mcg total) by mouth daily before breakfast.  ? losartan-hydrochlorothiazide (HYZAAR) 50-12.5 MG tablet Take 1 tablet by mouth daily.  ? Multiple Vitamins-Minerals (PRESERVISION AREDS PO) Take 1 tablet by mouth 2 (two) times daily.   ? nystatin (MYCOSTATIN/NYSTOP) powder Nyamyc 100,000 unit/gram topical powder  ? oxyCODONE (OXY IR/ROXICODONE) 5 MG immediate release tablet Take 1 tablet (5 mg total) by mouth 2 (two) times daily as needed for severe pain.  ? pantoprazole (PROTONIX) 40 MG tablet Take 1  tablet (40 mg total) by mouth daily.  ? polyvinyl alcohol (LIQUIFILM TEARS) 1.4 % ophthalmic solution Place 1 drop into both eyes daily as needed for dry eyes.  ? traMADol (ULTRAM) 50 MG tablet Take 1 tablet (50 mg total) by mouth every 8 (eight) hours as needed.  ? vitamin C (ASCORBIC ACID) 500 MG tablet Take 500 mg by mouth daily.  ? [DISCONTINUED] dexamethasone (DECADRON) 4 MG tablet Take 4 mg by mouth daily.  ? ? ?Allergies: ?Patient is allergic to codeine, darvocet [propoxyphene n-acetaminophen], vicodin [hydrocodone-acetaminophen], nickel, and sulfa antibiotics. ?Family History: ?Patient family history includes AAA (abdominal aortic aneurysm) in her brother and father; Cancer in her mother and sister; Deep vein thrombosis in her mother; Depression in her mother; Diabetes in her brother, mother, sister, sister, and sister; Heart disease in her father, mother, and sister; Heart disease (age of onset: 70) in her sister; Hypertension in her mother, sister, and sister; Obesity in her mother; Peripheral vascular disease in her brother; Varicose Veins in her mother and sister; Vision loss in her brother, sister, sister, and sister. ?Social History:  ?Patient  reports that she quit smoking about 57 years ago. Her smoking use included cigarettes. She has a 3.25 pack-year smoking history. She has never used smokeless tobacco. She reports that she does not drink alcohol and does not use drugs. ? ?Review of Systems: ?Constitutional: Negative for fever malaise or anorexia ?Cardiovascular: negative for chest pain ?Respiratory: negative for SOB or persistent cough ?Gastrointestinal: negative for abdominal pain ? ?Objective  ?Vitals: BP 138/62   Pulse 96   Temp 98.3 ?F (36.8 ?C)   Ht 5\' 3"  (1.6 m)   Wt 176 lb (79.8 kg)   LMP  (LMP Unknown)   SpO2 96%   BMI 31.18 kg/m?  ?General: no acute distress , A&Ox3, hoarse voice ?Neuro: Mild fine intention tremor bilateral upper extremities ? ? ?Commons side effects, risks,  benefits, and alternatives for medications and treatment plan prescribed today were discussed, and the patient expressed understanding of the given instructions. Patient is instructed to call or message via MyChart if he/she has any questions or concerns regarding our treatment plan. No barriers to understanding were identified. We discussed Red Flag symptoms and signs in detail. Patient expressed understanding regarding what to do in case of urgent or emergency type symptoms.  ?Medication list was reconciled, printed and provided to the patient in AVS. Patient instructions and summary information was reviewed with the patient as documented in the AVS. ?This note was prepared with assistance of . Occasional wrong-word or sound-a-like substitutions may have occurred due to the inherent limitations of voice recognition software ? ?This visit occurred during the SARS-CoV-2 public health emergency.  Safety protocols were in place, including screening questions prior to the visit, additional usage of staff PPE, and extensive cleaning of exam room  while observing appropriate contact time as indicated for disinfecting solutions.  ? ? ?

## 2021-09-21 NOTE — Patient Instructions (Addendum)
Please follow up as scheduled for your next visit with me: 12/07/2021 for recheck ? ?Start OTC zyrtec 10mg  nightly.  ? ?If you have any questions or concerns, please don't hesitate to send me a message via MyChart or call the office at [phone]. Thank you for visiting with [phone] today! It's our pleasure caring for you.  ? ?Essential Tremor ?A tremor is trembling or shaking that a person cannot control. Most tremors affect the hands or arms. Tremors can also affect the head, vocal cords, legs, and other parts of the body. Essential tremor is a tremor without a known cause. Usually, it occurs while a person is trying to perform an action. It tends to get worse gradually as a person ages. ?What are the causes? ?The cause of this condition is not known, but it often runs in families. ?What increases the risk? ?You are more likely to develop this condition if: ?You have a family member with essential tremor. ?You are 76 years of age or older. ?What are the signs or symptoms? ?The main sign of a tremor is a rhythmic shaking of certain parts of your body that is uncontrolled and unintentional. You may: ?Have difficulty eating with a spoon or fork. ?Have difficulty writing. ?Nod your head up and down or side to side. ?Have a quivering voice. ?The shaking may: ?Get worse over time. ?Come and go. ?Be more noticeable on one side of your body. ?Get worse due to stress, tiredness (fatigue), caffeine, and extreme heat or cold. ?How is this diagnosed? ?This condition may be diagnosed based on: ?Your symptoms and medical history. ?A physical exam. ?There is no single test to diagnose an essential tremor. However, your health care provider may order tests to rule out other causes of your condition. These may include: ?Blood and urine tests. ?Imaging studies of your brain, such as a CT scan or MRI. ?How is this treated? ?Treatment for essential tremor depends on the severity of the condition. ?Mild tremors may not need treatment if  they do not affect your day-to-day life. ?Severe tremors may need to be treated using one or more of the following options: ?Medicines. ?Injections of a substance called botulinum toxin. ?Procedures such as deep brain stimulation (DBS) implantation or MRI-guided ultrasound treatment. ?Lifestyle changes. ?Occupational or physical therapy. ?Follow these instructions at home: ?Lifestyle ? ?Do not use any products that contain nicotine or tobacco. These products include cigarettes, chewing tobacco, and vaping devices, such as e-cigarettes. If you need help quitting, ask your health care provider. ?Limit your caffeine intake as told by your health care provider. ?Try to get 8 hours of sleep each night. ?Find ways to manage your stress that fit your lifestyle and personality. Consider trying meditation or yoga. ?Try to anticipate stressful situations and allow extra time to manage them. ?If you are struggling emotionally with the effects of your tremor, consider working with a mental health provider. ?General instructions ?Take over-the-counter and prescription medicines only as told by your health care provider. ?Avoid extreme heat and extreme cold. ?Keep all follow-up visits. This is important. Visits may include physical therapy visits. ?Where to find more information ?41 of Neurological Disorders and Stroke: [employer] ?Contact a health care provider if: ?You experience any changes in the location or intensity of your tremors. ?You start having a tremor after starting a new medicine. ?You have a tremor with other symptoms, such as: ?Numbness. ?Tingling. ?Pain. ?Weakness. ?Your tremor gets worse. ?Your tremor interferes with your daily life. ?You  feel down, blue, or sad for at least 2 weeks in a row. ?Worrying about your tremor and what other people think about you interferes with your everyday life functions, including relationships, work, or school. ?Summary ?Essential tremor is a tremor  without a known cause. Usually, it occurs when you are trying to perform an action. ?You are more likely to develop this condition if you have a family member with essential tremor. ?The main sign of a tremor is a rhythmic shaking of certain parts of your body that is uncontrolled and unintentional. ?Treatment for essential tremor depends on the severity of the condition. ?This information is not intended to replace advice given to you by your health care provider. Make sure you discuss any questions you have with your health care provider. ?Document Revised: 03/04/2021 Document Reviewed: 03/04/2021 ?Elsevier Patient Education ? 2023 Elsevier Inc. ? ?

## 2021-09-26 ENCOUNTER — Other Ambulatory Visit: Payer: Self-pay | Admitting: Family Medicine

## 2021-10-04 ENCOUNTER — Other Ambulatory Visit: Payer: Self-pay

## 2021-10-04 DIAGNOSIS — I10 Essential (primary) hypertension: Secondary | ICD-10-CM

## 2021-10-04 MED ORDER — LOSARTAN POTASSIUM-HCTZ 50-12.5 MG PO TABS: 1.0000 | ORAL_TABLET | Freq: Every day | ORAL | 1 refills | Status: DC

## 2021-11-02 ENCOUNTER — Telehealth: Payer: Self-pay | Admitting: Family Medicine

## 2021-11-02 NOTE — Telephone Encounter (Signed)
..   Encourage patient to contact the pharmacy for refills or they can request refills through [facility]  LAST APPOINTMENT DATE:  09/21/21  NEXT APPOINTMENT DATE: 12/13/21  MEDICATION: levothyroxine (SYNTHROID) 50 MCG tablet   Is the patient out of medication? Has about a 3 week supply left--Pt would like to have a 90 day supply expedited to her home   PHARMACY: [facility] - [place], [place] - [address]  [address], [place] [address]  Phone:  [phone]  Fax:  [phone]   Let patient know to contact pharmacy at the end of the day to make sure medication is ready.  Please notify patient to allow 48-72 hours to process

## 2021-11-03 ENCOUNTER — Other Ambulatory Visit: Payer: Self-pay

## 2021-11-03 DIAGNOSIS — E039 Hypothyroidism, unspecified: Secondary | ICD-10-CM

## 2021-11-03 MED ORDER — LEVOTHYROXINE SODIUM 50 MCG PO TABS: 50.0000 ug | ORAL_TABLET | Freq: Every day | ORAL | 3 refills | Status: AC

## 2021-11-03 NOTE — Telephone Encounter (Signed)
Rx sent 

## 2021-11-08 ENCOUNTER — Ambulatory Visit (INDEPENDENT_AMBULATORY_CARE_PROVIDER_SITE_OTHER): Payer: Medicare Other

## 2021-11-08 DIAGNOSIS — Z0000 Encounter for general adult medical examination without abnormal findings: Secondary | ICD-10-CM | POA: Diagnosis not present

## 2021-11-08 NOTE — Patient Instructions (Signed)
[name] , Thank you for taking time to come for your Medicare Wellness Visit. I appreciate your ongoing commitment to your health goals. Please review the following plan we discussed and let me know if I can assist you in the future.   Screening recommendations/referrals: Colonoscopy: no longer required  Mammogram: no longer required  Bone Density: will scheduled after Dr visit  Recommended yearly ophthalmology/optometry visit for glaucoma screening and checkup Recommended yearly dental visit for hygiene and checkup  Vaccinations: Influenza vaccine: Done 03/15/21 repeat every year  Pneumococcal vaccine: Up to date Tdap vaccine: discontinued  Shingles vaccine: Shingrix discussed. Please contact your pharmacy for coverage information.    Covid-19:completed 07/15/19 & 08/05/19  Advanced directives: copies in chart   Conditions/risks identified: get rid of some medications and drive again   Next appointment: Follow up in one year for your annual wellness visit    Preventive Care 65 Years and Older, Female Preventive care refers to lifestyle choices and visits with your health care provider that can promote health and wellness. What does preventive care include? A yearly physical exam. This is also called an annual well check. Dental exams once or twice a year. Routine eye exams. Ask your health care provider how often you should have your eyes checked. Personal lifestyle choices, including: Daily care of your teeth and gums. Regular physical activity. Eating a healthy diet. Avoiding tobacco and drug use. Limiting alcohol use. Practicing safe sex. Taking low-dose aspirin every day. Taking vitamin and mineral supplements as recommended by your health care provider. What happens during an annual well check? The services and screenings done by your health care provider during your annual well check will depend on your age, overall health, lifestyle risk factors, and family history of  disease. Counseling  Your health care provider may ask you questions about your: Alcohol use. Tobacco use. Drug use. Emotional well-being. Home and relationship well-being. Sexual activity. Eating habits. History of falls. Memory and ability to understand (cognition). Work and work Astronomer. Reproductive health. Screening  You may have the following tests or measurements: Height, weight, and BMI. Blood pressure. Lipid and cholesterol levels. These may be checked every 5 years, or more frequently if you are over 9 years old. Skin check. Lung cancer screening. You may have this screening every year starting at age 18 if you have a 30-pack-year history of smoking and currently smoke or have quit within the past 15 years. Fecal occult blood test (FOBT) of the stool. You may have this test every year starting at age 39. Flexible sigmoidoscopy or colonoscopy. You may have a sigmoidoscopy every 5 years or a colonoscopy every 10 years starting at age 19. Hepatitis C blood test. Hepatitis B blood test. Sexually transmitted disease (STD) testing. Diabetes screening. This is done by checking your blood sugar (glucose) after you have not eaten for a while (fasting). You may have this done every 1-3 years. Bone density scan. This is done to screen for osteoporosis. You may have this done starting at age 73. Mammogram. This may be done every 1-2 years. Talk to your health care provider about how often you should have regular mammograms. Talk with your health care provider about your test results, treatment options, and if necessary, the need for more tests. Vaccines  Your health care provider may recommend certain vaccines, such as: Influenza vaccine. This is recommended every year. Tetanus, diphtheria, and acellular pertussis (Tdap, Td) vaccine. You may need a Td booster every 10 years. Zoster vaccine.  You may need this after age 44. Pneumococcal 13-valent conjugate (PCV13) vaccine. One  dose is recommended after age 56. Pneumococcal polysaccharide (PPSV23) vaccine. One dose is recommended after age 12. Talk to your health care provider about which screenings and vaccines you need and how often you need them. This information is not intended to replace advice given to you by your health care provider. Make sure you discuss any questions you have with your health care provider. Document Released: 06/11/2015 Document Revised: 02/02/2016 Document Reviewed: 03/16/2015 Elsevier Interactive Patient Education  2017 [place] Prevention in the Home Falls can cause injuries. They can happen to people of all ages. There are many things you can do to make your home safe and to help prevent falls. What can I do on the outside of my home? Regularly fix the edges of walkways and driveways and fix any cracks. Remove anything that might make you trip as you walk through a door, such as a raised step or threshold. Trim any bushes or trees on the path to your home. Use bright outdoor lighting. Clear any walking paths of anything that might make someone trip, such as rocks or tools. Regularly check to see if handrails are loose or broken. Make sure that both sides of any steps have handrails. Any raised decks and porches should have guardrails on the edges. Have any leaves, snow, or ice cleared regularly. Use sand or salt on walking paths during winter. Clean up any spills in your garage right away. This includes oil or grease spills. What can I do in the bathroom? Use night lights. Install grab bars by the toilet and in the tub and shower. Do not use towel bars as grab bars. Use non-skid mats or decals in the tub or shower. If you need to sit down in the shower, use a plastic, non-slip stool. Keep the floor dry. Clean up any water that spills on the floor as soon as it happens. Remove soap buildup in the tub or shower regularly. Attach bath mats securely with double-sided  non-slip rug tape. Do not have throw rugs and other things on the floor that can make you trip. What can I do in the bedroom? Use night lights. Make sure that you have a light by your bed that is easy to reach. Do not use any sheets or blankets that are too big for your bed. They should not hang down onto the floor. Have a firm chair that has side arms. You can use this for support while you get dressed. Do not have throw rugs and other things on the floor that can make you trip. What can I do in the kitchen? Clean up any spills right away. Avoid walking on wet floors. Keep items that you use a lot in easy-to-reach places. If you need to reach something above you, use a strong step stool that has a grab bar. Keep electrical cords out of the way. Do not use floor polish or wax that makes floors slippery. If you must use wax, use non-skid floor wax. Do not have throw rugs and other things on the floor that can make you trip. What can I do with my stairs? Do not leave any items on the stairs. Make sure that there are handrails on both sides of the stairs and use them. Fix handrails that are broken or loose. Make sure that handrails are as long as the stairways. Check any carpeting to make sure that it is  firmly attached to the stairs. Fix any carpet that is loose or worn. Avoid having throw rugs at the top or bottom of the stairs. If you do have throw rugs, attach them to the floor with carpet tape. Make sure that you have a light switch at the top of the stairs and the bottom of the stairs. If you do not have them, ask someone to add them for you. What else can I do to help prevent falls? Wear shoes that: Do not have high heels. Have rubber bottoms. Are comfortable and fit you well. Are closed at the toe. Do not wear sandals. If you use a stepladder: Make sure that it is fully opened. Do not climb a closed stepladder. Make sure that both sides of the stepladder are locked into place. Ask  someone to hold it for you, if possible. Clearly mark and make sure that you can see: Any grab bars or handrails. First and last steps. Where the edge of each step is. Use tools that help you move around (mobility aids) if they are needed. These include: Canes. Walkers. Scooters. Crutches. Turn on the lights when you go into a dark area. Replace any light bulbs as soon as they burn out. Set up your furniture so you have a clear path. Avoid moving your furniture around. If any of your floors are uneven, fix them. If there are any pets around you, be aware of where they are. Review your medicines with your doctor. Some medicines can make you feel dizzy. This can increase your chance of falling. Ask your doctor what other things that you can do to help prevent falls. This information is not intended to replace advice given to you by your health care provider. Make sure you discuss any questions you have with your health care provider. Document Released: 03/11/2009 Document Revised: 10/21/2015 Document Reviewed: 06/19/2014 Elsevier Interactive Patient Education  2017 [employer].

## 2021-11-08 NOTE — Progress Notes (Addendum)
Virtual Visit via Telephone Note  I connected with  Amber [name] on 11/08/21 at 10:30 AM EDT by telephone and verified that I am speaking with the correct person using two identifiers.  Medicare Annual Wellness visit completed telephonically due to Covid-19 pandemic.   Persons participating in this call: This Health Coach and this patient.   Location: Patient: home Provider: office   I discussed the limitations, risks, security and privacy concerns of performing an evaluation and management service by telephone and the availability of in person appointments. The patient expressed understanding and agreed to proceed.  Unable to perform video visit due to video visit attempted and failed and/or patient does not have video capability.   Some vital signs may be absent or patient reported.   Marzella Schlein, LPN   Subjective:   Amber [name] is a 81 y.o. female who presents for Medicare Annual (Subsequent) preventive examination.  Review of Systems     Cardiac Risk Factors include: advanced age (>36men, >71 women);dyslipidemia;hypertension;obesity (BMI >30kg/m2)     Objective:    There were no vitals filed for this visit. There is no height or weight on file to calculate BMI.     11/08/2021   10:38 AM 12/05/2019    1:39 PM 07/29/2019    1:14 PM 11/07/2018    1:48 PM 04/04/2018    7:55 AM 03/23/2017   11:05 AM 09/09/2014   12:30 PM  Advanced Directives  Does Patient Have a Medical Advance Directive? Yes Yes No Yes No Yes Yes  Type of Advance Directive Healthcare Power of Attorney Living will  Living will;Out of facility DNR (pink MOST or yellow form)  Healthcare Power of [place];Living will Living will  Does patient want to make changes to medical advance directive?      No - Patient declined No - Patient declined  Copy of Healthcare Power of Attorney in Chart? Yes - validated most recent copy scanned in chart (See row information)      No - copy requested  Would patient like  information on creating a medical advance directive?   No - Patient declined  No - Patient declined      Current Medications (verified) Outpatient Encounter Medications as of 11/08/2021  Medication Sig   acetaminophen (TYLENOL) 500 MG tablet Take 500-1,000 mg by mouth See admin instructions. Take 500mg  in the AM and 1000mg  in the PM.   aspirin 81 MG chewable tablet Chew 81 mg by mouth at bedtime.    atorvastatin (LIPITOR) 20 MG tablet TAKE 1 TABLET AT BEDTIME   B Complex Vitamins (VITAMIN-B COMPLEX) TABS Take 1 tablet by mouth daily.    Cholecalciferol (VITAMIN D3) 1000 units CAPS Take 1,000 Units by mouth daily.    clobetasol ointment (TEMOVATE) 0.05 % clobetasol 0.05 % topical ointment   cyclobenzaprine (FLEXERIL) 10 MG tablet Take 1 tablet (10 mg total) by mouth 3 (three) times daily as needed for muscle spasms.   gabapentin (NEURONTIN) 100 MG capsule TAKE 1 CAPSULE(100 MG) BY MOUTH THREE TIMES DAILY   levothyroxine (SYNTHROID) 50 MCG tablet Take 1 tablet (50 mcg total) by mouth daily before breakfast.   losartan-hydrochlorothiazide (HYZAAR) 50-12.5 MG tablet Take 1 tablet by mouth daily.   Multiple Vitamins-Minerals (PRESERVISION AREDS PO) Take 1 tablet by mouth 2 (two) times daily.    nystatin (MYCOSTATIN/NYSTOP) powder Nyamyc 100,000 unit/gram topical powder   oxyCODONE (OXY IR/ROXICODONE) 5 MG immediate release tablet Take 1 tablet (5 mg total) by mouth 2 (  two) times daily as needed for severe pain.   pantoprazole (PROTONIX) 40 MG tablet Take 1 tablet (40 mg total) by mouth daily.   polyvinyl alcohol (LIQUIFILM TEARS) 1.4 % ophthalmic solution Place 1 drop into both eyes daily as needed for dry eyes.   traMADol (ULTRAM) 50 MG tablet Take 1 tablet (50 mg total) by mouth every 8 (eight) hours as needed.   vitamin C (ASCORBIC ACID) 500 MG tablet Take 500 mg by mouth daily.   No facility-administered encounter medications on file as of 11/08/2021.    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   Rotator cuff rupture, complete 07/05/2011   Varicose veins    Past Surgical History:  Procedure Laterality Date   APPENDECTOMY  1979   BACK SURGERY  2009   ENDOVENOUS ABLATION SAPHENOUS VEIN W/ LASER Right 09/15/2013   right greater saphenous vein, by Gretta Began MD   ENDOVENOUS ABLATION SAPHENOUS VEIN W/ LASER Left 10/16/2013   left greater saphenous vein, by Gretta Began MD   left open rotator cuff surgery   2010    lumbar infusion  01/16/2008   PARTIAL HYSTERECTOMY  1979   SHOULDER OPEN ROTATOR CUFF REPAIR  07/05/2011   Procedure: ROTATOR CUFF REPAIR SHOULDER OPEN;  Surgeon: Jacki Cones, MD;  Location: [facility];  Service: Orthopedics;  Laterality: Right;   TOTAL KNEE ARTHROPLASTY Right 09/09/2014   Procedure: TOTAL RIGHT  KNEE ARTHROPLASTY;  Surgeon: Ranee Gosselin, MD;  Location: [facility];  Service: Orthopedics;  Laterality: Right;   Family History  Problem Relation Age of Onset   Heart disease Mother    Cancer Mother    Varicose Veins Mother    Deep vein thrombosis Mother    Diabetes Mother    Hypertension Mother    Depression Mother    Obesity Mother    Heart disease Father    AAA (abdominal aortic aneurysm) Father    Varicose Veins Sister    Cancer Sister    Diabetes Sister    Heart disease Sister 25   Hypertension Sister    Vision loss Sister    Diabetes Brother    Vision loss Brother    Peripheral vascular disease Brother    AAA (abdominal aortic aneurysm) Brother    Diabetes Sister    Heart disease Sister    Hypertension Sister    Vision  loss Sister    Diabetes Sister    Vision loss Sister    Social History   Socioeconomic History   Marital status: Widowed    Spouse name: Not on file   Number of children: Not on file   Years of education: Not on file   Highest education level: Not on file  Occupational History   Occupation: retired  Tobacco Use   Smoking status: Former    Packs/day: 0.25    Years: 13.00    Total pack years: 3.25    Types: Cigarettes    Quit date: 06/22/1964    Years since quitting: 57.4   Smokeless tobacco: Never  Substance and Sexual  Activity   Alcohol use: No   Drug use: No   Sexual activity: Never    Birth control/protection: Post-menopausal  Other Topics Concern   Not on file  Social History Narrative   Lives home alone.  Is a widow.  Education: HS grad.  2 children. Caffeine 2.5 cups daily.   Social Determinants of Health   Financial Resource Strain: Low Risk  (11/08/2021)   Overall Financial Resource Strain (CARDIA)    Difficulty of Paying Living Expenses: Not hard at all  Food Insecurity: No Food Insecurity (11/08/2021)   Hunger Vital Sign    Worried About Running Out of Food in the Last Year: Never true    Ran Out of Food in the Last Year: Never true  Transportation Needs: No Transportation Needs (11/08/2021)   PRAPARE - Administrator, Civil ServiceTransportation    Lack of Transportation (Medical): No    Lack of Transportation (Non-Medical): No  Physical Activity: Inactive (11/08/2021)   Exercise Vital Sign    Days of Exercise per Week: 0 days    Minutes of Exercise per Session: 0 min  Stress: No Stress Concern Present (11/08/2021)   [employer] - Occupational Stress Questionnaire    Feeling of Stress : Not at all  Social Connections: Moderately Integrated (11/08/2021)   Social Connection and Isolation Panel [NHANES]    Frequency of Communication with Friends and Family: More than three times a week    Frequency of Social Gatherings with Friends and Family: More than three  times a week    Attends Religious Services: More than 4 times per year    Active Member of [employer] or Organizations: Yes    Attends [employer]er[employer] or Organization Meetings: 1 to 4 times per year    Marital Status: Widowed    Tobacco Counseling Counseling given: Not Answered   Clinical Intake:  Pre-visit preparation completed: Yes  Pain : No/denies pain     BMI - recorded: 31.18 Nutritional Status: BMI > 30  Obese Nutritional Risks: None Diabetes: No  How often do you need to have someone help you when you read instructions, pamphlets, or other written materials from your doctor or pharmacy?: 1 - Never  Diabetic?no  Interpreter Needed?: No  Information entered by :: Lanier Ensignina Estelita Iten, LPN   Activities of Daily Living    11/08/2021   10:39 AM  In your present state of health, do you have any difficulty performing the following activities:  Hearing? 0  Vision? 0  Difficulty concentrating or making decisions? 0  Walking or climbing stairs? 1  Comment don't use stairs  Dressing or bathing? 0  Doing errands, shopping? 0  Preparing Food and eating ? N  Using the Toilet? N  In the past six months, have you accidently leaked urine? N  Do you have problems with loss of bowel control? N  Managing your Medications? N  Managing your Finances? N  Housekeeping or managing your Housekeeping? N    Patient Care Team: Willow OraAndy, Camille L, MD as PCP - General (Family Medicine) Sheran Luzamos, Richard, MD as Consulting Physician (Physical Medicine and Rehabilitation) Suzanna ObeyByers, John, MD as Consulting Physician (Otolaryngology)  Indicate any recent Medical Services you may have received from other than [facility] providers in the past year (date may be approximate).     Assessment:   This is a routine wellness examination for Amber [name].  Hearing/Vision screen Hearing Screening - Comments:: Pt denies any hearing issues  Vision Screening - Comments:: Pt follows up with Dr Cathey EndowBowen  for annual eye exams   Dietary  issues and exercise activities discussed: Current Exercise Habits: The patient does not participate in regular exercise at present   Goals Addressed             This Visit's Progress    Patient Stated       Get back to driving again and get of some medications       Depression Screen    11/08/2021   10:37 AM 06/22/2021    1:27 PM 04/16/2020   10:45 AM 03/17/2020   11:11 AM 10/17/2019    9:44 AM 04/18/2019    8:04 AM  PHQ 2/9 Scores  PHQ - 2 Score 0 0 0 2 0 0  PHQ- 9 Score    8      Fall Risk    11/08/2021   10:39 AM 09/21/2021    2:35 PM 10/14/2020   11:11 AM 08/26/2020   11:21 AM 04/16/2020   10:45 AM  Fall Risk   Falls in the past year? 0 0 0 0 0  Number falls in past yr: 0 0     Injury with Fall? 0 0     Risk for fall due to : Impaired mobility;Impaired balance/gait;Impaired vision      Follow up Falls prevention discussed Falls evaluation completed       FALL RISK PREVENTION PERTAINING TO THE HOME:  Any stairs in or around the home? Yes  If so, are there any without handrails? No  Home free of loose throw rugs in walkways, pet beds, electrical cords, etc? Yes  Adequate lighting in your home to reduce risk of falls? Yes   ASSISTIVE DEVICES UTILIZED TO PREVENT FALLS:  Life alert? Yes  Use of a cane, walker or w/c? Yes  Grab bars in the bathroom? Yes  Shower chair or bench in shower? Yes  Elevated toilet seat or a handicapped toilet? No   TIMED UP AND GO:  Was the test performed? No .   Cognitive Function:    05/15/2017    8:52 AM  MMSE - Mini Mental State Exam  Orientation to time 3  Orientation to Place 5  Registration 3  Attention/ Calculation 1  Recall 3  Language- name 2 objects 2  Language- repeat 1  Language- follow 3 step command 3  Language- read & follow direction 1  Write a sentence 1  Copy design 1  Total score 24        11/08/2021   10:41 AM 12/05/2019    2:17 PM  6CIT Screen  What Year? 0 points 0 points  What month? 0  points 0 points  What time? 0 points 0 points  Count back from 20 0 points 0 points  Months in reverse 0 points 2 points  Repeat phrase 0 points 0 points  Total Score 0 points 2 points    Immunizations Immunization History  Administered Date(s) Administered   Fluad Quad(high Dose 65+) 02/17/2019, 03/18/2020, 03/15/2021   Influenza, High Dose Seasonal PF 04/10/2016, 04/03/2017, 04/29/2018   Influenza,inj,Quad PF,6+ Mos 02/24/2015, 04/01/2018   Influenza,inj,quad, With Preservative 04/01/2018   Influenza-Unspecified 02/24/2015, 04/01/2018   PFIZER(Purple Top)SARS-COV-2 Vaccination 07/15/2019, 08/05/2019   PNEUMOCOCCAL CONJUGATE-20 06/22/2021   Pneumococcal Polysaccharide-23 11/06/2012      Flu Vaccine status: Up to date  Pneumococcal vaccine status: Up to date  Covid-19 vaccine status: Completed vaccines  Qualifies for Shingles Vaccine? Yes   Zostavax completed No   Shingrix Completed?: No.  Education has been provided regarding the importance of this vaccine. Patient has been advised to call insurance company to determine out of pocket expense if they have not yet received this vaccine. Advised may also receive vaccine at local pharmacy or Health Dept. Verbalized acceptance and understanding.  Screening Tests Health Maintenance  Topic Date Due   Zoster Vaccines- Shingrix (1 of 2) Never done   DEXA SCAN  12/13/2008   COVID-19 Vaccine (3 - Pfizer risk series) 09/02/2019   INFLUENZA VACCINE  12/27/2021   Pneumonia Vaccine 75+ Years old  Completed   HPV VACCINES  Aged Out   TETANUS/TDAP  Discontinued    Health Maintenance  Health Maintenance Due  Topic Date Due   Zoster Vaccines- Shingrix (1 of 2) Never done   DEXA SCAN  12/13/2008   COVID-19 Vaccine (3 - Pfizer risk series) 09/02/2019    Colorectal cancer screening: No longer required.   Mammogram status: No longer required due to age.  Bone Density status: Completed 12/14/06. Results reflect: Bone density  results: OSTEOPENIA. Repeat every scheduled per dr Mardelle Matte stated by patient  years.  Additional Screening:   Vision Screening: Recommended annual ophthalmology exams for early detection of glaucoma and other disorders of the eye. Is the patient up to date with their annual eye exam?  Yes  Who is the provider or what is the name of the office in which the patient attends annual eye exams? Dr Cathey Endow  If pt is not established with a provider, would they like to be referred to a provider to establish care? No .   Dental Screening: Recommended annual dental exams for proper oral hygiene  Community Resource Referral / Chronic Care Management: CRR required this visit?  No   CCM required this visit?  No      Plan:     I have personally reviewed and noted the following in the patient's chart:   Medical and social history Use of alcohol, tobacco or illicit drugs  Current medications and supplements including opioid prescriptions.  Functional ability and status Nutritional status Physical activity Advanced directives List of other physicians Hospitalizations, surgeries, and ER visits in previous 12 months Vitals Screenings to include cognitive, depression, and falls Referrals and appointments  In addition, I have reviewed and discussed with patient certain preventive protocols, quality metrics, and best practice recommendations. A written personalized care plan for preventive services as well as general preventive health recommendations were provided to patient.     Marzella Schlein, LPN   06/16/1476   Nurse Notes: pt wants clarity if she should have bone scan . Also pt has issues when waking up with a lot of mucous and film over her teeth that is bothersome . Pt is making an earlier appt to be seen

## 2021-11-18 ENCOUNTER — Encounter: Payer: Self-pay | Admitting: Family Medicine

## 2021-11-18 ENCOUNTER — Ambulatory Visit (INDEPENDENT_AMBULATORY_CARE_PROVIDER_SITE_OTHER): Payer: Medicare Other | Admitting: Family Medicine

## 2021-11-18 VITALS — BP 130/60 | HR 89 | Temp 98.7°F | Ht 63.0 in | Wt 181.2 lb

## 2021-11-18 DIAGNOSIS — B370 Candidal stomatitis: Secondary | ICD-10-CM

## 2021-11-18 MED ORDER — NYSTATIN 100000 UNIT/ML MT SUSP: 5.0000 mL | Freq: Four times a day (QID) | OROMUCOSAL | 0 refills | Status: DC

## 2021-12-07 ENCOUNTER — Other Ambulatory Visit: Payer: Self-pay | Admitting: Family Medicine

## 2021-12-07 ENCOUNTER — Ambulatory Visit: Payer: Medicare Other | Admitting: Family Medicine

## 2021-12-13 ENCOUNTER — Ambulatory Visit (INDEPENDENT_AMBULATORY_CARE_PROVIDER_SITE_OTHER): Payer: Medicare Other | Admitting: Family Medicine

## 2021-12-13 ENCOUNTER — Encounter: Payer: Self-pay | Admitting: Family Medicine

## 2021-12-13 VITALS — BP 134/76 | HR 88 | Temp 98.7°F | Ht 63.0 in | Wt 175.0 lb

## 2021-12-13 DIAGNOSIS — I10 Essential (primary) hypertension: Secondary | ICD-10-CM

## 2021-12-13 DIAGNOSIS — K219 Gastro-esophageal reflux disease without esophagitis: Secondary | ICD-10-CM | POA: Diagnosis not present

## 2021-12-13 DIAGNOSIS — G8929 Other chronic pain: Secondary | ICD-10-CM

## 2021-12-13 DIAGNOSIS — R7303 Prediabetes: Secondary | ICD-10-CM

## 2021-12-13 DIAGNOSIS — Z780 Asymptomatic menopausal state: Secondary | ICD-10-CM

## 2021-12-13 DIAGNOSIS — M545 Low back pain, unspecified: Secondary | ICD-10-CM

## 2021-12-13 NOTE — Patient Instructions (Addendum)
Please return in 3-4 months for your annual complete physical; please come fasting.   If you have any questions or concerns, please don't hesitate to send me a message via MyChart or call the office at [phone]. Thank you for visiting with [place] today! It's our pleasure caring for you.   I have ordered a mammogram and/or bone density for you as we discussed today: []   Mammogram  [x]   Bone Density  Please call the office checked below to schedule your appointment:  [x]   [facility]      [address] [place],        [address] Wing         []   [facility]  [address] [place],  [facility]

## 2021-12-13 NOTE — Progress Notes (Signed)
Subjective  CC:  Chief Complaint  Patient presents with   Hypertension    HPI: Amber [name] is a 81 y.o. female who presents to the office today to address the problems listed above in the chief complaint. Hypertension f/u: Control is good . Pt reports she is doing well. taking medications as instructed, no medication side effects noted, no TIAs, no chest pain on exertion, no dyspnea on exertion, no swelling of ankles. Has been very well controlled on hyzaar. Today she has just returned from a 4 day family reunion: thinks that could be contributing to elevated reading in office today. Home readings have been normal. She denies adverse effects from his BP medications. Compliance with medication is good.  Back pain; to dr. Ethelene Hal for another steroid injection; did get some mild relief. On oxy qid and gaba and flexeril.  LPR is stable again on ppi Prediabetes: no sxs of hyperglycemia. Last a1c 5.8 HM: due dexa and could consider mammogram.   Assessment  1. Essential hypertension   2. Prediabetes   3. Laryngopharyngeal reflux (LPR)   4. Asymptomatic menopausal state   5. Chronic midline low back pain without sciatica      Plan   Hypertension f/u: BP control is well controlled. Pt wil continue home monitoring.  Continue ppi for LPR Will recheck a1c in 3 months at [facility] Refer for dexa; discussed options regarding breast cancer screening.  Low back pain per dr Ethelene Hal. Can consider long acting oxy at this point. Education given. She will discuss with specialist.   Education regarding management of these chronic disease states was given. Management strategies discussed on successive visits include dietary and exercise recommendations, goals of achieving and maintaining IBW, and lifestyle modifications aiming for adequate sleep and minimizing stressors.   Follow up: Return in about 4 months (around 04/15/2022) for complete physical.  Orders Placed This Encounter  Procedures   DG Bone  Density   No orders of the defined types were placed in this encounter.     BP Readings from Last 3 Encounters:  12/13/21 134/76  11/18/21 130/60  09/21/21 138/62   Wt Readings from Last 3 Encounters:  12/13/21 175 lb (79.4 kg)  11/18/21 181 lb 3.2 oz (82.2 kg)  09/21/21 176 lb (79.8 kg)    Lab Results  Component Value Date   CHOL 136 03/15/2021   CHOL 133 03/17/2020   CHOL 109 02/17/2019   Lab Results  Component Value Date   HDL 65.20 03/15/2021   HDL 53 03/17/2020   HDL 44.90 02/17/2019   Lab Results  Component Value Date   LDLCALC 51 03/15/2021   LDLCALC 59 03/17/2020   LDLCALC 32 02/17/2019   Lab Results  Component Value Date   TRIG 100.0 03/15/2021   TRIG 119 03/17/2020   TRIG 157.0 (H) 02/17/2019   Lab Results  Component Value Date   CHOLHDL 2 03/15/2021   CHOLHDL 2 02/17/2019   No results found for: "LDLDIRECT" Lab Results  Component Value Date   CREATININE 0.76 03/15/2021   BUN 20 03/15/2021   NA 134 (L) 03/15/2021   K 3.6 03/15/2021   CL 94 (L) 03/15/2021   CO2 31 03/15/2021    The ASCVD Risk score (Arnett DK, et al., 2019) failed to calculate for the following reasons:   The 2019 ASCVD risk score is only valid for ages 28 to 79  I reviewed the patients updated PMH, FH, and SocHx.    Patient Active Problem List  Diagnosis Date Noted   Lumbosacral radiculopathy at S1 12/06/2020    Priority: High   Mixed hyperlipidemia 12/06/2020    Priority: High   Class 1 obesity due to excess calories in adult 03/29/2020    Priority: High   Prediabetes 03/18/2020    Priority: High   Essential hypertension 12/08/2015    Priority: High   Acquired hypothyroidism 12/08/2015    Priority: High   Peripheral vascular disease, unspecified ([place]) 04/18/2013    Priority: High   Osteopenia after menopause 12/06/2020    Priority: Medium    Laryngopharyngeal reflux (LPR) 03/11/2019    Priority: Medium    Gastroesophageal reflux disease 01/19/2016     Priority: Medium    Hoarseness, chronic 12/28/2015    Priority: Medium    Complete left bundle branch block 12/08/2015    Priority: Medium    Chronic venous insufficiency 04/18/2013    Priority: Medium    Hyponatremia 12/06/2020    Allergies: Codeine, Darvocet [propoxyphene n-acetaminophen], Vicodin [hydrocodone-acetaminophen], Nickel, and Sulfa antibiotics  Social History: Patient  reports that she quit smoking about 57 years ago. Her smoking use included cigarettes. She has a 3.25 pack-year smoking history. She has never used smokeless tobacco. She reports that she does not drink alcohol and does not use drugs.  Current Meds  Medication Sig   acetaminophen (TYLENOL) 500 MG tablet Take 500-1,000 mg by mouth See admin instructions. Take 500mg  in the AM and 1000mg  in the PM.   aspirin 81 MG chewable tablet Chew 81 mg by mouth at bedtime.    atorvastatin (LIPITOR) 20 MG tablet TAKE 1 TABLET AT BEDTIME   B Complex Vitamins (VITAMIN-B COMPLEX) TABS Take 1 tablet by mouth daily.    Cholecalciferol (VITAMIN D3) 1000 units CAPS Take 1,000 Units by mouth daily.    clobetasol ointment (TEMOVATE) 0.05 % clobetasol 0.05 % topical ointment   cyclobenzaprine (FLEXERIL) 10 MG tablet Take 1 tablet (10 mg total) by mouth 3 (three) times daily as needed for muscle spasms.   gabapentin (NEURONTIN) 100 MG capsule TAKE 1 CAPSULE(100 MG) BY MOUTH THREE TIMES DAILY   levothyroxine (SYNTHROID) 50 MCG tablet Take 1 tablet (50 mcg total) by mouth daily before breakfast.   losartan-hydrochlorothiazide (HYZAAR) 50-12.5 MG tablet Take 1 tablet by mouth daily.   Multiple Vitamins-Minerals (PRESERVISION AREDS PO) Take 1 tablet by mouth 2 (two) times daily.    nystatin (MYCOSTATIN) 100000 UNIT/ML suspension Take 5 mLs (500,000 Units total) by mouth 4 (four) times daily. Swish and spit   nystatin (MYCOSTATIN/NYSTOP) powder Nyamyc 100,000 unit/gram topical powder   oxyCODONE (OXY IR/ROXICODONE) 5 MG immediate release  tablet Take 1 tablet (5 mg total) by mouth 2 (two) times daily as needed for severe pain.   pantoprazole (PROTONIX) 40 MG tablet TAKE 1 TABLET(40 MG) BY MOUTH DAILY   polyvinyl alcohol (LIQUIFILM TEARS) 1.4 % ophthalmic solution Place 1 drop into both eyes daily as needed for dry eyes.   traMADol (ULTRAM) 50 MG tablet Take 1 tablet (50 mg total) by mouth every 8 (eight) hours as needed.   vitamin C (ASCORBIC ACID) 500 MG tablet Take 500 mg by mouth daily.    Review of Systems: Cardiovascular: negative for chest pain, palpitations, leg swelling, orthopnea Respiratory: negative for SOB, wheezing or persistent cough Gastrointestinal: negative for abdominal pain Genitourinary: negative for dysuria or gross hematuria  Objective  Vitals: BP 134/76 Comment: home readings  Pulse 88   Temp 98.7 F (37.1 C)   Ht 5\' 3"  (  1.6 m)   Wt 175 lb (79.4 kg)   LMP  (LMP Unknown)   SpO2 97%   BMI 31.00 kg/m  General: no acute distress appears comfortable, uses cane to ambulate Psych:  Alert and oriented, normal mood and affect HEENT:  Normocephalic, atraumatic, supple neck  Cardiovascular:  RRR without murmur. no edema Respiratory:  Good breath sounds bilaterally, CTAB with normal respiratory effort Skin:  Warm, no rashes Neurologic:   Mental status is normal Commons side effects, risks, benefits, and alternatives for medications and treatment plan prescribed today were discussed, and the patient expressed understanding of the given instructions. Patient is instructed to call or message via MyChart if he/she has any questions or concerns regarding our treatment plan. No barriers to understanding were identified. We discussed Red Flag symptoms and signs in detail. Patient expressed understanding regarding what to do in case of urgent or emergency type symptoms.  Medication list was reconciled, printed and provided to the patient in AVS. Patient instructions and summary information was reviewed with the  patient as documented in the AVS. This note was prepared with assistance of Dragon voice recognition software. Occasional wrong-word or sound-a-like substitutions may have occurred due to the inherent limitations of voice recognition software  This visit occurred during the SARS-CoV-2 public health emergency.  Safety protocols were in place, including screening questions prior to the visit, additional usage of staff PPE, and extensive cleaning of exam room while observing appropriate contact time as indicated for disinfecting solutions.

## 2021-12-16 ENCOUNTER — Other Ambulatory Visit: Payer: Self-pay | Admitting: Family Medicine

## 2021-12-16 DIAGNOSIS — Z780 Asymptomatic menopausal state: Secondary | ICD-10-CM

## 2021-12-16 DIAGNOSIS — Z1231 Encounter for screening mammogram for malignant neoplasm of breast: Secondary | ICD-10-CM

## 2021-12-19 ENCOUNTER — Ambulatory Visit: Payer: Medicare Other | Admitting: Family Medicine

## 2022-01-04 ENCOUNTER — Encounter (INDEPENDENT_AMBULATORY_CARE_PROVIDER_SITE_OTHER): Payer: Self-pay

## 2022-02-20 ENCOUNTER — Encounter: Payer: Self-pay | Admitting: *Deleted

## 2022-03-15 ENCOUNTER — Other Ambulatory Visit: Payer: Self-pay | Admitting: Family Medicine

## 2022-03-15 DIAGNOSIS — I10 Essential (primary) hypertension: Secondary | ICD-10-CM

## 2022-03-29 ENCOUNTER — Telehealth: Payer: Self-pay | Admitting: Family Medicine

## 2022-03-29 NOTE — Telephone Encounter (Signed)
-  Patient requests to be called (does not use MyChart) at ph# [phone] to be advised if Dr. Jonni Sanger does Bursitis shots in both groin areas.  Patient states if Dr. Jonni Sanger does not, Patient will go to Ortho walk-in.

## 2022-03-30 NOTE — Telephone Encounter (Signed)
Spoke with ppt and she stated that she can not remember where she got the injection last time but as far as the dr that gave it to her was Dr. Ronnald Ramp so I advised her to contact there office to see where they gave her the injection at at let us know.

## 2022-03-31 NOTE — Telephone Encounter (Signed)
Patient called to get scheduled with PCP for bursitis injections. I re-informed patient of Patricia's advise as listed below. Pt verbalized understanding and stated she will call Dr. Ronnald Ramp office to get specifics on previous injection including the site.

## 2022-04-08 ENCOUNTER — Emergency Department (HOSPITAL_COMMUNITY): Payer: Medicare Other

## 2022-04-08 ENCOUNTER — Inpatient Hospital Stay (HOSPITAL_COMMUNITY): Payer: Medicare Other

## 2022-04-08 ENCOUNTER — Other Ambulatory Visit: Payer: Self-pay

## 2022-04-08 ENCOUNTER — Encounter (HOSPITAL_COMMUNITY): Payer: Self-pay

## 2022-04-08 ENCOUNTER — Inpatient Hospital Stay (HOSPITAL_COMMUNITY)
Admission: EM | Admit: 2022-04-08 | Discharge: 2022-04-16 | DRG: 871 | Disposition: A | Discharge status: Skilled Nursing Facility | Payer: Medicare Other | Attending: Internal Medicine | Admitting: Internal Medicine

## 2022-04-08 DIAGNOSIS — I700 Atherosclerosis of aorta: Secondary | ICD-10-CM | POA: Diagnosis present

## 2022-04-08 DIAGNOSIS — R319 Hematuria, unspecified: Secondary | ICD-10-CM | POA: Diagnosis not present

## 2022-04-08 DIAGNOSIS — I2510 Atherosclerotic heart disease of native coronary artery without angina pectoris: Secondary | ICD-10-CM | POA: Diagnosis not present

## 2022-04-08 DIAGNOSIS — Z6836 Body mass index (BMI) 36.0-36.9, adult: Secondary | ICD-10-CM

## 2022-04-08 DIAGNOSIS — E669 Obesity, unspecified: Secondary | ICD-10-CM | POA: Diagnosis present

## 2022-04-08 DIAGNOSIS — E8809 Other disorders of plasma-protein metabolism, not elsewhere classified: Secondary | ICD-10-CM | POA: Diagnosis not present

## 2022-04-08 DIAGNOSIS — I2699 Other pulmonary embolism without acute cor pulmonale: Secondary | ICD-10-CM | POA: Diagnosis not present

## 2022-04-08 DIAGNOSIS — E876 Hypokalemia: Secondary | ICD-10-CM | POA: Diagnosis present

## 2022-04-08 DIAGNOSIS — N83201 Unspecified ovarian cyst, right side: Secondary | ICD-10-CM | POA: Diagnosis present

## 2022-04-08 DIAGNOSIS — I081 Rheumatic disorders of both mitral and tricuspid valves: Secondary | ICD-10-CM | POA: Diagnosis present

## 2022-04-08 DIAGNOSIS — I9589 Other hypotension: Secondary | ICD-10-CM

## 2022-04-08 DIAGNOSIS — N179 Acute kidney failure, unspecified: Secondary | ICD-10-CM | POA: Diagnosis present

## 2022-04-08 DIAGNOSIS — N1832 Chronic kidney disease, stage 3b: Secondary | ICD-10-CM | POA: Diagnosis present

## 2022-04-08 DIAGNOSIS — E785 Hyperlipidemia, unspecified: Secondary | ICD-10-CM | POA: Diagnosis present

## 2022-04-08 DIAGNOSIS — Z833 Family history of diabetes mellitus: Secondary | ICD-10-CM

## 2022-04-08 DIAGNOSIS — I739 Peripheral vascular disease, unspecified: Secondary | ICD-10-CM | POA: Insufficient documentation

## 2022-04-08 DIAGNOSIS — B9620 Unspecified Escherichia coli [E. coli] as the cause of diseases classified elsewhere: Secondary | ICD-10-CM | POA: Diagnosis present

## 2022-04-08 DIAGNOSIS — D649 Anemia, unspecified: Secondary | ICD-10-CM | POA: Diagnosis present

## 2022-04-08 DIAGNOSIS — N3001 Acute cystitis with hematuria: Secondary | ICD-10-CM | POA: Diagnosis not present

## 2022-04-08 DIAGNOSIS — I214 Non-ST elevation (NSTEMI) myocardial infarction: Secondary | ICD-10-CM

## 2022-04-08 DIAGNOSIS — D72829 Elevated white blood cell count, unspecified: Secondary | ICD-10-CM

## 2022-04-08 DIAGNOSIS — A419 Sepsis, unspecified organism: Secondary | ICD-10-CM | POA: Diagnosis present

## 2022-04-08 DIAGNOSIS — M1612 Unilateral primary osteoarthritis, left hip: Secondary | ICD-10-CM | POA: Diagnosis present

## 2022-04-08 DIAGNOSIS — A4151 Sepsis due to Escherichia coli [E. coli]: Principal | ICD-10-CM | POA: Diagnosis present

## 2022-04-08 DIAGNOSIS — I959 Hypotension, unspecified: Secondary | ICD-10-CM | POA: Diagnosis present

## 2022-04-08 DIAGNOSIS — R6521 Severe sepsis with septic shock: Secondary | ICD-10-CM | POA: Diagnosis present

## 2022-04-08 DIAGNOSIS — J90 Pleural effusion, not elsewhere classified: Secondary | ICD-10-CM | POA: Diagnosis present

## 2022-04-08 DIAGNOSIS — R072 Precordial pain: Secondary | ICD-10-CM | POA: Diagnosis not present

## 2022-04-08 DIAGNOSIS — Z86718 Personal history of other venous thrombosis and embolism: Secondary | ICD-10-CM

## 2022-04-08 DIAGNOSIS — R6520 Severe sepsis without septic shock: Secondary | ICD-10-CM | POA: Diagnosis not present

## 2022-04-08 DIAGNOSIS — Z885 Allergy status to narcotic agent status: Secondary | ICD-10-CM

## 2022-04-08 DIAGNOSIS — N390 Urinary tract infection, site not specified: Secondary | ICD-10-CM

## 2022-04-08 DIAGNOSIS — E871 Hypo-osmolality and hyponatremia: Secondary | ICD-10-CM | POA: Diagnosis present

## 2022-04-08 DIAGNOSIS — D696 Thrombocytopenia, unspecified: Secondary | ICD-10-CM | POA: Diagnosis present

## 2022-04-08 DIAGNOSIS — I25110 Atherosclerotic heart disease of native coronary artery with unstable angina pectoris: Secondary | ICD-10-CM | POA: Diagnosis present

## 2022-04-08 DIAGNOSIS — I5022 Chronic systolic (congestive) heart failure: Secondary | ICD-10-CM | POA: Diagnosis present

## 2022-04-08 DIAGNOSIS — N12 Tubulo-interstitial nephritis, not specified as acute or chronic: Secondary | ICD-10-CM | POA: Diagnosis not present

## 2022-04-08 DIAGNOSIS — G9341 Metabolic encephalopathy: Secondary | ICD-10-CM | POA: Diagnosis present

## 2022-04-08 DIAGNOSIS — M5416 Radiculopathy, lumbar region: Secondary | ICD-10-CM | POA: Insufficient documentation

## 2022-04-08 DIAGNOSIS — Z79899 Other long term (current) drug therapy: Secondary | ICD-10-CM | POA: Diagnosis not present

## 2022-04-08 DIAGNOSIS — I447 Left bundle-branch block, unspecified: Secondary | ICD-10-CM | POA: Diagnosis not present

## 2022-04-08 DIAGNOSIS — R7989 Other specified abnormal findings of blood chemistry: Secondary | ICD-10-CM

## 2022-04-08 DIAGNOSIS — Z821 Family history of blindness and visual loss: Secondary | ICD-10-CM

## 2022-04-08 DIAGNOSIS — E039 Hypothyroidism, unspecified: Secondary | ICD-10-CM | POA: Diagnosis present

## 2022-04-08 DIAGNOSIS — R7881 Bacteremia: Secondary | ICD-10-CM | POA: Diagnosis not present

## 2022-04-08 DIAGNOSIS — D631 Anemia in chronic kidney disease: Secondary | ICD-10-CM | POA: Diagnosis present

## 2022-04-08 DIAGNOSIS — L03211 Cellulitis of face: Secondary | ICD-10-CM | POA: Diagnosis present

## 2022-04-08 DIAGNOSIS — R079 Chest pain, unspecified: Secondary | ICD-10-CM

## 2022-04-08 DIAGNOSIS — Z96651 Presence of right artificial knee joint: Secondary | ICD-10-CM | POA: Diagnosis present

## 2022-04-08 DIAGNOSIS — Z818 Family history of other mental and behavioral disorders: Secondary | ICD-10-CM

## 2022-04-08 DIAGNOSIS — I130 Hypertensive heart and chronic kidney disease with heart failure and stage 1 through stage 4 chronic kidney disease, or unspecified chronic kidney disease: Secondary | ICD-10-CM | POA: Diagnosis present

## 2022-04-08 DIAGNOSIS — K5900 Constipation, unspecified: Secondary | ICD-10-CM | POA: Insufficient documentation

## 2022-04-08 DIAGNOSIS — N10 Acute tubulo-interstitial nephritis: Secondary | ICD-10-CM | POA: Diagnosis present

## 2022-04-08 DIAGNOSIS — Z90711 Acquired absence of uterus with remaining cervical stump: Secondary | ICD-10-CM

## 2022-04-08 DIAGNOSIS — Z79890 Hormone replacement therapy (postmenopausal): Secondary | ICD-10-CM

## 2022-04-08 DIAGNOSIS — M25559 Pain in unspecified hip: Secondary | ICD-10-CM | POA: Insufficient documentation

## 2022-04-08 DIAGNOSIS — G8929 Other chronic pain: Secondary | ICD-10-CM | POA: Insufficient documentation

## 2022-04-08 DIAGNOSIS — Z882 Allergy status to sulfonamides status: Secondary | ICD-10-CM

## 2022-04-08 DIAGNOSIS — Z87891 Personal history of nicotine dependence: Secondary | ICD-10-CM

## 2022-04-08 DIAGNOSIS — Z8249 Family history of ischemic heart disease and other diseases of the circulatory system: Secondary | ICD-10-CM

## 2022-04-08 DIAGNOSIS — M25552 Pain in left hip: Secondary | ICD-10-CM | POA: Diagnosis not present

## 2022-04-08 DIAGNOSIS — Z981 Arthrodesis status: Secondary | ICD-10-CM

## 2022-04-08 LAB — ECHOCARDIOGRAM LIMITED
AR max vel: 1.61 cm2
AV Area VTI: 1.61 cm2
AV Area mean vel: 1.71 cm2
AV Mean grad: 4 mmHg
AV Peak grad: 8.1 mmHg
Ao pk vel: 1.42 m/s
Area-P 1/2: 4.06 cm2
Height: 62 in
MV VTI: 1.42 cm2
S' Lateral: 3.1 cm
Weight: 2800 oz

## 2022-04-08 LAB — CBC
HCT: 32.1 % — ABNORMAL LOW (ref 36.0–46.0)
Hemoglobin: 10.6 g/dL — ABNORMAL LOW (ref 12.0–15.0)
MCH: 31.7 pg (ref 26.0–34.0)
MCHC: 33 g/dL (ref 30.0–36.0)
MCV: 96.1 fL (ref 80.0–100.0)
Platelets: 128 10*3/uL — ABNORMAL LOW (ref 150–400)
RBC: 3.34 MIL/uL — ABNORMAL LOW (ref 3.87–5.11)
RDW: 14.9 % (ref 11.5–15.5)
WBC: 36.7 10*3/uL — ABNORMAL HIGH (ref 4.0–10.5)
nRBC: 0 % (ref 0.0–0.2)

## 2022-04-08 LAB — BLOOD CULTURE ID PANEL (REFLEXED) - BCID2

## 2022-04-08 LAB — COMPREHENSIVE METABOLIC PANEL
ALT: 28 U/L (ref 0–44)
AST: 36 U/L (ref 15–41)
Albumin: 3.3 g/dL — ABNORMAL LOW (ref 3.5–5.0)
Alkaline Phosphatase: 58 U/L (ref 38–126)
Anion gap: 10 (ref 5–15)
BUN: 28 mg/dL — ABNORMAL HIGH (ref 8–23)
CO2: 24 mmol/L (ref 22–32)
Calcium: 9 mg/dL (ref 8.9–10.3)
Chloride: 102 mmol/L (ref 98–111)
Creatinine, Ser: 1.63 mg/dL — ABNORMAL HIGH (ref 0.44–1.00)
GFR, Estimated: 31 mL/min — ABNORMAL LOW (ref >60)
Glucose, Bld: 165 mg/dL — ABNORMAL HIGH (ref 70–99)
Potassium: 3.3 mmol/L — ABNORMAL LOW (ref 3.5–5.1)
Sodium: 136 mmol/L (ref 135–145)
Total Bilirubin: 0.9 mg/dL (ref 0.3–1.2)
Total Protein: 5.6 g/dL — ABNORMAL LOW (ref 6.5–8.1)

## 2022-04-08 LAB — URINALYSIS, ROUTINE W REFLEX MICROSCOPIC
Bilirubin Urine: NEGATIVE
Glucose, UA: NEGATIVE mg/dL
Ketones, ur: NEGATIVE mg/dL
Nitrite: POSITIVE — AB
Protein, ur: 100 mg/dL — AB
Specific Gravity, Urine: 1.016 (ref 1.005–1.030)
WBC, UA: >50 WBC/hpf — ABNORMAL HIGH (ref 0–5)
pH: 5 (ref 5.0–8.0)

## 2022-04-08 LAB — CBC WITH DIFFERENTIAL/PLATELET
Abs Immature Granulocytes: 1.41 10*3/uL — ABNORMAL HIGH (ref 0.00–0.07)
Basophils Absolute: 0.1 10*3/uL (ref 0.0–0.1)
Basophils Relative: 0 %
Eosinophils Absolute: 0.1 10*3/uL (ref 0.0–0.5)
Eosinophils Relative: 0 %
HCT: 33.2 % — ABNORMAL LOW (ref 36.0–46.0)
Hemoglobin: 11.4 g/dL — ABNORMAL LOW (ref 12.0–15.0)
Immature Granulocytes: 4 %
Lymphocytes Relative: 4 %
Lymphs Abs: 1.2 10*3/uL (ref 0.7–4.0)
MCH: 32.1 pg (ref 26.0–34.0)
MCHC: 34.3 g/dL (ref 30.0–36.0)
MCV: 93.5 fL (ref 80.0–100.0)
Monocytes Absolute: 2.5 10*3/uL — ABNORMAL HIGH (ref 0.1–1.0)
Monocytes Relative: 7 %
Neutro Abs: 28.4 10*3/uL — ABNORMAL HIGH (ref 1.7–7.7)
Neutrophils Relative %: 85 %
Platelets: 146 10*3/uL — ABNORMAL LOW (ref 150–400)
RBC: 3.55 MIL/uL — ABNORMAL LOW (ref 3.87–5.11)
RDW: 14.2 % (ref 11.5–15.5)
WBC: 33.7 10*3/uL — ABNORMAL HIGH (ref 4.0–10.5)
nRBC: 0 % (ref 0.0–0.2)

## 2022-04-08 LAB — CREATININE, SERUM
Creatinine, Ser: 1.71 mg/dL — ABNORMAL HIGH (ref 0.44–1.00)
GFR, Estimated: 29 mL/min — ABNORMAL LOW (ref >60)

## 2022-04-08 LAB — TROPONIN I (HIGH SENSITIVITY)
Troponin I (High Sensitivity): 34 ng/L — ABNORMAL HIGH (ref <18)
Troponin I (High Sensitivity): 27 ng/L — ABNORMAL HIGH (ref <18)

## 2022-04-08 LAB — HEMOGLOBIN AND HEMATOCRIT, BLOOD
HCT: 33.7 % — ABNORMAL LOW (ref 36.0–46.0)
Hemoglobin: 11.4 g/dL — ABNORMAL LOW (ref 12.0–15.0)

## 2022-04-08 LAB — LACTIC ACID, PLASMA
Lactic Acid, Venous: 3.3 mmol/L (ref 0.5–1.9)
Lactic Acid, Venous: 2.6 mmol/L (ref 0.5–1.9)
Lactic Acid, Venous: 2.6 mmol/L (ref 0.5–1.9)
Lactic Acid, Venous: 2.8 mmol/L (ref 0.5–1.9)

## 2022-04-08 LAB — MRSA NEXT GEN BY PCR, NASAL: MRSA by PCR Next Gen: NOT DETECTED

## 2022-04-08 LAB — TSH: TSH: 0.957 u[IU]/mL (ref 0.350–4.500)

## 2022-04-08 LAB — MAGNESIUM: Magnesium: 1.9 mg/dL (ref 1.7–2.4)

## 2022-04-08 LAB — D-DIMER, QUANTITATIVE: D-Dimer, Quant: 3.86 ug/mL-FEU — ABNORMAL HIGH (ref 0.00–0.50)

## 2022-04-08 MED ORDER — SODIUM CHLORIDE 0.9 % IV BOLUS
1000.0000 mL | Freq: Once | INTRAVENOUS | Status: AC
  Administered 2022-04-08: 1000 mL via INTRAVENOUS

## 2022-04-08 MED ORDER — SODIUM CHLORIDE 0.9 % IV BOLUS
500.0000 mL | Freq: Once | INTRAVENOUS | Status: AC
  Administered 2022-04-08: 500 mL via INTRAVENOUS

## 2022-04-08 MED ORDER — NYSTATIN 100000 UNIT/GM EX POWD: 1.0000 | Freq: Every day | CUTANEOUS | Status: DC | PRN

## 2022-04-08 MED ORDER — CLOBETASOL PROPIONATE 0.05 % EX OINT: 1.0000 | TOPICAL_OINTMENT | Freq: Every day | CUTANEOUS | Status: DC | PRN

## 2022-04-08 MED ORDER — SODIUM CHLORIDE 0.9% FLUSH
3.0000 mL | Freq: Two times a day (BID) | INTRAVENOUS | Status: DC
  Administered 2022-04-09 – 2022-04-11 (×4): 3 mL via INTRAVENOUS

## 2022-04-08 MED ORDER — OXYCODONE HCL 5 MG PO TABS: 5.0000 mg | ORAL_TABLET | Freq: Two times a day (BID) | ORAL | Status: DC | PRN

## 2022-04-08 MED ORDER — LACTATED RINGERS IV BOLUS
1000.0000 mL | Freq: Once | INTRAVENOUS | Status: AC
  Administered 2022-04-08: 1000 mL via INTRAVENOUS

## 2022-04-08 MED ORDER — NOREPINEPHRINE 4 MG/250ML-% IV SOLN
2.0000 ug/min | INTRAVENOUS | Status: DC
  Administered 2022-04-08: 2 ug/min via INTRAVENOUS
  Filled 2022-04-08: qty 250

## 2022-04-08 MED ORDER — OXYCODONE-ACETAMINOPHEN 5-325 MG PO TABS
2.0000 | ORAL_TABLET | Freq: Once | ORAL | Status: AC
  Administered 2022-04-08: 2 via ORAL
  Filled 2022-04-08: qty 2

## 2022-04-08 MED ORDER — SODIUM CHLORIDE 0.9 % IV SOLN: INTRAVENOUS | Status: DC

## 2022-04-08 MED ORDER — SODIUM CHLORIDE 0.9 % IV SOLN
2.0000 g | Freq: Once | INTRAVENOUS | Status: AC
  Administered 2022-04-08: 2 g via INTRAVENOUS
  Filled 2022-04-08: qty 20

## 2022-04-08 MED ORDER — ALBUTEROL SULFATE (2.5 MG/3ML) 0.083% IN NEBU
2.5000 mg | INHALATION_SOLUTION | Freq: Four times a day (QID) | RESPIRATORY_TRACT | Status: DC | PRN
  Administered 2022-04-10: 2.5 mg via RESPIRATORY_TRACT
  Filled 2022-04-08: qty 3

## 2022-04-08 MED ORDER — SODIUM CHLORIDE 0.9 % IV SOLN
2.0000 g | INTRAVENOUS | Status: DC
  Administered 2022-04-09 – 2022-04-10 (×2): 2 g via INTRAVENOUS
  Filled 2022-04-08 (×2): qty 20

## 2022-04-08 MED ORDER — SODIUM CHLORIDE 0.9 % IV SOLN
250.0000 mL | INTRAVENOUS | Status: DC
  Administered 2022-04-08 – 2022-04-14 (×2): 250 mL via INTRAVENOUS

## 2022-04-08 MED ORDER — ACETAMINOPHEN 325 MG PO TABS: 650.0000 mg | ORAL_TABLET | Freq: Once | ORAL | Status: AC

## 2022-04-08 MED ORDER — GABAPENTIN 100 MG PO CAPS: 100.0000 mg | ORAL_CAPSULE | Freq: Three times a day (TID) | ORAL | Status: DC

## 2022-04-08 MED ORDER — POLYETHYLENE GLYCOL 3350 17 G PO PACK: 17.0000 g | PACK | Freq: Every day | ORAL | Status: DC | PRN

## 2022-04-08 MED ORDER — PANTOPRAZOLE SODIUM 40 MG PO TBEC
40.0000 mg | DELAYED_RELEASE_TABLET | Freq: Every day | ORAL | Status: DC
  Administered 2022-04-09 – 2022-04-16 (×8): 40 mg via ORAL
  Filled 2022-04-08 (×8): qty 1

## 2022-04-08 MED ORDER — AMOXICILLIN-POT CLAVULANATE 875-125 MG PO TABS
1.0000 | ORAL_TABLET | Freq: Once | ORAL | Status: AC
  Administered 2022-04-08: 1 via ORAL
  Filled 2022-04-08: qty 1

## 2022-04-08 MED ORDER — DOCUSATE SODIUM 100 MG PO CAPS
100.0000 mg | ORAL_CAPSULE | Freq: Two times a day (BID) | ORAL | Status: DC | PRN
  Administered 2022-04-13: 100 mg via ORAL
  Filled 2022-04-08: qty 1

## 2022-04-08 MED ORDER — MIDODRINE HCL 5 MG PO TABS
5.0000 mg | ORAL_TABLET | Freq: Three times a day (TID) | ORAL | Status: DC
  Administered 2022-04-09 (×2): 5 mg via ORAL
  Filled 2022-04-08 (×3): qty 1

## 2022-04-08 MED ORDER — OXYCODONE HCL 5 MG PO TABS
5.0000 mg | ORAL_TABLET | Freq: Four times a day (QID) | ORAL | Status: DC
  Administered 2022-04-08 – 2022-04-13 (×16): 5 mg via ORAL
  Filled 2022-04-08 (×18): qty 1

## 2022-04-08 MED ORDER — ONDANSETRON HCL 4 MG/2ML IJ SOLN
4.0000 mg | Freq: Four times a day (QID) | INTRAMUSCULAR | Status: DC | PRN
  Administered 2022-04-09: 4 mg via INTRAVENOUS
  Filled 2022-04-08: qty 2

## 2022-04-08 MED ORDER — POTASSIUM CHLORIDE 10 MEQ/100ML IV SOLN
10.0000 meq | INTRAVENOUS | Status: AC
  Administered 2022-04-08 (×2): 10 meq via INTRAVENOUS
  Filled 2022-04-08 (×2): qty 100

## 2022-04-08 MED ORDER — ENOXAPARIN SODIUM 30 MG/0.3ML IJ SOSY: 30.0000 mg | PREFILLED_SYRINGE | INTRAMUSCULAR | Status: DC

## 2022-04-08 MED ORDER — ENOXAPARIN SODIUM 40 MG/0.4ML IJ SOSY
40.0000 mg | PREFILLED_SYRINGE | INTRAMUSCULAR | Status: DC
  Administered 2022-04-08: 40 mg via SUBCUTANEOUS
  Filled 2022-04-08: qty 0.4

## 2022-04-08 MED ORDER — CHLORHEXIDINE GLUCONATE CLOTH 2 % EX PADS
6.0000 | MEDICATED_PAD | Freq: Every day | CUTANEOUS | Status: DC
  Administered 2022-04-09 – 2022-04-10 (×2): 6 via TOPICAL

## 2022-04-08 MED ORDER — ACETAMINOPHEN 325 MG PO TABS
650.0000 mg | ORAL_TABLET | Freq: Four times a day (QID) | ORAL | Status: DC | PRN
  Administered 2022-04-08 – 2022-04-09 (×2): 650 mg via ORAL
  Filled 2022-04-08 (×2): qty 2

## 2022-04-08 MED ORDER — ENOXAPARIN SODIUM 40 MG/0.4ML IJ SOSY: 40.0000 mg | PREFILLED_SYRINGE | INTRAMUSCULAR | Status: DC

## 2022-04-08 MED ORDER — ACETAMINOPHEN 325 MG PO TABS
ORAL_TABLET | ORAL | Status: AC
  Administered 2022-04-08: 650 mg via ORAL
  Filled 2022-04-08: qty 2

## 2022-04-08 MED ORDER — POLYVINYL ALCOHOL 1.4 % OP SOLN: 1.0000 [drp] | Freq: Every day | OPHTHALMIC | Status: DC | PRN

## 2022-04-08 MED ORDER — ATORVASTATIN CALCIUM 10 MG PO TABS
20.0000 mg | ORAL_TABLET | Freq: Every day | ORAL | Status: DC
  Administered 2022-04-09 – 2022-04-16 (×8): 20 mg via ORAL
  Filled 2022-04-08 (×8): qty 2

## 2022-04-08 MED ORDER — LEVOTHYROXINE SODIUM 50 MCG PO TABS
50.0000 ug | ORAL_TABLET | Freq: Every day | ORAL | Status: DC
  Administered 2022-04-09 – 2022-04-16 (×8): 50 ug via ORAL
  Filled 2022-04-08: qty 2
  Filled 2022-04-08 (×8): qty 1

## 2022-04-08 MED ORDER — ACETAMINOPHEN 650 MG RE SUPP: 650.0000 mg | Freq: Four times a day (QID) | RECTAL | Status: DC | PRN

## 2022-04-08 MED ORDER — OXYCODONE HCL 5 MG PO TABS: 5.0000 mg | ORAL_TABLET | Freq: Two times a day (BID) | ORAL | Status: DC

## 2022-04-08 MED ORDER — GABAPENTIN 100 MG PO CAPS
100.0000 mg | ORAL_CAPSULE | Freq: Three times a day (TID) | ORAL | Status: DC
  Administered 2022-04-08 – 2022-04-16 (×22): 100 mg via ORAL
  Filled 2022-04-08 (×22): qty 1

## 2022-04-08 MED ORDER — ACETAMINOPHEN 325 MG PO TABS: 325.0000 mg | ORAL_TABLET | Freq: Once | ORAL | Status: DC

## 2022-04-08 MED ORDER — ONDANSETRON HCL 4 MG PO TABS: 4.0000 mg | ORAL_TABLET | Freq: Four times a day (QID) | ORAL | Status: DC | PRN

## 2022-04-08 NOTE — Progress Notes (Addendum)
CT scan of the abdomen and pelvis noted concern for inflammation and fluid around the left kidney concerning for pyelonephritis with and 3.8 cm cyst of the right ovary which recommendation includes repeat ultrasound in 6 to 12 months with small bilateral pleural effusions.  No obstructive signs appreciated.  D-dimer has been noted to be significantly elevated at 3.86 with hemoglobin stable at 11.4.  Will check Doppler ultrasound of the lower extremities given prior history of DVT, but may warrant CTA of the chest for further evaluation.  O2 saturations currently maintained.  Hold any sedating medications.  Blood pressures initially appeared to be stabilizing, but MAP currently not maintaining greater than 65.  Ordered patient's fourth liter of IV fluids at this time.  Will consult PCCM to evaluate for possible need of closer  monitoring and/or pressors.

## 2022-04-08 NOTE — Progress Notes (Signed)
VASCULAR LAB    Bilateral lower extremity venous duplex has been performed.  See CV proc for preliminary results.   Keiera Strathman, RVT 04/08/2022, 12:02 PM

## 2022-04-08 NOTE — ED Notes (Signed)
Critical Lactic Acid Value of 3.3 reported to Dr. Clayborne Dana

## 2022-04-08 NOTE — Progress Notes (Signed)
PHARMACY - PHYSICIAN COMMUNICATION CRITICAL VALUE ALERT - BLOOD CULTURE IDENTIFICATION (BCID)  Amber [name] is an 81 y.o. female who presented to [facility] on 04/08/2022 with a chief complaint of sepsis due to pyelonephritis and possible facial cellulitis.  Assessment:  2 of 4 blood cultures (1 set anaerobic and aerobic) growing Ecoli with no resistance. MRSA negative. Ucx pending.   Name of physician (or Provider) Contacted: Dr. Everardo All  Current antibiotics: Vancomycin and ceftriaxone   Changes to prescribed antibiotics recommended:  Patient is on recommended antibiotics - No changes needed  Alphia Moh, PharmD, BCPS, [facility] Clinical Pharmacist  Please check AMION for all [facility] Pharmacy phone numbers After 10:00 PM, call Main Pharmacy [phone]

## 2022-04-08 NOTE — ED Notes (Signed)
Unable to get second set of blood cultures after multiple attempts. Antibiotics started, provider aware

## 2022-04-08 NOTE — H&P (Addendum)
History and Physical    Patient: Amber [name] [id number] DOB: 06/11/1940 DOA: 04/08/2022 DOS: the patient was seen and examined on 04/08/2022 PCP: Leamon Arnt, MD  Patient coming from: Home (lives alone) via EMS  Chief Complaint:  Chief Complaint  Patient presents with   Weakness   HPI: GISSELA [name] is a 81 y.o. female with medical history significant of hypertension, PVD, DVT not on anticoagulation, family history of blood clots, and hyporthyroidism presents with complaints of weakness.  History is obtained from the patient's daughter-in-law who is present at bedside as the patient is currently lethargic and unable to provide.  Apparently, family had come to check on the patient as earlier in the day she had seem to be slurring her speech over the phone.  The day before patient had noted complaints of feeling hot/cold.  She had also noted complaints of urinary frequency and pain with urination.  Patient also seemed to be confused and normal self.  Her daughter-in-law notes that while they were trying to get her up to go use the bathroom last night she had difficulty standing and they witnessed her eyes rolling back in her head and they were not able to get her to respond for which they called EMS.  She had not take her regular blood pressure medication yesterday.  The patient had been complaining of back pain and has had a prior fusion, but also complained of left leg pain.  She had just recently had a hip injection with [facility] on 11/9, but reported that it had not been helping.  Patient was not noted to have complaints of chest pain or report any recent bleeding.  She has prior history of DVT surrounding giving birth, but is not on any anticoagulation.  In route with EMS patient blood pressure was initially 70/50 and increased to 108/42 after receiving 400 mL of normal saline IV.  In the ED patient was noted to be febrile up to 100.3 F with  tachycardia and tachypnea and blood pressures relatively maintained.  Labs significant for WBC 33.7, hemoglobin 11.4, platelets 146, potassium 3.3, BUN 28, creatinine 1.63, high-sensitivity troponin 34 -> 27, and lactic acid 3.3-> 2.6.  CT scan of the head and chest x-ray were unremarkable.  UA positive for moderate hemoglobin, large leukocytes, positive nitrites, many bacteria, and greater than 50 RBCs.  First set of blood cultures have been obtained.  Patient received 2 L of lactated Ringer's, acetaminophen 650 mg, oxycodone 5 mg, Rocephin 2 g IV, and Augmentin.  Review of Systems: unable to review all systems due to the inability of the patient to answer questions. 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   Rotator cuff rupture, complete 07/05/2011   Varicose veins    Past Surgical History:  Procedure Laterality Date   APPENDECTOMY  1979   BACK SURGERY  2009   ENDOVENOUS ABLATION SAPHENOUS VEIN W/ LASER Right 09/15/2013   right greater saphenous vein, by Curt Jews MD   ENDOVENOUS ABLATION SAPHENOUS VEIN W/ LASER Left 10/16/2013   left greater saphenous vein, by Curt Jews MD   left open rotator cuff surgery  2010    lumbar infusion  01/16/2008   PARTIAL HYSTERECTOMY  1979   SHOULDER OPEN ROTATOR CUFF REPAIR  07/05/2011   Procedure: ROTATOR CUFF REPAIR SHOULDER OPEN;  Surgeon: Jacki Cones, MD;  Location: [facility];  Service: Orthopedics;  Laterality: Right;   TOTAL KNEE ARTHROPLASTY Right 09/09/2014   Procedure: TOTAL RIGHT  KNEE ARTHROPLASTY;  Surgeon: Ranee Gosselin, MD;  Location: [facility];  Service: Orthopedics;  Laterality: Right;   Social History:  reports that she quit smoking about 57 years ago. Her smoking use  included cigarettes. She has a 3.25 pack-year smoking history. She has never used smokeless tobacco. She reports that she does not drink alcohol and does not use drugs.  Allergies  Allergen Reactions   Codeine Nausea And Vomiting   Darvocet [Propoxyphene N-Acetaminophen] Nausea And Vomiting   Vicodin [Hydrocodone-Acetaminophen] Other (See Comments)    Unknown reactions   Nickel Rash   Sulfa Antibiotics Rash    Like a sunburn    Family History  Problem Relation Age of Onset   Heart disease Mother    Cancer Mother    Varicose Veins Mother    Deep vein thrombosis Mother    Diabetes Mother    Hypertension Mother    Depression Mother    Obesity Mother    Heart disease Father    AAA (abdominal aortic aneurysm) Father    Varicose Veins Sister    Cancer Sister    Diabetes Sister    Heart disease Sister 86   Hypertension Sister    Vision loss Sister    Diabetes Brother    Vision loss Brother    Peripheral vascular disease Brother    AAA (abdominal aortic aneurysm) Brother    Diabetes Sister    Heart disease Sister    Hypertension Sister    Vision loss Sister    Diabetes Sister    Vision loss Sister     Prior to Admission medications   Medication Sig Start Date End Date Taking? Authorizing Provider  acetaminophen (TYLENOL) 500 MG tablet Take 1,000 mg by mouth 3 (three) times daily.   Yes [provider]  atorvastatin (LIPITOR) 20 MG tablet TAKE 1 TABLET AT BEDTIME Patient taking differently: Take 20 mg by mouth daily. 09/26/21  Yes Willow Ora, MD  B Complex Vitamins (VITAMIN-B COMPLEX) TABS Take 1 tablet by mouth daily.    Yes [provider]  Cholecalciferol (VITAMIN D3) 1000 units CAPS Take 1,000 Units by mouth daily.    Yes [provider]  clobetasol ointment (TEMOVATE) 0.05 % Apply 1 Application topically daily as needed (for eczema).   Yes [provider]  cyclobenzaprine (FLEXERIL) 10 MG tablet Take 1 tablet (10 mg total) by mouth  3 (three) times daily as needed for muscle spasms. 11/11/20  Yes Willow Ora, MD  gabapentin (NEURONTIN) 100 MG capsule TAKE 1 CAPSULE(100 MG) BY MOUTH THREE TIMES DAILY Patient taking differently: Take 100 mg by mouth 3 (three) times daily. 02/28/21  Yes Willow Ora, MD  levothyroxine (SYNTHROID) 50 MCG tablet Take 1 tablet (50 mcg total) by mouth daily before breakfast. 11/03/21  Yes Willow Ora, MD  losartan-hydrochlorothiazide [facility]) 50-12.5 MG tablet TAKE 1 TABLET DAILY 03/15/22  Yes Willow Ora, MD  Multiple Vitamins-Minerals (PRESERVISION AREDS PO) Take 1 tablet by mouth 2 (two) times daily.    Yes [provider]  nystatin (MYCOSTATIN/NYSTOP) powder Apply 1 Application topically daily as needed (for rash/redness).  Yes [provider]  oxyCODONE (OXY IR/ROXICODONE) 5 MG immediate release tablet Take 1 tablet (5 mg total) by mouth 2 (two) times daily as needed for severe pain. 12/06/20  Yes Willow Ora, MD  pantoprazole (PROTONIX) 40 MG tablet TAKE 1 TABLET(40 MG) BY MOUTH DAILY Patient taking differently: Take 40 mg by mouth daily. 12/08/21  Yes Willow Ora, MD  polyvinyl alcohol (LIQUIFILM TEARS) 1.4 % ophthalmic solution Place 1 drop into both eyes daily as needed for dry eyes.   Yes [provider]    Physical Exam: Vitals:   04/08/22 0432 04/08/22 0500 04/08/22 0545 04/08/22 0653  BP: (!) 154/58 (!) 149/67 139/66 (!) 133/55  Pulse: (!) 108 (!) 114 (!) 115 (!) 113  Resp: (!) 21  Temp:  100.3 F (37.9 C)    TempSrc:  Rectal    SpO2: 100% 100% 100% 99%  Weight:      Height:       Exam  Constitutional: Elderly female who is currently lethargic and not easily arousable Eyes: PERRL, lids and conjunctivae normal ENMT: Mucous membranes are dry.  Poor dentition noted of the left back molars Neck: normal, supple, no masses, no thyromegaly Respiratory: clear to auscultation bilaterally, no wheezing, no crackles. Normal  respiratory effort.   Cardiovascular: Tachycardic with no lower extremity swelling appreciated at this time. Abdomen: Abdomen is soft with bowel sounds appreciated in all 4 quadrants. Musculoskeletal: no clubbing / cyanosis. No joint deformity upper and lower extremities.  Normal muscle tone.  Skin: Erythema of the left side of the face with increased warmth  Neurologic: CN 2-12 grossly intact.  Appears able to move all extremities Psychiatric: Lethargic unable to assess at this time  Data Reviewed:  EKG appears to show sinus rhythm with left bundle branch block at 94 bpm.  Confirmed with Dr. Rennis Golden of cardiology who agreed appear to be a sinus rhythm.  Reviewed labs, imaging and pertinent records as noted above in HPI.  Assessment and Plan:  Severe sepsis secondary to UTI Acute.  Patient presented after reportedly having urinary incontinence and frequency with increased weakness.  Patient noted to have temperature up to 100.3 F with tachycardia, tachypnea, and white blood cell count elevated up to 33.7.  Urinalysis positive for signs of infection.  Blood and urine cultures have been obtained lactic acid initially 3.3, but trended down to 2.6 after receiving IV fluids.  Patient had been given empiric antibiotics of Augmentin p.o and Rocephin IV. -Admit to a telemetry bed, but changed to progressive due to blood pressures -Follow-up blood and urine cultures -Continue empiric antibiotics of Rocephin -Check CT scan of the abdomen pelvis without contrast -Acetaminophen as needed fever -Trend lactic acid levels -Check CBC tomorrow morning  Transient hypotension Acute.  Patient was noted to have blood pressure as low as 70/50 upon EMS arrival.  Patient was on a diuretic and an acute infection as the likely cause.  Blood pressures improved after initial bolus of 400 mL of fluid. -Hold home blood pressure medications of losartan hydrochlorothiazide -Goal MAP greater than 65.  Adjust fluids as  needed  Acute metabolic encephalopathy Patient was noted to be confused and not her normal self.  Lives alone and oriented x3.  CT scan of the head did not note any acute abnormality.  At this time patient is lethargic, but suspect secondary to pain medication recently given.    -Neurochecks -May warrant further work-up if symptoms persist  Possible cellulitis  of the face Patient noted to have significant erythema of the face with increased warmth concern for possible cellulitis. -Antibiotics as noted above  Elevated troponin Acute.  High-sensitivity troponins 34-> 27.  EKG shows sinus rhythm with left bundle branch block unchanged.  Family had not noted the patient complaining of any chest pain.  Suspect secondary to demand. -Continue to monitor  Acute kidney injury Creatinine elevated at 1.63 with BUN 28.  Baseline creatinine appears to have been around 0.7.  This is greater than 0.3 increased from baseline to suggest acute kidney injury.  Suspect secondary to acute infection and/or diuretic. -Monitor intake and output -Continue normal saline IV fluids at 125 mL/h as tolerated -Recheck kidney function  Hypokalemia Acute.  Potassium 3.3. -Give 20 mEq potassium chloride IV  Left bundle branch block Chronic.  Left bundle branch block seen present on prior EKGs since 2016.  Normocytic anemia Acute.  Hemoglobin noted to be 11.4, but previously had ranged between 13-14.  Patient was noted to have some signs of hematuria on urinalysis.  No other reports of bleeding. -Recheck H&H  Thrombocytopenia Acute.  Platelet count 146 on admission.   -Continue to monitor  Hypothyroidism -Check TSH -Continue levothyroxine  Left leg and back pain Patient has chronic pain issues with her hip thought to be secondary to trochanteric bursitis for which she had just received a hip injection on 11/9.  She also has issues with her back and has had prior fusion.  She had been given 10 mg which may have  been too much in the ED and normally takes 5 mg -Continue oxycodone 5 mg as needed pain  History of DVT Patient with prior history of DVT per family during time of childbirth, but is not on chronic anticoagulation. -Check D-dimer due to hypotension and SPECT syncope  Hyperlipidemia -Continue atorvastatin  DVT prophylaxis: Lovenox  Advance Care Planning:   Code Status: Prior   Consults: PT/OT, TOC  Family Communication: Daughter-in-law updated at bedside  Severity of Illness: The appropriate patient status for this patient is INPATIENT. Inpatient status is judged to be reasonable and necessary in order to provide the required intensity of service to ensure the patient's safety. The patient's presenting symptoms, physical exam findings, and initial radiographic and laboratory data in the context of their chronic comorbidities is felt to place them at high risk for further clinical deterioration. Furthermore, it is not anticipated that the patient will be medically stable for discharge from the hospital within 2 midnights of admission.   * I certify that at the point of admission it is my clinical judgment that the patient will require inpatient hospital care spanning beyond 2 midnights from the point of admission due to high intensity of service, high risk for further deterioration and high frequency of surveillance required.*  Author: Clydie Braun, MD 04/08/2022 7:41 AM  For on call review [email or web address].

## 2022-04-08 NOTE — ED Notes (Signed)
Pt transported to CT ?

## 2022-04-08 NOTE — ED Notes (Signed)
Pt returned from CT °

## 2022-04-08 NOTE — ED Notes (Signed)
Pt placed on 2L [place]

## 2022-04-08 NOTE — Progress Notes (Signed)
  Echocardiogram 2D Echocardiogram has been performed.  Gerda Diss 04/08/2022, 3:55 PM

## 2022-04-08 NOTE — ED Triage Notes (Signed)
Pt arrived by EMS from home where she lives alone. Pt family called because they were concerned for UTI. Pt has been having urinary incontence and frequency. Pt states that she has chronic back and leg pain that she had a shot for but it doesn't seem to be helping. She states she has been very weak and in pain so she can't get to the bathroom independently.  EMS reports pressure of 70/50 that increased to 108/42 after 400cc NS bolus Pt is alert and oriented.

## 2022-04-08 NOTE — ED Provider Notes (Signed)
[facility] EMERGENCY DEPARTMENT Provider Note   CSN: [id number] Arrival date & time: 04/08/22  0107     History  Chief Complaint  Patient presents with   Weakness    Amber [name] is a 81 y.o. female.  81 year old female who presents the ER today secondary to weakness.  Patient's family states that she has been-itis in the past with UTIs.  He states that they talk to her earlier in the day and she seems to be normal but they talk to her tonight she was complained to have some weakness and some vomiting.  I talked her about 10 to 15 minutes and noticed that she was slurring her words more than normal so they went to evaluate her.  When they got there her sister (who is visiting) states that she was going to the bathroom and was having trouble standing up so they helped her up and when they helped her up with her eyes rolled back to her head she got really pale and likely she was going to pass out.  They are worried that she might die so they called EMS.  On EMS arrival patient was hypotensive.  They started fluids and brought here for further evaluation.  Patient perseverates about her back pain and trochanteric bursitis which she is getting treatments for but also has suprapubic pain.     Weakness      Home Medications Prior to Admission medications   Medication Sig Start Date End Date Taking? Authorizing Provider  acetaminophen (TYLENOL) 500 MG tablet Take 1,000 mg by mouth 3 (three) times daily.   Yes [provider]  atorvastatin (LIPITOR) 20 MG tablet TAKE 1 TABLET AT BEDTIME Patient taking differently: Take 20 mg by mouth daily. 09/26/21  Yes Willow Ora, MD  B Complex Vitamins (VITAMIN-B COMPLEX) TABS Take 1 tablet by mouth daily.    Yes [provider]  Cholecalciferol (VITAMIN D3) 1000 units CAPS Take 1,000 Units by mouth daily.    Yes [provider]  clobetasol ointment (TEMOVATE) 0.05 % Apply 1 Application topically daily as  needed (for eczema).   Yes [provider]  cyclobenzaprine (FLEXERIL) 10 MG tablet Take 1 tablet (10 mg total) by mouth 3 (three) times daily as needed for muscle spasms. 11/11/20  Yes Willow Ora, MD  gabapentin (NEURONTIN) 100 MG capsule TAKE 1 CAPSULE(100 MG) BY MOUTH THREE TIMES DAILY Patient taking differently: Take 100 mg by mouth 3 (three) times daily. 02/28/21  Yes Willow Ora, MD  levothyroxine (SYNTHROID) 50 MCG tablet Take 1 tablet (50 mcg total) by mouth daily before breakfast. 11/03/21  Yes Willow Ora, MD  losartan-hydrochlorothiazide [facility]) 50-12.5 MG tablet TAKE 1 TABLET DAILY 03/15/22  Yes Willow Ora, MD  Multiple Vitamins-Minerals (PRESERVISION AREDS PO) Take 1 tablet by mouth 2 (two) times daily.    Yes [provider]  nystatin (MYCOSTATIN/NYSTOP) powder Apply 1 Application topically daily as needed (for rash/redness).   Yes [provider]  oxyCODONE (OXY IR/ROXICODONE) 5 MG immediate release tablet Take 1 tablet (5 mg total) by mouth 2 (two) times daily as needed for severe pain. 12/06/20  Yes Willow Ora, MD  pantoprazole (PROTONIX) 40 MG tablet TAKE 1 TABLET(40 MG) BY MOUTH DAILY Patient taking differently: Take 40 mg by mouth daily. 12/08/21  Yes Willow Ora, MD  polyvinyl alcohol (LIQUIFILM TEARS) 1.4 % ophthalmic solution Place 1 drop into both eyes daily as needed for dry eyes.  Yes [provider]      Allergies    Codeine, Darvocet [propoxyphene n-acetaminophen], Vicodin [hydrocodone-acetaminophen], Nickel, and Sulfa antibiotics    Review of Systems   Review of Systems  Neurological:  Positive for weakness.    Physical Exam Updated Vital Signs BP (!) 133/55   Pulse (!) 113   Temp 100.3 F (37.9 C) (Rectal)   Resp (!) 21   Ht 5\' 2"  (1.575 m)   Wt 79.4 kg   LMP  (LMP Unknown)   SpO2 99%   BMI 32.01 kg/m  Physical Exam Vitals and nursing note reviewed.  Constitutional:      Appearance: She is  well-developed.  HENT:     Head: Normocephalic and atraumatic.     Nose: No congestion or rhinorrhea.     Mouth/Throat:     Mouth: Mucous membranes are moist.     Pharynx: Oropharynx is clear.  Eyes:     Pupils: Pupils are equal, round, and reactive to light.  Cardiovascular:     Rate and Rhythm: Normal rate and regular rhythm.  Pulmonary:     Effort: No respiratory distress.     Breath sounds: No stridor.  Abdominal:     General: Abdomen is flat. There is no distension.     Tenderness: There is abdominal tenderness (suprapubic).  Musculoskeletal:        General: No swelling or tenderness. Normal range of motion.     Cervical back: Normal range of motion.  Skin:    General: Skin is warm and dry.     Findings: Erythema (bilateral cheeks) present.  Neurological:     General: No focal deficit present.     Mental Status: She is alert.     ED Results / Procedures / Treatments   Labs (all labs ordered are listed, but only abnormal results are displayed) Labs Reviewed  CBC WITH DIFFERENTIAL/PLATELET - Abnormal; Notable for the following components:      Result Value   WBC 33.7 (*)    RBC 3.55 (*)    Hemoglobin 11.4 (*)    HCT 33.2 (*)    Platelets 146 (*)    Neutro Abs 28.4 (*)    Monocytes Absolute 2.5 (*)    Abs Immature Granulocytes 1.41 (*)    All other components within normal limits  COMPREHENSIVE METABOLIC PANEL - Abnormal; Notable for the following components:   Potassium 3.3 (*)    Glucose, Bld 165 (*)    BUN 28 (*)    Creatinine, Ser 1.63 (*)    Total Protein 5.6 (*)    Albumin 3.3 (*)    GFR, Estimated 31 (*)    All other components within normal limits  URINALYSIS, ROUTINE W REFLEX MICROSCOPIC - Abnormal; Notable for the following components:   APPearance CLOUDY (*)    Hgb urine dipstick MODERATE (*)    Protein, ur 100 (*)    Nitrite POSITIVE (*)    Leukocytes,Ua LARGE (*)    WBC, UA >50 (*)    Bacteria, UA MANY (*)    All other components within  normal limits  LACTIC ACID, PLASMA - Abnormal; Notable for the following components:   Lactic Acid, Venous 3.3 (*)    All other components within normal limits  LACTIC ACID, PLASMA - Abnormal; Notable for the following components:   Lactic Acid, Venous 2.6 (*)    All other components within normal limits  TROPONIN I (HIGH SENSITIVITY) - Abnormal; Notable for the following components:  Troponin I (High Sensitivity) 34 (*)    All other components within normal limits  TROPONIN I (HIGH SENSITIVITY) - Abnormal; Notable for the following components:   Troponin I (High Sensitivity) 27 (*)    All other components within normal limits  CULTURE, BLOOD (ROUTINE X 2)  CULTURE, BLOOD (ROUTINE X 2)  URINE CULTURE  MAGNESIUM    EKG EKG Interpretation  Date/Time:  Saturday April 08 2022 01:24:58 EST Ventricular Rate:  94 PR Interval:    QRS Duration: 135 QT Interval:  385 QTC Calculation: 482 R Axis:   1 Text Interpretation: Atrial fibrillation Left bundle branch block Confirmed by Marily Memos [phone]) on 04/08/2022 4:15:52 AM  Radiology CT Head Wo Contrast  Result Date: 04/08/2022 CLINICAL DATA:  Mental status change EXAM: CT HEAD WITHOUT CONTRAST TECHNIQUE: Contiguous axial images were obtained from the base of the skull through the vertex without intravenous contrast. RADIATION DOSE REDUCTION: This exam was performed according to the departmental dose-optimization program which includes automated exposure control, adjustment of the mA and/or kV according to patient size and/or use of iterative reconstruction technique. COMPARISON:  10/16/2011 CT head, correlation is also made with 04/04/2018 MRI head FINDINGS: Brain: No evidence of acute infarction, hemorrhage, mass, mass effect, or midline shift. No hydrocephalus or extra-axial fluid collection. Periventricular white matter changes, likely the sequela of chronic small vessel ischemic disease. Vascular: No hyperdense vessel.  Atherosclerotic calcifications in the intracranial carotid and vertebral arteries. Skull: Normal. Negative for fracture or focal lesion. Sinuses/Orbits: No acute finding. Other: The mastoid air cells are well aerated. IMPRESSION: No acute intracranial process. Electronically Signed   By: Wiliam Ke M.D.   On: 04/08/2022 03:53   DG Chest Portable 1 View  Result Date: 04/08/2022 CLINICAL DATA:  Clinical concern for pneumonia. EXAM: PORTABLE CHEST 1 VIEW COMPARISON:  09/01/2014. FINDINGS: The heart size and mediastinal contours are within normal limits. There is atherosclerotic calcification of the aorta. No consolidation, effusion, or pneumothorax. No acute osseous abnormality. IMPRESSION: No active disease. Electronically Signed   By: Thornell Sartorius M.D.   On: 04/08/2022 02:32    Procedures .Critical Care  Performed by: Marily Memos, MD Authorized by: Marily Memos, MD   Critical care provider statement:    Critical care time (minutes):  30   Critical care was necessary to treat or prevent imminent or life-threatening deterioration of the following conditions:  Sepsis   Critical care was time spent personally by me on the following activities:  Development of treatment plan with patient or surrogate, discussions with consultants, evaluation of patient's response to treatment, examination of patient, ordering and review of laboratory studies, ordering and review of radiographic studies, ordering and performing treatments and interventions, pulse oximetry, re-evaluation of patient's condition and review of old charts     Medications Ordered in ED Medications  lactated ringers bolus 1,000 mL (0 mLs Intravenous Stopped 04/08/22 0315)  lactated ringers bolus 1,000 mL (1,000 mLs Intravenous New Bag/Given 04/08/22 0630)  cefTRIAXone (ROCEPHIN) 2 g in sodium chloride 0.9 % 100 mL IVPB (2 g Intravenous New Bag/Given 04/08/22 0657)  amoxicillin-clavulanate (AUGMENTIN) 875-125 MG per tablet 1 tablet  (1 tablet Oral Given 04/08/22 0636)  oxyCODONE-acetaminophen (PERCOCET/ROXICET) 5-325 MG per tablet 2 tablet (2 tablets Oral Given 04/08/22 0636)  acetaminophen (TYLENOL) tablet 650 mg (650 mg Oral Given 04/08/22 6433)    ED Course/ Medical Decision Making/ A&P  Medical Decision Making Amount and/or Complexity of Data Reviewed Labs: ordered. Radiology: ordered. ECG/medicine tests: ordered.  Risk OTC drugs. Prescription drug management. Decision regarding hospitalization.   81 year old female that presents the ER today for weakness.  Ultimately found to have severe sepsis related to a urinary tract infection.  Troponin is elevated but improved with fluids as did her lactic acid so suspect these are probably secondary to the hypotension she experienced.  Head CT was negative.  Chest x-ray was clear.  Her urine was obviously infected.  White count was significantly high at 33 some this might have been exacerbated by recent steroid shot however does not explain all of it.  After speaking to the family Acelity the been try to get into assisted living for a while so this is likely what her plan of care should be at discharge.  Also of note her left cheek got significantly red while she was in the ER.  Initially she had some mild erythema of both cheeks but the right cheek had improved significantly to her left cheek began worsening Canna spreading down her neck.  May be cellulitis it is little bit firm and warm.  If so the Rocephin should cover it.  Admitted to the hospitalist at this time.  Final Clinical Impression(s) / ED Diagnoses Final diagnoses:  Severe sepsis (HCC)  Urinary tract infection without hematuria, site unspecified  Elevated troponin  Leukocytosis, unspecified type    Rx / DC Orders ED Discharge Orders     None         Lillian Tigges, Barbara Cower, MD 04/08/22 [phone]

## 2022-04-08 NOTE — ED Notes (Signed)
Pt's BP 87/48. Admitting MD at bedside.

## 2022-04-08 NOTE — Consult Note (Signed)
NAME:  Amber [name], MRN:  [id number], DOB:  Sep 18, 1940, LOS: 0 ADMISSION DATE:  04/08/2022, CONSULTATION DATE: 04/08/2022 REFERRING MD: Dr. 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 rate 20, height 5\' 2"  (1.575 m), weight 79.4 kg, SpO2 98 %.        Intake/Output Summary (Last 24 hours) at 04/08/2022 1448 Last data filed at 04/08/2022 1011 Gross per 24 hour  Intake 1500 ml  Output 200 ml  Net 1300 ml   Filed Weights   04/08/22 0139  Weight: 79.4 kg    Examination: General: Elderly lady, does not appear to be in distress HENT: Moist oral mucosa Lungs: Clear breath sounds Cardiovascular: S1-S2 appreciated Abdomen: Bowel sounds appreciated Extremities: No clubbing, no edema Neuro: Alert and oriented x3 GU:   Resolved Hospital Problem list     Assessment & Plan:  Pyelonephritis Sepsis -Patient on ceftriaxone -Continue antibiotics  Septic shock -Continue fluid resuscitation -Start on  peripheral pressors  Chronic back pain Lumbar radiculopathy -Will continue pain medications -Neurontin and oxy -Patient on these medications chronically and does suffer from debilitating pain  Leukocytosis -Related to ongoing infection  Hypokalemia -Replete  Mild chronic anemia  Best Practice (right click and "Reselect all SmartList Selections" daily)   Diet/type: Regular consistency (see orders) DVT prophylaxis: LMWH GI prophylaxis: N/A Lines: N/A Foley:  N/A Code Status:  full code Last date of multidisciplinary goals of care discussion [discussed with patient's son and daughter-in-law at bedside]  Labs   CBC: Recent Labs  Lab 04/08/22 0224 04/08/22 0859  WBC 33.7*  --   NEUTROABS 28.4*  --   HGB 11.4* 11.4*  HCT 33.2* 33.7*  MCV 93.5  --   PLT 146*  --     Basic Metabolic Panel: Recent Labs  Lab 04/08/22 0224  NA 136  K 3.3*  CL 102  CO2 24  GLUCOSE 165*  BUN 28*  CREATININE 1.63*  CALCIUM 9.0  MG 1.9   GFR: Estimated Creatinine Clearance: 25.5 mL/min (A) (by C-G formula based on SCr of 1.63 mg/dL (H)). Recent Labs  Lab 04/08/22 0224 04/08/22 0413 04/08/22 0859 04/08/22 1308  WBC 33.7*  --   --   --   LATICACIDVEN 3.3* 2.6* 2.6* 2.8*    Liver Function Tests: Recent Labs  Lab 04/08/22 0224  AST 36  ALT 28  ALKPHOS 58  BILITOT 0.9  PROT 5.6*  ALBUMIN 3.3*   No results for input(s): "LIPASE", "AMYLASE" in  the last 168 hours. No results for input(s): "AMMONIA" in the last 168 hours.  ABG    Component Value Date/Time   TCO2 29 04/04/2018 0853     Coagulation Profile: No results for input(s): "INR", "PROTIME" in the last 168 hours.  Cardiac Enzymes: No results for input(s): "CKTOTAL", "CKMB", "CKMBINDEX", "TROPONINI" in the last 168 hours.  HbA1C: Hgb A1c MFr Bld  Date/Time Value Ref Range Status  03/15/2021 11:39 AM 5.9 4.6 - 6.5 % Final    Comment:    Glycemic Control Guidelines for People with Diabetes:Non Diabetic:   <6%Goal of Therapy: <7%Additional Action Suggested:  >8%   03/17/2020 11:58 AM 5.8 (H) 4.8 - 5.6 % Final    Comment:             Prediabetes: 5.7 - 6.4          Diabetes: >6.4          Glycemic control for adults with diabetes: <7.0     CBG: No results for input(s): "GLUCAP" in the last 168 hours.  Review of Systems:   Denies pain or discomfort at present  Past Medical History:  She,  has a past medical history of Arthritis, DVT (deep venous thrombosis) (HCC), Family history of blood clots, blood clots, Hypertension, Hypothyroidism, Left bundle branch block, Macular degeneration, Peripheral vascular disease (HCC), Post-phlebitic syndrome (04/18/2013), Rotator cuff rupture, complete (07/05/2011), and Varicose veins.   Surgical History:   Past Surgical History:  Procedure Laterality Date   APPENDECTOMY  1979   BACK SURGERY  2009   ENDOVENOUS ABLATION SAPHENOUS VEIN W/ LASER Right 09/15/2013   right greater saphenous vein, by Gretta Began MD   ENDOVENOUS ABLATION SAPHENOUS VEIN W/ LASER Left 10/16/2013   left greater saphenous vein, by Gretta Began MD   left open rotator cuff surgery   2010    lumbar infusion  01/16/2008   PARTIAL HYSTERECTOMY  1979   SHOULDER OPEN ROTATOR CUFF REPAIR  07/05/2011   Procedure: ROTATOR CUFF REPAIR SHOULDER OPEN;  Surgeon: Jacki Cones, MD;  Location: [facility];  Service: Orthopedics;  Laterality: Right;   TOTAL KNEE ARTHROPLASTY Right 09/09/2014   Procedure: TOTAL RIGHT  KNEE ARTHROPLASTY;  Surgeon: Ranee Gosselin, MD;  Location: [facility];  Service: Orthopedics;  Laterality: Right;     Social History:   reports that she quit smoking about 57 years ago. Her smoking use included cigarettes. She has a 3.25 pack-year smoking history. She has never used smokeless tobacco. She reports that she does not drink alcohol and does not use drugs.   Family History:  Her family history includes AAA (abdominal aortic aneurysm) in her brother and father; Cancer in her  mother and sister; Deep vein thrombosis in her mother; Depression in her mother; Diabetes in her brother, mother, sister, sister, and sister; Heart disease in her father, mother, and sister; Heart disease (age of onset: 55) in her sister; Hypertension in her mother, sister, and sister; Obesity in her mother; Peripheral vascular disease in her brother; Varicose Veins in her mother and sister; Vision loss in her brother, sister, sister, and sister.   Allergies Allergies  Allergen Reactions   Codeine Nausea And Vomiting   Darvocet [Propoxyphene N-Acetaminophen] Nausea And Vomiting   Vicodin [Hydrocodone-Acetaminophen] Other (See Comments)    Unknown reactions   Nickel Rash   Sulfa Antibiotics Rash    Like a sunburn    The patient is critically ill with multiple organ systems failure and requires high complexity  decision making for assessment and support, frequent evaluation and titration of therapies, application of advanced monitoring technologies and extensive interpretation of multiple databases. Critical Care Time devoted to patient care services described in this note independent of APP/resident time (if applicable)  is 30 minutes.   Virl Diamond MD [facility] Personal pager: See Amion If unanswered, please page CCM On-call: #[phone]

## 2022-04-08 NOTE — Progress Notes (Signed)
eLink Physician-Brief Progress Note Patient Name: Amber [name] DOB: June 04, 1940 MRN: [id number]   Date of Service  04/08/2022  HPI/Events of Note  Pan-sensitive E.coli 2 of 4 blood cultures. ?facial erythema/cellulitis  PCCM admitted today for shock and now off pressors this evening  eICU Interventions  Continue Vanc and Ceftriaxone for now Will defer to dayteam to de-escalate in am on reassessment     Intervention Category Intermediate Interventions: Other:  Luiz Trumpower Mechele Collin 04/08/2022, 10:01 PM

## 2022-04-08 NOTE — ED Notes (Signed)
Patient transported to Vascular 

## 2022-04-09 DIAGNOSIS — N12 Tubulo-interstitial nephritis, not specified as acute or chronic: Secondary | ICD-10-CM | POA: Diagnosis not present

## 2022-04-09 DIAGNOSIS — I214 Non-ST elevation (NSTEMI) myocardial infarction: Secondary | ICD-10-CM

## 2022-04-09 LAB — BASIC METABOLIC PANEL
Anion gap: 15 (ref 5–15)
BUN: 31 mg/dL — ABNORMAL HIGH (ref 8–23)
CO2: 18 mmol/L — ABNORMAL LOW (ref 22–32)
Calcium: 8.2 mg/dL — ABNORMAL LOW (ref 8.9–10.3)
Chloride: 103 mmol/L (ref 98–111)
Creatinine, Ser: 1.09 mg/dL — ABNORMAL HIGH (ref 0.44–1.00)
GFR, Estimated: 50 mL/min — ABNORMAL LOW (ref >60)
Glucose, Bld: 122 mg/dL — ABNORMAL HIGH (ref 70–99)
Potassium: 4.2 mmol/L (ref 3.5–5.1)
Sodium: 136 mmol/L (ref 135–145)

## 2022-04-09 LAB — CBC
HCT: 30.1 % — ABNORMAL LOW (ref 36.0–46.0)
Hemoglobin: 10.5 g/dL — ABNORMAL LOW (ref 12.0–15.0)
MCH: 32.2 pg (ref 26.0–34.0)
MCHC: 34.9 g/dL (ref 30.0–36.0)
MCV: 92.3 fL (ref 80.0–100.0)
Platelets: 112 10*3/uL — ABNORMAL LOW (ref 150–400)
RBC: 3.26 MIL/uL — ABNORMAL LOW (ref 3.87–5.11)
RDW: 15 % (ref 11.5–15.5)
WBC: 37 10*3/uL — ABNORMAL HIGH (ref 4.0–10.5)
nRBC: 0 % (ref 0.0–0.2)

## 2022-04-09 LAB — LACTIC ACID, PLASMA: Lactic Acid, Venous: 2.8 mmol/L (ref 0.5–1.9)

## 2022-04-09 LAB — TROPONIN I (HIGH SENSITIVITY)
Troponin I (High Sensitivity): 243 ng/L (ref <18)
Troponin I (High Sensitivity): 308 ng/L (ref <18)

## 2022-04-09 MED ORDER — METOPROLOL TARTRATE 25 MG PO TABS
25.0000 mg | ORAL_TABLET | Freq: Two times a day (BID) | ORAL | Status: DC
  Administered 2022-04-09 – 2022-04-16 (×14): 25 mg via ORAL
  Filled 2022-04-09 (×15): qty 1

## 2022-04-09 MED ORDER — ASPIRIN 81 MG PO TBEC
81.0000 mg | DELAYED_RELEASE_TABLET | Freq: Every day | ORAL | Status: DC
  Administered 2022-04-10 – 2022-04-16 (×6): 81 mg via ORAL
  Filled 2022-04-09 (×6): qty 1

## 2022-04-09 MED ORDER — CLOPIDOGREL BISULFATE 75 MG PO TABS
75.0000 mg | ORAL_TABLET | Freq: Every day | ORAL | Status: DC
  Administered 2022-04-10 – 2022-04-11 (×2): 75 mg via ORAL
  Filled 2022-04-09 (×2): qty 1

## 2022-04-09 MED ORDER — ASPIRIN 325 MG PO TABS
325.0000 mg | ORAL_TABLET | Freq: Once | ORAL | Status: AC
  Administered 2022-04-09: 325 mg via ORAL
  Filled 2022-04-09: qty 1

## 2022-04-09 MED ORDER — MORPHINE SULFATE (PF) 2 MG/ML IV SOLN
2.0000 mg | Freq: Once | INTRAVENOUS | Status: AC
  Administered 2022-04-09: 2 mg via INTRAVENOUS
  Filled 2022-04-09: qty 1

## 2022-04-09 MED ORDER — ORAL CARE MOUTH RINSE: 15.0000 mL | OROMUCOSAL | Status: DC | PRN

## 2022-04-09 MED ORDER — CLOPIDOGREL BISULFATE 75 MG PO TABS
600.0000 mg | ORAL_TABLET | Freq: Once | ORAL | Status: AC
  Administered 2022-04-09: 600 mg via ORAL
  Filled 2022-04-09: qty 8

## 2022-04-09 MED ORDER — HEPARIN (PORCINE) 25000 UT/250ML-% IV SOLN
1100.0000 [IU]/h | INTRAVENOUS | Status: DC
  Administered 2022-04-09: 1000 [IU]/h via INTRAVENOUS
  Administered 2022-04-10: 1100 [IU]/h via INTRAVENOUS
  Filled 2022-04-09 (×2): qty 250

## 2022-04-09 MED ORDER — HEPARIN BOLUS VIA INFUSION
2000.0000 [IU] | Freq: Once | INTRAVENOUS | Status: AC
  Administered 2022-04-09: 2000 [IU] via INTRAVENOUS
  Filled 2022-04-09: qty 2000

## 2022-04-09 MED ORDER — ENOXAPARIN SODIUM 40 MG/0.4ML IJ SOSY
40.0000 mg | PREFILLED_SYRINGE | INTRAMUSCULAR | Status: DC
  Administered 2022-04-09: 40 mg via SUBCUTANEOUS
  Filled 2022-04-09: qty 0.4

## 2022-04-09 NOTE — Progress Notes (Signed)
Patient was seen at bedside  Chest pain is better  Troponin bump noted from the 30s to 243    We will start heparin, aspirin, beta-blocker  Transfer to progressive floor  Spoke with Harrie Jeans daughter-in-law, also spoke with son RoyGreer   Paged cardiology fellow, waiting for callback

## 2022-04-09 NOTE — Progress Notes (Signed)
Informed about patient complaining of some chest pain  EKG reviewed with left bundle branch block, mild axis change compared to yesterday  Obtain cardiac enzymes  Oxygen supplementation to keep saturations above 94% if needed  Morphine 2 mg ordered

## 2022-04-09 NOTE — Consult Note (Signed)
Cardiology Consultation   Patient ID: Amber [name] MRN: [id number]; DOB: Jun 16, 1940  Admit date: 04/08/2022 Date of Consult: 04/09/2022  PCP:  Leamon Arnt, [place] Providers Cardiologist:  None        Patient Profile:   Amber [name] is a 81 y.o. female with a hx of hypertension, known LBBB, PAD, hypothyroidism, who is being seen 04/09/2022 for the evaluation of chest pain and troponin evaluation at the request of Dr. Ander Slade.  History of Present Illness:   Amber [name] initially presented on 11/11 for altered mental status, decreased p.o. intake and found to be hypotensive.  Found to have E. coli bacteremia and CT imaging consistent with pyelonephritis.  Also noted were incidental atherosclerotic calcification of the aorta and branch vessels.  She has been maintained on IV ceftriaxone and has not required pressors to this point.  On initial arrival, BMP with AKI, significant leukocytosis to 33 K, PLT 146 and downtrending to 112 most recently.  Had a persistently elevated lactate ranging from 2.6-2.8.  Initial troponin 27.  ECG with known LBBB on presentation and in sinus rhythm (though machine read as atrial fibrillation which is not accurate).  Echocardiogram was obtained on 11/11 which revealed mildly depressed LV systolic function at [id number] with no regional wall motion abnormalities.  Normal RV function.  No significant valvular disease.  IVC normal.  On 11/12, she developed significant 10/10 chest pain.  During my interview, she stated that she has had multiple weeks of typical exertional angina with associated dyspnea.  Reports improvement with rest after a few minutes.  She has not had the symptoms before.  Denies a previous history of heart attack or stroke.  No previous intracranial or GI or GU bleeding.  She confirms that earlier today when she was performing mild ambulation, she developed substernal chest pressure that persisted despite rest.  She feels that  the pain has completely dissipated by this point following IV morphine.  She has never had this intense of pain before.  Denies heart racing.  Repeat troponin 243 (from 27 yesterday) and IV heparin was started by primary team.  Repeat EKG with previous LBBB with negative Sgarbossa criteria.  She lives at home by herself.  Stop smoking in her 95s.  Does not drink alcohol.  States that she has become more depressed over the last weeks to months due to feeling alone and having multiple health related issues.  As a strong support community in her church.   Past Medical History:  Diagnosis Date   Arthritis    DVT (deep venous thrombosis) (HCC)    Family history of blood clots    Hx of blood clots    Hypertension    Hypothyroidism    Left bundle branch block    Macular degeneration    Peripheral vascular disease ([place])    Post-phlebitic syndrome 04/18/2013   Rotator cuff rupture, complete 07/05/2011   Varicose veins     Past Surgical History:  Procedure Laterality Date   APPENDECTOMY  1979   BACK SURGERY  2009   ENDOVENOUS ABLATION SAPHENOUS VEIN W/ LASER Right 09/15/2013   right greater saphenous vein, by Curt Jews MD   ENDOVENOUS ABLATION SAPHENOUS VEIN W/ LASER Left 10/16/2013   left greater saphenous vein, by Curt Jews MD   left open rotator cuff surgery   2010    lumbar infusion  01/16/2008   PARTIAL HYSTERECTOMY  1979   SHOULDER OPEN ROTATOR CUFF  REPAIR  07/05/2011   Procedure: ROTATOR CUFF REPAIR SHOULDER OPEN;  Surgeon: Tobi Bastos, MD;  Location: [facility];  Service: Orthopedics;  Laterality: Right;   TOTAL KNEE ARTHROPLASTY Right 09/09/2014   Procedure: TOTAL RIGHT  KNEE ARTHROPLASTY;  Surgeon: Latanya Maudlin, MD;  Location: [facility];  Service: Orthopedics;  Laterality: Right;     Home Medications:  Prior to Admission medications   Medication Sig Start Date End Date Taking? Authorizing Provider  acetaminophen (TYLENOL) 500 MG tablet Take 1,000 mg by mouth 3 (three) times  daily.   Yes [provider]  atorvastatin (LIPITOR) 20 MG tablet TAKE 1 TABLET AT BEDTIME Patient taking differently: Take 20 mg by mouth daily. 09/26/21  Yes Leamon Arnt, MD  B Complex Vitamins (VITAMIN-B COMPLEX) TABS Take 1 tablet by mouth daily.    Yes [provider]  Cholecalciferol (VITAMIN D3) 1000 units CAPS Take 1,000 Units by mouth daily.    Yes [provider]  clobetasol ointment (TEMOVATE) [id number] % Apply 1 Application topically daily as needed (for eczema).   Yes [provider]  cyclobenzaprine (FLEXERIL) 10 MG tablet Take 1 tablet (10 mg total) by mouth 3 (three) times daily as needed for muscle spasms. 11/11/20  Yes Leamon Arnt, MD  gabapentin (NEURONTIN) 100 MG capsule TAKE 1 CAPSULE(100 MG) BY MOUTH THREE TIMES DAILY Patient taking differently: Take 100 mg by mouth 3 (three) times daily. 02/28/21  Yes Leamon Arnt, MD  levothyroxine (SYNTHROID) 50 MCG tablet Take 1 tablet (50 mcg total) by mouth daily before breakfast. 11/03/21  Yes Leamon Arnt, MD  losartan-hydrochlorothiazide [facility]) 50-12.5 MG tablet TAKE 1 TABLET DAILY 03/15/22  Yes Leamon Arnt, MD  Multiple Vitamins-Minerals (PRESERVISION AREDS PO) Take 1 tablet by mouth 2 (two) times daily.    Yes [provider]  nystatin (MYCOSTATIN/NYSTOP) powder Apply 1 Application topically daily as needed (for rash/redness).   Yes [provider]  oxyCODONE (OXY IR/ROXICODONE) 5 MG immediate release tablet Take 1 tablet (5 mg total) by mouth 2 (two) times daily as needed for severe pain. 12/06/20  Yes Leamon Arnt, MD  pantoprazole (PROTONIX) 40 MG tablet TAKE 1 TABLET(40 MG) BY MOUTH DAILY Patient taking differently: Take 40 mg by mouth daily. 12/08/21  Yes Leamon Arnt, MD  polyvinyl alcohol (LIQUIFILM TEARS) 1.4 % ophthalmic solution Place 1 drop into both eyes daily as needed for dry eyes.   Yes [provider]    Inpatient Medications: Scheduled  Meds:  [START ON 04/10/2022] aspirin EC  81 mg Oral Daily   aspirin  325 mg Oral Once   atorvastatin  20 mg Oral Daily   Chlorhexidine Gluconate Cloth  6 each Topical Q0600   clopidogrel  600 mg Oral Once   [START ON 04/10/2022] clopidogrel  75 mg Oral Daily   gabapentin  100 mg Oral TID   heparin  2,000 Units Intravenous Once   levothyroxine  50 mcg Oral Q0600   metoprolol tartrate  25 mg Oral BID   oxyCODONE  5 mg Oral Q6H   pantoprazole  40 mg Oral Daily   sodium chloride flush  3 mL Intravenous Q12H   Continuous Infusions:  sodium chloride 20 mL/hr at 04/08/22 2000   sodium chloride 100 mL/hr at 04/09/22 1159   cefTRIAXone (ROCEPHIN)  IV 200 mL/hr at 04/09/22 0700   heparin     PRN Meds: acetaminophen **OR** acetaminophen, albuterol, clobetasol ointment, docusate sodium, nystatin, ondansetron **OR** ondansetron (  ZOFRAN) IV, mouth rinse, polyethylene glycol, polyvinyl alcohol  Allergies:    Allergies  Allergen Reactions   Codeine Nausea And Vomiting   Darvocet [Propoxyphene N-Acetaminophen] Nausea And Vomiting   Vicodin [Hydrocodone-Acetaminophen] Other (See Comments)    Unknown reactions   Nickel Rash   Sulfa Antibiotics Rash    Like a sunburn    Social History:   Social History   Socioeconomic History   Marital status: Widowed    Spouse name: Not on file   Number of children: Not on file   Years of education: Not on file   Highest education level: Not on file  Occupational History   Occupation: retired  Tobacco Use   Smoking status: Former    Packs/day: 0.25    Years: 13.00    Total pack years: 3.25    Types: Cigarettes    Quit date: 06/22/1964    Years since quitting: 57.8   Smokeless tobacco: Never  Substance and Sexual Activity   Alcohol use: No   Drug use: No   Sexual activity: Never    Birth control/protection: Post-menopausal  Other Topics Concern   Not on file  Social History Narrative   Lives home alone.  Is a widow.  Education: HS grad.  2  children. Caffeine 2.5 cups daily.   Social Determinants of Health   Financial Resource Strain: Low Risk  (11/08/2021)   Overall Financial Resource Strain (CARDIA)    Difficulty of Paying Living Expenses: Not hard at all  Food Insecurity: No Food Insecurity (11/08/2021)   Hunger Vital Sign    Worried About Running Out of Food in the Last Year: Never true    Ran Out of Food in the Last Year: Never true  Transportation Needs: No Transportation Needs (11/08/2021)   PRAPARE - Hydrologist (Medical): No    Lack of Transportation (Non-Medical): No  Physical Activity: Inactive (11/08/2021)   Exercise Vital Sign    Days of Exercise per Week: 0 days    Minutes of Exercise per Session: 0 min  Stress: No Stress Concern Present (11/08/2021)   [place]    Feeling of Stress : Not at all  Social Connections: Moderately Integrated (11/08/2021)   Social Connection and Isolation Panel [NHANES]    Frequency of Communication with Friends and Family: More than three times a week    Frequency of Social Gatherings with Friends and Family: More than three times a week    Attends Religious Services: More than 4 times per year    Active Member of [employer] or Organizations: Yes    Attends Archivist Meetings: 1 to 4 times per year    Marital Status: Widowed  Intimate Partner Violence: Not At Risk (11/08/2021)   Humiliation, Afraid, Rape, and Kick questionnaire    Fear of Current or Ex-Partner: No    Emotionally Abused: No    Physically Abused: No    Sexually Abused: No    Family History:    Family History  Problem Relation Age of Onset   Heart disease Mother    Cancer Mother    Varicose Veins Mother    Deep vein thrombosis Mother    Diabetes Mother    Hypertension Mother    Depression Mother    Obesity Mother    Heart disease Father    AAA (abdominal aortic aneurysm) Father    Varicose Veins Sister     Cancer Sister  Diabetes Sister    Heart disease Sister 27   Hypertension Sister    Vision loss Sister    Diabetes Brother    Vision loss Brother    Peripheral vascular disease Brother    AAA (abdominal aortic aneurysm) Brother    Diabetes Sister    Heart disease Sister    Hypertension Sister    Vision loss Sister    Diabetes Sister    Vision loss Sister      ROS:  Please see the history of present illness.   All other ROS reviewed and negative.     Physical Exam/Data:   Vitals:   04/09/22 1300 04/09/22 1400 04/09/22 1511 04/09/22 1757  BP:  (!) 108/96 (!) 151/91 (!) 162/85  Pulse: (!) 110 (!) 106 (!) 107 98  Resp: 19 (!) 24 16 17   Temp:   97.8 F (36.6 C) 97.7 F (36.5 C)  TempSrc:   Oral Oral  SpO2: 96% 99% 99% 98%  Weight:      Height:        Intake/Output Summary (Last 24 hours) at 04/09/2022 1910 Last data filed at 04/09/2022 1200 Gross per 24 hour  Intake 1930 ml  Output 1450 ml  Net 480 ml      04/09/2022    5:00 AM 04/08/2022    1:39 AM 12/13/2021   11:30 AM  Last 3 Weights  Weight (lbs) 176 lb 9.4 oz 175 lb 175 lb  Weight (kg) 80.1 kg 79.379 kg 79.379 kg     Body mass index is 32.3 kg/m.  General: Frail-appearing and in mild distress HEENT: normal Neck: no JVD Vascular: No carotid bruits; Distal pulses 2+ bilaterally Cardiac: Tachycardic and regular rhythm without murmur Lungs: Mild conversational dyspnea, no crackles posteriorly Abd: soft, nontender, no hepatomegaly  Ext: no edema, SCDs in place Musculoskeletal:  No deformities, BUE and BLE strength normal and equal Skin: warm and dry  Neuro:  CNs 2-12 intact, no focal abnormalities noted Psych: Depressed mood  EKG:  The EKG was personally reviewed and demonstrates: Sinus tachycardia with baseline left bundle branch block and negative Sgarbossa criteria Telemetry:  Telemetry was personally reviewed and demonstrates: Sinus tachycardia  Relevant CV Studies: TTE from 04/08/2022  1.  Left ventricular ejection fraction, by estimation, is 45 to 50%. The  left ventricle has mildly decreased function. The left ventricle has no  regional wall motion abnormalities. There is moderate left ventricular  hypertrophy. Left ventricular  diastolic parameters are consistent with Grade II diastolic dysfunction  (pseudonormalization).   2. Right ventricular systolic function is normal. The right ventricular  size is normal. There is mildly elevated pulmonary artery systolic  pressure.   3. The mitral valve is normal in structure. Mild mitral valve  regurgitation. No evidence of mitral stenosis.   4. The tricuspid valve is abnormal. Tricuspid valve regurgitation is mild  to moderate.   5. The aortic valve is tricuspid. Aortic valve regurgitation is not  visualized. No aortic stenosis is present.   6. The inferior vena cava is dilated in size with >50% respiratory  variability, suggesting right atrial pressure of 8 mmHg.   Comparison(s): No prior Echocardiogram.  Laboratory Data:  High Sensitivity Troponin:   Recent Labs  Lab 04/08/22 0224 04/08/22 0413 04/09/22 1614  TROPONINIHS 34* 27* 243*     Chemistry Recent Labs  Lab 04/08/22 0224 04/08/22 1606 04/09/22 0413  NA 136  --  136  K 3.3*  --  4.2  CL 102  --  103  CO2 24  --  18*  GLUCOSE 165*  --  122*  BUN 28*  --  31*  CREATININE 1.63* 1.71* 1.09*  CALCIUM 9.0  --  8.2*  MG 1.9  --   --   GFRNONAA 31* 29* 50*  ANIONGAP 10  --  15    Recent Labs  Lab 04/08/22 0224  PROT 5.6*  ALBUMIN 3.3*  AST 36  ALT 28  ALKPHOS 58  BILITOT 0.9   Lipids No results for input(s): "CHOL", "TRIG", "HDL", "LABVLDL", "LDLCALC", "CHOLHDL" in the last 168 hours.  Hematology Recent Labs  Lab 04/08/22 0224 04/08/22 0859 04/08/22 1606 04/09/22 0413  WBC 33.7*  --  36.7* 37.0*  RBC 3.55*  --  3.34* 3.26*  HGB 11.4* 11.4* 10.6* 10.5*  HCT 33.2* 33.7* 32.1* 30.1*  MCV 93.5  --  96.1 92.3  MCH 32.1  --  31.7 32.2  MCHC  34.3  --  33.0 34.9  RDW 14.2  --  14.9 15.0  PLT 146*  --  128* 112*   Thyroid  Recent Labs  Lab 04/08/22 0859  TSH 0.957    BNPNo results for input(s): "BNP", "PROBNP" in the last 168 hours.  DDimer  Recent Labs  Lab 04/08/22 0859  DDIMER 3.86*     Radiology/Studies:  ECHOCARDIOGRAM LIMITED  Result Date: 04/08/2022    ECHOCARDIOGRAM LIMITED REPORT   Patient Name:   ANIJAH FORREN Date of Exam: 04/08/2022 Medical Rec #:  [id number]     Height:       62.0 in Accession #:    [id number]    Weight:       175.0 lb Date of Birth:  09-03-1940     BSA:          1.806 m Patient Age:    54 years      BP:           115/52 mmHg Patient Gender: F             HR:           105 bpm. Exam Location:  Inpatient Procedure: Limited Echo, Cardiac Doppler and Color Doppler                          STAT ECHO Reported to: Dr Claudina Lick on 04/08/2022 3:54:00 PM. Indications:    Pulmonary embolus  History:        Patient has no prior history of Echocardiogram examinations.                 Arrythmias:LBBB; Risk Factors:Hypertension and Former Smoker.                 Sepsis. AKI. Elevated troponin.  Sonographer:    Clayton Lefort RDCS (AE) Referring Phys: GRACE E BOWSER IMPRESSIONS  1. Left ventricular ejection fraction, by estimation, is 45 to 50%. The left ventricle has mildly decreased function. The left ventricle has no regional wall motion abnormalities. There is moderate left ventricular hypertrophy. Left ventricular diastolic parameters are consistent with Grade II diastolic dysfunction (pseudonormalization).  2. Right ventricular systolic function is normal. The right ventricular size is normal. There is mildly elevated pulmonary artery systolic pressure.  3. The mitral valve is normal in structure. Mild mitral valve regurgitation. No evidence of mitral stenosis.  4. The tricuspid valve is abnormal. Tricuspid valve regurgitation is mild to moderate.  5. The aortic valve is tricuspid. Aortic valve  regurgitation  is not visualized. No aortic stenosis is present.  6. The inferior vena cava is dilated in size with >50% respiratory variability, suggesting right atrial pressure of 8 mmHg. Comparison(s): No prior Echocardiogram. FINDINGS  Left Ventricle: Left ventricular ejection fraction, by estimation, is 45 to 50%. The left ventricle has mildly decreased function. The left ventricle has no regional wall motion abnormalities. The left ventricular internal cavity size was normal in size. There is moderate left ventricular hypertrophy. Abnormal (paradoxical) septal motion, consistent with left bundle branch block. Left ventricular diastolic parameters are consistent with Grade II diastolic dysfunction (pseudonormalization). Right Ventricle: The right ventricular size is normal. No increase in right ventricular wall thickness. Right ventricular systolic function is normal. There is mildly elevated pulmonary artery systolic pressure. The tricuspid regurgitant velocity is 3.01  m/s, and with an assumed right atrial pressure of 8 mmHg, the estimated right ventricular systolic pressure is 99991111 mmHg. Left Atrium: Left atrial size was normal in size. Right Atrium: Right atrial size was normal in size. Pericardium: There is no evidence of pericardial effusion. Mitral Valve: The mitral valve is normal in structure. There is moderate thickening of the mitral valve leaflet(s). Normal mobility of the mitral valve leaflets. Mild mitral valve regurgitation. No evidence of mitral valve stenosis. MV peak gradient, 6.2  mmHg. The mean mitral valve gradient is 4.0 mmHg. Tricuspid Valve: The tricuspid valve is abnormal. Tricuspid valve regurgitation is mild to moderate. No evidence of tricuspid stenosis. Aortic Valve: The aortic valve is tricuspid. Aortic valve regurgitation is not visualized. No aortic stenosis is present. Aortic valve mean gradient measures 4.0 mmHg. Aortic valve peak gradient measures 8.1 mmHg. Aortic valve area, by VTI measures  1.61 cm. Pulmonic Valve: The pulmonic valve was not well visualized. Pulmonic valve regurgitation is not visualized. No evidence of pulmonic stenosis. Aorta: The aortic root and ascending aorta are structurally normal, with no evidence of dilitation. Venous: The inferior vena cava is dilated in size with greater than 50% respiratory variability, suggesting right atrial pressure of 8 mmHg. IAS/Shunts: No atrial level shunt detected by color flow Doppler. Additional Comments: Spectral Doppler performed. Color Doppler performed.  LEFT VENTRICLE PLAX 2D LVIDd:         4.20 cm   Diastology LVIDs:         3.10 cm   LV e' medial:    5.77 cm/s LV PW:         1.20 cm   LV E/e' medial:  21.7 LV IVS:        1.70 cm   LV e' lateral:   8.16 cm/s LVOT diam:     1.80 cm   LV E/e' lateral: 15.3 LV SV:         34 LV SV Index:   19 LVOT Area:     2.54 cm  IVC IVC diam: 2.20 cm LEFT ATRIUM         Index LA diam:    3.00 cm 1.66 cm/m  AORTIC VALVE AV Area (Vmax):    1.61 cm AV Area (Vmean):   1.71 cm AV Area (VTI):     1.61 cm AV Vmax:           142.00 cm/s AV Vmean:          90.500 cm/s AV VTI:            0.212 m AV Peak Grad:      8.1 mmHg AV Mean Grad:  4.0 mmHg LVOT Vmax:         89.60 cm/s LVOT Vmean:        60.800 cm/s LVOT VTI:          0.134 m LVOT/AV VTI ratio: 0.63  AORTA Ao Root diam: 2.90 cm Ao Asc diam:  2.80 cm MITRAL VALVE                TRICUSPID VALVE MV Area (PHT): 4.06 cm     TR Peak grad:   36.2 mmHg MV Area VTI:   1.42 cm     TR Vmax:        301.00 cm/s MV Peak grad:  6.2 mmHg MV Mean grad:  4.0 mmHg     SHUNTS MV Vmax:       1.24 m/s     Systemic VTI:  0.13 m MV Vmean:      92.5 cm/s    Systemic Diam: 1.80 cm MV Decel Time: 187 msec MV E velocity: 125.00 cm/s MV A velocity: 116.00 cm/s MV E/A ratio:  1.08 Vishnu Priya Mallipeddi Electronically signed by Winfield Rast Mallipeddi Signature Date/Time: 04/08/2022/5:21:33 PM    Final    VAS [place] LOWER EXTREMITY VENOUS (DVT)  Result Date: 04/08/2022  Lower  Venous DVT Study Patient Name:  ARIANE DITULLIO  Date of Exam:   04/08/2022 Medical Rec #: [id number]      Accession #:    [id number] Date of Birth: May 18, 1941      Patient Gender: F Patient Age:   37 years Exam [facility]  [facility] Procedure:      VAS [place] LOWER EXTREMITY VENOUS (DVT) Referring Phys: Madelyn Flavors --------------------------------------------------------------------------------  Indications: Elevated D-Dimer.  Risk Factors: History of bilateral vein stripping. Comparison Study: Prior negative right LEV done 10/14/20 Performing Technologist: Sherren Kerns RVS  Examination Guidelines: A complete evaluation includes B-mode imaging, spectral Doppler, color Doppler, and power Doppler as needed of all accessible portions of each vessel. Bilateral testing is considered an integral part of a complete examination. Limited examinations for reoccurring indications may be performed as noted. The reflux portion of the exam is performed with the patient in reverse Trendelenburg.  +---------+---------------+---------+-----------+----------+--------------+ RIGHT    CompressibilityPhasicitySpontaneityPropertiesThrombus Aging +---------+---------------+---------+-----------+----------+--------------+ CFV      Full           Yes      Yes                                 +---------+---------------+---------+-----------+----------+--------------+ SFJ      Full                                                        +---------+---------------+---------+-----------+----------+--------------+ FV Prox  Full                                                        +---------+---------------+---------+-----------+----------+--------------+ FV Mid   Full                                                        +---------+---------------+---------+-----------+----------+--------------+  FV DistalFull                                                         +---------+---------------+---------+-----------+----------+--------------+ PFV      Full                                                        +---------+---------------+---------+-----------+----------+--------------+ POP      Full           Yes      Yes                                 +---------+---------------+---------+-----------+----------+--------------+ PTV      Full                                                        +---------+---------------+---------+-----------+----------+--------------+ PERO     Full                                                        +---------+---------------+---------+-----------+----------+--------------+   +---------+---------------+---------+-----------+----------+--------------+ LEFT     CompressibilityPhasicitySpontaneityPropertiesThrombus Aging +---------+---------------+---------+-----------+----------+--------------+ CFV      Full           Yes      Yes                                 +---------+---------------+---------+-----------+----------+--------------+ SFJ      Full                                                        +---------+---------------+---------+-----------+----------+--------------+ FV Prox  Full                                                        +---------+---------------+---------+-----------+----------+--------------+ FV Mid   Full                                                        +---------+---------------+---------+-----------+----------+--------------+ FV DistalFull                                                        +---------+---------------+---------+-----------+----------+--------------+  PFV      Full                                                        +---------+---------------+---------+-----------+----------+--------------+ POP      Full           Yes      Yes                                  +---------+---------------+---------+-----------+----------+--------------+ PTV      Full                                                        +---------+---------------+---------+-----------+----------+--------------+ PERO     Full                                                        +---------+---------------+---------+-----------+----------+--------------+     Summary: BILATERAL: - No evidence of deep vein thrombosis seen in the lower extremities, bilaterally. -No evidence of popliteal cyst, bilaterally. RIGHT: interstitial edema noted throughout  LEFT: Interstitial edema noted throughout.  *See table(s) above for measurements and observations. Electronically signed by Monica Martinez MD on 04/08/2022 at 1:14:36 PM.    Final    CT ABDOMEN PELVIS WO CONTRAST  Result Date: 04/08/2022 CLINICAL DATA:  Urinary incontinence and frequency. EXAM: CT ABDOMEN AND PELVIS WITHOUT CONTRAST TECHNIQUE: Multidetector CT imaging of the abdomen and pelvis was performed following the standard protocol without IV contrast. RADIATION DOSE REDUCTION: This exam was performed according to the departmental dose-optimization program which includes automated exposure control, adjustment of the mA and/or kV according to patient size and/or use of iterative reconstruction technique. COMPARISON:  01/17/2013 FINDINGS: Lower chest: Very small bilateral pleural effusions and bibasilar atelectasis. The heart is normal in size. No pericardial effusion. Aortic and coronary artery calcifications are noted. Hepatobiliary: No hepatic lesions or intrahepatic biliary dilatation. Mild gallbladder distension but no CT findings to suggest acute cholecystitis. No common bile duct dilatation. Pancreas: Moderate atrophy but no mass or inflammation. Spleen: Normal size. No focal lesions. Adrenals/Urinary Tract: Adrenal glands and kidneys are grossly normal. No renal lesions or hydronephrosis. No obstructing ureteral calculi or bladder  calculi. There are moderate interstitial changes around the left kidney. Could not exclude the possibility of pyelonephritis. Recommend correlation with urinalysis. Stomach/Bowel: The stomach, duodenum, small bowel and colon are grossly normal. No acute inflammatory process, mass lesions or obstructive findings. Vascular/Lymphatic: Age related atherosclerotic calcification involving the aorta and branch vessels but no aneurysm. Small scattered mesenteric and retroperitoneal lymph nodes but no mass or overt adenopathy. Reproductive: The uterus is surgically absent. Both ovaries are still present. There is a simple appearing 3.8 cm cyst associated with the right ovary. Recommend follow-up US in 6-12 months. Note: This recommendation does not apply to premenarchal patients and to those with increased risk (genetic, family history, elevated tumor markers or other high-risk factors) of ovarian cancer. Reference: Bernette Redbird  2020 Feb; 17(2):248-254. The left ovary is normal. Other: No pelvic mass or adenopathy. No free pelvic fluid collections. No inguinal mass or adenopathy. No abdominal wall hernia or subcutaneous lesions. Musculoskeletal: Extensive postoperative changes involving the lumbar spine. Advanced degenerative disc disease at L5-S1. No acute bony findings or worrisome bone lesions. IMPRESSION: 1. Moderate interstitial changes and fluid around the left kidney could be due to pyelonephritis. Recommend correlation with urinalysis. 2. No obstructing ureteral calculi or bladder calculi. 3. 3.8 cm simple appearing right ovarian cyst. Recommend follow-up US in 6-12 months. 4. Very small bilateral pleural effusions and bibasilar atelectasis. 5. Age related atherosclerotic calcification involving the aorta and branch vessels. 6. Aortic atherosclerosis. Aortic Atherosclerosis (ICD10-I70.0). Electronically Signed   By: Marijo Sanes M.D.   On: 04/08/2022 11:56   CT Head Wo Contrast  Result Date: 04/08/2022 CLINICAL DATA:   Mental status change EXAM: CT HEAD WITHOUT CONTRAST TECHNIQUE: Contiguous axial images were obtained from the base of the skull through the vertex without intravenous contrast. RADIATION DOSE REDUCTION: This exam was performed according to the departmental dose-optimization program which includes automated exposure control, adjustment of the mA and/or kV according to patient size and/or use of iterative reconstruction technique. COMPARISON:  10/16/2011 CT head, correlation is also made with 04/04/2018 MRI head FINDINGS: Brain: No evidence of acute infarction, hemorrhage, mass, mass effect, or midline shift. No hydrocephalus or extra-axial fluid collection. Periventricular white matter changes, likely the sequela of chronic small vessel ischemic disease. Vascular: No hyperdense vessel. Atherosclerotic calcifications in the intracranial carotid and vertebral arteries. Skull: Normal. Negative for fracture or focal lesion. Sinuses/Orbits: No acute finding. Other: The mastoid air cells are well aerated. IMPRESSION: No acute intracranial process. Electronically Signed   By: Merilyn Baba M.D.   On: 04/08/2022 03:53   DG Chest Portable 1 View  Result Date: 04/08/2022 CLINICAL DATA:  Clinical concern for pneumonia. EXAM: PORTABLE CHEST 1 VIEW COMPARISON:  09/01/2014. FINDINGS: The heart size and mediastinal contours are within normal limits. There is atherosclerotic calcification of the aorta. No consolidation, effusion, or pneumothorax. No acute osseous abnormality. IMPRESSION: No active disease. Electronically Signed   By: Brett Fairy M.D.   On: 04/08/2022 02:32     Assessment and Plan:   #Non-ST elevation myocardial infarction, likely type I Initially presented for vague back pain, AMS and found to have E. coli bacteremia and pyelonephritis.  Developed acute onset chest pain with exertion that did not resolve with rest.  Appears that she has been having stable angina at home.  Repeat troponin elevation on  11/12 with significant increase from troponin evaluation on 11/11.  EKG with known LBBB with negative Sgarbossa criteria.  Meets diagnostic criteria for NSTEMI with characteristic chest pain and positive troponin with delta.  Acute plaque rupture possibly precipitated by sepsis.  Type II event is also possible given sepsis.  Does have significant risk factors including known atherosclerotic disease.  No previous history of intracranial bleeding, GI bleeding, GU bleeding. - Continue to trend troponin every 2 hours - Remain on telemetry - Give aspirin 325 mg x1 and 81 mg daily starting on 11/13 a.m. - Give Plavix 600 mg x1 and 75 mg daily starting on 11/13 a.m. - Anticoagulation with IV heparin per pharmacy - Hold antihypertensive therapy - Agree with metoprolol tartrate for ischemic chest pain though need to be cautious precipitating hypotension in the setting of her sepsis - Agree with p.o./IV opioids for cardiac chest pain - If BP will allow and  has recurrent chest pain, can trial Nitropatch - Please make n.p.o. at midnight for consideration of cardiac catheterization in the morning, timing will likely be dependent on spots to treatment for pyelonephritis and chest pain trend   Risk Assessment/Risk Scores:     TIMI Risk Score for Unstable Angina or Non-ST Elevation MI:   The patient's TIMI risk score is 4, which indicates a 20% risk of all cause mortality, new or recurrent myocardial infarction or need for urgent revascularization in the next 14 days.          For questions or updates, please contact [facility] Please consult [email or web address] for contact info under    Signed, Jeralene Huff, MD  04/09/2022 7:10 PM

## 2022-04-09 NOTE — Progress Notes (Addendum)
NAME:  Amber [name], MRN:  [id number], DOB:  26-May-1941, LOS: 1 ADMISSION DATE:  04/08/2022, CONSULTATION DATE: 04/08/2022 REFERRING MD: Dr. 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 rate 17, height 5\' 2"  (1.575 m), weight 80.1 kg, SpO2 97 %.        Intake/Output Summary (Last 24 hours) at 04/09/2022 0803 Last data filed at 04/09/2022 0700 Gross per 24 hour  Intake 5510.71 ml  Output 550 ml  Net 4960.71 ml   Filed Weights   04/08/22 0139 04/09/22 0500  Weight: 79.4 kg 80.1 kg    Examination: General: Elderly lady, does not appear to be in distress HENT: Moist oral mucosa Lungs: Clear breath sounds Cardiovascular: S1-S2 appreciated Abdomen: Soft, bowel sounds appreciated Extremities: No clubbing, no edema Neuro: Alert and oriented x3 GU:   Resolved Hospital Problem list     Assessment & Plan:  Pyelonephritis Sepsis E. coli in blood screen -Continue  ceftriaxone  Septic shock -Fluid resuscitated -Has remained off peripheral pressors  Chronic back pain Lumbar radiculopathy -On Neurontin and oxy long-term  Leukocytosis -Related to ongoing infection  Mild chronic anemia   Best Practice (right click and "Reselect all SmartList Selections" daily)   Diet/type: Regular consistency (see orders) DVT prophylaxis: SCD GI prophylaxis: PPI Lines: N/A Foley:  N/A Code Status:  full code Last date of multidisciplinary goals of care discussion [discussed with family at bedside 04/08/2022]  Labs   CBC: Recent Labs  Lab 04/08/22 0224 04/08/22 0859 04/08/22 1606 04/09/22 0413  WBC 33.7*  --  36.7* 37.0*  NEUTROABS 28.4*  --   --   --   HGB 11.4* 11.4* 10.6* 10.5*  HCT 33.2* 33.7* 32.1* 30.1*  MCV 93.5  --  96.1 92.3  PLT 146*  --  128* 112*    Basic Metabolic Panel: Recent Labs  Lab 04/08/22 0224 04/08/22 1606 04/09/22 0413  NA 136  --  136  K 3.3*  --  4.2  CL 102  --  103  CO2 24  --  18*  GLUCOSE 165*  --  122*  BUN 28*  --  31*  CREATININE 1.63* 1.71* 1.09*  CALCIUM 9.0  --  8.2*  MG 1.9  --   --    GFR: Estimated Creatinine Clearance: 38.3 mL/min (A) (by C-G formula based on SCr of 1.09 mg/dL (H)). Recent Labs  Lab 04/08/22 0224 04/08/22 0413  04/08/22 0859 04/08/22 1308 04/08/22 1606 04/09/22 0413  WBC 33.7*  --   --   --  36.7* 37.0*  LATICACIDVEN 3.3* 2.6* 2.6* 2.8*  --   --     Liver Function Tests: Recent Labs  Lab 04/08/22 0224  AST 36  ALT 28  ALKPHOS 58  BILITOT 0.9  PROT 5.6*  ALBUMIN 3.3*   No results for input(s): "LIPASE", "AMYLASE" in the last 168 hours. No results for input(s): "AMMONIA" in the last 168 hours.  ABG    Component Value Date/Time   TCO2 29 04/04/2018 0853     Coagulation Profile: No results for input(s): "INR", "PROTIME" in the last 168 hours.  Cardiac Enzymes: No results for input(s): "CKTOTAL", "CKMB", "CKMBINDEX", "TROPONINI" in the last 168  hours.  HbA1C: Hgb A1c MFr Bld  Date/Time Value Ref Range Status  03/15/2021 11:39 AM 5.9 4.6 - 6.5 % Final    Comment:    Glycemic Control Guidelines for People with Diabetes:Non Diabetic:  <6%Goal of Therapy: <7%Additional Action Suggested:  >8%   03/17/2020 11:58 AM 5.8 (H) 4.8 - 5.6 % Final    Comment:             Prediabetes: 5.7 - 6.4          Diabetes: >6.4          Glycemic control for adults with diabetes: <7.0     CBG: No results for input(s): "GLUCAP" in the last 168 hours.  Review of Systems:   Offers no complaints this morning  Past Medical History:  She,  has a past medical history of Arthritis, DVT (deep venous thrombosis) ([facility]), Family history of blood clots, blood clots, Hypertension, Hypothyroidism, Left bundle branch block, Macular degeneration, Peripheral vascular disease ([facility]), Post-phlebitic syndrome (04/18/2013), Rotator cuff rupture, complete (07/05/2011), and Varicose veins.   Surgical History:   Past Surgical History:  Procedure Laterality Date   APPENDECTOMY  1979   BACK SURGERY  2009   ENDOVENOUS ABLATION SAPHENOUS VEIN W/ LASER Right 09/15/2013   right greater saphenous vein, by Gretta Began MD   ENDOVENOUS ABLATION SAPHENOUS VEIN W/ LASER Left 10/16/2013   left greater saphenous vein, by Gretta Began MD   left open rotator cuff surgery   2010    lumbar infusion  01/16/2008   PARTIAL HYSTERECTOMY  1979   SHOULDER OPEN ROTATOR CUFF REPAIR  07/05/2011   Procedure: ROTATOR CUFF REPAIR SHOULDER OPEN;  Surgeon: Jacki Cones, MD;  Location: [facility];  Service: Orthopedics;  Laterality: Right;   TOTAL KNEE ARTHROPLASTY Right 09/09/2014   Procedure: TOTAL RIGHT  KNEE ARTHROPLASTY;  Surgeon: Ranee Gosselin, MD;  Location: [facility];  Service: Orthopedics;  Laterality: Right;     Social History:   reports that she quit smoking about 57 years ago. Her smoking use included cigarettes. She has a 3.25 pack-year smoking history. She has never used smokeless tobacco.  She reports that she does not drink alcohol and does not use drugs.   Family History:  Her family history includes AAA (abdominal aortic aneurysm) in her brother and father; Cancer in her mother and sister; Deep vein thrombosis in her mother; Depression in her mother; Diabetes in her brother, mother, sister, sister, and sister; Heart disease in her father, mother, and sister; Heart disease (age of onset: 50) in her sister; Hypertension in her mother, sister, and sister; Obesity in her mother; Peripheral vascular disease in her brother; Varicose Veins in her mother and sister; Vision loss  in her brother, sister, sister, and sister.   Allergies Allergies  Allergen Reactions   Codeine Nausea And Vomiting   Darvocet [Propoxyphene N-Acetaminophen] Nausea And Vomiting   Vicodin [Hydrocodone-Acetaminophen] Other (See Comments)    Unknown reactions   Nickel Rash   Sulfa Antibiotics Rash    Like a sunburn    The patient is critically ill with multiple organ systems failure and requires high complexity decision making for assessment and support, frequent evaluation and titration of therapies, application of advanced monitoring technologies and extensive interpretation of multiple databases. Critical Care Time devoted to patient care services described in this note independent of APP/resident time (if applicable)  is 30 minutes.   Sherrilyn Rist MD [facility] Personal pager: See Amion If unanswered, please page CCM On-call: [phone]

## 2022-04-09 NOTE — Progress Notes (Signed)
Pt arrived to the unit from the ED. Pt's complaining of 10/10 chest pressure that she says just started shortly after arriving to the unit. EKG done, only prn available at this time is Tylenol, dose given. Dr. Wynona Neat made aware. See new orders.

## 2022-04-09 NOTE — Progress Notes (Signed)
ANTICOAGULATION CONSULT NOTE - Initial Consult  Pharmacy Consult for heparin Indication: chest pain/ACS  Allergies  Allergen Reactions   Codeine Nausea And Vomiting   Darvocet [Propoxyphene N-Acetaminophen] Nausea And Vomiting   Vicodin [Hydrocodone-Acetaminophen] Other (See Comments)    Unknown reactions   Nickel Rash   Sulfa Antibiotics Rash    Like a sunburn    Patient Measurements: Height: 5\' 2"  (157.5 cm) Weight: 80.1 kg (176 lb 9.4 oz) IBW/kg (Calculated) : 50.1 Heparin Dosing Weight: 68kg   Vital Signs: Temp: 97.7 F (36.5 C) (11/12 1757) Temp Source: Oral (11/12 1757) BP: 162/85 (11/12 1757) Pulse Rate: 98 (11/12 1757)  Labs: Recent Labs    04/08/22 0224 04/08/22 0413 04/08/22 0859 04/08/22 1606 04/09/22 0413 04/09/22 1614  HGB 11.4*  --  11.4* 10.6* 10.5*  --   HCT 33.2*  --  33.7* 32.1* 30.1*  --   PLT 146*  --   --  128* 112*  --   CREATININE 1.63*  --   --  1.71* 1.09*  --   TROPONINIHS 34* 27*  --   --   --  243*    Estimated Creatinine Clearance: 38.3 mL/min (A) (by C-G formula based on SCr of 1.09 mg/dL (H)).   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   04/07/2022 at 2300   atorvastatin (LIPITOR) 20 MG tablet TAKE 1 TABLET AT BEDTIME (Patient taking differently: Take 20 mg by mouth daily.) 90 tablet 3 04/06/2022   B Complex Vitamins (VITAMIN-B COMPLEX) TABS Take 1 tablet by mouth daily.    04/07/2022   Cholecalciferol (VITAMIN  D3) 1000 units CAPS Take 1,000 Units by mouth daily.    04/07/2022   clobetasol ointment (TEMOVATE) 0.05 % Apply 1 Application topically daily as needed (for eczema).   Past Week   cyclobenzaprine (FLEXERIL) 10 MG tablet Take 1 tablet (10 mg total) by mouth 3 (three) times daily as needed for muscle spasms. 30 tablet 0 04/07/2022 at 2300   gabapentin (NEURONTIN) 100 MG capsule TAKE 1 CAPSULE(100 MG) BY MOUTH THREE TIMES DAILY (Patient taking differently: Take 100 mg by mouth 3 (three) times daily.) 90 capsule 1 04/07/2022 at 2300   levothyroxine (SYNTHROID) 50 MCG tablet Take 1 tablet (50 mcg total) by mouth daily before breakfast. 90 tablet 3 04/07/2022   losartan-hydrochlorothiazide (HYZAAR) 50-12.5 MG tablet TAKE 1 TABLET DAILY 90 tablet 3 04/06/2022   Multiple Vitamins-Minerals (PRESERVISION AREDS PO) Take 1 tablet by mouth 2 (two) times daily.    04/07/2022   nystatin (MYCOSTATIN/NYSTOP) powder Apply 1 Application topically daily as needed (for rash/redness).   Past Week   oxyCODONE (OXY IR/ROXICODONE) 5 MG immediate release tablet Take 1 tablet (5 mg total) by mouth 2 (two) times daily as needed for severe pain. 15 tablet 0 04/07/2022 at 2300   pantoprazole (PROTONIX) 40 MG tablet TAKE 1 TABLET(40 MG) BY MOUTH DAILY (  Patient taking differently: Take 40 mg by mouth daily.) 90 tablet 3 04/07/2022   polyvinyl alcohol (LIQUIFILM TEARS) 1.4 % ophthalmic solution Place 1 drop into both eyes daily as needed for dry eyes.   Past Week   Scheduled:   [START ON 04/10/2022] aspirin EC  81 mg Oral Daily   aspirin  325 mg Oral Once   atorvastatin  20 mg Oral Daily   Chlorhexidine Gluconate Cloth  6 each Topical Q0600   gabapentin  100 mg Oral TID   heparin  2,000 Units Intravenous Once   levothyroxine  50 mcg Oral Q0600   metoprolol tartrate  25 mg Oral BID   oxyCODONE  5 mg Oral Q6H   pantoprazole  40 mg Oral Daily   sodium chloride flush  3 mL Intravenous Q12H   Infusions:   sodium chloride 20  mL/hr at 04/08/22 2000   sodium chloride 100 mL/hr at 04/09/22 1159   cefTRIAXone (ROCEPHIN)  IV 200 mL/hr at 04/09/22 0700   heparin      Assessment: Pt was admitted for weakness. She has a hx of DVT but not on AC. She is being treated for UTI and e.coli bacteremia. She c/o of CP this PM with elevated troponin. Heparin ordered to r/o MI.   Hgb 10.5 Plt 112 Scr 1.09  Goal of Therapy:  Heparin level 0.3-0.7 units/ml Monitor platelets by anticoagulation protocol: Yes   Plan:  Dc lovenox Heparin 2000 units x1 Heparin 1000 units/hr Check 8 hr HL then daily  Ulyses Southward, PharmD, [place], AAHIVP, CPP Infectious Disease Pharmacist 04/09/2022 6:12 PM

## 2022-04-09 NOTE — Evaluation (Signed)
Physical Therapy Evaluation Patient Details Name: Amber [name] MRN: [id number] DOB: 1940-10-10 Today's Date: 04/09/2022  History of Present Illness  Pt is a 81 y/o female presenting on 11/11 with AMS. Recently treated for UTI. Admitted with sepsis. CT abdomen with concern for pyleonephritis around L kidney. PMH includes: arthritis, DVT, HTN, macular degeneration, PVD,bil shoulder surgery, TKR, chronic back pain  Clinical Impression  Pt presents to PT with weakness, decr balance, and poor functional activity tolerance.  Lives alone and at this level is unable to care for herself there. Recommend SNF for further rehab at DC. Pt and family in agreement.      Recommendations for follow up therapy are one component of a multi-disciplinary discharge planning process, led by the attending physician.  Recommendations may be updated based on patient status, additional functional criteria and insurance authorization.  Follow Up Recommendations Skilled nursing-short term rehab (<3 hours/day) Can patient physically be transported by private vehicle: Yes    Assistance Recommended at Discharge Frequent or constant Supervision/Assistance  Patient can return home with the following  A little help with walking and/or transfers;A little help with bathing/dressing/bathroom;Assistance with cooking/housework;Assist for transportation;Help with stairs or ramp for entrance    Equipment Recommendations None recommended by PT  Recommendations for Other Services       Functional Status Assessment Patient has had a recent decline in their functional status and demonstrates the ability to make significant improvements in function in a reasonable and predictable amount of time.     Precautions / Restrictions Precautions Precautions: Fall Restrictions Weight Bearing Restrictions: No      Mobility  Bed Mobility Overal bed mobility: Needs Assistance Bed Mobility: Supine to Sit     Supine to sit: Min  assist     General bed mobility comments: Assist to elevate trunk into sitting and bring hips to EOB.    Transfers Overall transfer level: Needs assistance Equipment used: Rolling walker (2 wheels) Transfers: Sit to/from Stand Sit to Stand: Min assist           General transfer comment: Assist to bring hips up and to steady    Ambulation/Gait Ambulation/Gait assistance: Min assist Gait Distance (Feet): 10 Feet (x 2) Assistive device: Rolling walker (2 wheels) Gait Pattern/deviations: Step-through pattern, Decreased step length - right, Decreased step length - left, Shuffle, Trunk flexed Gait velocity: decr Gait velocity interpretation: <1.31 ft/sec, indicative of household ambulator   General Gait Details: Assist for balance and support. Pt fatigues quickly  Stairs            Wheelchair Mobility    Modified Rankin (Stroke Patients Only)       Balance Overall balance assessment: Needs assistance Sitting-balance support: No upper extremity supported, Feet supported Sitting balance-Leahy Scale: Fair     Standing balance support: Bilateral upper extremity supported, During functional activity Standing balance-Leahy Scale: Poor Standing balance comment: walker and min guard for static standing                             Pertinent Vitals/Pain Pain Assessment Pain Assessment: 0-10 Pain Score: 7  Pain Location: abdomen Pain Descriptors / Indicators: Tightness Pain Intervention(s): Limited activity within patient's tolerance, Monitored during session, Repositioned    Home Living Family/patient expects to be discharged to:: Private residence Living Arrangements: Alone Available Help at Discharge: Family;Available PRN/intermittently Type of Home: House Home Access: Stairs to enter Entrance Stairs-Rails: Right;Left Entrance Stairs-Number of Steps: 2  Home Layout: Two level;Able to live on main level with bedroom/bathroom Home Equipment:  Rolling Walker (2 wheels);Shower seat;Cane - single point;BSC/3in1      Prior Function Prior Level of Function : Needs assist             Mobility Comments: Uses cane and rolling walker ADLs Comments: Performs ADLS. Family gets groceries     Hand Dominance   Dominant Hand: Right    Extremity/Trunk Assessment   Upper Extremity Assessment Upper Extremity Assessment: Defer to OT evaluation    Lower Extremity Assessment Lower Extremity Assessment: Generalized weakness       Communication   Communication: No difficulties  Cognition Arousal/Alertness: Awake/alert Behavior During Therapy: WFL for tasks assessed/performed Overall Cognitive Status: Within Functional Limits for tasks assessed                                          General Comments General comments (skin integrity, edema, etc.): HR to 130's with amb    Exercises     Assessment/Plan    PT Assessment Patient needs continued PT services  PT Problem List Decreased strength;Decreased balance;Decreased mobility       PT Treatment Interventions DME instruction;Gait training;Functional mobility training;Therapeutic activities;Therapeutic exercise;Balance training;Patient/family education    PT Goals (Current goals can be found in the Care Plan section)  Acute Rehab PT Goals Patient Stated Goal: get stronger PT Goal Formulation: With patient/family Time For Goal Achievement: 04/23/22 Potential to Achieve Goals: Good    Frequency Min 2X/week     Co-evaluation PT/OT/SLP Co-Evaluation/Treatment: Yes Reason for Co-Treatment: For patient/therapist safety PT goals addressed during session: Mobility/safety with mobility;Proper use of DME;Balance         AM-PAC PT "6 Clicks" Mobility  Outcome Measure Help needed turning from your back to your side while in a flat bed without using bedrails?: A Little Help needed moving from lying on your back to sitting on the side of a flat bed  without using bedrails?: A Little Help needed moving to and from a bed to a chair (including a wheelchair)?: A Little Help needed standing up from a chair using your arms (e.g., wheelchair or bedside chair)?: A Little Help needed to walk in hospital room?: Total Help needed climbing 3-5 steps with a railing? : Total 6 Click Score: 14    End of Session Equipment Utilized During Treatment: Gait belt Activity Tolerance: Patient limited by fatigue Patient left: in chair;with call bell/phone within reach;with chair alarm set;with family/visitor present Nurse Communication: Mobility status PT Visit Diagnosis: Unsteadiness on feet (R26.81);Other abnormalities of gait and mobility (R26.89);Repeated falls (R29.6);Muscle weakness (generalized) (M62.81)    Time: 9983-3825 PT Time Calculation (min) (ACUTE ONLY): 37 min   Charges:   PT Evaluation $PT Eval Moderate Complexity: 1 Mod          [facility] PT Acute Rehabilitation Services Office [phone]   Angelina Ok [facility] 04/09/2022, 10:38 AM

## 2022-04-09 NOTE — Evaluation (Signed)
Occupational Therapy Evaluation Patient Details Name: Amber [name] MRN: [id number] DOB: Sep 01, 1940 Today's Date: 04/09/2022   History of Present Illness Pt is a 81 y/o female presenting on 11/11 with AMS. Recently treated for UTI. Admitted with sepsis. CT abdomen with concern for pyleonephritis around L kidney. PMH includes: arthritis, DVT, HTN, macular degeneration, PVD,bil shoulder surgery, TKR, chronic back pain   Clinical Impression   PTA patient independent with ADLs and using RW vs cane for mobility. Admitted for above and presents with problem list below, including decreased activity tolerance, impaired balance, generalized weakness.  She currently requires min to max assist for ADLs, min assist for transfers and limited in room mobility using RW.  She fatigues easily and HR elevates to 130s with activity.  Believe she will best benefit from further OT services acutely and after dc at SNF level to optimize independence, safety and with ADLs/mobility.      Recommendations for follow up therapy are one component of a multi-disciplinary discharge planning process, led by the attending physician.  Recommendations may be updated based on patient status, additional functional criteria and insurance authorization.   Follow Up Recommendations  Skilled nursing-short term rehab (<3 hours/day)    Assistance Recommended at Discharge Frequent or constant Supervision/Assistance  Patient can return home with the following A little help with walking and/or transfers;A lot of help with bathing/dressing/bathroom;Assistance with cooking/housework;Help with stairs or ramp for entrance;Assist for transportation    Functional Status Assessment  Patient has had a recent decline in their functional status and demonstrates the ability to make significant improvements in function in a reasonable and predictable amount of time.  Equipment Recommendations  Other (comment) (defer)    Recommendations for Other  Services       Precautions / Restrictions Precautions Precautions: Fall Restrictions Weight Bearing Restrictions: No      Mobility Bed Mobility Overal bed mobility: Needs Assistance Bed Mobility: Supine to Sit     Supine to sit: Min assist     General bed mobility comments: Assist to elevate trunk into sitting and bring hips to EOB.    Transfers Overall transfer level: Needs assistance Equipment used: Rolling walker (2 wheels) Transfers: Sit to/from Stand Sit to Stand: Min assist           General transfer comment: Assist to bring hips up and to steady      Balance Overall balance assessment: Needs assistance Sitting-balance support: No upper extremity supported, Feet supported Sitting balance-Leahy Scale: Fair     Standing balance support: Bilateral upper extremity supported, During functional activity Standing balance-Leahy Scale: Poor Standing balance comment: walker and min guard for static standing                           ADL either performed or assessed with clinical judgement   ADL Overall ADL's : Needs assistance/impaired     Grooming: Set up;Sitting           Upper Body Dressing : Minimal assistance;Sitting   Lower Body Dressing: Maximal assistance;+2 for safety/equipment;Sit to/from stand   Toilet Transfer: Minimal assistance;+2 for safety/equipment;Ambulation;BSC/3in1;Rolling walker (2 wheels);Regular Toilet   Toileting- Clothing Manipulation and Hygiene: Moderate assistance;Cueing for safety       Functional mobility during ADLs: Minimal assistance;+2 for safety/equipment       Vision   Vision Assessment?: No apparent visual deficits     Perception     Praxis      Pertinent Vitals/Pain  Pain Assessment Pain Assessment: 0-10 Pain Score: 7  Pain Location: abdomen Pain Descriptors / Indicators: Tightness Pain Intervention(s): Limited activity within patient's tolerance, Monitored during session, Repositioned      Hand Dominance Right   Extremity/Trunk Assessment Upper Extremity Assessment Upper Extremity Assessment: Generalized weakness (hx of Bil shoulder sx, limited flexion R shoulder)   Lower Extremity Assessment Lower Extremity Assessment: Defer to PT evaluation       Communication Communication Communication: No difficulties   Cognition Arousal/Alertness: Awake/alert Behavior During Therapy: WFL for tasks assessed/performed Overall Cognitive Status: Within Functional Limits for tasks assessed                                       General Comments  HR to 130s with activity    Exercises     Shoulder Instructions      Home Living Family/patient expects to be discharged to:: Private residence Living Arrangements: Alone Available Help at Discharge: Family;Available PRN/intermittently Type of Home: House Home Access: Stairs to enter [employer] of Steps: 2 Entrance Stairs-Rails: Right;Left Home Layout: Two level;Able to live on main level with bedroom/bathroom     Bathroom Shower/Tub: Arts development officer Toilet: Handicapped height     Home Equipment: Agricultural consultant (2 wheels);Shower seat;Cane - single point;BSC/3in1          Prior Functioning/Environment Prior Level of Function : Needs assist             Mobility Comments: Uses cane and rolling walker ADLs Comments: Performs ADLS. Family gets groceries        OT Problem List: Decreased strength;Decreased activity tolerance;Impaired balance (sitting and/or standing);Decreased knowledge of use of DME or AE;Decreased knowledge of precautions;Obesity      OT Treatment/Interventions: Self-care/ADL training;Therapeutic exercise;DME and/or AE instruction;Therapeutic activities;Patient/family education;Balance training    OT Goals(Current goals can be found in the care plan section) Acute Rehab OT Goals Patient Stated Goal: get stronger OT Goal Formulation: With patient Time  For Goal Achievement: 04/23/22 Potential to Achieve Goals: Good  OT Frequency: Min 2X/week    Co-evaluation PT/OT/SLP Co-Evaluation/Treatment: Yes Reason for Co-Treatment: For patient/therapist safety;To address functional/ADL transfers PT goals addressed during session: Mobility/safety with mobility;Proper use of DME;Balance OT goals addressed during session: ADL's and self-care      AM-PAC OT "6 Clicks" Daily Activity     Outcome Measure Help from another person eating meals?: None Help from another person taking care of personal grooming?: A Little Help from another person toileting, which includes using toliet, bedpan, or urinal?: A Lot Help from another person bathing (including washing, rinsing, drying)?: A Lot Help from another person to put on and taking off regular upper body clothing?: A Little Help from another person to put on and taking off regular lower body clothing?: A Lot 6 Click Score: 16   End of Session Equipment Utilized During Treatment: Gait belt;Rolling walker (2 wheels) Nurse Communication: Mobility status  Activity Tolerance: Patient tolerated treatment well Patient left: in chair;with call bell/phone within reach;with chair alarm set;with family/visitor present  OT Visit Diagnosis: Unsteadiness on feet (R26.81);Other abnormalities of gait and mobility (R26.89);Muscle weakness (generalized) (M62.81)                Time: 8280-0349 OT Time Calculation (min): 35 min Charges:  OT General Charges $OT Visit: 1 Visit OT Evaluation $OT Eval Moderate Complexity: 1 Mod  Rylynn Schoneman B, OT Acute  Rehabilitation Services Office [phone]   Chancy Milroy 04/09/2022, 11:41 AM

## 2022-04-10 ENCOUNTER — Inpatient Hospital Stay (HOSPITAL_COMMUNITY): Payer: Medicare Other

## 2022-04-10 ENCOUNTER — Encounter (HOSPITAL_COMMUNITY)
Admission: EM | Disposition: A | Discharge status: Skilled Nursing Facility | Payer: Self-pay | Source: Home / Self Care | Attending: Internal Medicine

## 2022-04-10 DIAGNOSIS — I2510 Atherosclerotic heart disease of native coronary artery without angina pectoris: Secondary | ICD-10-CM | POA: Diagnosis not present

## 2022-04-10 DIAGNOSIS — R7881 Bacteremia: Secondary | ICD-10-CM | POA: Diagnosis not present

## 2022-04-10 DIAGNOSIS — R079 Chest pain, unspecified: Secondary | ICD-10-CM

## 2022-04-10 DIAGNOSIS — I214 Non-ST elevation (NSTEMI) myocardial infarction: Secondary | ICD-10-CM

## 2022-04-10 HISTORY — PX: LEFT HEART CATH AND CORONARY ANGIOGRAPHY: CATH118249

## 2022-04-10 LAB — CBC
HCT: 30.1 % — ABNORMAL LOW (ref 36.0–46.0)
Hemoglobin: 10.6 g/dL — ABNORMAL LOW (ref 12.0–15.0)
MCH: 32.4 pg (ref 26.0–34.0)
MCHC: 35.2 g/dL (ref 30.0–36.0)
MCV: 92 fL (ref 80.0–100.0)
Platelets: 106 10*3/uL — ABNORMAL LOW (ref 150–400)
RBC: 3.27 MIL/uL — ABNORMAL LOW (ref 3.87–5.11)
RDW: 14.9 % (ref 11.5–15.5)
WBC: 35.2 10*3/uL — ABNORMAL HIGH (ref 4.0–10.5)
nRBC: 0 % (ref 0.0–0.2)

## 2022-04-10 LAB — CULTURE, BLOOD (ROUTINE X 2): Special Requests: ADEQUATE

## 2022-04-10 LAB — URINE CULTURE: Culture: >=100000 — AB

## 2022-04-10 LAB — HEPARIN LEVEL (UNFRACTIONATED)
Heparin Unfractionated: 0.42 IU/mL (ref 0.30–0.70)
Heparin Unfractionated: 0.28 IU/mL — ABNORMAL LOW (ref 0.30–0.70)

## 2022-04-10 LAB — TROPONIN I (HIGH SENSITIVITY): Troponin I (High Sensitivity): 192 ng/L (ref <18)

## 2022-04-10 SURGERY — LEFT HEART CATH AND CORONARY ANGIOGRAPHY
Anesthesia: LOCAL

## 2022-04-10 MED ORDER — VERAPAMIL HCL 2.5 MG/ML IV SOLN
INTRAVENOUS | Status: AC
  Filled 2022-04-10: qty 2

## 2022-04-10 MED ORDER — ONDANSETRON HCL 4 MG/2ML IJ SOLN: 4.0000 mg | Freq: Four times a day (QID) | INTRAMUSCULAR | Status: DC | PRN

## 2022-04-10 MED ORDER — ASPIRIN 81 MG PO CHEW: 81.0000 mg | CHEWABLE_TABLET | ORAL | Status: DC

## 2022-04-10 MED ORDER — FUROSEMIDE 10 MG/ML IJ SOLN
20.0000 mg | Freq: Once | INTRAMUSCULAR | Status: AC
  Administered 2022-04-10: 20 mg via INTRAVENOUS

## 2022-04-10 MED ORDER — HEPARIN SODIUM (PORCINE) 1000 UNIT/ML IJ SOLN
INTRAMUSCULAR | Status: AC
  Filled 2022-04-10: qty 10

## 2022-04-10 MED ORDER — FENTANYL CITRATE (PF) 100 MCG/2ML IJ SOLN
INTRAMUSCULAR | Status: DC | PRN
  Administered 2022-04-10: 25 ug via INTRAVENOUS

## 2022-04-10 MED ORDER — SODIUM CHLORIDE 0.9 % IV SOLN
INTRAVENOUS | Status: AC | PRN
  Administered 2022-04-10: 10 mL/h via INTRAVENOUS

## 2022-04-10 MED ORDER — ENOXAPARIN SODIUM 40 MG/0.4ML IJ SOSY
40.0000 mg | PREFILLED_SYRINGE | INTRAMUSCULAR | Status: DC
  Administered 2022-04-11 – 2022-04-15 (×5): 40 mg via SUBCUTANEOUS
  Filled 2022-04-10 (×5): qty 0.4

## 2022-04-10 MED ORDER — ACETAMINOPHEN 325 MG PO TABS: 650.0000 mg | ORAL_TABLET | ORAL | Status: DC | PRN

## 2022-04-10 MED ORDER — LIDOCAINE HCL (PF) 1 % IJ SOLN
INTRAMUSCULAR | Status: DC | PRN
  Administered 2022-04-10: 2 mL

## 2022-04-10 MED ORDER — SODIUM CHLORIDE 0.9 % IV SOLN: 250.0000 mL | INTRAVENOUS | Status: DC | PRN

## 2022-04-10 MED ORDER — SODIUM CHLORIDE 0.9 % IV SOLN: INTRAVENOUS | Status: AC

## 2022-04-10 MED ORDER — SODIUM CHLORIDE 0.9% FLUSH: 3.0000 mL | INTRAVENOUS | Status: DC | PRN

## 2022-04-10 MED ORDER — LABETALOL HCL 5 MG/ML IV SOLN: 10.0000 mg | INTRAVENOUS | Status: AC | PRN

## 2022-04-10 MED ORDER — FENTANYL CITRATE (PF) 100 MCG/2ML IJ SOLN
INTRAMUSCULAR | Status: AC
  Filled 2022-04-10: qty 2

## 2022-04-10 MED ORDER — VERAPAMIL HCL 2.5 MG/ML IV SOLN
INTRAVENOUS | Status: DC | PRN
  Administered 2022-04-10: 10 mL via INTRA_ARTERIAL

## 2022-04-10 MED ORDER — SODIUM CHLORIDE 0.9% FLUSH
3.0000 mL | Freq: Two times a day (BID) | INTRAVENOUS | Status: DC
  Administered 2022-04-10 – 2022-04-12 (×4): 3 mL via INTRAVENOUS

## 2022-04-10 MED ORDER — HEPARIN (PORCINE) IN NACL 1000-0.9 UT/500ML-% IV SOLN
INTRAVENOUS | Status: AC
  Filled 2022-04-10: qty 1000

## 2022-04-10 MED ORDER — IOHEXOL 350 MG/ML SOLN
INTRAVENOUS | Status: DC | PRN
  Administered 2022-04-10: 30 mL

## 2022-04-10 MED ORDER — HEPARIN (PORCINE) IN NACL 1000-0.9 UT/500ML-% IV SOLN
INTRAVENOUS | Status: DC | PRN
  Administered 2022-04-10 (×2): 500 mL

## 2022-04-10 MED ORDER — SODIUM CHLORIDE 0.9% FLUSH
3.0000 mL | Freq: Two times a day (BID) | INTRAVENOUS | Status: DC
  Administered 2022-04-10 – 2022-04-15 (×8): 3 mL via INTRAVENOUS

## 2022-04-10 MED ORDER — ASPIRIN 81 MG PO CHEW
81.0000 mg | CHEWABLE_TABLET | Freq: Every day | ORAL | Status: DC
  Administered 2022-04-11: 81 mg via ORAL
  Filled 2022-04-10: qty 1

## 2022-04-10 MED ORDER — SODIUM CHLORIDE 0.9 % IV SOLN: INTRAVENOUS | Status: DC

## 2022-04-10 MED ORDER — MIDAZOLAM HCL 2 MG/2ML IJ SOLN
INTRAMUSCULAR | Status: DC | PRN
  Administered 2022-04-10: 1 mg via INTRAVENOUS

## 2022-04-10 MED ORDER — MIDAZOLAM HCL 2 MG/2ML IJ SOLN
INTRAMUSCULAR | Status: AC
  Filled 2022-04-10: qty 2

## 2022-04-10 MED ORDER — HEPARIN SODIUM (PORCINE) 1000 UNIT/ML IJ SOLN
INTRAMUSCULAR | Status: DC | PRN
  Administered 2022-04-10: 4500 [IU] via INTRAVENOUS

## 2022-04-10 MED ORDER — LIDOCAINE HCL (PF) 1 % IJ SOLN
INTRAMUSCULAR | Status: AC
  Filled 2022-04-10: qty 30

## 2022-04-10 MED ORDER — HYDRALAZINE HCL 20 MG/ML IJ SOLN: 10.0000 mg | INTRAMUSCULAR | Status: AC | PRN

## 2022-04-10 MED ORDER — NITROGLYCERIN 0.4 MG SL SUBL
0.4000 mg | SUBLINGUAL_TABLET | SUBLINGUAL | Status: DC | PRN
  Administered 2022-04-10: 0.4 mg via SUBLINGUAL
  Filled 2022-04-10: qty 1

## 2022-04-10 MED ORDER — CEFAZOLIN SODIUM-DEXTROSE 2-4 GM/100ML-% IV SOLN
2.0000 g | Freq: Three times a day (TID) | INTRAVENOUS | Status: DC
  Administered 2022-04-11 – 2022-04-16 (×16): 2 g via INTRAVENOUS
  Filled 2022-04-10 (×16): qty 100

## 2022-04-10 MED ORDER — FUROSEMIDE 10 MG/ML IJ SOLN
INTRAMUSCULAR | Status: AC
  Filled 2022-04-10: qty 2

## 2022-04-10 SURGICAL SUPPLY — 10 items
CATH OPTITORQUE TIG 4.0 5F (CATHETERS) IMPLANT
DEVICE RAD COMP TR BAND LRG (VASCULAR PRODUCTS) IMPLANT
GLIDESHEATH SLEND SS 6F .021 (SHEATH) IMPLANT
GUIDEWIRE INQWIRE 1.5J.035X260 (WIRE) IMPLANT
INQWIRE 1.5J .035X260CM (WIRE) ×1
KIT HEART LEFT (KITS) ×1 IMPLANT
PACK CARDIAC CATHETERIZATION (CUSTOM PROCEDURE TRAY) ×1 IMPLANT
SHEATH PROBE COVER 6X72 (BAG) IMPLANT
TRANSDUCER W/STOPCOCK (MISCELLANEOUS) ×1 IMPLANT
TUBING CIL FLEX 10 FLL-RA (TUBING) ×1 IMPLANT

## 2022-04-10 NOTE — Progress Notes (Signed)
ANTICOAGULATION CONSULT NOTE - Follow Up Consult  Pharmacy Consult for heparin Indication:  NSTEMI  Labs: Recent Labs    04/08/22 0224 04/08/22 0413 04/08/22 0859 04/08/22 1606 04/09/22 0413 04/09/22 1614 04/09/22 1922 04/10/22 0052  HGB 11.4*  --    < > 10.6* 10.5*  --   --  10.6*  HCT 33.2*  --    < > 32.1* 30.1*  --   --  30.1*  PLT 146*  --   --  128* 112*  --   --  106*  HEPARINUNFRC  --   --   --   --   --   --   --  0.42  CREATININE 1.63*  --   --  1.71* 1.09*  --   --   --   TROPONINIHS 34* 27*  --   --   --  243* 308*  --    < > = values in this interval not displayed.    Assessment/Plan:  81yo female therapeutic on heparin with initial dosing for NSTEMI. Will continue infusion at current rate of 1000 units/hr and confirm stable with additional level.   Vernard Gambles, PharmD, BCPS  04/10/2022,2:39 AM

## 2022-04-10 NOTE — Progress Notes (Addendum)
PROGRESS NOTE  Amber [name]  DOB: 1940/08/14  PCP: Leamon Arnt, MD WNI:[id number]  DOA: 04/08/2022  LOS: 2 days  Hospital Day: 3  Brief narrative: Amber [name] is a 81 y.o. female with PMH significant for HTN, HLD, PAD, DVT, arthritis, hypothyroidism, varicose vein, rotator cuff rupture who lives alone at home, recently treated for UTI. 11/11, patient was brought to the ED from home with urinary incontinence, frequency, altered mental status. EMS noted a low blood pressure at 70/50 which improved with normal saline hydration.  In the ED, patient had a temperature of 100.3, tachycardia, tachypnea. Labs with WC count elevated to 33.7, creatinine 1.63, lactic acid elevated to 3.3 CT of the head and x-ray chest were unremarkable. Urinalysis showed large leukocytes, positive nitrites, many bacteria CT abdomen pelvis showed left pyelonephritis, no obstructing calculi. Patient was started on IV antibiotics, IV fluids Admitted to [facility] service for sepsis due to acute left pyelonephritis Within few hours of admission, patient's blood pressure started to drop again and hence she was transferred to ICU for septic shock. Urine culture and blood culture sent on admission grew E. coli. 11/13, transferred out to [facility].  Subjective: Patient was seen and examined this morning.  Pleasant elderly Caucasian female.  Propped up in bed.  Not in distress.  Sister at bedside. Not on supplemental oxygen. Chart reviewed.  In the last 24 hours, no fever, heart rate in 100s, blood pressure elevated up to 170s. Last set of labs from this morning with troponin 192, WBC count elevated at 35,000, hemoglobin 10.6 Chest x-ray this morning suggestive of pulmonary edema.  Assessment and plan: Septic shock - POA Acute left pyelonephritis E. coli UTI and bacteremia Patient met sepsis criteria on admission.  Within few hours, blood pressure dropped significantly and hence transferred to ICU for septic shock and  started on pressors.  Eventually pressors weaned down.  Currently blood pressure is elevated. Urine culture and blood culture sent on admission grew E. coli. Currently on IV Rocephin.  Discussed with pharmacy this morning.  Okay to switch to IV Ancef. WBC count still remains elevated.  Lactic acid level has not trended down either.  No fever though Continue to monitor labs. Recent Labs  Lab 04/08/22 0224 04/08/22 0413 04/08/22 0859 04/08/22 1308 04/08/22 1606 04/09/22 0413 04/09/22 1321 04/10/22 0052  WBC 33.7*  --   --   --  36.7* 37.0*  --  35.2*  LATICACIDVEN 3.3* 2.6* 2.6* 2.8*  --   --  2.8*  --    NSTEMI Patient reports several weeks of exertional angina and dyspnea 10/12, during ambulation, she had recurrence of symptoms.  Symptoms improved with morphine IV.  Troponin was elevated, peaked at 308.  Trend as below. Currently on IV heparin drip.  Cardiology following. Given aspirin, Plavix Tentative plan of cardiac cath today. Recent Labs    04/08/22 0224 04/08/22 0413 04/09/22 1614 04/09/22 1922 04/10/22 0052  TROPONINIHS 34* 27* 243* 308* 192*   Hypertension PTA on losartan 50 mg daily, HCTZ 12.5 mg daily BP meds were held because of septic shock.  Blood pressure improving and actually rising up to 160s.  Stop IV fluid today. Currently on metoprolol 25 mg twice daily started for NSTEMI.  Continue to monitor blood pressure.  History of PAD PTA on Lipitor 20 mg daily.  Not on blood thinners.  Currently on heparin drip   Hypothyroidism Synthroid  Chronic back pain Lumbar radiculopathy PTA on Neurontin 100 mg 3 times  daily, oxycodone 5 mg as needed, Flexeril 10 mg 3 times daily as needed Resume all   Mild chronic anemia Hemoglobin stable close to 10.  Continue to monitor Recent Labs    04/08/22 0224 04/08/22 0859 04/08/22 1606 04/09/22 0413 04/10/22 0052  HGB 11.4* 11.4* 10.6* 10.5* 10.6*  MCV 93.5  --  96.1 92.3 92.0     Goals of care   Code Status:  Full Code    Mobility: PT eval ordered.  Skin assessment:     Nutritional status:  Body mass index is 36.17 kg/m.          Diet:  Diet Order             Diet NPO time specified  Diet effective midnight                   DVT prophylaxis:  SCDs Start: 04/08/22 1447   Antimicrobials: IV Ancef Fluid: Currently on NS at 100 mill per hour.  I will stop it at this time. Consultants: Cardiology Family Communication: Sister at bedside  Status is: Inpatient  Continue in-hospital care because: Pending cardiac cath today.  Sepsis parameters are still not improved Level of care: Progressive   Dispo: The patient is from: Home              Anticipated d/c is to: Pending clinical course              Patient currently is not medically stable to d/c.   Difficult to place patient No     Infusions:   sodium chloride 20 mL/hr at 04/08/22 2000   [START ON 04/11/2022]  ceFAZolin (ANCEF) IV     heparin 1,100 Units/hr (04/10/22 1225)    Scheduled Meds:  aspirin EC  81 mg Oral Daily   atorvastatin  20 mg Oral Daily   Chlorhexidine Gluconate Cloth  6 each Topical Q0600   clopidogrel  75 mg Oral Daily   furosemide       gabapentin  100 mg Oral TID   levothyroxine  50 mcg Oral Q0600   metoprolol tartrate  25 mg Oral BID   oxyCODONE  5 mg Oral Q6H   pantoprazole  40 mg Oral Daily   sodium chloride flush  3 mL Intravenous Q12H   sodium chloride flush  3 mL Intravenous Q12H    PRN meds: acetaminophen **OR** acetaminophen, albuterol, clobetasol ointment, docusate sodium, furosemide, nitroGLYCERIN, nystatin, ondansetron **OR** ondansetron (ZOFRAN) IV, mouth rinse, polyethylene glycol, polyvinyl alcohol   Antimicrobials: Anti-infectives (From admission, onward)    Start     Dose/Rate Route Frequency Ordered Stop   04/11/22 0600  ceFAZolin (ANCEF) IVPB 2g/100 mL premix        2 g 200 mL/hr over 30 Minutes Intravenous Every 8 hours 04/10/22 1136     04/09/22 0600   cefTRIAXone (ROCEPHIN) 2 g in sodium chloride 0.9 % 100 mL IVPB  Status:  Discontinued        2 g 200 mL/hr over 30 Minutes Intravenous Every 24 hours 04/08/22 1204 04/10/22 1136   04/08/22 0630  amoxicillin-clavulanate (AUGMENTIN) 875-125 MG per tablet 1 tablet        1 tablet Oral  Once 04/08/22 0618 04/08/22 0636   04/08/22 0600  cefTRIAXone (ROCEPHIN) 2 g in sodium chloride 0.9 % 100 mL IVPB        2 g 200 mL/hr over 30 Minutes Intravenous  Once 04/08/22 0549 04/08/22 0858  Objective: Vitals:   04/10/22 0754 04/10/22 1229  BP: (!) 163/104 134/75  Pulse: (!) 107 84  Resp: (!) 21 20  Temp: 97.7 F (36.5 C) 98 F (36.7 C)  SpO2: 99% 98%    Intake/Output Summary (Last 24 hours) at 04/10/2022 1308 Last data filed at 04/10/2022 1225 Gross per 24 hour  Intake 764.04 ml  Output 750 ml  Net 14.04 ml   Filed Weights   04/08/22 0139 04/09/22 0500 04/10/22 0500  Weight: 79.4 kg 80.1 kg 89.7 kg   Weight change: 9.6 kg Body mass index is 36.17 kg/m.   Physical Exam: General exam: Pleasant, elderly female.  Not in physical distress Skin: No rashes, lesions or ulcers. HEENT: Atraumatic, normocephalic, no obvious bleeding Lungs: Clear to auscultation bilaterally CVS: Regular rate and rhythm, no murmur GI/Abd soft, nontender, nondistended, bowels are present CNS: Alert, awake, oriented x3 Psychiatry: Sad affect Extremities: No pedal edema, no calf tenderness  Data Review: I have personally reviewed the laboratory data and studies available.  F/u labs ordered Unresulted Labs (From admission, onward)     Start     Ordered   04/11/22 0500  Heparin level (unfractionated)  Daily at 5am,   R     Question:  Specimen collection method  Answer:  Lab=Lab collect   04/09/22 1807   04/11/22 0500  CBC  Daily at 5am,   R     Question:  Specimen collection method  Answer:  Lab=Lab collect   04/09/22 1807   04/11/22 8270  Basic metabolic panel  Daily at 5am,   R     Question:   Specimen collection method  Answer:  Lab=Lab collect   04/10/22 1040   04/11/22 0500  Lactic acid, plasma  Tomorrow morning,   R       Question:  Specimen collection method  Answer:  Lab=Lab collect   04/10/22 1040            Signed, Terrilee Croak, MD [facility] 04/10/2022

## 2022-04-10 NOTE — H&P (View-Only) (Signed)
Rounding Note    Patient Name: Amber [name] Date of Encounter: 04/10/2022  [place] Cardiologist: Kirk Ruths, MD   Subjective   Some recurrent chest pain in night none currently   Inpatient Medications    Scheduled Meds:  aspirin EC  81 mg Oral Daily   atorvastatin  20 mg Oral Daily   Chlorhexidine Gluconate Cloth  6 each Topical Q0600   clopidogrel  75 mg Oral Daily   furosemide       gabapentin  100 mg Oral TID   levothyroxine  50 mcg Oral Q0600   metoprolol tartrate  25 mg Oral BID   oxyCODONE  5 mg Oral Q6H   pantoprazole  40 mg Oral Daily   sodium chloride flush  3 mL Intravenous Q12H   Continuous Infusions:  sodium chloride 20 mL/hr at 04/08/22 2000   sodium chloride 100 mL/hr at 04/09/22 2114   cefTRIAXone (ROCEPHIN)  IV 2 g (04/10/22 0524)   heparin 1,000 Units/hr (04/09/22 1957)   PRN Meds: acetaminophen **OR** acetaminophen, albuterol, clobetasol ointment, docusate sodium, furosemide, nitroGLYCERIN, nystatin, ondansetron **OR** ondansetron (ZOFRAN) IV, mouth rinse, polyethylene glycol, polyvinyl alcohol   Vital Signs    Vitals:   04/10/22 0500 04/10/22 0549 04/10/22 0600 04/10/22 0754  BP:  (!) 186/119 (!) 175/119 (!) 163/104  Pulse:  (!) 118 (!) 117 (!) 107  Resp:  (!) 26 (!) 23 (!) 21  Temp:    97.7 F (36.5 C)  TempSrc:    Oral  SpO2:  95% 99% 99%  Weight: 89.7 kg     Height:        Intake/Output Summary (Last 24 hours) at 04/10/2022 1045 Last data filed at 04/10/2022 0846 Gross per 24 hour  Intake 13 ml  Output 1650 ml  Net -1637 ml      04/10/2022    5:00 AM 04/09/2022    5:00 AM 04/08/2022    1:39 AM  Last 3 Weights  Weight (lbs) 197 lb 12 oz 176 lb 9.4 oz 175 lb  Weight (kg) 89.7 kg 80.1 kg 79.379 kg      Telemetry    SR to ST up to 110 - Personally Reviewed  ECG    No new - Personally Reviewed  Physical Exam   GEN: No acute distress.   Neck: No JVD Cardiac: RRR, no murmurs, rubs, or gallops.   Respiratory: Clear to auscultation bilaterally. GI: Soft, nontender, non-distended  MS: No edema; No deformity. Neuro:  Nonfocal  Psych: Normal affect   Labs    High Sensitivity Troponin:   Recent Labs  Lab 04/08/22 0224 04/08/22 0413 04/09/22 1614 04/09/22 1922 04/10/22 0052  TROPONINIHS 34* 27* 243* 308* 192*     Chemistry Recent Labs  Lab 04/08/22 0224 04/08/22 1606 04/09/22 0413  NA 136  --  136  K 3.3*  --  4.2  CL 102  --  103  CO2 24  --  18*  GLUCOSE 165*  --  122*  BUN 28*  --  31*  CREATININE 1.63* 1.71* 1.09*  CALCIUM 9.0  --  8.2*  MG 1.9  --   --   PROT 5.6*  --   --   ALBUMIN 3.3*  --   --   AST 36  --   --   ALT 28  --   --   ALKPHOS 58  --   --   BILITOT 0.9  --   --  GFRNONAA 31* 29* 50*  ANIONGAP 10  --  15    Lipids No results for input(s): "CHOL", "TRIG", "HDL", "LABVLDL", "LDLCALC", "CHOLHDL" in the last 168 hours.  Hematology Recent Labs  Lab 04/08/22 1606 04/09/22 0413 04/10/22 0052  WBC 36.7* 37.0* 35.2*  RBC 3.34* 3.26* 3.27*  HGB 10.6* 10.5* 10.6*  HCT 32.1* 30.1* 30.1*  MCV 96.1 92.3 92.0  MCH 31.7 32.2 32.4  MCHC 33.0 34.9 35.2  RDW 14.9 15.0 14.9  PLT 128* 112* 106*   Thyroid  Recent Labs  Lab 04/08/22 0859  TSH 0.957    BNPNo results for input(s): "BNP", "PROBNP" in the last 168 hours.  DDimer  Recent Labs  Lab 04/08/22 0859  DDIMER 3.86*     Radiology    DG Chest Port 1 View  Result Date: 04/10/2022 CLINICAL DATA:  81 year old female with history of respiratory distress. EXAM: PORTABLE CHEST 1 VIEW COMPARISON:  Chest x-ray 03/08/2022. FINDINGS: There is cephalization of the pulmonary vasculature and slight indistinctness of the interstitial markings suggestive of mild pulmonary edema. Trace bilateral pleural effusions. No pneumothorax. Heart size appears normal. Upper mediastinal contours are within normal limits. Atherosclerotic calcifications are noted in the thoracic aorta. IMPRESSION: 1. The appearance  of the chest is suggestive of pulmonary edema, however, heart size is normal. Findings could reflect noncardiogenic edema, severe diastolic heart failure, or alternative etiology such as atypical infection. Further clinical evaluation is recommended. 2. Aortic atherosclerosis. Electronically Signed   By: Vinnie Langton M.D.   On: 04/10/2022 06:21   ECHOCARDIOGRAM LIMITED  Result Date: 04/08/2022    ECHOCARDIOGRAM LIMITED REPORT   Patient Name:   Amber [name] Date of Exam: 04/08/2022 Medical Rec #:  [id number]     Height:       62.0 in Accession #:    [id number]    Weight:       175.0 lb Date of Birth:  [birth year]     BSA:          1.806 m Patient Age:    44 years      BP:           115/52 mmHg Patient Gender: F             HR:           105 bpm. Exam Location:  Inpatient Procedure: Limited Echo, Cardiac Doppler and Color Doppler                          STAT ECHO Reported to: Dr Claudina Lick on 04/08/2022 3:54:00 PM. Indications:    Pulmonary embolus  History:        Patient has no prior history of Echocardiogram examinations.                 Arrythmias:LBBB; Risk Factors:Hypertension and Former Smoker.                 Sepsis. AKI. Elevated troponin.  Sonographer:    Clayton Lefort RDCS (AE) Referring Phys: GRACE E BOWSER IMPRESSIONS  1. Left ventricular ejection fraction, by estimation, is 45 to 50%. The left ventricle has mildly decreased function. The left ventricle has no regional wall motion abnormalities. There is moderate left ventricular hypertrophy. Left ventricular diastolic parameters are consistent with Grade II diastolic dysfunction (pseudonormalization).  2. Right ventricular systolic function is normal. The right ventricular size is normal. There is mildly elevated pulmonary artery systolic pressure.  3. The  mitral valve is normal in structure. Mild mitral valve regurgitation. No evidence of mitral stenosis.  4. The tricuspid valve is abnormal. Tricuspid valve regurgitation is mild to  moderate.  5. The aortic valve is tricuspid. Aortic valve regurgitation is not visualized. No aortic stenosis is present.  6. The inferior vena cava is dilated in size with >50% respiratory variability, suggesting right atrial pressure of 8 mmHg. Comparison(s): No prior Echocardiogram. FINDINGS  Left Ventricle: Left ventricular ejection fraction, by estimation, is 45 to 50%. The left ventricle has mildly decreased function. The left ventricle has no regional wall motion abnormalities. The left ventricular internal cavity size was normal in size. There is moderate left ventricular hypertrophy. Abnormal (paradoxical) septal motion, consistent with left bundle branch block. Left ventricular diastolic parameters are consistent with Grade II diastolic dysfunction (pseudonormalization). Right Ventricle: The right ventricular size is normal. No increase in right ventricular wall thickness. Right ventricular systolic function is normal. There is mildly elevated pulmonary artery systolic pressure. The tricuspid regurgitant velocity is 3.01  m/s, and with an assumed right atrial pressure of 8 mmHg, the estimated right ventricular systolic pressure is 44.2 mmHg. Left Atrium: Left atrial size was normal in size. Right Atrium: Right atrial size was normal in size. Pericardium: There is no evidence of pericardial effusion. Mitral Valve: The mitral valve is normal in structure. There is moderate thickening of the mitral valve leaflet(s). Normal mobility of the mitral valve leaflets. Mild mitral valve regurgitation. No evidence of mitral valve stenosis. MV peak gradient, 6.2  mmHg. The mean mitral valve gradient is 4.0 mmHg. Tricuspid Valve: The tricuspid valve is abnormal. Tricuspid valve regurgitation is mild to moderate. No evidence of tricuspid stenosis. Aortic Valve: The aortic valve is tricuspid. Aortic valve regurgitation is not visualized. No aortic stenosis is present. Aortic valve mean gradient measures 4.0 mmHg. Aortic  valve peak gradient measures 8.1 mmHg. Aortic valve area, by VTI measures 1.61 cm. Pulmonic Valve: The pulmonic valve was not well visualized. Pulmonic valve regurgitation is not visualized. No evidence of pulmonic stenosis. Aorta: The aortic root and ascending aorta are structurally normal, with no evidence of dilitation. Venous: The inferior vena cava is dilated in size with greater than 50% respiratory variability, suggesting right atrial pressure of 8 mmHg. IAS/Shunts: No atrial level shunt detected by color flow Doppler. Additional Comments: Spectral Doppler performed. Color Doppler performed.  LEFT VENTRICLE PLAX 2D LVIDd:         4.20 cm   Diastology LVIDs:         3.10 cm   LV e' medial:    5.77 cm/s LV PW:         1.20 cm   LV E/e' medial:  21.7 LV IVS:        1.70 cm   LV e' lateral:   8.16 cm/s LVOT diam:     1.80 cm   LV E/e' lateral: 15.3 LV SV:         34 LV SV Index:   19 LVOT Area:     2.54 cm  IVC IVC diam: 2.20 cm LEFT ATRIUM         Index LA diam:    3.00 cm 1.66 cm/m  AORTIC VALVE AV Area (Vmax):    1.61 cm AV Area (Vmean):   1.71 cm AV Area (VTI):     1.61 cm AV Vmax:           142.00 cm/s AV Vmean:  90.500 cm/s AV VTI:            0.212 m AV Peak Grad:      8.1 mmHg AV Mean Grad:      4.0 mmHg LVOT Vmax:         89.60 cm/s LVOT Vmean:        60.800 cm/s LVOT VTI:          0.134 m LVOT/AV VTI ratio: 0.63  AORTA Ao Root diam: 2.90 cm Ao Asc diam:  2.80 cm MITRAL VALVE                TRICUSPID VALVE MV Area (PHT): 4.06 cm     TR Peak grad:   36.2 mmHg MV Area VTI:   1.42 cm     TR Vmax:        301.00 cm/s MV Peak grad:  6.2 mmHg MV Mean grad:  4.0 mmHg     SHUNTS MV Vmax:       1.24 m/s     Systemic VTI:  0.13 m MV Vmean:      92.5 cm/s    Systemic Diam: 1.80 cm MV Decel Time: 187 msec MV E velocity: 125.00 cm/s MV A velocity: 116.00 cm/s MV E/A ratio:  1.08 Vishnu Priya Mallipeddi Electronically signed by Lorelee Cover Mallipeddi Signature Date/Time: 04/08/2022/5:21:33 PM    Final     VAS [place] LOWER EXTREMITY VENOUS (DVT)  Result Date: 04/08/2022  Lower Venous DVT Study Patient Name:  Amber [name]  Date of Exam:   04/08/2022 Medical Rec #: [id number]      Accession #:    [id number] Date of Birth: 09-Jun-1940      Patient Gender: F Patient Age:   77 years Exam Location:  [facility] Procedure:      VAS [place] LOWER EXTREMITY VENOUS (DVT) Referring Phys: Fuller Plan --------------------------------------------------------------------------------  Indications: Elevated D-Dimer.  Risk Factors: History of bilateral vein stripping. Comparison Study: Prior negative right LEV done 10/14/20 Performing Technologist: Sharion Dove RVS  Examination Guidelines: A complete evaluation includes B-mode imaging, spectral Doppler, color Doppler, and power Doppler as needed of all accessible portions of each vessel. Bilateral testing is considered an integral part of a complete examination. Limited examinations for reoccurring indications may be performed as noted. The reflux portion of the exam is performed with the patient in reverse Trendelenburg.  +---------+---------------+---------+-----------+----------+--------------+ RIGHT    CompressibilityPhasicitySpontaneityPropertiesThrombus Aging +---------+---------------+---------+-----------+----------+--------------+ CFV      Full           Yes      Yes                                 +---------+---------------+---------+-----------+----------+--------------+ SFJ      Full                                                        +---------+---------------+---------+-----------+----------+--------------+ FV Prox  Full                                                        +---------+---------------+---------+-----------+----------+--------------+ FV Mid  Full                                                        +---------+---------------+---------+-----------+----------+--------------+ FV DistalFull                                                         +---------+---------------+---------+-----------+----------+--------------+ PFV      Full                                                        +---------+---------------+---------+-----------+----------+--------------+ POP      Full           Yes      Yes                                 +---------+---------------+---------+-----------+----------+--------------+ PTV      Full                                                        +---------+---------------+---------+-----------+----------+--------------+ PERO     Full                                                        +---------+---------------+---------+-----------+----------+--------------+   +---------+---------------+---------+-----------+----------+--------------+ LEFT     CompressibilityPhasicitySpontaneityPropertiesThrombus Aging +---------+---------------+---------+-----------+----------+--------------+ CFV      Full           Yes      Yes                                 +---------+---------------+---------+-----------+----------+--------------+ SFJ      Full                                                        +---------+---------------+---------+-----------+----------+--------------+ FV Prox  Full                                                        +---------+---------------+---------+-----------+----------+--------------+ FV Mid   Full                                                        +---------+---------------+---------+-----------+----------+--------------+  FV DistalFull                                                        +---------+---------------+---------+-----------+----------+--------------+ PFV      Full                                                        +---------+---------------+---------+-----------+----------+--------------+ POP      Full           Yes      Yes                                  +---------+---------------+---------+-----------+----------+--------------+ PTV      Full                                                        +---------+---------------+---------+-----------+----------+--------------+ PERO     Full                                                        +---------+---------------+---------+-----------+----------+--------------+     Summary: BILATERAL: - No evidence of deep vein thrombosis seen in the lower extremities, bilaterally. -No evidence of popliteal cyst, bilaterally. RIGHT: interstitial edema noted throughout  LEFT: Interstitial edema noted throughout.  *See table(s) above for measurements and observations. Electronically signed by Monica Martinez MD on 04/08/2022 at 1:14:36 PM.    Final    CT ABDOMEN PELVIS WO CONTRAST  Result Date: 04/08/2022 CLINICAL DATA:  Urinary incontinence and frequency. EXAM: CT ABDOMEN AND PELVIS WITHOUT CONTRAST TECHNIQUE: Multidetector CT imaging of the abdomen and pelvis was performed following the standard protocol without IV contrast. RADIATION DOSE REDUCTION: This exam was performed according to the departmental dose-optimization program which includes automated exposure control, adjustment of the mA and/or kV according to patient size and/or use of iterative reconstruction technique. COMPARISON:  01/17/2013 FINDINGS: Lower chest: Very small bilateral pleural effusions and bibasilar atelectasis. The heart is normal in size. No pericardial effusion. Aortic and coronary artery calcifications are noted. Hepatobiliary: No hepatic lesions or intrahepatic biliary dilatation. Mild gallbladder distension but no CT findings to suggest acute cholecystitis. No common bile duct dilatation. Pancreas: Moderate atrophy but no mass or inflammation. Spleen: Normal size. No focal lesions. Adrenals/Urinary Tract: Adrenal glands and kidneys are grossly normal. No renal lesions or hydronephrosis. No obstructing ureteral calculi or bladder  calculi. There are moderate interstitial changes around the left kidney. Could not exclude the possibility of pyelonephritis. Recommend correlation with urinalysis. Stomach/Bowel: The stomach, duodenum, small bowel and colon are grossly normal. No acute inflammatory process, mass lesions or obstructive findings. Vascular/Lymphatic: Age related atherosclerotic calcification involving the aorta and branch vessels but no aneurysm. Small scattered mesenteric and retroperitoneal lymph nodes but no mass or overt adenopathy. Reproductive: The  uterus is surgically absent. Both ovaries are still present. There is a simple appearing 3.8 cm cyst associated with the right ovary. Recommend follow-up US in 6-12 months. Note: This recommendation does not apply to premenarchal patients and to those with increased risk (genetic, family history, elevated tumor markers or other high-risk factors) of ovarian cancer. Reference: JACR 2020 Feb; 17(2):248-254. The left ovary is normal. Other: No pelvic mass or adenopathy. No free pelvic fluid collections. No inguinal mass or adenopathy. No abdominal wall hernia or subcutaneous lesions. Musculoskeletal: Extensive postoperative changes involving the lumbar spine. Advanced degenerative disc disease at L5-S1. No acute bony findings or worrisome bone lesions. IMPRESSION: 1. Moderate interstitial changes and fluid around the left kidney could be due to pyelonephritis. Recommend correlation with urinalysis. 2. No obstructing ureteral calculi or bladder calculi. 3. 3.8 cm simple appearing right ovarian cyst. Recommend follow-up US in 6-12 months. 4. Very small bilateral pleural effusions and bibasilar atelectasis. 5. Age related atherosclerotic calcification involving the aorta and branch vessels. 6. Aortic atherosclerosis. Aortic Atherosclerosis (ICD10-I70.0). Electronically Signed   By: Marijo Sanes M.D.   On: 04/08/2022 11:56    Cardiac Studies   TTE from 04/08/2022   1. Left ventricular  ejection fraction, by estimation, is 45 to 50%. The  left ventricle has mildly decreased function. The left ventricle has no  regional wall motion abnormalities. There is moderate left ventricular  hypertrophy. Left ventricular  diastolic parameters are consistent with Grade II diastolic dysfunction  (pseudonormalization).   2. Right ventricular systolic function is normal. The right ventricular  size is normal. There is mildly elevated pulmonary artery systolic  pressure.   3. The mitral valve is normal in structure. Mild mitral valve  regurgitation. No evidence of mitral stenosis.   4. The tricuspid valve is abnormal. Tricuspid valve regurgitation is mild  to moderate.   5. The aortic valve is tricuspid. Aortic valve regurgitation is not  visualized. No aortic stenosis is present.   6. The inferior vena cava is dilated in size with >50% respiratory  variability, suggesting right atrial pressure of 8 mmHg.   Comparison(s): No prior Echocardiogram.   Patient Profile     81 y.o. female hx of hypertension, known LBBB, PAD, hypothyroidism, admitted 11/11 with AMS, hypotensive, E. Coli bacteremia and CT imaging consistent with pyelonephritis.  Also noted were incidental atherosclerotic calcification of the aorta and branch vessels. On IV antibiotics. Cards asked to see for elevated troponin.  Assessment & Plan    NSTEMI likely type 1 --+ acute chest pain yesterday on arrival to [facility].  Hx of angina at home.along with DOE improves after a few min. of rest--yesterday with morphine resolved. --EKG SR with known LBBB dating to 2021 at least --hs troponin 243; [id number] yesterday (on admit 34 and 27 --ddimer on admit 3.86 ? PE --PCXR NAD on admit now possible pulmonary edema --on ASA 81 now, plavix added, IV heparin, BB 25 BID  --NPO for possible cath  Dr. Stanford Breed to see   Pyelonephritis/Sepsis, E colic bacteremia/Septic shock per Dr. Marin Comment --fluid resuscitated remained off  pressors  CKD3b  --Cr on admit 1.63 now 1.09 with fluids  (baseline 0.76)  For questions or updates, please contact [place] Please consult [email or web address] for contact info under     Signed, Cecilie Kicks, NP  04/10/2022, 10:45 AM   As above, patient seen and examined.  She is not having chest pain at present but had some earlier today.  She denies dyspnea.  Troponin minimally elevated.  Plan to continue aspirin, heparin, Lopressor and statin.  Proceed with cardiac catheterization as she has had recurrent symptoms.  The risk and benefits including myocardial infarction, CVA and death discussed and she agrees to proceed.  No her LV function is mildly decreased on echocardiogram.  She will need follow-up studies in the future.  She is being treated for pyelonephritis/sepsis with Rocephin per primary service.  Kirk Ruths, MD

## 2022-04-10 NOTE — Progress Notes (Addendum)
Rounding Note    Patient Name: Amber [name] Date of Encounter: 04/10/2022  [facility] Cardiologist: Kirk Ruths, MD   Subjective   Some recurrent chest pain in night none currently   Inpatient Medications    Scheduled Meds:  aspirin EC  81 mg Oral Daily   atorvastatin  20 mg Oral Daily   Chlorhexidine Gluconate Cloth  6 each Topical Q0600   clopidogrel  75 mg Oral Daily   furosemide       gabapentin  100 mg Oral TID   levothyroxine  50 mcg Oral Q0600   metoprolol tartrate  25 mg Oral BID   oxyCODONE  5 mg Oral Q6H   pantoprazole  40 mg Oral Daily   sodium chloride flush  3 mL Intravenous Q12H   Continuous Infusions:  sodium chloride 20 mL/hr at 04/08/22 2000   sodium chloride 100 mL/hr at 04/09/22 2114   cefTRIAXone (ROCEPHIN)  IV 2 g (04/10/22 0524)   heparin 1,000 Units/hr (04/09/22 1957)   PRN Meds: acetaminophen **OR** acetaminophen, albuterol, clobetasol ointment, docusate sodium, furosemide, nitroGLYCERIN, nystatin, ondansetron **OR** ondansetron (ZOFRAN) IV, mouth rinse, polyethylene glycol, polyvinyl alcohol   Vital Signs    Vitals:   04/10/22 0500 04/10/22 0549 04/10/22 0600 04/10/22 0754  BP:  (!) 186/119 (!) 175/119 (!) 163/104  Pulse:  (!) 118 (!) 117 (!) 107  Resp:  (!) 26 (!) 23 (!) 21  Temp:    97.7 F (36.5 C)  TempSrc:    Oral  SpO2:  95% 99% 99%  Weight: 89.7 kg     Height:        Intake/Output Summary (Last 24 hours) at 04/10/2022 1045 Last data filed at 04/10/2022 0846 Gross per 24 hour  Intake 13 ml  Output 1650 ml  Net -1637 ml      04/10/2022    5:00 AM 04/09/2022    5:00 AM 04/08/2022    1:39 AM  Last 3 Weights  Weight (lbs) 197 lb 12 oz 176 lb 9.4 oz 175 lb  Weight (kg) 89.7 kg 80.1 kg 79.379 kg      Telemetry    SR to ST up to 110 - Personally Reviewed  ECG    No new - Personally Reviewed  Physical Exam   GEN: No acute distress.   Neck: No JVD Cardiac: RRR, no murmurs, rubs, or gallops.   Respiratory: Clear to auscultation bilaterally. GI: Soft, nontender, non-distended  MS: No edema; No deformity. Neuro:  Nonfocal  Psych: Normal affect   Labs    High Sensitivity Troponin:   Recent Labs  Lab 04/08/22 0224 04/08/22 0413 04/09/22 1614 04/09/22 1922 04/10/22 0052  TROPONINIHS 34* 27* 243* 308* 192*     Chemistry Recent Labs  Lab 04/08/22 0224 04/08/22 1606 04/09/22 0413  NA 136  --  136  K 3.3*  --  4.2  CL 102  --  103  CO2 24  --  18*  GLUCOSE 165*  --  122*  BUN 28*  --  31*  CREATININE 1.63* 1.71* 1.09*  CALCIUM 9.0  --  8.2*  MG 1.9  --   --   PROT 5.6*  --   --   ALBUMIN 3.3*  --   --   AST 36  --   --   ALT 28  --   --   ALKPHOS 58  --   --   BILITOT 0.9  --   --  GFRNONAA 31* 29* 50*  ANIONGAP 10  --  15    Lipids No results for input(s): "CHOL", "TRIG", "HDL", "LABVLDL", "LDLCALC", "CHOLHDL" in the last 168 hours.  Hematology Recent Labs  Lab 04/08/22 1606 04/09/22 0413 04/10/22 0052  WBC 36.7* 37.0* 35.2*  RBC 3.34* 3.26* 3.27*  HGB 10.6* 10.5* 10.6*  HCT 32.1* 30.1* 30.1*  MCV 96.1 92.3 92.0  MCH 31.7 32.2 32.4  MCHC 33.0 34.9 35.2  RDW 14.9 15.0 14.9  PLT 128* 112* 106*   Thyroid  Recent Labs  Lab 04/08/22 0859  TSH 0.957    BNPNo results for input(s): "BNP", "PROBNP" in the last 168 hours.  DDimer  Recent Labs  Lab 04/08/22 0859  DDIMER 3.86*     Radiology    DG Chest Port 1 View  Result Date: 04/10/2022 CLINICAL DATA:  81 year old female with history of respiratory distress. EXAM: PORTABLE CHEST 1 VIEW COMPARISON:  Chest x-ray 03/08/2022. FINDINGS: There is cephalization of the pulmonary vasculature and slight indistinctness of the interstitial markings suggestive of mild pulmonary edema. Trace bilateral pleural effusions. No pneumothorax. Heart size appears normal. Upper mediastinal contours are within normal limits. Atherosclerotic calcifications are noted in the thoracic aorta. IMPRESSION: 1. The appearance  of the chest is suggestive of pulmonary edema, however, heart size is normal. Findings could reflect noncardiogenic edema, severe diastolic heart failure, or alternative etiology such as atypical infection. Further clinical evaluation is recommended. 2. Aortic atherosclerosis. Electronically Signed   By: Vinnie Langton M.D.   On: 04/10/2022 06:21   ECHOCARDIOGRAM LIMITED  Result Date: 04/08/2022    ECHOCARDIOGRAM LIMITED REPORT   Patient Name:   Amber [name] Date of Exam: 04/08/2022 Medical Rec #:  [id number]     Height:       62.0 in Accession #:    [id number]    Weight:       175.0 lb Date of Birth:  [birth year]     BSA:          1.806 m Patient Age:    81 years      BP:           115/52 mmHg Patient Gender: F             HR:           105 bpm. Exam Location:  Inpatient Procedure: Limited Echo, Cardiac Doppler and Color Doppler                          STAT ECHO Reported to: Dr Claudina Lick on 04/08/2022 3:54:00 PM. Indications:    Pulmonary embolus  History:        Patient has no prior history of Echocardiogram examinations.                 Arrythmias:LBBB; Risk Factors:Hypertension and Former Smoker.                 Sepsis. AKI. Elevated troponin.  Sonographer:    Clayton Lefort RDCS (AE) Referring Phys: GRACE E BOWSER IMPRESSIONS  1. Left ventricular ejection fraction, by estimation, is 45 to 50%. The left ventricle has mildly decreased function. The left ventricle has no regional wall motion abnormalities. There is moderate left ventricular hypertrophy. Left ventricular diastolic parameters are consistent with Grade II diastolic dysfunction (pseudonormalization).  2. Right ventricular systolic function is normal. The right ventricular size is normal. There is mildly elevated pulmonary artery systolic pressure.  3. The  mitral valve is normal in structure. Mild mitral valve regurgitation. No evidence of mitral stenosis.  4. The tricuspid valve is abnormal. Tricuspid valve regurgitation is mild to  moderate.  5. The aortic valve is tricuspid. Aortic valve regurgitation is not visualized. No aortic stenosis is present.  6. The inferior vena cava is dilated in size with >50% respiratory variability, suggesting right atrial pressure of 8 mmHg. Comparison(s): No prior Echocardiogram. FINDINGS  Left Ventricle: Left ventricular ejection fraction, by estimation, is 45 to 50%. The left ventricle has mildly decreased function. The left ventricle has no regional wall motion abnormalities. The left ventricular internal cavity size was normal in size. There is moderate left ventricular hypertrophy. Abnormal (paradoxical) septal motion, consistent with left bundle branch block. Left ventricular diastolic parameters are consistent with Grade II diastolic dysfunction (pseudonormalization). Right Ventricle: The right ventricular size is normal. No increase in right ventricular wall thickness. Right ventricular systolic function is normal. There is mildly elevated pulmonary artery systolic pressure. The tricuspid regurgitant velocity is 3.01  m/s, and with an assumed right atrial pressure of 8 mmHg, the estimated right ventricular systolic pressure is 44.2 mmHg. Left Atrium: Left atrial size was normal in size. Right Atrium: Right atrial size was normal in size. Pericardium: There is no evidence of pericardial effusion. Mitral Valve: The mitral valve is normal in structure. There is moderate thickening of the mitral valve leaflet(s). Normal mobility of the mitral valve leaflets. Mild mitral valve regurgitation. No evidence of mitral valve stenosis. MV peak gradient, 6.2  mmHg. The mean mitral valve gradient is 4.0 mmHg. Tricuspid Valve: The tricuspid valve is abnormal. Tricuspid valve regurgitation is mild to moderate. No evidence of tricuspid stenosis. Aortic Valve: The aortic valve is tricuspid. Aortic valve regurgitation is not visualized. No aortic stenosis is present. Aortic valve mean gradient measures 4.0 mmHg. Aortic  valve peak gradient measures 8.1 mmHg. Aortic valve area, by VTI measures 1.61 cm. Pulmonic Valve: The pulmonic valve was not well visualized. Pulmonic valve regurgitation is not visualized. No evidence of pulmonic stenosis. Aorta: The aortic root and ascending aorta are structurally normal, with no evidence of dilitation. Venous: The inferior vena cava is dilated in size with greater than 50% respiratory variability, suggesting right atrial pressure of 8 mmHg. IAS/Shunts: No atrial level shunt detected by color flow Doppler. Additional Comments: Spectral Doppler performed. Color Doppler performed.  LEFT VENTRICLE PLAX 2D LVIDd:         4.20 cm   Diastology LVIDs:         3.10 cm   LV e' medial:    5.77 cm/s LV PW:         1.20 cm   LV E/e' medial:  21.7 LV IVS:        1.70 cm   LV e' lateral:   8.16 cm/s LVOT diam:     1.80 cm   LV E/e' lateral: 15.3 LV SV:         34 LV SV Index:   19 LVOT Area:     2.54 cm  IVC IVC diam: 2.20 cm LEFT ATRIUM         Index LA diam:    3.00 cm 1.66 cm/m  AORTIC VALVE AV Area (Vmax):    1.61 cm AV Area (Vmean):   1.71 cm AV Area (VTI):     1.61 cm AV Vmax:           142.00 cm/s AV Vmean:  90.500 cm/s AV VTI:            0.212 m AV Peak Grad:      8.1 mmHg AV Mean Grad:      4.0 mmHg LVOT Vmax:         89.60 cm/s LVOT Vmean:        60.800 cm/s LVOT VTI:          0.134 m LVOT/AV VTI ratio: 0.63  AORTA Ao Root diam: 2.90 cm Ao Asc diam:  2.80 cm MITRAL VALVE                TRICUSPID VALVE MV Area (PHT): 4.06 cm     TR Peak grad:   36.2 mmHg MV Area VTI:   1.42 cm     TR Vmax:        301.00 cm/s MV Peak grad:  6.2 mmHg MV Mean grad:  4.0 mmHg     SHUNTS MV Vmax:       1.24 m/s     Systemic VTI:  0.13 m MV Vmean:      92.5 cm/s    Systemic Diam: 1.80 cm MV Decel Time: 187 msec MV E velocity: 125.00 cm/s MV A velocity: 116.00 cm/s MV E/A ratio:  1.08 Vishnu Priya Mallipeddi Electronically signed by Lorelee Cover Mallipeddi Signature Date/Time: 04/08/2022/5:21:33 PM    Final     VAS [place] LOWER EXTREMITY VENOUS (DVT)  Result Date: 04/08/2022  Lower Venous DVT Study Patient Name:  Amber [name]  Date of Exam:   04/08/2022 Medical Rec #: [id number]      Accession #:    [id number] Date of Birth: June 21, 1940      Patient Gender: F Patient Age:   89 years Exam Location:  [facility] Procedure:      VAS [place] LOWER EXTREMITY VENOUS (DVT) Referring Phys: Fuller Plan --------------------------------------------------------------------------------  Indications: Elevated D-Dimer.  Risk Factors: History of bilateral vein stripping. Comparison Study: Prior negative right LEV done 10/14/20 Performing Technologist: Sharion Dove RVS  Examination Guidelines: A complete evaluation includes B-mode imaging, spectral Doppler, color Doppler, and power Doppler as needed of all accessible portions of each vessel. Bilateral testing is considered an integral part of a complete examination. Limited examinations for reoccurring indications may be performed as noted. The reflux portion of the exam is performed with the patient in reverse Trendelenburg.  +---------+---------------+---------+-----------+----------+--------------+ RIGHT    CompressibilityPhasicitySpontaneityPropertiesThrombus Aging +---------+---------------+---------+-----------+----------+--------------+ CFV      Full           Yes      Yes                                 +---------+---------------+---------+-----------+----------+--------------+ SFJ      Full                                                        +---------+---------------+---------+-----------+----------+--------------+ FV Prox  Full                                                        +---------+---------------+---------+-----------+----------+--------------+ FV Mid  Full                                                        +---------+---------------+---------+-----------+----------+--------------+ FV DistalFull                                                         +---------+---------------+---------+-----------+----------+--------------+ PFV      Full                                                        +---------+---------------+---------+-----------+----------+--------------+ POP      Full           Yes      Yes                                 +---------+---------------+---------+-----------+----------+--------------+ PTV      Full                                                        +---------+---------------+---------+-----------+----------+--------------+ PERO     Full                                                        +---------+---------------+---------+-----------+----------+--------------+   +---------+---------------+---------+-----------+----------+--------------+ LEFT     CompressibilityPhasicitySpontaneityPropertiesThrombus Aging +---------+---------------+---------+-----------+----------+--------------+ CFV      Full           Yes      Yes                                 +---------+---------------+---------+-----------+----------+--------------+ SFJ      Full                                                        +---------+---------------+---------+-----------+----------+--------------+ FV Prox  Full                                                        +---------+---------------+---------+-----------+----------+--------------+ FV Mid   Full                                                        +---------+---------------+---------+-----------+----------+--------------+  FV DistalFull                                                        +---------+---------------+---------+-----------+----------+--------------+ PFV      Full                                                        +---------+---------------+---------+-----------+----------+--------------+ POP      Full           Yes      Yes                                  +---------+---------------+---------+-----------+----------+--------------+ PTV      Full                                                        +---------+---------------+---------+-----------+----------+--------------+ PERO     Full                                                        +---------+---------------+---------+-----------+----------+--------------+     Summary: BILATERAL: - No evidence of deep vein thrombosis seen in the lower extremities, bilaterally. -No evidence of popliteal cyst, bilaterally. RIGHT: interstitial edema noted throughout  LEFT: Interstitial edema noted throughout.  *See table(s) above for measurements and observations. Electronically signed by Monica Martinez MD on 04/08/2022 at 1:14:36 PM.    Final    CT ABDOMEN PELVIS WO CONTRAST  Result Date: 04/08/2022 CLINICAL DATA:  Urinary incontinence and frequency. EXAM: CT ABDOMEN AND PELVIS WITHOUT CONTRAST TECHNIQUE: Multidetector CT imaging of the abdomen and pelvis was performed following the standard protocol without IV contrast. RADIATION DOSE REDUCTION: This exam was performed according to the departmental dose-optimization program which includes automated exposure control, adjustment of the mA and/or kV according to patient size and/or use of iterative reconstruction technique. COMPARISON:  01/17/2013 FINDINGS: Lower chest: Very small bilateral pleural effusions and bibasilar atelectasis. The heart is normal in size. No pericardial effusion. Aortic and coronary artery calcifications are noted. Hepatobiliary: No hepatic lesions or intrahepatic biliary dilatation. Mild gallbladder distension but no CT findings to suggest acute cholecystitis. No common bile duct dilatation. Pancreas: Moderate atrophy but no mass or inflammation. Spleen: Normal size. No focal lesions. Adrenals/Urinary Tract: Adrenal glands and kidneys are grossly normal. No renal lesions or hydronephrosis. No obstructing ureteral calculi or bladder  calculi. There are moderate interstitial changes around the left kidney. Could not exclude the possibility of pyelonephritis. Recommend correlation with urinalysis. Stomach/Bowel: The stomach, duodenum, small bowel and colon are grossly normal. No acute inflammatory process, mass lesions or obstructive findings. Vascular/Lymphatic: Age related atherosclerotic calcification involving the aorta and branch vessels but no aneurysm. Small scattered mesenteric and retroperitoneal lymph nodes but no mass or overt adenopathy. Reproductive: The  uterus is surgically absent. Both ovaries are still present. There is a simple appearing 3.8 cm cyst associated with the right ovary. Recommend follow-up US in 6-12 months. Note: This recommendation does not apply to premenarchal patients and to those with increased risk (genetic, family history, elevated tumor markers or other high-risk factors) of ovarian cancer. Reference: JACR 2020 Feb; 17(2):248-254. The left ovary is normal. Other: No pelvic mass or adenopathy. No free pelvic fluid collections. No inguinal mass or adenopathy. No abdominal wall hernia or subcutaneous lesions. Musculoskeletal: Extensive postoperative changes involving the lumbar spine. Advanced degenerative disc disease at L5-S1. No acute bony findings or worrisome bone lesions. IMPRESSION: 1. Moderate interstitial changes and fluid around the left kidney could be due to pyelonephritis. Recommend correlation with urinalysis. 2. No obstructing ureteral calculi or bladder calculi. 3. 3.8 cm simple appearing right ovarian cyst. Recommend follow-up US in 6-12 months. 4. Very small bilateral pleural effusions and bibasilar atelectasis. 5. Age related atherosclerotic calcification involving the aorta and branch vessels. 6. Aortic atherosclerosis. Aortic Atherosclerosis (ICD10-I70.0). Electronically Signed   By: Marijo Sanes M.D.   On: 04/08/2022 11:56    Cardiac Studies   TTE from 04/08/2022   1. Left ventricular  ejection fraction, by estimation, is 45 to 50%. The  left ventricle has mildly decreased function. The left ventricle has no  regional wall motion abnormalities. There is moderate left ventricular  hypertrophy. Left ventricular  diastolic parameters are consistent with Grade II diastolic dysfunction  (pseudonormalization).   2. Right ventricular systolic function is normal. The right ventricular  size is normal. There is mildly elevated pulmonary artery systolic  pressure.   3. The mitral valve is normal in structure. Mild mitral valve  regurgitation. No evidence of mitral stenosis.   4. The tricuspid valve is abnormal. Tricuspid valve regurgitation is mild  to moderate.   5. The aortic valve is tricuspid. Aortic valve regurgitation is not  visualized. No aortic stenosis is present.   6. The inferior vena cava is dilated in size with >50% respiratory  variability, suggesting right atrial pressure of 8 mmHg.   Comparison(s): No prior Echocardiogram.   Patient Profile     81 y.o. female hx of hypertension, known LBBB, PAD, hypothyroidism, admitted 11/11 with AMS, hypotensive, E. Coli bacteremia and CT imaging consistent with pyelonephritis.  Also noted were incidental atherosclerotic calcification of the aorta and branch vessels. On IV antibiotics. Cards asked to see for elevated troponin.  Assessment & Plan    NSTEMI likely type 1 --+ acute chest pain yesterday on arrival to 4E.  Hx of angina at home.along with DOE improves after a few min. of rest--yesterday with morphine resolved. --EKG SR with known LBBB dating to 2021 at least --hs troponin 243; [id number] yesterday (on admit 34 and 27 --ddimer on admit 3.86 ? PE --PCXR NAD on admit now possible pulmonary edema --on ASA 81 now, plavix added, IV heparin, BB 25 BID  --NPO for possible cath  Dr. Stanford Breed to see   Pyelonephritis/Sepsis, E colic bacteremia/Septic shock per Dr. Marin Comment --fluid resuscitated remained off  pressors  CKD3b  --Cr on admit 1.63 now 1.09 with fluids  (baseline 0.76)  For questions or updates, please contact [place] Please consult [email or web address] for contact info under     Signed, Cecilie Kicks, NP  04/10/2022, 10:45 AM   As above, patient seen and examined.  She is not having chest pain at present but had some earlier today.  She denies dyspnea.  Troponin minimally elevated.  Plan to continue aspirin, heparin, Lopressor and statin.  Proceed with cardiac catheterization as she has had recurrent symptoms.  The risk and benefits including myocardial infarction, CVA and death discussed and she agrees to proceed.  No her LV function is mildly decreased on echocardiogram.  She will need follow-up studies in the future.  She is being treated for pyelonephritis/sepsis with Rocephin per primary service.  Kirk Ruths, MD

## 2022-04-10 NOTE — Significant Event (Signed)
Rapid Response Event Note   Reason for Call :  CP and SOB  Initial Focused Assessment:  On arrival, pt laying in bed c/o pain to R lower chest-abdomen. Pt tachypnic and dyspneic at rest. Expiratory wheezing noted throughout bilateral upper lobes.   VS: HR 118, BP 186/119, RR 22, spO2 95% on 4L [place].    Interventions:  CXR EKG Albuterol Neb SL Nitro x1 20mg  Lasix  Pt states slight improvement in pain as well as breathing improvement after neb treatment.   Plan of Care:  Continue to monitor CP and respiratory status. Assess for improvement after diuresis. RN instructed to call with any changes or concerns.    Event Summary:   MD Notified: Primary team notified PTA. Cardiology to bedside Call Time: 0530 Arrival Time: 0545 End Time:0620  , RN

## 2022-04-10 NOTE — Interval H&P Note (Signed)
Cath Lab Visit (complete for each Cath Lab visit)  Clinical Evaluation Leading to the Procedure:   ACS: Yes.    Non-ACS:    Anginal Classification: CCS II  Anti-ischemic medical therapy: Minimal Therapy (1 class of medications)  Non-Invasive Test Results: No non-invasive testing performed  Prior CABG: No previous CABG      History and Physical Interval Note:  04/10/2022 4:12 PM  Amber [name]  has presented today for surgery, with the diagnosis of nstemi.  The various methods of treatment have been discussed with the patient and family. After consideration of risks, benefits and other options for treatment, the patient has consented to  Procedure(s): LEFT HEART CATH AND CORONARY ANGIOGRAPHY (N/A) as a surgical intervention.  The patient's history has been reviewed, patient examined, no change in status, stable for surgery.  I have reviewed the patient's chart and labs.  Questions were answered to the patient's satisfaction.     Nicki Guadalajara

## 2022-04-10 NOTE — Progress Notes (Signed)
ANTICOAGULATION CONSULT NOTE  Pharmacy Consult for heparin Indication: chest pain/ACS  Allergies  Allergen Reactions   Codeine Nausea And Vomiting   Darvocet [Propoxyphene N-Acetaminophen] Nausea And Vomiting   Vicodin [Hydrocodone-Acetaminophen] Other (See Comments)    Unknown reactions   Nickel Rash   Sulfa Antibiotics Rash    Like a sunburn    Patient Measurements: Height: 5\' 2"  (157.5 cm) Weight: 89.7 kg (197 lb 12 oz) IBW/kg (Calculated) : 50.1 Heparin Dosing Weight: 68kg   Vital Signs: Temp: 97.7 F (36.5 C) (11/13 0754) Temp Source: Oral (11/13 0754) BP: 163/104 (11/13 0754) Pulse Rate: 107 (11/13 0754)  Labs: Recent Labs    04/08/22 0224 04/08/22 0413 04/08/22 1606 04/09/22 0413 04/09/22 1614 04/09/22 1922 04/10/22 0052 04/10/22 0934  HGB 11.4*   < > 10.6* 10.5*  --   --  10.6*  --   HCT 33.2*   < > 32.1* 30.1*  --   --  30.1*  --   PLT 146*  --  128* 112*  --   --  106*  --   HEPARINUNFRC  --   --   --   --   --   --  0.42 0.28*  CREATININE 1.63*  --  1.71* 1.09*  --   --   --   --   TROPONINIHS 34*   < >  --   --  243* 308* 192*  --    < > = values in this interval not displayed.     Estimated Creatinine Clearance: 40.7 mL/min (A) (by C-G formula based on SCr of 1.09 mg/dL (H)).   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   04/07/2022 at 2300   atorvastatin (LIPITOR) 20 MG tablet TAKE 1 TABLET AT BEDTIME (Patient taking  differently: Take 20 mg by mouth daily.) 90 tablet 3 04/06/2022   B Complex Vitamins (VITAMIN-B COMPLEX) TABS Take 1 tablet by mouth daily.    04/07/2022   Cholecalciferol (VITAMIN D3) 1000 units CAPS Take 1,000 Units by mouth daily.    04/07/2022   clobetasol ointment (TEMOVATE) [id number] % Apply 1 Application topically daily as needed (for eczema).   Past Week   cyclobenzaprine (FLEXERIL) 10 MG tablet Take 1 tablet (10 mg total) by mouth 3 (three) times daily as needed for muscle spasms. 30 tablet 0 04/07/2022 at 2300   gabapentin (NEURONTIN) 100 MG capsule TAKE 1 CAPSULE(100 MG) BY MOUTH THREE TIMES DAILY (Patient taking differently: Take 100 mg by mouth 3 (three) times daily.) 90 capsule 1 04/07/2022 at 2300   levothyroxine (SYNTHROID) 50 MCG tablet Take 1 tablet (50 mcg total) by mouth daily before breakfast. 90 tablet 3 04/07/2022   losartan-hydrochlorothiazide (HYZAAR) 50-12.5 MG tablet TAKE 1 TABLET DAILY 90 tablet 3 04/06/2022   Multiple Vitamins-Minerals (PRESERVISION AREDS PO) Take 1 tablet by mouth 2 (two) times daily.    04/07/2022  nystatin (MYCOSTATIN/NYSTOP) powder Apply 1 Application topically daily as needed (for rash/redness).   Past Week   oxyCODONE (OXY IR/ROXICODONE) 5 MG immediate release tablet Take 1 tablet (5 mg total) by mouth 2 (two) times daily as needed for severe pain. 15 tablet 0 04/07/2022 at 2300   pantoprazole (PROTONIX) 40 MG tablet TAKE 1 TABLET(40 MG) BY MOUTH DAILY (Patient taking differently: Take 40 mg by mouth daily.) 90 tablet 3 04/07/2022   polyvinyl alcohol (LIQUIFILM TEARS) 1.4 % ophthalmic solution Place 1 drop into both eyes daily as needed for dry eyes.   Past Week   Scheduled:   aspirin EC  81 mg Oral Daily   atorvastatin  20 mg Oral Daily   Chlorhexidine Gluconate Cloth  6 each Topical Q0600   clopidogrel  75 mg Oral Daily   furosemide       gabapentin  100 mg Oral TID   levothyroxine  50 mcg Oral Q0600   metoprolol tartrate  25 mg Oral BID    oxyCODONE  5 mg Oral Q6H   pantoprazole  40 mg Oral Daily   sodium chloride flush  3 mL Intravenous Q12H   Infusions:   sodium chloride 20 mL/hr at 04/08/22 2000   sodium chloride 100 mL/hr at 04/09/22 2114   cefTRIAXone (ROCEPHIN)  IV 2 g (04/10/22 0524)   heparin 1,000 Units/hr (04/09/22 1957)    Assessment: Pt was admitted for weakness. She has a hx of DVT but not on AC. She is being treated for UTI and e.coli bacteremia. Heparin ordered to r/o MI.   Heparin level came back just slightly subtherapeutic at 0.28, on 1000 units/hr. No s/sx of bleeding or infusion issues.   Goal of Therapy:  Heparin level 0.3-0.7 units/ml Monitor platelets by anticoagulation protocol: Yes   Plan:  Increase heparin infusion to 1100 units/hr to get into goal range  Monitor daily HL, CBC, and for s/sx of bleeding  F/u timing of cath  Sherron Monday, PharmD, BCCCP Clinical Pharmacist  Phone: [phone] 04/10/2022 10:53 AM  Please check AMION for all [facility] Pharmacy phone numbers After 10:00 PM, call Main Pharmacy [phone]

## 2022-04-10 NOTE — Plan of Care (Signed)
  Problem: Education: Goal: Knowledge of General Education information will improve Description: Including pain rating scale, medication(s)/side effects and non-pharmacologic comfort measures Outcome: Progressing   Problem: Activity: Goal: Risk for activity intolerance will decrease Outcome: Progressing   

## 2022-04-11 ENCOUNTER — Encounter (HOSPITAL_COMMUNITY): Payer: Self-pay | Admitting: Cardiovascular Disease

## 2022-04-11 ENCOUNTER — Other Ambulatory Visit (HOSPITAL_COMMUNITY): Payer: Self-pay

## 2022-04-11 DIAGNOSIS — R072 Precordial pain: Secondary | ICD-10-CM | POA: Diagnosis not present

## 2022-04-11 DIAGNOSIS — A419 Sepsis, unspecified organism: Secondary | ICD-10-CM | POA: Diagnosis not present

## 2022-04-11 DIAGNOSIS — R6521 Severe sepsis with septic shock: Secondary | ICD-10-CM | POA: Diagnosis not present

## 2022-04-11 LAB — BASIC METABOLIC PANEL
Anion gap: 11 (ref 5–15)
BUN: 29 mg/dL — ABNORMAL HIGH (ref 8–23)
CO2: 20 mmol/L — ABNORMAL LOW (ref 22–32)
Calcium: 8.4 mg/dL — ABNORMAL LOW (ref 8.9–10.3)
Chloride: 107 mmol/L (ref 98–111)
Creatinine, Ser: 0.81 mg/dL (ref 0.44–1.00)
GFR, Estimated: >60 mL/min (ref >60)
Glucose, Bld: 167 mg/dL — ABNORMAL HIGH (ref 70–99)
Potassium: 3.8 mmol/L (ref 3.5–5.1)
Sodium: 138 mmol/L (ref 135–145)

## 2022-04-11 LAB — CBC
HCT: 32 % — ABNORMAL LOW (ref 36.0–46.0)
Hemoglobin: 11.3 g/dL — ABNORMAL LOW (ref 12.0–15.0)
MCH: 32.1 pg (ref 26.0–34.0)
MCHC: 35.3 g/dL (ref 30.0–36.0)
MCV: 90.9 fL (ref 80.0–100.0)
Platelets: 110 10*3/uL — ABNORMAL LOW (ref 150–400)
RBC: 3.52 MIL/uL — ABNORMAL LOW (ref 3.87–5.11)
RDW: 15 % (ref 11.5–15.5)
WBC: 25.3 10*3/uL — ABNORMAL HIGH (ref 4.0–10.5)
nRBC: 0.1 % (ref 0.0–0.2)

## 2022-04-11 LAB — HEPARIN LEVEL (UNFRACTIONATED): Heparin Unfractionated: <0.1 IU/mL — ABNORMAL LOW (ref 0.30–0.70)

## 2022-04-11 LAB — LACTIC ACID, PLASMA: Lactic Acid, Venous: 1.9 mmol/L (ref 0.5–1.9)

## 2022-04-11 MED ORDER — FUROSEMIDE 10 MG/ML IJ SOLN
20.0000 mg | Freq: Once | INTRAMUSCULAR | Status: AC
  Administered 2022-04-11: 20 mg via INTRAVENOUS
  Filled 2022-04-11: qty 2

## 2022-04-11 NOTE — Progress Notes (Signed)
PROGRESS NOTE  Amber [name]  DOB: 1940-10-03  PCP: Leamon Arnt, MD YEM:[id number]  DOA: 04/08/2022  LOS: 3 days  Hospital Day: 4  Brief narrative: Amber [name] is a 81 y.o. female with PMH significant for HTN, HLD, PAD, DVT, arthritis, hypothyroidism, varicose vein, rotator cuff rupture who lives alone at home, recently treated for UTI. 11/11, patient was brought to the ED from home with urinary incontinence, frequency, altered mental status. EMS noted a low blood pressure at 70/50 which improved with normal saline hydration.  In the ED, patient had a temperature of 100.3, tachycardia, tachypnea. Labs with WC count elevated to 33.7, creatinine 1.63, lactic acid elevated to 3.3 CT of the head and x-ray chest were unremarkable. Urinalysis showed large leukocytes, positive nitrites, many bacteria CT abdomen pelvis showed left pyelonephritis, no obstructing calculi. Patient was started on IV antibiotics, IV fluids Admitted to [facility] service for sepsis due to acute left pyelonephritis Within few hours of admission, patient's blood pressure started to drop again and hence she was transferred to ICU for septic shock. Urine culture and blood culture sent on admission grew E. coli. 11/13, transferred out to [facility].  Subjective: Patient was seen and examined this morning.  Lying on bed.  Not in distress.  Feels better today.  On low-flow oxygen.  Sister at bedside.    Assessment and plan: Septic shock - POA Acute left pyelonephritis E. coli UTI and bacteremia Patient met sepsis criteria on admission.  Within few hours, blood pressure dropped significantly and hence transferred to ICU for septic shock and started on pressors.  Eventually pressors weaned down.  Currently blood pressure is elevated. Urine culture and blood culture sent on admission grew E. coli. Initially started on IV Rocephin.  Switched to IV Ancef yesterday.   No fever.  WBC count 25.3 today but trending down.  Lactic acid  level normalized. Continue to monitor labs. Recent Labs  Lab 04/08/22 0224 04/08/22 0413 04/08/22 0859 04/08/22 1308 04/08/22 1606 04/09/22 0413 04/09/22 1321 04/10/22 0052 04/11/22 0123  WBC 33.7*  --   --   --  36.7* 37.0*  --  35.2* 25.3*  LATICACIDVEN 3.3* 2.6* 2.6* 2.8*  --   --  2.8*  --  1.9   NSTEMI Nonobstructive CAD Patient reported several weeks of exertional angina and dyspnea 10/12, during ambulation, she had recurrence of symptoms.  Symptoms improved with morphine IV.  Troponin was elevated, peaked at 308.  Trend as below. Patient was started on IV heparin drip. 11/13, underwent cardiac cath.  Found to have mild nonobstructive CAD. Continue aspirin, statin and metoprolol. Recent Labs    04/09/22 1614 04/09/22 1922 04/10/22 0052  TROPONINIHS 243* 308* 192*   Hypertension PTA on losartan 50 mg daily, HCTZ 12.5 mg daily BP meds were held because of septic shock.  Blood pressure improving, in 140s this morning. Currently on metoprolol 25 mg twice daily.  Continue to monitor.    History of PAD Aspirin, statin and metoprolol.   Hypothyroidism Synthroid  Chronic back pain Lumbar radiculopathy PTA on Neurontin 100 mg 3 times daily, oxycodone 5 mg as needed, Flexeril 10 mg 3 times daily as needed Resume all   Mild chronic anemia Hemoglobin stable close to 10.  Continue to monitor Recent Labs    04/08/22 0859 04/08/22 1606 04/09/22 0413 04/10/22 0052 04/11/22 0123  HGB 11.4* 10.6* 10.5* 10.6* 11.3*  MCV  --  96.1 92.3 92.0 90.9   Goals of care  Code Status: Full Code    Mobility: PT eval pending.  Skin assessment:     Nutritional status:  Body mass index is 36.17 kg/m.          Diet:  Diet Order             Diet Heart Room service appropriate? Yes; Fluid consistency: Thin  Diet effective now                   DVT prophylaxis:  enoxaparin (LOVENOX) injection 40 mg Start: 04/11/22 0800 SCD's Start: 04/10/22 1717 SCDs  Start: 04/08/22 1447   Antimicrobials: IV Ancef Fluid: Off IV fluid Consultants: Cardiology Family Communication: Sister at bedside  Status is: Inpatient  Continue in-hospital care because: Improving sepsis parameters, pending PT eval Level of care: Progressive   Dispo: The patient is from: Home              Anticipated d/c is to: Pending clinical course              Patient currently is not medically stable to d/c.   Difficult to place patient No     Infusions:   sodium chloride 20 mL/hr at 04/08/22 2000   sodium chloride      ceFAZolin (ANCEF) IV 2 g (04/11/22 1303)    Scheduled Meds:  aspirin EC  81 mg Oral Daily   atorvastatin  20 mg Oral Daily   Chlorhexidine Gluconate Cloth  6 each Topical Q0600   enoxaparin (LOVENOX) injection  40 mg Subcutaneous Q24H   gabapentin  100 mg Oral TID   levothyroxine  50 mcg Oral Q0600   metoprolol tartrate  25 mg Oral BID   oxyCODONE  5 mg Oral Q6H   pantoprazole  40 mg Oral Daily   sodium chloride flush  3 mL Intravenous Q12H   sodium chloride flush  3 mL Intravenous Q12H   sodium chloride flush  3 mL Intravenous Q12H    PRN meds: sodium chloride, acetaminophen, albuterol, clobetasol ointment, docusate sodium, nitroGLYCERIN, nystatin, ondansetron (ZOFRAN) IV, ondansetron **OR** [DISCONTINUED] ondansetron (ZOFRAN) IV, mouth rinse, polyethylene glycol, polyvinyl alcohol, sodium chloride flush   Antimicrobials: Anti-infectives (From admission, onward)    Start     Dose/Rate Route Frequency Ordered Stop   04/11/22 0600  ceFAZolin (ANCEF) IVPB 2g/100 mL premix        2 g 200 mL/hr over 30 Minutes Intravenous Every 8 hours 04/10/22 1136     04/09/22 0600  cefTRIAXone (ROCEPHIN) 2 g in sodium chloride 0.9 % 100 mL IVPB  Status:  Discontinued        2 g 200 mL/hr over 30 Minutes Intravenous Every 24 hours 04/08/22 1204 04/10/22 1136   04/08/22 0630  amoxicillin-clavulanate (AUGMENTIN) 875-125 MG per tablet 1 tablet        1 tablet  Oral  Once 04/08/22 0618 04/08/22 0636   04/08/22 0600  cefTRIAXone (ROCEPHIN) 2 g in sodium chloride 0.9 % 100 mL IVPB        2 g 200 mL/hr over 30 Minutes Intravenous  Once 04/08/22 0549 04/08/22 0858       Objective: Vitals:   04/11/22 0913 04/11/22 1100  BP: (!) 147/82 (!) 147/87  Pulse: 85 80  Resp: 18 18  Temp:  97.6 F (36.4 C)  SpO2: 99% 98%    Intake/Output Summary (Last 24 hours) at 04/11/2022 1333 Last data filed at 04/11/2022 1303 Gross per 24 hour  Intake 1065.03 ml  Output 2250  ml  Net -1184.97 ml   Filed Weights   04/08/22 0139 04/09/22 0500 04/10/22 0500  Weight: 79.4 kg 80.1 kg 89.7 kg   Weight change:  Body mass index is 36.17 kg/m.   Physical Exam: General exam: Pleasant, elderly female.  Not in distress. Skin: No rashes, lesions or ulcers. HEENT: Atraumatic, normocephalic, no obvious bleeding Lungs: Clear to auscultation bilaterally.  On low-flow oxygen CVS: Regular rate and rhythm, no murmur GI/Abd soft, nontender, nondistended, bowels are present CNS: Alert, awake, oriented x3 Psychiatry: Sad affect Extremities: No pedal edema, no calf tenderness  Data Review: I have personally reviewed the laboratory data and studies available.  F/u labs ordered Unresulted Labs (From admission, onward)     Start     Ordered   04/17/22 0500  Creatinine, serum  (enoxaparin (LOVENOX)    CrCl >/= 30 ml/min)  Weekly,   R     Comments: while on enoxaparin therapy   Question:  Specimen collection method  Answer:  Lab=Lab collect   04/10/22 1717   04/11/22 1448  Basic metabolic panel  Daily at 5am,   R     Question:  Specimen collection method  Answer:  Lab=Lab collect   04/10/22 1040   04/11/22 0500  Lipoprotein A (LPA)  Tomorrow morning,   R       Question:  Specimen collection method  Answer:  Lab=Lab collect   04/10/22 1717            Signed, Terrilee Croak, MD [facility] 04/11/2022

## 2022-04-11 NOTE — TOC Benefit Eligibility Note (Signed)
Patient Product/process development scientist completed.    The patient is currently admitted and upon discharge could be taking Entresto 24-26 mg.  The current 30 day co-pay is $38.00.   The patient is currently admitted and upon discharge could be taking Jardiance 10 mg.  The current 30 day co-pay is $38.00.   The patient is currently admitted and upon discharge could be taking Farxiga 10 mg.  Requires Prior Authorization   The patient is insured through [employer] [name] only one of [employer] that take [employer])     Roland Earl, CPhT Pharmacy Patient Advocate Specialist [facility] Patient Advocate Team Direct Number: [phone]  Fax: [phone]

## 2022-04-11 NOTE — TOC Initial Note (Addendum)
Transition of Care (TOC) - Initial/Assessment Note    Patient Details  Name: Amber [name] MRN: [id number] Date of Birth: 02/28/1941  Transition of Care (TOC) CM/SW Contact:    Cynthia N Johnson, LCSW Phone Number: 04/11/2022, 5:43 PM  Clinical Narrative:                  CSW met with patient at bedside- CSW introduced self and explained role. Patient confirmed she is aware of PT/OT recommendation for short term rehab at SNF. She states she is agrees with the recommendation. CSW explained the SNF process. Patient states her preferred SNF is [facility]. Will need PTAR for transport.   Patient's sister was present and requested CSW contact patient's son,Roy. CSW spoke with Roy by phone. He is agreeable to rehab at SNF. CSW explained the SNF process. CSW advised will inform of bed offers once available.   TOC will continue to follow and assist with discharge planning.   Cynthia Johnson, MSW, LCSW Clinical Social Worker       Expected Discharge Plan: Skilled Nursing Facility Barriers to Discharge: Continued Medical Work up, Insurance Authorization   Patient Goals and CMS Choice        Expected Discharge Plan and Services Expected Discharge Plan: Skilled Nursing Facility In-house Referral: Clinical Social Work     Living arrangements for the past 2 months: Single Family Home                                      Prior Living Arrangements/Services Living arrangements for the past 2 months: Single Family Home Lives with:: Self Patient language and need for interpreter reviewed:: No        Need for Family Participation in Patient Care: Yes (Comment) Care giver support system in place?: No (comment)   Criminal Activity/Legal Involvement Pertinent to Current Situation/Hospitalization: No - Comment as needed  Activities of Daily Living Home Assistive Devices/Equipment: Eyeglasses ADL Screening (condition at time of admission) Patient's cognitive ability  adequate to safely complete daily activities?: Yes Is the patient deaf or have difficulty hearing?: Yes Does the patient have difficulty seeing, even when wearing glasses/contacts?: No Does the patient have difficulty concentrating, remembering, or making decisions?: No Patient able to express need for assistance with ADLs?: Yes Does the patient have difficulty dressing or bathing?: No Independently performs ADLs?: Yes (appropriate for developmental age) Does the patient have difficulty walking or climbing stairs?: Yes Weakness of Legs: Both Weakness of Arms/Hands: None  Permission Sought/Granted Permission sought to share information with : Family Supports Permission granted to share information with : Yes, Verbal Permission Granted  Share Information with NAME: Roy Hernan  Permission granted to share info w AGENCY: SNFs  Permission granted to share info w Relationship: son  Permission granted to share info w Contact Information: [phone]  Emotional Assessment Appearance:: Appears stated age Attitude/Demeanor/Rapport: Engaged Affect (typically observed): Accepting, Appropriate Orientation: : Oriented to Self, Oriented to Place, Oriented to  Time, Oriented to Situation Alcohol / Substance Use: Not Applicable Psych Involvement: No (comment)  Admission diagnosis:  Elevated troponin [R79.89] Sepsis secondary to UTI (HCC) [A41.9, N39.0] Sepsis (HCC) [A41.9] Severe sepsis (HCC) [A41.9, R65.20] Urinary tract infection without hematuria, site unspecified [N39.0] Leukocytosis, unspecified type [D72.829] Patient Active Problem List   Diagnosis Date Noted   Chest pain of uncertain etiology 04/10/2022   NSTEMI (non-ST elevated myocardial infarction) (HCC) 04/09/2022     Urinary tract infection with hematuria 04/08/2022   Severe sepsis (HCC) 04/08/2022   Transient hypotension 04/08/2022   Acute metabolic encephalopathy 04/08/2022   Cellulitis, face 04/08/2022   Elevated troponin  04/08/2022   AKI (acute kidney injury) (HCC) 04/08/2022   Normocytic anemia 04/08/2022   Thrombocytopenia (HCC) 04/08/2022   Sepsis (HCC) 04/08/2022   Lumbosacral radiculopathy at S1 12/06/2020   Hyponatremia 12/06/2020   Mixed hyperlipidemia 12/06/2020   Osteopenia after menopause 12/06/2020   Class 1 obesity due to excess calories in adult 03/29/2020   Prediabetes 03/18/2020   Laryngopharyngeal reflux (LPR) 03/11/2019   Gastroesophageal reflux disease 01/19/2016   Hoarseness, chronic 12/28/2015   Complete left bundle branch block 12/08/2015   Essential hypertension 12/08/2015   Acquired hypothyroidism 12/08/2015   Peripheral vascular disease, unspecified (HCC) 04/18/2013   Chronic venous insufficiency 04/18/2013   PCP:  Andy, Camille L, MD Pharmacy:   [id number] - [place], [place] - [address] AT [address] [place] [address] Phone: [phone] Fax: [phone]  [facility] - [place], [place] - [name] [place] [address] Phone: [phone] Fax: [phone]     Social Determinants of Health (SDOH) Interventions    Readmission Risk Interventions     No data to display           

## 2022-04-11 NOTE — Plan of Care (Signed)
  Problem: Education: Goal: Knowledge of General Education information will improve Description: Including pain rating scale, medication(s)/side effects and non-pharmacologic comfort measures Outcome: Progressing   Problem: Activity: Goal: Risk for activity intolerance will decrease Outcome: Progressing   

## 2022-04-11 NOTE — Progress Notes (Addendum)
Rounding Note    Patient Name: Amber [name] Date of Encounter: 04/11/2022  [facility] Cardiologist: Olga Millers, MD   Subjective   Patient is sitting up in bed this morning, alert and in no acute distress. She denies any recurrence of chest pain. Overall feels that breathing is better, though says that she has developed an occasional dry cough.   Inpatient Medications    Scheduled Meds:  aspirin EC  81 mg Oral Daily   atorvastatin  20 mg Oral Daily   Chlorhexidine Gluconate Cloth  6 each Topical Q0600   clopidogrel  75 mg Oral Daily   enoxaparin (LOVENOX) injection  40 mg Subcutaneous Q24H   gabapentin  100 mg Oral TID   levothyroxine  50 mcg Oral Q0600   metoprolol tartrate  25 mg Oral BID   oxyCODONE  5 mg Oral Q6H   pantoprazole  40 mg Oral Daily   sodium chloride flush  3 mL Intravenous Q12H   sodium chloride flush  3 mL Intravenous Q12H   sodium chloride flush  3 mL Intravenous Q12H   Continuous Infusions:  sodium chloride 20 mL/hr at 04/08/22 2000   sodium chloride      ceFAZolin (ANCEF) IV 2 g (04/11/22 0705)   PRN Meds: sodium chloride, acetaminophen, albuterol, clobetasol ointment, docusate sodium, nitroGLYCERIN, nystatin, ondansetron (ZOFRAN) IV, ondansetron **OR** [DISCONTINUED] ondansetron (ZOFRAN) IV, mouth rinse, polyethylene glycol, polyvinyl alcohol, sodium chloride flush   Vital Signs    Vitals:   04/11/22 0723 04/11/22 0724 04/11/22 0745 04/11/22 0913  BP:   (!) 147/128 (!) 147/82  Pulse: 87 86 83 85  Resp: 16 15 13 18   Temp:   98 F (36.7 C)   TempSrc:   Oral   SpO2: 99% 99% 98% 99%  Weight:      Height:        Intake/Output Summary (Last 24 hours) at 04/11/2022 0917 Last data filed at 04/11/2022 0900 Gross per 24 hour  Intake 1406.07 ml  Output 1450 ml  Net -43.93 ml      04/10/2022    5:00 AM 04/09/2022    5:00 AM 04/08/2022    1:39 AM  Last 3 Weights  Weight (lbs) 197 lb 12 oz 176 lb 9.4 oz 175 lb  Weight (kg)  89.7 kg 80.1 kg 79.379 kg      Telemetry    Sinus rhythm with LBBB pattern - Personally Reviewed  ECG  No new tracing today  Physical Exam   GEN: No acute distress.   Neck: No JVD Cardiac: RRR, no murmurs, rubs, or gallops.  Respiratory: Clear to auscultation bilaterally. GI: Soft, nontender, non-distended  MS: No edema; No deformity. Right radial access site with small amount of ecchymosis visible. No hematoma, swelling. Neuro:  Nonfocal  Psych: Normal affect   Labs    High Sensitivity Troponin:   Recent Labs  Lab 04/08/22 0224 04/08/22 0413 04/09/22 1614 04/09/22 1922 04/10/22 0052  TROPONINIHS 34* 27* 243* 308* 192*     Chemistry Recent Labs  Lab 04/08/22 0224 04/08/22 1606 04/09/22 0413 04/11/22 0123  NA 136  --  136 138  K 3.3*  --  4.2 3.8  CL 102  --  103 107  CO2 24  --  18* 20*  GLUCOSE 165*  --  122* 167*  BUN 28*  --  31* 29*  CREATININE 1.63* 1.71* 1.09* 0.81  CALCIUM 9.0  --  8.2* 8.4*  MG 1.9  --   --   --  PROT 5.6*  --   --   --   ALBUMIN 3.3*  --   --   --   AST 36  --   --   --   ALT 28  --   --   --   ALKPHOS 58  --   --   --   BILITOT 0.9  --   --   --   GFRNONAA 31* 29* 50* >60  ANIONGAP 10  --  15 11    Lipids No results for input(s): "CHOL", "TRIG", "HDL", "LABVLDL", "LDLCALC", "CHOLHDL" in the last 168 hours.  Hematology Recent Labs  Lab 04/09/22 0413 04/10/22 0052 04/11/22 0123  WBC 37.0* 35.2* 25.3*  RBC 3.26* 3.27* 3.52*  HGB 10.5* 10.6* 11.3*  HCT 30.1* 30.1* 32.0*  MCV 92.3 92.0 90.9  MCH 32.2 32.4 32.1  MCHC 34.9 35.2 35.3  RDW 15.0 14.9 15.0  PLT 112* 106* 110*   Thyroid  Recent Labs  Lab 04/08/22 0859  TSH 0.957    BNPNo results for input(s): "BNP", "PROBNP" in the last 168 hours.  DDimer  Recent Labs  Lab 04/08/22 0859  DDIMER 3.86*     Radiology    CARDIAC CATHETERIZATION  Result Date: 04/10/2022   Mid LAD lesion is 20% stenosed.   Mid Cx lesion is 30% stenosed.   Mid Cx to Dist Cx lesion  is 20% stenosed.   Prox RCA-1 lesion is 30% stenosed.   Prox RCA-2 lesion is 20% stenosed.   Mid RCA lesion is 20% stenosed. Mild nonobstructive CAD with a percent mid LAD stenosis; 30% AV groove circumflex stenosis; and 20 to 30% RCA stenosis in a dominant vessel. There is moderate mitral annular calcification. Elevated LVEDP at 24 mm. RECOMMENDATION: Aspirin 81 mg.  Medical therapy for CAD.   DG Chest Port 1 View  Result Date: 04/10/2022 CLINICAL DATA:  81 year old female with history of respiratory distress. EXAM: PORTABLE CHEST 1 VIEW COMPARISON:  Chest x-ray 03/08/2022. FINDINGS: There is cephalization of the pulmonary vasculature and slight indistinctness of the interstitial markings suggestive of mild pulmonary edema. Trace bilateral pleural effusions. No pneumothorax. Heart size appears normal. Upper mediastinal contours are within normal limits. Atherosclerotic calcifications are noted in the thoracic aorta. IMPRESSION: 1. The appearance of the chest is suggestive of pulmonary edema, however, heart size is normal. Findings could reflect noncardiogenic edema, severe diastolic heart failure, or alternative etiology such as atypical infection. Further clinical evaluation is recommended. 2. Aortic atherosclerosis. Electronically Signed   By: Trudie Reed M.D.   On: 04/10/2022 06:21    Cardiac Studies   11/11/230 Limited TTE  IMPRESSIONS     1. Left ventricular ejection fraction, by estimation, is 45 to 50%. The  left ventricle has mildly decreased function. The left ventricle has no  regional wall motion abnormalities. There is moderate left ventricular  hypertrophy. Left ventricular  diastolic parameters are consistent with Grade II diastolic dysfunction  (pseudonormalization).   2. Right ventricular systolic function is normal. The right ventricular  size is normal. There is mildly elevated pulmonary artery systolic  pressure.   3. The mitral valve is normal in structure. Mild  mitral valve  regurgitation. No evidence of mitral stenosis.   4. The tricuspid valve is abnormal. Tricuspid valve regurgitation is mild  to moderate.   5. The aortic valve is tricuspid. Aortic valve regurgitation is not  visualized. No aortic stenosis is present.   6. The inferior vena cava is dilated in size  with >50% respiratory  variability, suggesting right atrial pressure of 8 mmHg.   Comparison(s): No prior Echocardiogram.   FINDINGS   Left Ventricle: Left ventricular ejection fraction, by estimation, is 45  to 50%. The left ventricle has mildly decreased function. The left  ventricle has no regional wall motion abnormalities. The left ventricular  internal cavity size was normal in  size. There is moderate left ventricular hypertrophy. Abnormal  (paradoxical) septal motion, consistent with left bundle branch block.  Left ventricular diastolic parameters are consistent with Grade II  diastolic dysfunction (pseudonormalization).   Right Ventricle: The right ventricular size is normal. No increase in  right ventricular wall thickness. Right ventricular systolic function is  normal. There is mildly elevated pulmonary artery systolic pressure. The  tricuspid regurgitant velocity is 3.01   m/s, and with an assumed right atrial pressure of 8 mmHg, the estimated  right ventricular systolic pressure is 44.2 mmHg.   Left Atrium: Left atrial size was normal in size.   Right Atrium: Right atrial size was normal in size.   Pericardium: There is no evidence of pericardial effusion.   Mitral Valve: The mitral valve is normal in structure. There is moderate  thickening of the mitral valve leaflet(s). Normal mobility of the mitral  valve leaflets. Mild mitral valve regurgitation. No evidence of mitral  valve stenosis. MV peak gradient, 6.2   mmHg. The mean mitral valve gradient is 4.0 mmHg.   Tricuspid Valve: The tricuspid valve is abnormal. Tricuspid valve  regurgitation is mild to  moderate. No evidence of tricuspid stenosis.   Aortic Valve: The aortic valve is tricuspid. Aortic valve regurgitation is  not visualized. No aortic stenosis is present. Aortic valve mean gradient  measures 4.0 mmHg. Aortic valve peak gradient measures 8.1 mmHg. Aortic  valve area, by VTI measures 1.61  cm.   Pulmonic Valve: The pulmonic valve was not well visualized. Pulmonic valve  regurgitation is not visualized. No evidence of pulmonic stenosis.   Aorta: The aortic root and ascending aorta are structurally normal, with  no evidence of dilitation.   Venous: The inferior vena cava is dilated in size with greater than 50%  respiratory variability, suggesting right atrial pressure of 8 mmHg.   IAS/Shunts: No atrial level shunt detected by color flow Doppler.   Additional Comments: Spectral Doppler performed. Color Doppler performed.      04/10/22 LHC    Mid LAD lesion is 20% stenosed.   Mid Cx lesion is 30% stenosed.   Mid Cx to Dist Cx lesion is 20% stenosed.   Prox RCA-1 lesion is 30% stenosed.   Prox RCA-2 lesion is 20% stenosed.   Mid RCA lesion is 20% stenosed.   Mild nonobstructive CAD with a percent mid LAD stenosis; 30% AV groove circumflex stenosis; and 20 to 30% RCA stenosis in a dominant vessel.   There is moderate mitral annular calcification.   Elevated LVEDP at 24 mm.   RECOMMENDATION: Aspirin 81 mg.  Medical therapy for CAD.   Patient Profile     81 y.o. female hx of hypertension, known LBBB, PAD, hypothyroidism, admitted 11/11 with AMS, hypotensive, E. Coli bacteremia and CT imaging consistent with pyelonephritis.  Also noted were incidental atherosclerotic calcification of the aorta and branch vessels. On IV antibiotics. Cards asked to see for elevated troponin.   Assessment & Plan     Chest pain NSTEMI Non-obstructive CAD   Patient admitted 2/2 AMS, hypotension, bacteremia found with elevated troponin in the setting of chest  pain. Pain resolved  with morphine. Patient with a recent history of exertional angina and dyspnea. ECG shows sinus rhythm, known LBBB. Troponin trend as follows: 34, 27, 243, 308, 192. D-Dimer 3.86. Patient treated for possible ACS with Lopressor 25mg  BID, Plavix, Heparin. Underwent LHC with Dr. on 11/14, found with mild non-obstructive CAD, 20% mid LAD, 30% AV groove circumflex stenosis; and 20 to 30% RCA stenosis in a dominant vessel.   Continue daily ASA 81mg . No clear role for ongoing P2Y12 therapy, will review with Dr. 12/14. Continue Lopressor 25mg  BID Plan for outpatient follow up   HFmrEF  Patient with limited TTE showing LVEF 45-50%. No regional wall motion abnormalities. Grade II diastolic dysfunction. Chest x-ray on 11/13 suggestive of pulmonary edema. Patient received 20mg  IV lasix yesterday with Jens Som urine output. However, only neg negative -293.33mL due to post cath IV fluids.  Patient euvolemic appearing on exam. No indication for further diuresis Although patient may have some reduction in LVEF due to LBBB, could consider adding MRA.   CKD stage IIIB  Patient with baseline creatinine around 0.76. Elevated earlier this admission, now 0.81.   Pyelonephritis/Sepsis with E.coli bacteremia  Patient continues to have leukocytosis, 25.3. Continues with Ancef per primary team.     For questions or updates, please contact [facility] Please consult [email or web address] for contact info under    Signed, 12/13, PA-C  04/11/2022, 9:17 AM   As above, patient seen and examined.  She denies dyspnea or chest pain this morning.  Radial cath site without hematoma.  Catheterization revealed mild nonobstructive coronary disease.  Plan to continue medical therapy with aspirin and statin.  LVEDP was elevated.  We will treat with Lasix 20 mg IV x1 today.  Continue remaining medications.  Therapy for pyelonephritis per primary service.  Cardiology will sign off.  I will arrange follow-up with  APP 4 to 6 weeks following discharge. , MD

## 2022-04-11 NOTE — Progress Notes (Signed)
Physical Therapy Treatment Patient Details Name: Amber [name] MRN: [id number] DOB: 04/20/1941 Today's Date: 04/11/2022   History of Present Illness Pt is a 81 y/o female presenting on 04/08/22 with AMS. Recently treated for UTI. Admitted with sepsis. CT abdomen with concern for pyleonephritis around L kidney. Cardiac cath 11/13. PMH includes: arthritis, DVT, HTN, macular degeneration, PVD, bil shoulder surgery, TKR, chronic back pain    PT Comments    Pt steady progression with mobility. Today's session focused on transfers and ambulation endurance. Ambulated 160 ft min guard, +2 for chair follow, max cues required to avoid bumping into obstacles on R side. Two standing rest breaks taken during gait, pt declined need for additional break time, although demonstrating DOE with mobility. Pt remains limited by generalized weakness, decreased activity tolerance, and impaired balance strategies/postural reactions. Continue to recommend acute PT services to maximize functional mobility and independence prior to d/c to SNF level therapies.  SpO2 100% on 4L [place], titrated down to 2L [place] at end of mobility session    Recommendations for follow up therapy are one component of a multi-disciplinary discharge planning process, led by the attending physician.  Recommendations may be updated based on patient status, additional functional criteria and insurance authorization.  Follow Up Recommendations  Skilled nursing-short term rehab (<3 hours/day) Can patient physically be transported by private vehicle: Yes   Assistance Recommended at Discharge Intermittent Supervision/Assistance  Patient can return home with the following A little help with walking and/or transfers;A little help with bathing/dressing/bathroom;Assistance with cooking/housework;Assist for transportation;Help with stairs or ramp for entrance   Equipment Recommendations  None recommended by PT    Recommendations for Other Services        Precautions / Restrictions Precautions Precautions: Fall Precaution Comments: R wrist cath 11/13 Restrictions Weight Bearing Restrictions: No     Mobility  Bed Mobility Overal bed mobility: Needs Assistance Bed Mobility: Supine to Sit     Supine to sit: Min assist     General bed mobility comments: minA for full upright trunk positioning; pt able to scoot hips to EOB with increased time; pt demonstrating AROM of BLE prior to transfer training to increase blood flow prior to mobility session    Transfers Overall transfer level: Needs assistance Equipment used: Rolling walker (2 wheels) Transfers: Sit to/from Stand Sit to Stand: Min guard, From elevated surface, Min assist           General transfer comment: min guard to stand x2 from EOB, cues for hand placement and provided assist to stabilize RW at beginning of transfer    Ambulation/Gait Ambulation/Gait assistance: Min guard, +2 safety/equipment Gait Distance (Feet): 160 Feet Assistive device: Rolling walker (2 wheels) Gait Pattern/deviations: Step-through pattern, Shuffle, Trunk flexed, Decreased stride length, Narrow base of support Gait velocity: decreased     General Gait Details: max cues required for pt to bring attention to R side during ambulation as bumped into multiple object during gait training; slow cautious gait observed, multiple offers of standing rest break due to DOE with pt accepting two times; +2 for chair follow with attempting to progress ambulation endurance   Stairs             Wheelchair Mobility    Modified Rankin (Stroke Patients Only)       Balance Overall balance assessment: Needs assistance Sitting-balance support: No upper extremity supported, Feet supported, Single extremity supported Sitting balance-Leahy Scale: Fair Sitting balance - Comments: static and dynamic sitting EOB during clothing mgmt with  no LOB   Standing balance support: Bilateral upper extremity  supported, During functional activity Standing balance-Leahy Scale: Poor Standing balance comment: with reliant on RW for B UE support                            Cognition Arousal/Alertness: Awake/alert Behavior During Therapy: WFL for tasks assessed/performed Overall Cognitive Status: Within Functional Limits for tasks assessed                                 General Comments: great ability to dual task and maintain conversation with ambulation; AOx4 with ability to process through upcoming holidays and discussed things pt would like to get done before then.        Exercises      General Comments General comments (skin integrity, edema, etc.): VSS on 4L [place], titrated down to 2L [place] as SpO2 reading 100% after mobility      Pertinent Vitals/Pain Pain Assessment Pain Assessment: Faces Faces Pain Scale: Hurts a little bit Pain Location: generalized Pain Descriptors / Indicators: Tiring Pain Intervention(s): Monitored during session    Home Living                          Prior Function            PT Goals (current goals can now be found in the care plan section) Acute Rehab PT Goals Patient Stated Goal: get stronger PT Goal Formulation: With patient/family Time For Goal Achievement: 04/23/22 Potential to Achieve Goals: Good Progress towards PT goals: Progressing toward goals    Frequency    Min 2X/week      PT Plan Current plan remains appropriate    Co-evaluation              AM-PAC PT "6 Clicks" Mobility   Outcome Measure  Help needed turning from your back to your side while in a flat bed without using bedrails?: A Little Help needed moving from lying on your back to sitting on the side of a flat bed without using bedrails?: A Little Help needed moving to and from a bed to a chair (including a wheelchair)?: A Little Help needed standing up from a chair using your arms (e.g., wheelchair or bedside chair)?: A  Little Help needed to walk in hospital room?: Total Help needed climbing 3-5 steps with a railing? : Total 6 Click Score: 14    End of Session Equipment Utilized During Treatment: Gait belt;Oxygen Activity Tolerance: Patient tolerated treatment well Patient left: in chair;with call bell/phone within reach;with family/visitor present Nurse Communication: Mobility status;Other (comment) (Oxygen status) PT Visit Diagnosis: Unsteadiness on feet (R26.81);Other abnormalities of gait and mobility (R26.89);Repeated falls (R29.6);Muscle weakness (generalized) (M62.81)     Time: 4782-9562 PT Time Calculation (min) (ACUTE ONLY): 42 min  Charges:  $Gait Training: 8-22 mins $Therapeutic Activity: 23-37 mins                     Alric Ran, SPT    [place] Darrion Macaulay 04/11/2022, 4:10 PM

## 2022-04-12 DIAGNOSIS — G9341 Metabolic encephalopathy: Secondary | ICD-10-CM | POA: Diagnosis not present

## 2022-04-12 DIAGNOSIS — A419 Sepsis, unspecified organism: Secondary | ICD-10-CM | POA: Diagnosis not present

## 2022-04-12 DIAGNOSIS — R079 Chest pain, unspecified: Secondary | ICD-10-CM

## 2022-04-12 DIAGNOSIS — I959 Hypotension, unspecified: Secondary | ICD-10-CM | POA: Diagnosis not present

## 2022-04-12 DIAGNOSIS — N390 Urinary tract infection, site not specified: Secondary | ICD-10-CM | POA: Diagnosis not present

## 2022-04-12 LAB — BASIC METABOLIC PANEL
Anion gap: 7 (ref 5–15)
BUN: 34 mg/dL — ABNORMAL HIGH (ref 8–23)
CO2: 23 mmol/L (ref 22–32)
Calcium: 8.3 mg/dL — ABNORMAL LOW (ref 8.9–10.3)
Chloride: 107 mmol/L (ref 98–111)
Creatinine, Ser: 0.8 mg/dL (ref 0.44–1.00)
GFR, Estimated: >60 mL/min (ref >60)
Glucose, Bld: 126 mg/dL — ABNORMAL HIGH (ref 70–99)
Potassium: 3.9 mmol/L (ref 3.5–5.1)
Sodium: 137 mmol/L (ref 135–145)

## 2022-04-12 LAB — CBC WITH DIFFERENTIAL/PLATELET
Abs Immature Granulocytes: 0.83 10*3/uL — ABNORMAL HIGH (ref 0.00–0.07)
Basophils Absolute: 0.1 10*3/uL (ref 0.0–0.1)
Basophils Relative: 1 %
Eosinophils Absolute: 0 10*3/uL (ref 0.0–0.5)
Eosinophils Relative: 0 %
HCT: 32.4 % — ABNORMAL LOW (ref 36.0–46.0)
Hemoglobin: 11.4 g/dL — ABNORMAL LOW (ref 12.0–15.0)
Immature Granulocytes: 4 %
Lymphocytes Relative: 10 %
Lymphs Abs: 1.9 10*3/uL (ref 0.7–4.0)
MCH: 31.6 pg (ref 26.0–34.0)
MCHC: 35.2 g/dL (ref 30.0–36.0)
MCV: 89.8 fL (ref 80.0–100.0)
Monocytes Absolute: 1.7 10*3/uL — ABNORMAL HIGH (ref 0.1–1.0)
Monocytes Relative: 9 %
Neutro Abs: 15.1 10*3/uL — ABNORMAL HIGH (ref 1.7–7.7)
Neutrophils Relative %: 76 %
Platelets: 114 10*3/uL — ABNORMAL LOW (ref 150–400)
RBC: 3.61 MIL/uL — ABNORMAL LOW (ref 3.87–5.11)
RDW: 14.6 % (ref 11.5–15.5)
WBC: 19.6 10*3/uL — ABNORMAL HIGH (ref 4.0–10.5)
nRBC: 0 % (ref 0.0–0.2)

## 2022-04-12 LAB — LIPOPROTEIN A (LPA): Lipoprotein (a): 10.3 nmol/L (ref <75.0)

## 2022-04-12 NOTE — TOC Progression Note (Signed)
Transition of Care [facility]) - Progression Note    Patient Details  Name: Amber [name] MRN: [id number] Date of Birth: 27-May-1941  [facility]) CM/SW Contact  Eduard Roux, [place] Phone Number: 04/12/2022, 4:24 PM  Clinical Narrative:     Informed patient's son of bed offers- family will review and inform CSW of their SNF choice  Expected Discharge Plan: Skilled Nursing Facility Barriers to Discharge: Continued Medical Work up, English as a second language teacher  Expected Discharge Plan and Services Expected Discharge Plan: Skilled Nursing Facility In-house Referral: Clinical Social Work     Living arrangements for the past 2 months: Single Family Home                                       Social Determinants of Health (SDOH) Interventions    Readmission Risk Interventions     No data to display

## 2022-04-12 NOTE — TOC Progression Note (Addendum)
Transition of Care [facility]) - Progression Note    Patient Details  Name: Amber [name] MRN: [id number] Date of Birth: Jul 23, 1940  Transition of Care [facility]) CM/SW Contact  Eduard Roux, [place] Phone Number: 04/12/2022, 12:02 PM  Clinical Narrative:     Provided patient with bed offers w/ medicare.gov star ratings  LVM for son to return call  Expected Discharge Plan: Skilled Nursing Facility Barriers to Discharge: Continued Medical Work up, English as a second language teacher  Expected Discharge Plan and Services Expected Discharge Plan: Skilled Nursing Facility In-house Referral: Clinical Social Work     Living arrangements for the past 2 months: Single Family Home                                       Social Determinants of Health (SDOH) Interventions    Readmission Risk Interventions     No data to display

## 2022-04-12 NOTE — Progress Notes (Signed)
PROGRESS NOTE    CLOTIEL TROOP  NWG:[id number] DOB: 1940-09-13 DOA: 04/08/2022 PCP: Leamon Arnt, MD   Brief Narrative:  No notes on file  Amber [name] is a 81 y.o. female with PMH significant for HTN, HLD, PAD, DVT, arthritis, hypothyroidism, varicose vein, rotator cuff rupture who lives alone at home, recently treated for UTI. 11/11, patient was brought to the ED from home with urinary incontinence, frequency, altered mental status. EMS noted a low blood pressure at 70/50 which improved with normal saline hydration.   In the ED, patient had a temperature of 100.3, tachycardia, tachypnea. Labs with WC count elevated to 33.7, creatinine 1.63, lactic acid elevated to 3.3 CT of the head and x-ray chest were unremarkable. Urinalysis showed large leukocytes, positive nitrites, many bacteria CT abdomen pelvis showed left pyelonephritis, no obstructing calculi. Patient was started on IV antibiotics, IV fluids Admitted to [facility] service for sepsis due to acute left pyelonephritis Within few hours of admission, patient's blood pressure started to drop again and hence she was transferred to ICU for septic shock. Urine culture and blood culture sent on admission grew E. coli. 11/13, transferred out to [facility].  Assessment and Plan: No notes have been filed under this hospital service. Service: Hospitalist   Septic Shock - POA Acute left pyelonephritis E. coli UTI and bacteremia -Patient met sepsis criteria on admission.  Within few hours, blood pressure dropped significantly and hence transferred to ICU for septic shock and started on pressors.   -Eventually pressors weaned down.  Currently blood pressure is elevated. -Urine culture and blood culture sent on admission grew E. coli. -Initially started on IV Rocephin.  Switched to IV Ancef 04/11/22.   -No fever.   -WBC went from 35.2 -> 25.3 -> 19.6 today but trending down.   -Lactic acid level normalized and is now 1.9 -Continue to monitor  labs. Last Labs             Recent Labs  Lab 04/08/22 0224 04/08/22 0413 04/08/22 0859 04/08/22 1308 04/08/22 1606 04/09/22 0413 04/09/22 1321 04/10/22 0052 04/11/22 0123  WBC 33.7*  --   --   --  36.7* 37.0*  --  35.2* 25.3*  LATICACIDVEN 3.3* 2.6* 2.6* 2.8*  --   --  2.8*  --  1.9    -Continue to Monitor and Trend WBC  -PT/OT Recommending SNF   NSTEMI Nonobstructive CAD -Patient reported several weeks of exertional angina and dyspnea -10/12, during ambulation, she had recurrence of symptoms.  Symptoms improved with morphine IV.   -Troponin was elevated, peaked at 308.  Trend as below. -Patient was started on IV heparin drip and now stopped  -11/13, underwent cardiac cath.  Found to have mild nonobstructive CAD. -Continue Aspirin 81 mg po Daily, Atorvastatin 20 mg po Daily  and Metoprolol Tartrate 25 mg po BID and NTG 0.4 mg SL q76mnprn. Recent Labs (last 2 labs)       Recent Labs    04/09/22 1614 04/09/22 1922 04/10/22 0052  TROPONINIHS 243* 308* 192*      Hypertension -PTA on losartan 50 mg daily, HCTZ 12.5 mg daily -BP meds were held because of septic shock.  Blood pressure improving, in 140s this morning. -Currently on metoprolol 25 mg twice daily.   -Continue to monitor BP per Protocol and last BP was slightly elevated at 172/85   History of PAD -As above C/w Aspirin, statin and metoprolol.   Hypothyroidism -C/w Levothyroxine 50 mcg po Daily  Chronic Back Pain Lumbar radiculopathy Chronic Hip Pain  -PTA on Neurontin 100 mg 3 times daily, oxycodone 5 mg as needed, Flexeril 10 mg 3 times daily as needed -Resumed all -Recently got an injection in her Left and Right Hip bursa    Mild chronic Anemia -Patient's Hgb/Hct went from 10.6/30.1 -> 11.3/32.0 -> 11.4/32.4 -Check Anemia Panel in the AM -Continue to Monitor for S/Sx of Bleeding; No overt bleeding noted -Repeat CBC in the AM   Obesity -Complicates overall prognosis and care -Estimated body mass  index is 36.17 kg/m as calculated from the following:   Height as of this encounter: _0  (1.575 m).   Weight as of this encounter: 89.7 kg.  -Weight Loss and Dietary Counseling given   DVT prophylaxis: enoxaparin (LOVENOX) injection 40 mg Start: 04/11/22 0800 SCD's Start: 04/10/22 1717 SCDs Start: 04/08/22 1447    Code Status: Full Code Family Communication: Discussed with Sister at bedside   Disposition Plan:  Level of care: Progressive Status is: Inpatient Remains inpatient appropriate because: Needs PT and OT to follow-up and recommending SNF and will need a safe discharge disposition and place can go home with informed authorization   Consultants:  PCCM Transfer Cardiology   Procedures:  As delineated as above   CARDIAC CATH   Mid LAD lesion is 20% stenosed.   Mid Cx lesion is 30% stenosed.   Mid Cx to Dist Cx lesion is 20% stenosed.   Prox RCA-1 lesion is 30% stenosed.   Prox RCA-2 lesion is 20% stenosed.   Mid RCA lesion is 20% stenosed.   Mild nonobstructive CAD with a percent mid LAD stenosis; 30% AV groove circumflex stenosis; and 20 to 30% RCA stenosis in a dominant vessel.   There is moderate mitral annular calcification.   Elevated LVEDP at 24 mm.   RECOMMENDATION: Aspirin 81 mg.  Medical therapy for CAD.  Antimicrobials:  Anti-infectives (From admission, onward)    Start     Dose/Rate Route Frequency Ordered Stop   04/11/22 0600  ceFAZolin (ANCEF) IVPB 2g/100 mL premix        2 g 200 mL/hr over 30 Minutes Intravenous Every 8 hours 04/10/22 1136     04/09/22 0600  cefTRIAXone (ROCEPHIN) 2 g in sodium chloride 0.9 % 100 mL IVPB  Status:  Discontinued        2 g 200 mL/hr over 30 Minutes Intravenous Every 24 hours 04/08/22 1204 04/10/22 1136   04/08/22 0630  amoxicillin-clavulanate (AUGMENTIN) 875-125 MG per tablet 1 tablet        1 tablet Oral  Once 04/08/22 0618 04/08/22 0636   04/08/22 0600  cefTRIAXone (ROCEPHIN) 2 g in sodium chloride 0.9 % 100  mL IVPB        2 g 200 mL/hr over 30 Minutes Intravenous  Once 04/08/22 0549 04/08/22 0858       Subjective: Seen and examined at bedside she is sitting in the chair still complaining of some left hip pain and back pain. No Nausea and Vomiting. Feels better. No other concerns or complaints at this time.   Objective: Vitals:   04/11/22 1400 04/11/22 1933 04/11/22 2335 04/12/22 0300  BP: 138/70 (!) 162/91 (!) 147/88 (!) 143/71  Pulse: 81 (!) 107 86 72  Resp: _1 Temp:   97.8 F (36.6 C) 97.8 F (36.6 C)  TempSrc:   Oral Oral  SpO2: 99% 100% 97% 95%  Weight:      Height:  Intake/Output Summary (Last 24 hours) at 04/12/2022 0755 Last data filed at 04/11/2022 2000 Gross per 24 hour  Intake 1100 ml  Output 2700 ml  Net -1600 ml   Filed Weights   04/08/22 0139 04/09/22 0500 04/10/22 0500  Weight: 79.4 kg 80.1 kg 89.7 kg   Examination: Physical Exam:  Constitutional: WN/WD obese Caucasian female currently no acute distress Respiratory: Diminished to auscultation bilaterally, no wheezing, rales, rhonchi or crackles. Normal respiratory effort and patient is not tachypenic. No accessory muscle use.  Unlabored breathing Cardiovascular: RRR, no murmurs / rubs / gallops. S1 and S2 auscultated. No extremity edema. Abdomen: Soft, non-tender, distended secondary to body habitus.  Bowel sounds positive.  GU: Deferred. Musculoskeletal: No clubbing / cyanosis of digits/nails. No joint deformity upper and lower extremities.  Skin: No rashes, lesions, ulcers on limited skin evaluation but does have some bruising of the upper extremities especially where she had her cardiac cath from her right wrist. No induration; Warm and dry.  Neurologic: CN 2-12 grossly intact with no focal deficits. Romberg sign and cerebellar reflexes not assessed.  Psychiatric: Normal judgment and insight. Alert and oriented x 3. Normal mood and appropriate affect.   Data Reviewed: I have personally  reviewed following labs and imaging studies  CBC: Recent Labs  Lab 04/08/22 0224 04/08/22 0859 04/08/22 1606 04/09/22 0413 04/10/22 0052 04/11/22 0123  WBC 33.7*  --  36.7* 37.0* 35.2* 25.3*  NEUTROABS 28.4*  --   --   --   --   --   HGB 11.4* 11.4* 10.6* 10.5* 10.6* 11.3*  HCT 33.2* 33.7* 32.1* 30.1* 30.1* 32.0*  MCV 93.5  --  96.1 92.3 92.0 90.9  PLT 146*  --  128* 112* 106* 563*   Basic Metabolic Panel: Recent Labs  Lab 04/08/22 0224 04/08/22 1606 04/09/22 0413 04/11/22 0123 04/12/22 0138  NA 136  --  136 138 137  K 3.3*  --  4.2 3.8 3.9  CL 102  --  103 107 107  CO2 24  --  18* 20* 23  GLUCOSE 165*  --  122* 167* 126*  BUN 28*  --  31* 29* 34*  CREATININE 1.63* 1.71* 1.09* 0.81 0.80  CALCIUM 9.0  --  8.2* 8.4* 8.3*  MG 1.9  --   --   --   --    GFR: Estimated Creatinine Clearance: 55.4 mL/min (by C-G formula based on SCr of 0.8 mg/dL). Liver Function Tests: Recent Labs  Lab 04/08/22 0224  AST 36  ALT 28  ALKPHOS 58  BILITOT 0.9  PROT 5.6*  ALBUMIN 3.3*   No results for input(s): "LIPASE", "AMYLASE" in the last 168 hours. No results for input(s): "AMMONIA" in the last 168 hours. Coagulation Profile: No results for input(s): "INR", "PROTIME" in the last 168 hours. Cardiac Enzymes: No results for input(s): "CKTOTAL", "CKMB", "CKMBINDEX", "TROPONINI" in the last 168 hours. BNP (last 3 results) No results for input(s): "PROBNP" in the last 8760 hours. HbA1C: No results for input(s): "HGBA1C" in the last 72 hours. CBG: No results for input(s): "GLUCAP" in the last 168 hours. Lipid Profile: No results for input(s): "CHOL", "HDL", "LDLCALC", "TRIG", "CHOLHDL", "LDLDIRECT" in the last 72 hours. Thyroid Function Tests: No results for input(s): "TSH", "T4TOTAL", "FREET4", "T3FREE", "THYROIDAB" in the last 72 hours. Anemia Panel: No results for input(s): "VITAMINB12", "FOLATE", "FERRITIN", "TIBC", "IRON", "RETICCTPCT" in the last 72 hours. Sepsis  Labs: Recent Labs  Lab 04/08/22 0859 04/08/22 1308 04/09/22 1321 04/11/22 0123  LATICACIDVEN 2.6* 2.8* 2.8* 1.9    Recent Results (from the past 240 hour(s))  Urine Culture     Status: Abnormal   Collection Time: 04/08/22  5:28 AM   Specimen: Urine, Catheterized  Result Value Ref Range Status   Specimen Description URINE, CATHETERIZED  Final   Special Requests   Final    NONE Performed at [facility], [address] [place], [place] [address]    Culture >=100,000 COLONIES/mL ESCHERICHIA COLI (A)  Final   Report Status 04/10/2022 FINAL  Final   Organism ID, Bacteria ESCHERICHIA COLI (A)  Final      Susceptibility   Escherichia coli - MIC*    AMPICILLIN <=2 SENSITIVE Sensitive     CEFAZOLIN <=4 SENSITIVE Sensitive     CEFEPIME <=0.12 SENSITIVE Sensitive     CEFTRIAXONE <=0.25 SENSITIVE Sensitive     CIPROFLOXACIN <=0.25 SENSITIVE Sensitive     GENTAMICIN <=1 SENSITIVE Sensitive     IMIPENEM <=0.25 SENSITIVE Sensitive     NITROFURANTOIN <=16 SENSITIVE Sensitive     TRIMETH/SULFA <=20 SENSITIVE Sensitive     AMPICILLIN/SULBACTAM <=2 SENSITIVE Sensitive     PIP/TAZO <=4 SENSITIVE Sensitive     * >=100,000 COLONIES/mL ESCHERICHIA COLI  Blood culture (routine x 2)     Status: Abnormal   Collection Time: 04/08/22  6:40 AM   Specimen: BLOOD  Result Value Ref Range Status   Specimen Description BLOOD RIGHT ANTECUBITAL  Final   Special Requests   Final    BOTTLES DRAWN AEROBIC AND ANAEROBIC Blood Culture adequate volume   Culture  Setup Time   Final    IN BOTH AEROBIC AND ANAEROBIC BOTTLES GRAM NEGATIVE RODS Organism ID to follow CRITICAL RESULT CALLED TO, READ BACK BY AND VERIFIED WITH:  C/ PHARMD L. CHEN 04/08/22 1920 A. LAFRANCE  Performed at [facility], [place] [address] [place], [place] [address]    Culture ESCHERICHIA COLI (A)  Final   Report Status 04/10/2022 FINAL  Final   Organism ID, Bacteria ESCHERICHIA COLI  Final      Susceptibility    Escherichia coli - MIC*    AMPICILLIN <=2 SENSITIVE Sensitive     CEFAZOLIN <=4 SENSITIVE Sensitive     CEFEPIME <=0.12 SENSITIVE Sensitive     CEFTAZIDIME <=1 SENSITIVE Sensitive     CEFTRIAXONE <=0.25 SENSITIVE Sensitive     CIPROFLOXACIN <=0.25 SENSITIVE Sensitive     GENTAMICIN <=1 SENSITIVE Sensitive     IMIPENEM <=0.25 SENSITIVE Sensitive     TRIMETH/SULFA <=20 SENSITIVE Sensitive     AMPICILLIN/SULBACTAM <=2 SENSITIVE Sensitive     PIP/TAZO <=4 SENSITIVE Sensitive     * ESCHERICHIA COLI  Blood Culture ID Panel (Reflexed)     Status: Abnormal   Collection Time: 04/08/22  6:40 AM  Result Value Ref Range Status   Enterococcus faecalis NOT DETECTED NOT DETECTED Final   Enterococcus Faecium NOT DETECTED NOT DETECTED Final   Listeria monocytogenes NOT DETECTED NOT DETECTED Final   Staphylococcus species NOT DETECTED NOT DETECTED Final   Staphylococcus aureus (BCID) NOT DETECTED NOT DETECTED Final   Staphylococcus epidermidis NOT DETECTED NOT DETECTED Final   Staphylococcus lugdunensis NOT DETECTED NOT DETECTED Final   Streptococcus species NOT DETECTED NOT DETECTED Final   Streptococcus agalactiae NOT DETECTED NOT DETECTED Final   Streptococcus pneumoniae NOT DETECTED NOT DETECTED Final   Streptococcus pyogenes NOT DETECTED NOT DETECTED Final   A.calcoaceticus-baumannii NOT DETECTED NOT DETECTED Final   Bacteroides fragilis NOT  DETECTED NOT DETECTED Final   Enterobacterales DETECTED (A) NOT DETECTED Final    Comment: Enterobacterales represent a large order of gram negative bacteria, not a single organism. CRITICAL RESULT CALLED TO, READ BACK BY AND VERIFIED WITH:  C/ PHARMD L. CHEN 04/08/22 1920 A. LAFRANCE     Enterobacter cloacae complex NOT DETECTED NOT DETECTED Final   Escherichia coli DETECTED (A) NOT DETECTED Final    Comment: CRITICAL RESULT CALLED TO, READ BACK BY AND VERIFIED WITH:  C/ PHARMD L. CHEN 04/08/22 1920 A. LAFRANCE     Klebsiella aerogenes NOT DETECTED NOT  DETECTED Final   Klebsiella oxytoca NOT DETECTED NOT DETECTED Final   Klebsiella pneumoniae NOT DETECTED NOT DETECTED Final   Proteus species NOT DETECTED NOT DETECTED Final   Salmonella species NOT DETECTED NOT DETECTED Final   Serratia marcescens NOT DETECTED NOT DETECTED Final   Haemophilus influenzae NOT DETECTED NOT DETECTED Final   Neisseria meningitidis NOT DETECTED NOT DETECTED Final   Pseudomonas aeruginosa NOT DETECTED NOT DETECTED Final   Stenotrophomonas maltophilia NOT DETECTED NOT DETECTED Final   Candida albicans NOT DETECTED NOT DETECTED Final   Candida auris NOT DETECTED NOT DETECTED Final   Candida glabrata NOT DETECTED NOT DETECTED Final   Candida krusei NOT DETECTED NOT DETECTED Final   Candida parapsilosis NOT DETECTED NOT DETECTED Final   Candida tropicalis NOT DETECTED NOT DETECTED Final   Cryptococcus neoformans/gattii NOT DETECTED NOT DETECTED Final   CTX-M ESBL NOT DETECTED NOT DETECTED Final   Carbapenem resistance IMP NOT DETECTED NOT DETECTED Final   Carbapenem resistance KPC NOT DETECTED NOT DETECTED Final   Carbapenem resistance NDM NOT DETECTED NOT DETECTED Final   Carbapenem resist OXA 48 LIKE NOT DETECTED NOT DETECTED Final   Carbapenem resistance VIM NOT DETECTED NOT DETECTED Final    Comment: Performed at [facility], [address], [place], [place] [address]  MRSA Next Gen by PCR, Nasal     Status: None   Collection Time: 04/08/22  4:35 PM   Specimen: Nasal Mucosa; Nasal Swab  Result Value Ref Range Status   MRSA by PCR Next Gen NOT DETECTED NOT DETECTED Final    Comment: (NOTE) The GeneXpert MRSA Assay (FDA approved for NASAL specimens only), is one component of a comprehensive MRSA colonization surveillance program. It is not intended to diagnose MRSA infection nor to guide or monitor treatment for MRSA infections. Test performance is not FDA approved in patients less than 65 years old. Performed at [facility], [place] [address] [place], [place] [address]   Blood culture (routine x 2)     Status: None (Preliminary result)   Collection Time: 04/08/22  5:07 PM   Specimen: BLOOD LEFT HAND  Result Value Ref Range Status   Specimen Description BLOOD LEFT HAND  Final   Special Requests   Final    BOTTLES DRAWN AEROBIC AND ANAEROBIC Blood Culture results may not be optimal due to an inadequate volume of blood received in culture bottles   Culture   Final    NO GROWTH 3 DAYS Performed at [facility], [place] [address], [place], [place] [address]    Report Status PENDING  Incomplete    Radiology Studies: CARDIAC CATHETERIZATION  Result Date: 04/10/2022   Mid LAD lesion is 20% stenosed.   Mid Cx lesion is 30% stenosed.   Mid Cx to Dist Cx lesion is 20% stenosed.   Prox RCA-1 lesion is 30% stenosed.  Prox RCA-2 lesion is 20% stenosed.   Mid RCA lesion is 20% stenosed. Mild nonobstructive CAD with a percent mid LAD stenosis; 30% AV groove circumflex stenosis; and 20 to 30% RCA stenosis in a dominant vessel. There is moderate mitral annular calcification. Elevated LVEDP at 24 mm. RECOMMENDATION: Aspirin 81 mg.  Medical therapy for CAD.    Scheduled Meds:  aspirin EC  81 mg Oral Daily   atorvastatin  20 mg Oral Daily   Chlorhexidine Gluconate Cloth  6 each Topical Q0600   enoxaparin (LOVENOX) injection  40 mg Subcutaneous Q24H   gabapentin  100 mg Oral TID   levothyroxine  50 mcg Oral Q0600   metoprolol tartrate  25 mg Oral BID   oxyCODONE  5 mg Oral Q6H   pantoprazole  40 mg Oral Daily   sodium chloride flush  3 mL Intravenous Q12H   sodium chloride flush  3 mL Intravenous Q12H   sodium chloride flush  3 mL Intravenous Q12H   Continuous Infusions:  sodium chloride 20 mL/hr at 04/08/22 2000   sodium chloride      ceFAZolin (ANCEF) IV 2 g (04/12/22 0706)    LOS: 4 days   Raiford Noble, DO [facility] Available via Epic secure chat 7am-7pm After these hours, please refer to coverage provider listed on  [email or web address] 04/12/2022, 7:55 AM

## 2022-04-12 NOTE — Progress Notes (Addendum)
Occupational Therapy Treatment Patient Details Name: Amber [name] MRN: [id number] DOB: 1941/05/16 Today's Date: 04/12/2022   History of present illness Pt is a 81 y/o female presenting on 04/08/22 with AMS. Recently treated for UTI. Admitted with sepsis. CT abdomen with concern for pyleonephritis around L kidney. Cardiac cath 11/13. PMH includes: arthritis, DVT, HTN, macular degeneration, PVD, bil shoulder surgery, TKR, chronic back pain   OT comments  Pt progressing towards goals, able to complete toilet transfer and standing grooming task at sink with min guard-minA. Pt c/o L hip pain with mobility, however able to walk short hallway distance with RW. Pt presenting with impairments listed below, will follow acutely. Continue to recommend SNF at d/c.   Recommendations for follow up therapy are one component of a multi-disciplinary discharge planning process, led by the attending physician.  Recommendations may be updated based on patient status, additional functional criteria and insurance authorization.    Follow Up Recommendations  Skilled nursing-short term rehab (<3 hours/day)     Assistance Recommended at Discharge Frequent or constant Supervision/Assistance  Patient can return home with the following  A little help with walking and/or transfers;A lot of help with bathing/dressing/bathroom;Assistance with cooking/housework;Help with stairs or ramp for entrance;Assist for transportation   Equipment Recommendations  None recommended by OT (defer)    Recommendations for Other Services      Precautions / Restrictions Precautions Precautions: Fall Precaution Comments: R wrist cath 11/13 Restrictions Weight Bearing Restrictions: No       Mobility Bed Mobility Overal bed mobility: Needs Assistance Bed Mobility: Supine to Sit     Supine to sit: Min guard          Transfers Overall transfer level: Needs assistance Equipment used: Rolling walker (2 wheels) Transfers:  Sit to/from Stand Sit to Stand: Min guard, Min assist                 Balance Overall balance assessment: Needs assistance Sitting-balance support: No upper extremity supported, Feet supported, Single extremity supported Sitting balance-Leahy Scale: Fair Sitting balance - Comments: static and dynamic sitting EOB during clothing mgmt with no LOB   Standing balance support: Bilateral upper extremity supported, During functional activity Standing balance-Leahy Scale: Poor Standing balance comment: with reliant on RW for B UE support                           ADL either performed or assessed with clinical judgement   ADL Overall ADL's : Needs assistance/impaired     Grooming: Wash/dry hands;Standing;Min guard Grooming Details (indicate cue type and reason): standing at sink             Lower Body Dressing: Min guard Lower Body Dressing Details (indicate cue type and reason): pulling up mesh underwear from knee-level Toilet Transfer: Min guard;Ambulation;Rolling walker (2 wheels);Regular Toilet   Toileting- Clothing Manipulation and Hygiene: Supervision/safety Toileting - Clothing Manipulation Details (indicate cue type and reason): pericare     Functional mobility during ADLs: Min guard;Rolling walker (2 wheels)      Extremity/Trunk Assessment Upper Extremity Assessment Upper Extremity Assessment: Generalized weakness (hx of Bil shoulder sx, limited flexion R shoulder)   Lower Extremity Assessment Lower Extremity Assessment: Defer to PT evaluation        Vision   Vision Assessment?: No apparent visual deficits   Perception     Praxis      Cognition Arousal/Alertness: Awake/alert Behavior During Therapy: WFL for tasks assessed/performed Overall  Cognitive Status: Within Functional Limits for tasks assessed                                          Exercises      Shoulder Instructions       General Comments VSS on RA     Pertinent Vitals/ Pain       Pain Assessment Pain Assessment: Faces Pain Score: 5  Faces Pain Scale: Hurts even more Pain Location: L hip Pain Descriptors / Indicators: Discomfort Pain Intervention(s): Limited activity within patient's tolerance, Monitored during session, Repositioned  Home Living                                          Prior Functioning/Environment              Frequency  Min 2X/week        Progress Toward Goals  OT Goals(current goals can now be found in the care plan section)  Progress towards OT goals: Progressing toward goals  Acute Rehab OT Goals Patient Stated Goal: none stated OT Goal Formulation: With patient Time For Goal Achievement: 04/23/22 Potential to Achieve Goals: Good ADL Goals Pt Will Perform Grooming: with supervision;standing Pt Will Perform Lower Body Dressing: with supervision;with adaptive equipment;sit to/from stand Pt Will Transfer to Toilet: with supervision;ambulating Pt Will Perform Toileting - Clothing Manipulation and hygiene: with supervision;sit to/from stand Pt/caregiver will Perform Home Exercise Program: Both right and left upper extremity;With written HEP provided;Independently;Increased strength  Plan Discharge plan remains appropriate;Frequency remains appropriate    Co-evaluation                 AM-PAC OT "6 Clicks" Daily Activity     Outcome Measure   Help from another person eating meals?: None Help from another person taking care of personal grooming?: A Little Help from another person toileting, which includes using toliet, bedpan, or urinal?: A Little Help from another person bathing (including washing, rinsing, drying)?: A Lot Help from another person to put on and taking off regular upper body clothing?: A Little Help from another person to put on and taking off regular lower body clothing?: A Lot 6 Click Score: 17    End of Session Equipment Utilized During  Treatment: Gait belt;Rolling walker (2 wheels)  OT Visit Diagnosis: Unsteadiness on feet (R26.81);Other abnormalities of gait and mobility (R26.89);Muscle weakness (generalized) (M62.81)   Activity Tolerance Patient tolerated treatment well   Patient Left in chair;with call bell/phone within reach;with chair alarm set;with family/visitor present;with nursing/sitter in room   Nurse Communication Mobility status        Time: 2694-8546 OT Time Calculation (min): 28 min  Charges: OT General Charges $OT Visit: 1 Visit OT Treatments $Self Care/Home Management : 8-22 mins $Therapeutic Activity: 8-22 mins  Alfonzo Beers, OTD, OTR/L Acute Rehab [phone]   Amber [name] 04/12/2022, 4:42 PM

## 2022-04-12 NOTE — [place] FL2 (Signed)
[place] MEDICAID FL2 LEVEL OF CARE SCREENING TOOL     IDENTIFICATION  Patient Name: Amber [name] Birthdate: 1940/07/15 Sex: female Admission Date (Current Location): 04/08/2022  [facility] and [place] Number:  Producer, television/film/video and Address:  [facility], [address], [place], [place] [address]      Provider Number: [id number]  Attending Physician Name and Address:  Merlene Laughter, DO  Relative Name and Phone Number:       Current Level of Care: Hospital Recommended Level of Care: Skilled Nursing Facility Prior Approval Number:    Date Approved/Denied:   PASRR Number: [id number]  Discharge Plan: SNF    Current Diagnoses: Patient Active Problem List   Diagnosis Date Noted   Chest pain of uncertain etiology 04/10/2022   NSTEMI (non-ST elevated myocardial infarction) (HCC) 04/09/2022   Urinary tract infection with hematuria 04/08/2022   Severe sepsis (HCC) 04/08/2022   Transient hypotension 04/08/2022   Acute metabolic encephalopathy 04/08/2022   Cellulitis, face 04/08/2022   Elevated troponin 04/08/2022   AKI (acute kidney injury) (HCC) 04/08/2022   Normocytic anemia 04/08/2022   Thrombocytopenia (HCC) 04/08/2022   Sepsis (HCC) 04/08/2022   Lumbosacral radiculopathy at S1 12/06/2020   Hyponatremia 12/06/2020   Mixed hyperlipidemia 12/06/2020   Osteopenia after menopause 12/06/2020   Class 1 obesity due to excess calories in adult 03/29/2020   Prediabetes 03/18/2020   Laryngopharyngeal reflux (LPR) 03/11/2019   Gastroesophageal reflux disease 01/19/2016   Hoarseness, chronic 12/28/2015   Complete left bundle branch block 12/08/2015   Essential hypertension 12/08/2015   Acquired hypothyroidism 12/08/2015   Peripheral vascular disease, unspecified (HCC) 04/18/2013   Chronic venous insufficiency 04/18/2013    Orientation RESPIRATION BLADDER Height & Weight     Self, Time, Situation, Place  O2 External catheter, Incontinent  Weight: 197 lb 12 oz (89.7 kg) Height:  5\' 2"  (157.5 cm)  BEHAVIORAL SYMPTOMS/MOOD NEUROLOGICAL BOWEL NUTRITION STATUS      Continent Diet (please see discharge summary)  AMBULATORY STATUS COMMUNICATION OF NEEDS Skin   Limited Assist Verbally Normal                       Personal Care Assistance Level of Assistance  Bathing, Feeding, Dressing Bathing Assistance: Limited assistance Feeding assistance: Limited assistance Dressing Assistance: Limited assistance     Functional Limitations Info  Sight, Hearing, Speech Sight Info: Impaired Hearing Info: Impaired Speech Info: Adequate    SPECIAL CARE FACTORS FREQUENCY  PT (By licensed PT), OT (By licensed OT)     PT Frequency: 5x per week OT Frequency: 5x per week            Contractures Contractures Info: Not present    Additional Factors Info  Code Status, Allergies Code Status Info: FULL Allergies Info: Codeine,Darvocet,Vicodin,Nickel,Sulfa antibiotics           Current Medications (04/12/2022):  This is the current hospital active medication list Current Facility-Administered Medications  Medication Dose Route Frequency Provider Last Rate Last Admin   0.9 %  sodium chloride infusion  250 mL Intravenous Continuous Olalere, Adewale A, MD 20 mL/hr at 04/08/22 2000 Infusion Verify at 04/08/22 2000   0.9 %  sodium chloride infusion  250 mL Intravenous PRN 13/11/23, MD       acetaminophen (TYLENOL) tablet 650 mg  650 mg Oral Q4H PRN Lennette Bihari, MD       albuterol (PROVENTIL) (2.5 MG/3ML) 0.083% nebulizer solution 2.5  mg  2.5 mg Nebulization Q6H PRN Madelyn Flavors A, MD   2.5 mg at 04/10/22 0556   aspirin EC tablet 81 mg  81 mg Oral Daily Olalere, Adewale A, MD   81 mg at 04/12/22 0812   atorvastatin (LIPITOR) tablet 20 mg  20 mg Oral Daily Katrinka Blazing, Rondell A, MD   20 mg at 04/12/22 4332   ceFAZolin (ANCEF) IVPB 2g/100 mL premix  2 g Intravenous Q8H Dahal, Melina Schools, MD 200 mL/hr at 04/12/22 1332 2 g at 04/12/22  1332   Chlorhexidine Gluconate Cloth 2 % PADS 6 each  6 each Topical Q0600 Olalere, Adewale A, MD   6 each at 04/10/22 0523   clobetasol ointment (TEMOVATE) 0.05 % 1 Application  1 Application Topical Daily PRN Smith, Rondell A, MD       docusate sodium (COLACE) capsule 100 mg  100 mg Oral BID PRN Olalere, Adewale A, MD       enoxaparin (LOVENOX) injection 40 mg  40 mg Subcutaneous Q24H Lennette Bihari, MD   40 mg at 04/12/22 9518   gabapentin (NEURONTIN) capsule 100 mg  100 mg Oral TID Madelyn Flavors A, MD   100 mg at 04/12/22 1559   levothyroxine (SYNTHROID) tablet 50 mcg  50 mcg Oral Q0600 Madelyn Flavors A, MD   50 mcg at 04/12/22 0700   metoprolol tartrate (LOPRESSOR) tablet 25 mg  25 mg Oral BID Olalere, Adewale A, MD   25 mg at 04/12/22 0812   nitroGLYCERIN (NITROSTAT) SL tablet 0.4 mg  0.4 mg Sublingual Q5 min PRN Aundra Dubin, MD   0.4 mg at 04/10/22 0548   nystatin (MYCOSTATIN/NYSTOP) topical powder 1 Application  1 Application Topical Daily PRN Clydie Braun, MD       ondansetron (ZOFRAN) injection 4 mg  4 mg Intravenous Q6H PRN Lennette Bihari, MD       ondansetron [facility]) tablet 4 mg  4 mg Oral Q6H PRN Clydie Braun, MD       Oral care mouth rinse  15 mL Mouth Rinse PRN Olalere, Adewale A, MD       oxyCODONE (Oxy IR/ROXICODONE) immediate release tablet 5 mg  5 mg Oral Q6H Olalere, Adewale A, MD   5 mg at 04/12/22 0706   pantoprazole (PROTONIX) EC tablet 40 mg  40 mg Oral Daily Smith, Rondell A, MD   40 mg at 04/12/22 0813   polyethylene glycol (MIRALAX / GLYCOLAX) packet 17 g  17 g Oral Daily PRN Olalere, Adewale A, MD       polyvinyl alcohol (LIQUIFILM TEARS) 1.4 % ophthalmic solution 1 drop  1 drop Both Eyes Daily PRN Smith, Rondell A, MD       sodium chloride flush (NS) 0.9 % injection 3 mL  3 mL Intravenous Q12H Smith, Rondell A, MD   3 mL at 04/11/22 0907   sodium chloride flush (NS) 0.9 % injection 3 mL  3 mL Intravenous Q12H Nada Boozer R, NP   3 mL at 04/11/22 0907    sodium chloride flush (NS) 0.9 % injection 3 mL  3 mL Intravenous Q12H Lennette Bihari, MD   3 mL at 04/11/22 2239   sodium chloride flush (NS) 0.9 % injection 3 mL  3 mL Intravenous PRN Lennette Bihari, MD         Discharge Medications: Please see discharge summary for a list of discharge medications.  Relevant Imaging Results:  Relevant Lab Results:   Additional Information  SSN [id number]  Eduard Roux, LCSW

## 2022-04-12 NOTE — Progress Notes (Signed)
Mobility Specialist Progress Note:   04/12/22 1235  Mobility  Activity Ambulated with assistance in hallway  Level of Assistance Minimal assist, patient does 75% or more  Assistive Device Front wheel walker  Distance Ambulated (ft) 200 ft  Activity Response Tolerated well  Mobility Referral Yes  $Mobility charge 1 Mobility   Pt received in chair willing to participate in mobility. Complaints of L hip pain. Left in bed with call bell in reach and all needs met.   Gareth Eagle Arlando Leisinger Mobility Specialist Please contact via [employer] or  Rehab Office at [phone]

## 2022-04-13 DIAGNOSIS — I959 Hypotension, unspecified: Secondary | ICD-10-CM | POA: Diagnosis not present

## 2022-04-13 DIAGNOSIS — N390 Urinary tract infection, site not specified: Secondary | ICD-10-CM | POA: Diagnosis not present

## 2022-04-13 DIAGNOSIS — B9620 Unspecified Escherichia coli [E. coli] as the cause of diseases classified elsewhere: Secondary | ICD-10-CM

## 2022-04-13 DIAGNOSIS — A419 Sepsis, unspecified organism: Secondary | ICD-10-CM | POA: Diagnosis not present

## 2022-04-13 DIAGNOSIS — R7989 Other specified abnormal findings of blood chemistry: Secondary | ICD-10-CM | POA: Diagnosis not present

## 2022-04-13 LAB — CBC WITH DIFFERENTIAL/PLATELET
Abs Immature Granulocytes: 0 10*3/uL (ref 0.00–0.07)
Basophils Absolute: 0 10*3/uL (ref 0.0–0.1)
Basophils Relative: 0 %
Eosinophils Absolute: 0 10*3/uL (ref 0.0–0.5)
Eosinophils Relative: 0 %
HCT: 36 % (ref 36.0–46.0)
Hemoglobin: 12.5 g/dL (ref 12.0–15.0)
Lymphocytes Relative: 12 %
Lymphs Abs: 2.6 10*3/uL (ref 0.7–4.0)
MCH: 31.3 pg (ref 26.0–34.0)
MCHC: 34.7 g/dL (ref 30.0–36.0)
MCV: 90.2 fL (ref 80.0–100.0)
Monocytes Absolute: 1.3 10*3/uL — ABNORMAL HIGH (ref 0.1–1.0)
Monocytes Relative: 6 %
Neutro Abs: 17.6 10*3/uL — ABNORMAL HIGH (ref 1.7–7.7)
Neutrophils Relative %: 82 %
Platelets: 142 10*3/uL — ABNORMAL LOW (ref 150–400)
RBC: 3.99 MIL/uL (ref 3.87–5.11)
RDW: 14.3 % (ref 11.5–15.5)
WBC: 21.5 10*3/uL — ABNORMAL HIGH (ref 4.0–10.5)
nRBC: 0.1 % (ref 0.0–0.2)

## 2022-04-13 LAB — CULTURE, BLOOD (ROUTINE X 2): Culture: NO GROWTH

## 2022-04-13 LAB — COMPREHENSIVE METABOLIC PANEL
ALT: 80 U/L — ABNORMAL HIGH (ref 0–44)
AST: 95 U/L — ABNORMAL HIGH (ref 15–41)
Albumin: 2.5 g/dL — ABNORMAL LOW (ref 3.5–5.0)
Alkaline Phosphatase: 80 U/L (ref 38–126)
Anion gap: 10 (ref 5–15)
BUN: 30 mg/dL — ABNORMAL HIGH (ref 8–23)
CO2: 24 mmol/L (ref 22–32)
Calcium: 8.6 mg/dL — ABNORMAL LOW (ref 8.9–10.3)
Chloride: 103 mmol/L (ref 98–111)
Creatinine, Ser: 0.78 mg/dL (ref 0.44–1.00)
GFR, Estimated: >60 mL/min (ref >60)
Glucose, Bld: 128 mg/dL — ABNORMAL HIGH (ref 70–99)
Potassium: 4.1 mmol/L (ref 3.5–5.1)
Sodium: 137 mmol/L (ref 135–145)
Total Bilirubin: 0.8 mg/dL (ref 0.3–1.2)
Total Protein: 5.2 g/dL — ABNORMAL LOW (ref 6.5–8.1)

## 2022-04-13 LAB — MAGNESIUM: Magnesium: 1.8 mg/dL (ref 1.7–2.4)

## 2022-04-13 LAB — PHOSPHORUS: Phosphorus: 3.2 mg/dL (ref 2.5–4.6)

## 2022-04-13 MED ORDER — POLYETHYLENE GLYCOL 3350 17 G PO PACK
17.0000 g | PACK | Freq: Two times a day (BID) | ORAL | Status: DC
  Administered 2022-04-13 – 2022-04-15 (×4): 17 g via ORAL
  Filled 2022-04-13 (×5): qty 1

## 2022-04-13 MED ORDER — SENNOSIDES-DOCUSATE SODIUM 8.6-50 MG PO TABS
1.0000 | ORAL_TABLET | Freq: Two times a day (BID) | ORAL | Status: DC
  Administered 2022-04-13 – 2022-04-16 (×6): 1 via ORAL
  Filled 2022-04-13 (×6): qty 1

## 2022-04-13 MED ORDER — BISACODYL 5 MG PO TBEC
10.0000 mg | DELAYED_RELEASE_TABLET | Freq: Every day | ORAL | Status: DC | PRN
  Administered 2022-04-15: 10 mg via ORAL
  Filled 2022-04-13: qty 2

## 2022-04-13 MED ORDER — MAGNESIUM SULFATE 2 GM/50ML IV SOLN
2.0000 g | Freq: Once | INTRAVENOUS | Status: AC
  Administered 2022-04-13: 2 g via INTRAVENOUS
  Filled 2022-04-13: qty 50

## 2022-04-13 MED ORDER — OXYCODONE HCL 5 MG PO TABS
5.0000 mg | ORAL_TABLET | ORAL | Status: DC | PRN
  Administered 2022-04-13 – 2022-04-16 (×8): 5 mg via ORAL
  Administered 2022-04-16: 10 mg via ORAL
  Filled 2022-04-13: qty 1
  Filled 2022-04-13: qty 2
  Filled 2022-04-13: qty 1
  Filled 2022-04-13: qty 2
  Filled 2022-04-13 (×5): qty 1

## 2022-04-13 NOTE — TOC Progression Note (Addendum)
Transition of Care [facility]) - Progression Note    Patient Details  Name: Amber [name] MRN: [id number] Date of Birth: 07/26/1940  [facility] CM/SW Contact  Eduard Roux, [place] Phone Number: 04/13/2022, 2:21 PM  Clinical Narrative:     2:34pm-UPDATE:- [facility] confirmed the can admit patient tomorrow if remains medically stable.   2:21pm-Received call from patient's son,Roy- SNF choices are: [facility], [facility] and [facility]. Sent [facility]- waiting on response  Expected Discharge Plan: Skilled Nursing Facility Barriers to Discharge: Continued Medical Work up, Conservator, museum/gallery and Services Expected Discharge Plan: Skilled Nursing Facility In-house Referral: Clinical Social Work     Living arrangements for the past 2 months: Single Family Home                                       Social Determinants of Health (SDOH) Interventions    Readmission Risk Interventions     No data to display

## 2022-04-13 NOTE — Plan of Care (Signed)
  Problem: Health Behavior/Discharge Planning: Goal: Ability to manage health-related needs will improve Outcome: Progressing   Problem: Clinical Measurements: Goal: Respiratory complications will improve Outcome: Progressing   

## 2022-04-13 NOTE — Care Management Important Message (Signed)
Important Message  Patient Details  Name: Amber [name] MRN: [id number] Date of Birth: 1940/10/20   Medicare Important Message Given:  Yes     [name] 04/13/2022, 8:14 AM

## 2022-04-13 NOTE — Progress Notes (Signed)
PROGRESS NOTE    Amber [name]  TMH:[id number] DOB: 1941/03/01 DOA: 04/08/2022 PCP: Leamon Arnt, MD   Brief Narrative:  Amber [name] is a 81 y.o. female with PMH significant for HTN, HLD, PAD, DVT, arthritis, hypothyroidism, varicose vein, rotator cuff rupture who lives alone at home, recently treated for UTI. 11/11, patient was brought to the ED from home with urinary incontinence, frequency, altered mental status. EMS noted a low blood pressure at 70/50 which improved with normal saline hydration.   In the ED, patient had a temperature of 100.3, tachycardia, tachypnea. Labs with WC count elevated to 33.7, creatinine 1.63, lactic acid elevated to 3.3 CT of the head and x-ray chest were unremarkable. Urinalysis showed large leukocytes, positive nitrites, many bacteria CT abdomen pelvis showed left pyelonephritis, no obstructing calculi. Patient was started on IV antibiotics, IV fluids Admitted to [facility] service for sepsis due to acute left pyelonephritis Within few hours of admission, patient's blood pressure started to drop again and hence she was transferred to ICU for septic shock. Urine culture and blood culture sent on admission grew E. coli. 11/13, transferred out to [facility].  Assessment and Plan:  Septic Shock - POA Acute left Pyelonephritis E. coli UTI and Bacteremia -Patient met sepsis criteria on admission.  Within few hours, blood pressure dropped significantly and hence transferred to ICU for septic shock and started on pressors.   -Eventually pressors weaned down.  Currently blood pressure is elevated. -Urine culture and blood culture sent on admission grew E. coli. -Initially started on IV Rocephin.  Switched to IV Ancef 04/11/22.   -No fever.   -WBC went from 35.2 -> 25.3 -> 19.6 and was trending down but then started trending back up is now 21.5.   -Lactic acid level normalized and is now 1.9 -Continue to monitor labs.    Last Labs                        Recent  Labs  Lab 04/08/22 0224 04/08/22 0413 04/08/22 0859 04/08/22 1308 04/08/22 1606 04/09/22 0413 04/09/22 1321 04/10/22 0052 04/11/22 0123  WBC 33.7*  --   --   --  36.7* 37.0*  --  35.2* 25.3*  LATICACIDVEN 3.3* 2.6* 2.6* 2.8*  --   --  2.8*  --  1.9    -Continue to Monitor and Trend WBC and went from 19.6 yesterday to 21.5 and about the same but will need to continue monitor -PT/OT Recommending SNF and has a bed available for tomorrow   NSTEMI Nonobstructive CAD -Patient reported several weeks of exertional angina and dyspnea -10/12, during ambulation, she had recurrence of symptoms.  Symptoms improved with morphine IV.   -Troponin was elevated, peaked at 308.  Trend as below. -Patient was started on IV heparin drip and now stopped  -11/13, underwent cardiac cath.  Found to have mild nonobstructive CAD. -Continue Aspirin 81 mg po Daily, Atorvastatin 20 mg po Daily  and Metoprolol Tartrate 25 mg po BID and NTG 0.4 mg SL q19mnprn. Recent Labs (last 2 labs)           Recent Labs    04/09/22 1614 04/09/22 1922 04/10/22 0052  TROPONINIHS 243* 308* 192*    -She will need cardiology follow-up in outpatient setting within 1 to 2 weeks  Abnormal LFTs -In the setting of above -Likely had some hypoperfusion to the liver -May need to hold her statin but will need to continue monitoring AST is  now 95 and ALT is now 80 -Okay to monitor and trend and repeat CMP in a.m. and if necessary will obtain a right upper quadrant ultrasound and acute hepatitis panel -Repeat CMP in the a.m.  Hypertension -PTA on losartan 50 mg daily, HCTZ 12.5 mg daily -BP meds were held because of septic shock.  Blood pressure improving, in 140s this morning. -Currently on Metoprolol 25 mg twice daily.   -Continue to monitor BP per Protocol and last BP was slightly elevated at 172/85 yesterday and today is now 130/58   Hypoalbuminemia  -Patient's Albumin Level is now 2.5 -Continue to Monitor and Trend and  repeat CMP in the AM   History of PAD -As above C/w Aspirin, statin and metoprolol.   Hypothyroidism -C/w Levothyroxine 50 mcg po Daily   Chronic Back Pain Lumbar radiculopathy Chronic Hip Pain  -PTA on Neurontin 100 mg 3 times daily, oxycodone 5 mg q6h as needed, Flexeril 10 mg 3 times daily as needed -Resumed all home meds but will need to make adjustments given Pain in bursa limiting Movement will increase oxycodone from 5 mg every 6hprn to 5 to 10 mg every 4 as needed -Recently got an injection in her Left and Right Hip bursa    Mild chronic Anemia -Patient's Hgb/Hct went from 10.6/30.1 -> 11.3/32.0 -> 11.4/32.4 -> 12.5/36.0 -Check Anemia Panel in the AM -Continue to Monitor for S/Sx of Bleeding; No overt bleeding noted -Repeat CBC in the AM   Constipation -Start bowel regimen with MiraLAX 17 g p.o. twice daily, senna docusate 1 tab p.o. twice daily and bisacodyl 10 mg as needed daily  Obesity -Complicates overall prognosis and care -Estimated body mass index is 33.32 kg/m as calculated from the following:   Height as of this encounter: _0  (1.575 m).   Weight as of this encounter: 82.6 kg.  -Weight Loss and Dietary Counseling given  DVT prophylaxis: enoxaparin (LOVENOX) injection 40 mg Start: 04/11/22 0800 SCD's Start: 04/10/22 1717 SCDs Start: 04/08/22 1447    Code Status: Full Code Family Communication: Discussed with sister, son, and daughter at bedside   Disposition Plan:  Level of care: Progressive Status is: Inpatient Remains inpatient appropriate because: She needs SNF placement and currently needs insurance authorization and likely will be done tomorrow   Consultants:  PCCM transfer Cardiology  Procedures:  As delineated as above   CARDIAC CATH   Mid LAD lesion is 20% stenosed.   Mid Cx lesion is 30% stenosed.   Mid Cx to Dist Cx lesion is 20% stenosed.   Prox RCA-1 lesion is 30% stenosed.   Prox RCA-2 lesion is 20% stenosed.   Mid RCA lesion is  20% stenosed.   Mild nonobstructive CAD with a percent mid LAD stenosis; 30% AV groove circumflex stenosis; and 20 to 30% RCA stenosis in a dominant vessel.   There is moderate mitral annular calcification.   Elevated LVEDP at 24 mm.   RECOMMENDATION: Aspirin 81 mg.  Medical therapy for CAD.  Antimicrobials:  Anti-infectives (From admission, onward)    Start     Dose/Rate Route Frequency Ordered Stop   04/11/22 0600  ceFAZolin (ANCEF) IVPB 2g/100 mL premix        2 g 200 mL/hr over 30 Minutes Intravenous Every 8 hours 04/10/22 1136     04/09/22 0600  cefTRIAXone (ROCEPHIN) 2 g in sodium chloride 0.9 % 100 mL IVPB  Status:  Discontinued        2 g 200 mL/hr over  30 Minutes Intravenous Every 24 hours 04/08/22 1204 04/10/22 1136   04/08/22 0630  amoxicillin-clavulanate (AUGMENTIN) 875-125 MG per tablet 1 tablet        1 tablet Oral  Once 04/08/22 0618 04/08/22 0636   04/08/22 0600  cefTRIAXone (ROCEPHIN) 2 g in sodium chloride 0.9 % 100 mL IVPB        2 g 200 mL/hr over 30 Minutes Intravenous  Once 04/08/22 0549 04/08/22 0858       Subjective: Seen and examined at bedside and she is sitting up in the bed still complaining about some left hip bursitis pain.  No nausea or vomiting.  States that she is feeling okay.  States that the pain is limiting her mobility.  Denies any other concerns or complaints at this time.  Objective: Vitals:   04/13/22 0350 04/13/22 0640 04/13/22 0821 04/13/22 1143  BP: (!) 160/76  (!) 151/80 (!) 130/58  Pulse: 85  79 77  Resp: _0 Temp: 98.5 F (36.9 C)  98.5 F (36.9 C) 97.6 F (36.4 C)  TempSrc: Oral  Oral Oral  SpO2: 96%  96% 96%  Weight:  82.6 kg    Height:        Intake/Output Summary (Last 24 hours) at 04/13/2022 1512 Last data filed at 04/13/2022 1300 Gross per 24 hour  Intake 1777 ml  Output 1500 ml  Net 277 ml   Filed Weights   04/09/22 0500 04/10/22 0500 04/13/22 0640  Weight: 80.1 kg 89.7 kg 82.6 kg    Examination: Physical Exam:  Constitutional: WN/WD obese Caucasian female currently no acute distress sitting up in the bed Respiratory: Clear to auscultation bilaterally, no wheezing, rales, rhonchi or crackles. Normal respiratory effort and patient is not tachypenic. No accessory muscle use.  Cardiovascular: RRR, no murmurs / rubs / gallops. S1 and S2 auscultated. No extremity edema. Abdomen: Soft, non-tender, distended secondary body habitus.  GU: Deferred. Musculoskeletal: No clubbing / cyanosis of digits/nails. No joint deformity upper and lower extremities.  Skin: No rashes, lesions, ulcers on limited skin evaluation. No induration; Warm and dry.  Neurologic: CN 2-12 grossly intact with no focal deficits. Romberg sign and cerebellar reflexes not assessed.  Psychiatric: Normal judgment and insight. Alert and oriented x 3. Normal mood and appropriate affect.   Data Reviewed: I have personally reviewed following labs and imaging studies  CBC: Recent Labs  Lab 04/08/22 0224 04/08/22 0859 04/09/22 0413 04/10/22 0052 04/11/22 0123 04/12/22 0138 04/13/22 0112  WBC 33.7*   < > 37.0* 35.2* 25.3* 19.6* 21.5*  NEUTROABS 28.4*  --   --   --   --  15.1* 17.6*  HGB 11.4*   < > 10.5* 10.6* 11.3* 11.4* 12.5  HCT 33.2*   < > 30.1* 30.1* 32.0* 32.4* 36.0  MCV 93.5   < > 92.3 92.0 90.9 89.8 90.2  PLT 146*   < > 112* 106* 110* 114* 142*   < > = values in this interval not displayed.   Basic Metabolic Panel: Recent Labs  Lab 04/08/22 0224 04/08/22 1606 04/09/22 0413 04/11/22 0123 04/12/22 0138 04/13/22 0112  NA 136  --  136 138 137 137  K 3.3*  --  4.2 3.8 3.9 4.1  CL 102  --  103 107 107 103  CO2 24  --  18* 20* 23 24  GLUCOSE 165*  --  122* 167* 126* 128*  BUN 28*  --  31* 29* 34* 30*  CREATININE 1.63*  1.71* 1.09* 0.81 0.80 0.78  CALCIUM 9.0  --  8.2* 8.4* 8.3* 8.6*  MG 1.9  --   --   --   --  1.8  PHOS  --   --   --   --   --  3.2   GFR: Estimated Creatinine Clearance:  53.1 mL/min (by C-G formula based on SCr of 0.78 mg/dL). Liver Function Tests: Recent Labs  Lab 04/08/22 0224 04/13/22 0112  AST 36 95*  ALT 28 80*  ALKPHOS 58 80  BILITOT 0.9 0.8  PROT 5.6* 5.2*  ALBUMIN 3.3* 2.5*   No results for input(s): "LIPASE", "AMYLASE" in the last 168 hours. No results for input(s): "AMMONIA" in the last 168 hours. Coagulation Profile: No results for input(s): "INR", "PROTIME" in the last 168 hours. Cardiac Enzymes: No results for input(s): "CKTOTAL", "CKMB", "CKMBINDEX", "TROPONINI" in the last 168 hours. BNP (last 3 results) No results for input(s): "PROBNP" in the last 8760 hours. HbA1C: No results for input(s): "HGBA1C" in the last 72 hours. CBG: No results for input(s): "GLUCAP" in the last 168 hours. Lipid Profile: No results for input(s): "CHOL", "HDL", "LDLCALC", "TRIG", "CHOLHDL", "LDLDIRECT" in the last 72 hours. Thyroid Function Tests: No results for input(s): "TSH", "T4TOTAL", "FREET4", "T3FREE", "THYROIDAB" in the last 72 hours. Anemia Panel: No results for input(s): "VITAMINB12", "FOLATE", "FERRITIN", "TIBC", "IRON", "RETICCTPCT" in the last 72 hours. Sepsis Labs: Recent Labs  Lab 04/08/22 0859 04/08/22 1308 04/09/22 1321 04/11/22 0123  LATICACIDVEN 2.6* 2.8* 2.8* 1.9    Recent Results (from the past 240 hour(s))  Urine Culture     Status: Abnormal   Collection Time: 04/08/22  5:28 AM   Specimen: Urine, Catheterized  Result Value Ref Range Status   Specimen Description URINE, CATHETERIZED  Final   Special Requests   Final    NONE Performed at [facility], [place] [address] [place], [place] [address]    Culture >=100,000 COLONIES/mL ESCHERICHIA COLI (A)  Final   Report Status 04/10/2022 FINAL  Final   Organism ID, Bacteria ESCHERICHIA COLI (A)  Final      Susceptibility   Escherichia coli - MIC*    AMPICILLIN <=2 SENSITIVE Sensitive     CEFAZOLIN <=4 SENSITIVE Sensitive     CEFEPIME <=0.12 SENSITIVE Sensitive      CEFTRIAXONE <=0.25 SENSITIVE Sensitive     CIPROFLOXACIN <=0.25 SENSITIVE Sensitive     GENTAMICIN <=1 SENSITIVE Sensitive     IMIPENEM <=0.25 SENSITIVE Sensitive     NITROFURANTOIN <=16 SENSITIVE Sensitive     TRIMETH/SULFA <=20 SENSITIVE Sensitive     AMPICILLIN/SULBACTAM <=2 SENSITIVE Sensitive     PIP/TAZO <=4 SENSITIVE Sensitive     * >=100,000 COLONIES/mL ESCHERICHIA COLI  Blood culture (routine x 2)     Status: Abnormal   Collection Time: 04/08/22  6:40 AM   Specimen: BLOOD  Result Value Ref Range Status   Specimen Description BLOOD RIGHT ANTECUBITAL  Final   Special Requests   Final    BOTTLES DRAWN AEROBIC AND ANAEROBIC Blood Culture adequate volume   Culture  Setup Time   Final    IN BOTH AEROBIC AND ANAEROBIC BOTTLES GRAM NEGATIVE RODS Organism ID to follow CRITICAL RESULT CALLED TO, READ BACK BY AND VERIFIED WITH:  C/ PHARMD L. CHEN 04/08/22 1920 A. LAFRANCE  Performed at [facility], [place] [address], [place], [place] [address]    Culture ESCHERICHIA COLI (A)  Final   Report Status 04/10/2022 FINAL  Final   Organism ID, Bacteria ESCHERICHIA COLI  Final      Susceptibility   Escherichia coli - MIC*    AMPICILLIN <=2 SENSITIVE Sensitive     CEFAZOLIN <=4 SENSITIVE Sensitive     CEFEPIME <=0.12 SENSITIVE Sensitive     CEFTAZIDIME <=1 SENSITIVE Sensitive     CEFTRIAXONE <=0.25 SENSITIVE Sensitive     CIPROFLOXACIN <=0.25 SENSITIVE Sensitive     GENTAMICIN <=1 SENSITIVE Sensitive     IMIPENEM <=0.25 SENSITIVE Sensitive     TRIMETH/SULFA <=20 SENSITIVE Sensitive     AMPICILLIN/SULBACTAM <=2 SENSITIVE Sensitive     PIP/TAZO <=4 SENSITIVE Sensitive     * ESCHERICHIA COLI  Blood Culture ID Panel (Reflexed)     Status: Abnormal   Collection Time: 04/08/22  6:40 AM  Result Value Ref Range Status   Enterococcus faecalis NOT DETECTED NOT DETECTED Final   Enterococcus Faecium NOT DETECTED NOT DETECTED Final   Listeria monocytogenes NOT DETECTED NOT DETECTED Final    Staphylococcus species NOT DETECTED NOT DETECTED Final   Staphylococcus aureus (BCID) NOT DETECTED NOT DETECTED Final   Staphylococcus epidermidis NOT DETECTED NOT DETECTED Final   Staphylococcus lugdunensis NOT DETECTED NOT DETECTED Final   Streptococcus species NOT DETECTED NOT DETECTED Final   Streptococcus agalactiae NOT DETECTED NOT DETECTED Final   Streptococcus pneumoniae NOT DETECTED NOT DETECTED Final   Streptococcus pyogenes NOT DETECTED NOT DETECTED Final   A.calcoaceticus-baumannii NOT DETECTED NOT DETECTED Final   Bacteroides fragilis NOT DETECTED NOT DETECTED Final   Enterobacterales DETECTED (A) NOT DETECTED Final    Comment: Enterobacterales represent a large order of gram negative bacteria, not a single organism. CRITICAL RESULT CALLED TO, READ BACK BY AND VERIFIED WITH:  C/ PHARMD L. CHEN 04/08/22 1920 A. LAFRANCE     Enterobacter cloacae complex NOT DETECTED NOT DETECTED Final   Escherichia coli DETECTED (A) NOT DETECTED Final    Comment: CRITICAL RESULT CALLED TO, READ BACK BY AND VERIFIED WITH:  C/ PHARMD L. CHEN 04/08/22 1920 A. LAFRANCE     Klebsiella aerogenes NOT DETECTED NOT DETECTED Final   Klebsiella oxytoca NOT DETECTED NOT DETECTED Final   Klebsiella pneumoniae NOT DETECTED NOT DETECTED Final   Proteus species NOT DETECTED NOT DETECTED Final   Salmonella species NOT DETECTED NOT DETECTED Final   Serratia marcescens NOT DETECTED NOT DETECTED Final   Haemophilus influenzae NOT DETECTED NOT DETECTED Final   Neisseria meningitidis NOT DETECTED NOT DETECTED Final   Pseudomonas aeruginosa NOT DETECTED NOT DETECTED Final   Stenotrophomonas maltophilia NOT DETECTED NOT DETECTED Final   Candida albicans NOT DETECTED NOT DETECTED Final   Candida auris NOT DETECTED NOT DETECTED Final   Candida glabrata NOT DETECTED NOT DETECTED Final   Candida krusei NOT DETECTED NOT DETECTED Final   Candida parapsilosis NOT DETECTED NOT DETECTED Final   Candida tropicalis NOT  DETECTED NOT DETECTED Final   Cryptococcus neoformans/gattii NOT DETECTED NOT DETECTED Final   CTX-M ESBL NOT DETECTED NOT DETECTED Final   Carbapenem resistance IMP NOT DETECTED NOT DETECTED Final   Carbapenem resistance KPC NOT DETECTED NOT DETECTED Final   Carbapenem resistance NDM NOT DETECTED NOT DETECTED Final   Carbapenem resist OXA 48 LIKE NOT DETECTED NOT DETECTED Final   Carbapenem resistance VIM NOT DETECTED NOT DETECTED Final    Comment: Performed at [facility], [address], [place], [place] [address]  MRSA Next Gen by PCR, Nasal     Status: None   Collection Time: 04/08/22  4:35 PM   Specimen: Nasal Mucosa; Nasal Swab  Result Value Ref Range Status   MRSA by PCR Next Gen NOT DETECTED NOT DETECTED Final    Comment: (NOTE) The GeneXpert MRSA Assay (FDA approved for NASAL specimens only), is one component of a comprehensive MRSA colonization surveillance program. It is not intended to diagnose MRSA infection nor to guide or monitor treatment for MRSA infections. Test performance is not FDA approved in patients less than 52 years old. Performed at [facility], [place] [address] [place], [place] [address]   Blood culture (routine x 2)     Status: None   Collection Time: 04/08/22  5:07 PM   Specimen: BLOOD LEFT HAND  Result Value Ref Range Status   Specimen Description BLOOD LEFT HAND  Final   Special Requests   Final    BOTTLES DRAWN AEROBIC AND ANAEROBIC Blood Culture results may not be optimal due to an inadequate volume of blood received in culture bottles   Culture   Final    NO GROWTH 5 DAYS Performed at [facility], [place] [address], [place], [place] [address]    Report Status 04/13/2022 FINAL  Final    Radiology Studies: No results found.  Scheduled Meds:  aspirin EC  81 mg Oral Daily   atorvastatin  20 mg Oral Daily   enoxaparin (LOVENOX) injection  40 mg Subcutaneous Q24H   gabapentin  100 mg Oral TID   levothyroxine  50 mcg Oral Q0600    metoprolol tartrate  25 mg Oral BID   oxyCODONE  5 mg Oral Q6H   pantoprazole  40 mg Oral Daily   polyethylene glycol  17 g Oral BID   senna-docusate  1 tablet Oral BID   sodium chloride flush  3 mL Intravenous Q12H   sodium chloride flush  3 mL Intravenous Q12H   sodium chloride flush  3 mL Intravenous Q12H   Continuous Infusions:  sodium chloride 20 mL/hr at 04/08/22 2000   sodium chloride      ceFAZolin (ANCEF) IV 2 g (04/13/22 0702)     LOS: 5 days   Raiford Noble, DO [facility] Available via Epic secure chat 7am-7pm After these hours, please refer to coverage provider listed on [email or web address] 04/13/2022, 3:12 PM

## 2022-04-14 ENCOUNTER — Inpatient Hospital Stay (HOSPITAL_COMMUNITY): Payer: Medicare Other

## 2022-04-14 DIAGNOSIS — I959 Hypotension, unspecified: Secondary | ICD-10-CM | POA: Diagnosis not present

## 2022-04-14 DIAGNOSIS — R7989 Other specified abnormal findings of blood chemistry: Secondary | ICD-10-CM | POA: Diagnosis not present

## 2022-04-14 DIAGNOSIS — N390 Urinary tract infection, site not specified: Secondary | ICD-10-CM | POA: Diagnosis not present

## 2022-04-14 DIAGNOSIS — M25552 Pain in left hip: Secondary | ICD-10-CM

## 2022-04-14 DIAGNOSIS — A419 Sepsis, unspecified organism: Secondary | ICD-10-CM | POA: Diagnosis not present

## 2022-04-14 DIAGNOSIS — D72829 Elevated white blood cell count, unspecified: Secondary | ICD-10-CM

## 2022-04-14 LAB — CBC WITH DIFFERENTIAL/PLATELET
Abs Immature Granulocytes: 0 10*3/uL (ref 0.00–0.07)
Basophils Absolute: 0 10*3/uL (ref 0.0–0.1)
Basophils Relative: 0 %
Eosinophils Absolute: 0 10*3/uL (ref 0.0–0.5)
Eosinophils Relative: 0 %
HCT: 39 % (ref 36.0–46.0)
Hemoglobin: 13.9 g/dL (ref 12.0–15.0)
Lymphocytes Relative: 9 %
Lymphs Abs: 2.3 10*3/uL (ref 0.7–4.0)
MCH: 31.8 pg (ref 26.0–34.0)
MCHC: 35.6 g/dL (ref 30.0–36.0)
MCV: 89.2 fL (ref 80.0–100.0)
Monocytes Absolute: 1.5 10*3/uL — ABNORMAL HIGH (ref 0.1–1.0)
Monocytes Relative: 6 %
Neutro Abs: 21.8 10*3/uL — ABNORMAL HIGH (ref 1.7–7.7)
Neutrophils Relative %: 85 %
Platelets: 193 10*3/uL (ref 150–400)
RBC: 4.37 MIL/uL (ref 3.87–5.11)
RDW: 14.5 % (ref 11.5–15.5)
WBC: 25.7 10*3/uL — ABNORMAL HIGH (ref 4.0–10.5)
nRBC: 0 /100 WBC
nRBC: 0.1 % (ref 0.0–0.2)

## 2022-04-14 LAB — COMPREHENSIVE METABOLIC PANEL
ALT: 39 U/L (ref 0–44)
AST: 50 U/L — ABNORMAL HIGH (ref 15–41)
Albumin: 2.9 g/dL — ABNORMAL LOW (ref 3.5–5.0)
Alkaline Phosphatase: 70 U/L (ref 38–126)
Anion gap: 9 (ref 5–15)
BUN: 26 mg/dL — ABNORMAL HIGH (ref 8–23)
CO2: 24 mmol/L (ref 22–32)
Calcium: 8.2 mg/dL — ABNORMAL LOW (ref 8.9–10.3)
Chloride: 101 mmol/L (ref 98–111)
Creatinine, Ser: 0.74 mg/dL (ref 0.44–1.00)
GFR, Estimated: >60 mL/min (ref >60)
Glucose, Bld: 109 mg/dL — ABNORMAL HIGH (ref 70–99)
Potassium: 4.2 mmol/L (ref 3.5–5.1)
Sodium: 134 mmol/L — ABNORMAL LOW (ref 135–145)
Total Bilirubin: 1 mg/dL (ref 0.3–1.2)
Total Protein: 5.8 g/dL — ABNORMAL LOW (ref 6.5–8.1)

## 2022-04-14 LAB — MAGNESIUM: Magnesium: 2.1 mg/dL (ref 1.7–2.4)

## 2022-04-14 LAB — PHOSPHORUS: Phosphorus: 3.4 mg/dL (ref 2.5–4.6)

## 2022-04-14 MED ORDER — GADOBUTROL 1 MMOL/ML IV SOLN
7.5000 mL | Freq: Once | INTRAVENOUS | Status: AC | PRN
  Administered 2022-04-14: 7.5 mL via INTRAVENOUS

## 2022-04-14 NOTE — TOC Progression Note (Signed)
Transition of Care [facility]) - Progression Note    Patient Details  Name: Amber [name] MRN: [id number] Date of Birth: Nov 29, 1940  Transition [facility]) CM/SW Contact  Eduard Roux, [place] Phone Number: 04/14/2022, 1:39 PM  Clinical Narrative:     Informed [facility]- per MD patient will not discharge today.  TOC will continue to follow and assist with discharge planning.  Antony Blackbird, MSW, LCSW Clinical Social Worker    Expected Discharge Plan: Skilled Nursing Facility Barriers to Discharge: Continued Medical Work up, English as a second language teacher  Expected Discharge Plan and Services Expected Discharge Plan: Skilled Nursing Facility In-house Referral: Clinical Social Work     Living arrangements for the past 2 months: Single Family Home                                       Social Determinants of Health (SDOH) Interventions    Readmission Risk Interventions     No data to display

## 2022-04-14 NOTE — Consult Note (Signed)
[facility]    Date of Admission:  04/08/2022   Total days of inpatient antibiotics 7        Reason for Consult: Leukocytosis    Principal Problem:   Urinary tract infection with hematuria Active Problems:   Complete left bundle branch block   Severe sepsis (HCC)   Transient hypotension   Acute metabolic encephalopathy   Cellulitis, face   Elevated troponin   AKI (acute kidney injury) (HCC)   Normocytic anemia   Thrombocytopenia (HCC)   Sepsis (HCC)   NSTEMI (non-ST elevated myocardial infarction) (HCC)   Chest pain of uncertain etiology   Assessment: 81 year old female admitted with E. coli bacteremia secondary to UTI.   #Ecoli bacteremia 2/2 UTI #Persistent Leukocytosis #Left hip pain-chronic #B/L trochanteric bursitis with hxof nerve root block and more recently left bursa injection #Hx of DVT -Patient had WBC 23 K, 1000.4 on arrival.  CT abdomen pelvis without contrast showed moderate interstitial changes and fluid around left kidney due to pyelonephritis, not obstructive ureteral calculi/bladder calculi.  Blood and urine cultures grew E. coli, patient on cefazolin.  She continues to have leukocytosis WBC 25 K.  ID engaged per leukocytosis.  Bilateral lower extremity Dopplers on 1/11 did not show DVT. -She has received bursa injection for bursitis followed by [facility](Kimberly Meyran NP). Last time she was injected was on 11/9 in left hip. Over the next couple day she developed incontinence. Then fever and chills leading to admission. She repots she has had difficulty bearing weight on the left leg for 18 months.  Recommendations: - Continue cefazolin   -MRI left hip -I think if that is negative then plan on CT AP w/con to look for foci of infection -Follow wbc -Monitor clinically Microbiology:   Antibiotics: Augmentin 11/10 Ceftriaxone 11/02-1111 Cefazolin 11/14- Cultures: Blood 11/10 ecoli 11/11  NG Urine 11/10 Ecoli   HPI: Amber [name] is a 81 y.o. female past medical history of hypertension, peripheral vascular disease, DVT not on anticoagulation, family history of blood clots, hypothyroidism presenting with complaint of weakness.  Patient daughter reported patient she has been lethargic.  On arrival to the ED she was hypotensive, WBC BC 33 K, 100.3.  CT abdomen pelvis showed moderate interstitial changes and fluid around left kidney due to pyelonephritis, not obstructive ureteral calculi/bladder calculi.  Blood and urine cultures grew E. coli patient transition ceftriaxone then cefazolin.  ID engaged as she continues to have leukocytosis without fever WBC 26 K.  Reports left hip pain.    Review of Systems: Review of Systems  All other systems reviewed and are negative.   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   Rotator cuff rupture, complete 07/05/2011   Varicose veins     Social History   Tobacco Use   Smoking status: Former    Packs/day: 0.25    Years: 13.00    Total pack years: 3.25    Types: Cigarettes    Quit date: 06/22/1964    Years since quitting: 57.8   Smokeless tobacco: Never  Substance Use Topics   Alcohol use: No  Drug use: No    Family History  Problem Relation Age of Onset   Heart disease Mother    Cancer Mother    Varicose Veins Mother    Deep vein thrombosis Mother    Diabetes Mother    Hypertension Mother    Depression Mother    Obesity Mother    Heart disease Father    AAA (abdominal aortic aneurysm) Father    Varicose Veins Sister    Cancer Sister    Diabetes Sister    Heart disease Sister 46   Hypertension Sister    Vision loss Sister    Diabetes Brother    Vision  loss Brother    Peripheral vascular disease Brother    AAA (abdominal aortic aneurysm) Brother    Diabetes Sister    Heart disease Sister    Hypertension Sister    Vision loss Sister    Diabetes Sister    Vision loss Sister    Scheduled Meds:  aspirin EC  81 mg Oral Daily   atorvastatin  20 mg Oral Daily   enoxaparin (LOVENOX) injection  40 mg Subcutaneous Q24H   gabapentin  100 mg Oral TID   levothyroxine  50 mcg Oral Q0600   metoprolol tartrate  25 mg Oral BID   pantoprazole  40 mg Oral Daily   polyethylene glycol  17 g Oral BID   senna-docusate  1 tablet Oral BID   sodium chloride flush  3 mL Intravenous Q12H   sodium chloride flush  3 mL Intravenous Q12H   sodium chloride flush  3 mL Intravenous Q12H   Continuous Infusions:  sodium chloride 20 mL/hr at 04/08/22 2000   sodium chloride      ceFAZolin (ANCEF) IV 2 g (04/14/22 0515)   PRN Meds:.sodium chloride, acetaminophen, albuterol, bisacodyl, clobetasol ointment, nitroGLYCERIN, nystatin, ondansetron (ZOFRAN) IV, ondansetron **OR** [DISCONTINUED] ondansetron (ZOFRAN) IV, mouth rinse, oxyCODONE, polyvinyl alcohol, sodium chloride flush Allergies  Allergen Reactions   Codeine Nausea And Vomiting   Darvocet [Propoxyphene N-Acetaminophen] Nausea And Vomiting   Vicodin [Hydrocodone-Acetaminophen] Other (See Comments)    Unknown reactions   Nickel Rash   Sulfa Antibiotics Rash    Like a sunburn    OBJECTIVE: Blood pressure (!) 151/68, pulse 81, temperature 98.2 F (36.8 C), temperature source Oral, resp. rate 15, height 5\' 2"  (1.575 m), weight 81.7 kg, SpO2 97 %.  Physical Exam Constitutional:      Appearance: Normal appearance.  HENT:     Head: Normocephalic and atraumatic.     Right Ear: Tympanic membrane normal.     Left Ear: Tympanic membrane normal.     Nose: Nose normal.     Mouth/Throat:     Mouth: Mucous membranes are moist.  Eyes:     Extraocular Movements: Extraocular movements intact.      Conjunctiva/sclera: Conjunctivae normal.     Pupils: Pupils are equal, round, and reactive to light.  Cardiovascular:     Rate and Rhythm: Normal rate and regular rhythm.     Heart sounds: No murmur heard.    No friction rub. No gallop.  Pulmonary:     Effort: Pulmonary effort is normal.     Breath sounds: Normal breath sounds.  Abdominal:     General: Abdomen is flat.     Palpations: Abdomen is soft.  Musculoskeletal:        General: Normal range of motion.  Skin:    General: Skin is warm and dry.  Neurological:  General: No focal deficit present.     Mental Status: She is alert and oriented to person, place, and time.  Psychiatric:        Mood and Affect: Mood normal.     Lab Results Lab Results  Component Value Date   WBC 25.7 (H) 04/14/2022   HGB 13.9 04/14/2022   HCT 39.0 04/14/2022   MCV 89.2 04/14/2022   PLT 193 04/14/2022    Lab Results  Component Value Date   CREATININE 0.74 04/14/2022   BUN 26 (H) 04/14/2022   NA 134 (L) 04/14/2022   K 4.2 04/14/2022   CL 101 04/14/2022   CO2 24 04/14/2022    Lab Results  Component Value Date   ALT 39 04/14/2022   AST 50 (H) 04/14/2022   ALKPHOS 70 04/14/2022   BILITOT 1.0 04/14/2022       Danelle Earthly, MD [facility] 04/14/2022, 3:05 PM

## 2022-04-14 NOTE — Progress Notes (Signed)
Plan of care is reviewed. Pt has been progressing. Pt is hemodynamically stable, afebrile. No acute distress noted. She is able to sleep very well tonight. We will monitor.   Filiberto Pinks, RN

## 2022-04-14 NOTE — Progress Notes (Signed)
PROGRESS NOTE    Amber [name]  WGY:[id number] DOB: May 21, 1941 DOA: 04/08/2022 PCP: Leamon Arnt, MD   Brief Narrative:  Amber [name] is a 81 y.o. female with PMH significant for HTN, HLD, PAD, DVT, arthritis, hypothyroidism, varicose vein, rotator cuff rupture who lives alone at home, recently treated for UTI. 11/11, patient was brought to the ED from home with urinary incontinence, frequency, altered mental status. EMS noted a low blood pressure at 70/50 which improved with normal saline hydration.   In the ED, patient had a temperature of 100.3, tachycardia, tachypnea. Labs with WC count elevated to 33.7, creatinine 1.63, lactic acid elevated to 3.3 CT of the head and x-ray chest were unremarkable. Urinalysis showed large leukocytes, positive nitrites, many bacteria CT abdomen pelvis showed left pyelonephritis, no obstructing calculi. Patient was started on IV antibiotics, IV fluids Admitted to [facility] service for sepsis due to acute left pyelonephritis Within few hours of admission, patient's blood pressure started to drop again and hence she was transferred to ICU for septic shock. Urine culture and blood culture sent on admission grew E. coli. 11/13, transferred out to [facility].  She is slowly improving but continues to complain of significant left hip pain and discomfort.  Her WBC had improved however slowly started trending back upward again.  Given that her hip continues to hurt we will blurt ADG pelvis and x-ray and notified the neurosurgery team who injected her into the bursa last week.  Because of her persistent leukocytosis without improvement ID has been consulted and they are recommending an MRI of the hip and if the MRI is negative I will obtain a CT of the abdomen pelvis to evaluate and look for any foci of infection.  Assessment and Plan:  Septic Shock - POA Acute left Pyelonephritis E. coli UTI and Bacteremia -Patient met sepsis criteria on admission.  Within few hours,  blood pressure dropped significantly and hence transferred to ICU for septic shock and started on pressors.   -Eventually pressors weaned down.  Currently blood pressure is elevated. -Urine culture and blood culture sent on admission grew E. coli. -Initially started on IV Rocephin.  Switched to IV Ancef 04/11/22.   -No fever.   -Lactic acid level normalized and is now 1.9 -Continue to monitor labs. -Continue to Monitor and Trend WBC and went from 19.6 -> 21.5 -> 25.7 -Given her persistent leukocytosis infectious diseases has been consulted recommending an MRI of the hip and if this is negative will obtain a CT of the abdomen pelvis to look for any foci of infection -DG Left Hip showed "Mild-to-moderate bilateral femoroacetabular osteoarthritis." -PT/OT Recommending SNF and has a bed available for tomorrow we will need to figure out the etiology of her leukocytosis   NSTEMI Nonobstructive CAD -Patient reported several weeks of exertional angina and dyspnea -10/12, during ambulation, she had recurrence of symptoms.  Symptoms improved with morphine IV.   -Troponin was elevated, peaked at 308.  Trend as below. -Patient was started on IV heparin drip and now stopped  -11/13, underwent cardiac cath.  Found to have mild nonobstructive CAD. -Continue Aspirin 81 mg po Daily, Atorvastatin 20 mg po Daily  and Metoprolol Tartrate 25 mg po BID and NTG 0.4 mg SL q66mnprn. Recent Labs (last 2 labs)           Recent Labs    04/09/22 1614 04/09/22 1922 04/10/22 0052  TROPONINIHS 243* 308* 192*    -She will need cardiology follow-up in outpatient setting  within 1 to 2 weeks   Abnormal LFTs, improving -In the setting of above -Likely had some hypoperfusion to the liver -May need to hold her statin but will need to continue monitoring AST is now 95 -> 50 and ALT is now 80 -> 39 -Okay to monitor and trend and repeat CMP in a.m. and if necessary will obtain a right upper quadrant ultrasound and acute  hepatitis panel -Repeat CMP in the a.m.   Hypertension -PTA on losartan 50 mg daily, HCTZ 12.5 mg daily -BP meds were held because of septic shock.  Blood pressure improving, in 140s this morning. -Currently on Metoprolol 25 mg twice daily.   -Continue to monitor BP per Protocol and last BP was slightly elevated at 172/85 yesterday and today is now 158/73   Hypoalbuminemia  -Patient's Albumin Level is now 2.5 -Continue to Monitor and Trend and repeat CMP in the AM    History of PAD -As above C/w Aspirin, statin and metoprolol.   Hypothyroidism -C/w Levothyroxine 50 mcg po Daily   Chronic Back Pain Lumbar radiculopathy Chronic Hip Pain  -PTA on Neurontin 100 mg 3 times daily, oxycodone 5 mg q6h as needed, Flexeril 10 mg 3 times daily as needed -Resumed all home meds but will need to make adjustments given Pain in bursa limiting Movement will increase oxycodone from 5 mg every 6hprn to 5 to 10 mg every 4 as needed; given that this did not help her improve her symptoms we will order aqua thermia and ordered DG hip and pelvis x-ray which showed "Mild-to-moderate bilateral femoroacetabular osteoarthritis." -Recently got an injection in her Left and Right Hip bursa  -Neurosurgery has been notified and Dr. Ronnald Ramp has been added to the treatment team -ID recommending obtaining an MRI MRI of the hip  Mild chronic Anemia -Patient's Hgb/Hct went from 10.6/30.1 -> 11.3/32.0 -> 11.4/32.4 -> 12.5/36.0 -Check Anemia Panel in the AM -Continue to Monitor for S/Sx of Bleeding; No overt bleeding noted -Repeat CBC in the AM    Constipation -Start bowel regimen with MiraLAX 17 g p.o. twice daily, senna docusate 1 tab p.o. twice daily and bisacodyl 10 mg as needed daily   Obesity -Complicates overall prognosis and care -Estimated body mass index is 32.94 kg/m as calculated from the following:   Height as of this encounter: _0  (1.575 m).   Weight as of this encounter: 81.7 kg.  -Weight Loss and  Dietary Counseling given   DVT prophylaxis: enoxaparin (LOVENOX) injection 40 mg Start: 04/11/22 0800 SCD's Start: 04/10/22 1717 SCDs Start: 04/08/22 1447    Code Status: Full Code Family Communication: Discussed with sister at bedside   Disposition Plan:  Level of care: Progressive Status is: Inpatient Remains inpatient appropriate because: Has a persistent leukocytosis and needs further evaluation and continues to have significant left hip pain   Consultants:  PCCM transfer Infectious diseases Neurosurgery  Procedures:  As delineated as above  Antimicrobials:  Anti-infectives (From admission, onward)    Start     Dose/Rate Route Frequency Ordered Stop   04/11/22 0600  ceFAZolin (ANCEF) IVPB 2g/100 mL premix        2 g 200 mL/hr over 30 Minutes Intravenous Every 8 hours 04/10/22 1136     04/09/22 0600  cefTRIAXone (ROCEPHIN) 2 g in sodium chloride 0.9 % 100 mL IVPB  Status:  Discontinued        2 g 200 mL/hr over 30 Minutes Intravenous Every 24 hours 04/08/22 1204 04/10/22 1136  04/08/22 0630  amoxicillin-clavulanate (AUGMENTIN) 875-125 MG per tablet 1 tablet        1 tablet Oral  Once 04/08/22 0618 04/08/22 0636   04/08/22 0600  cefTRIAXone (ROCEPHIN) 2 g in sodium chloride 0.9 % 100 mL IVPB        2 g 200 mL/hr over 30 Minutes Intravenous  Once 04/08/22 0549 04/08/22 0858       Subjective: Seen and examined at bedside and was still having significant left hip pain limiting her mobility.  She denied any lightheadedness or dizziness.  Denies any chest pain or shortness of breath.  No other concerns or complaints at this time.  Objective: Vitals:   04/14/22 0500 04/14/22 0815 04/14/22 1150 04/14/22 1616  BP: (!) 162/81 134/68 (!) 151/68 (!) 158/73  Pulse: 81 81 81 88  Resp: _0 Temp: 98.2 F (36.8 C) 97.9 F (36.6 C) 98.2 F (36.8 C) 98 F (36.7 C)  TempSrc: Oral Oral Oral Oral  SpO2: 97% 96% 97% 100%  Weight: 81.7 kg     Height:         Intake/Output Summary (Last 24 hours) at 04/14/2022 1625 Last data filed at 04/14/2022 0515 Gross per 24 hour  Intake 600 ml  Output 1600 ml  Net -1000 ml   Filed Weights   04/10/22 0500 04/13/22 0640 04/14/22 0500  Weight: 89.7 kg 82.6 kg 81.7 kg   Examination: Physical Exam:  Constitutional: WN/WD obese Caucasian female currently no acute distress appears calm but sitting in a chair complaining of significant left hip pain Respiratory: Diminished to auscultation bilaterally, no wheezing, rales, rhonchi or crackles. Normal respiratory effort and patient is not tachypenic. No accessory muscle use.  Unlabored breathing Cardiovascular: RRR, no murmurs / rubs / gallops. S1 and S2 auscultated.  Abdomen: Soft, non-tender, distended secondary to body habitus bowel sounds positive.  GU: Deferred. Musculoskeletal: No clubbing / cyanosis of digits/nails. No joint deformity upper and lower extremities.  Skin: No rashes, lesions, ulcers on limited skin evaluation. No induration; Warm and dry.  Neurologic: CN 2-12 grossly intact with no focal deficits. Romberg sign and cerebellar reflexes not assessed.  Psychiatric: Normal judgment and insight. Alert and oriented x 3. Normal mood and appropriate affect.   Data Reviewed: I have personally reviewed following labs and imaging studies  CBC: Recent Labs  Lab 04/08/22 0224 04/08/22 0859 04/10/22 0052 04/11/22 0123 04/12/22 0138 04/13/22 0112 04/14/22 1026  WBC 33.7*   < > 35.2* 25.3* 19.6* 21.5* 25.7*  NEUTROABS 28.4*  --   --   --  15.1* 17.6* 21.8*  HGB 11.4*   < > 10.6* 11.3* 11.4* 12.5 13.9  HCT 33.2*   < > 30.1* 32.0* 32.4* 36.0 39.0  MCV 93.5   < > 92.0 90.9 89.8 90.2 89.2  PLT 146*   < > 106* 110* 114* 142* 193   < > = values in this interval not displayed.   Basic Metabolic Panel: Recent Labs  Lab 04/08/22 0224 04/08/22 1606 04/09/22 0413 04/11/22 0123 04/12/22 0138 04/13/22 0112 04/14/22 1026  NA 136  --  136 138  137 137 134*  K 3.3*  --  4.2 3.8 3.9 4.1 4.2  CL 102  --  103 107 107 103 101  CO2 24  --  18* 20* _1 GLUCOSE 165*  --  122* 167* 126* 128* 109*  BUN 28*  --  31* 29* 34* 30* 26*  CREATININE 1.63*   < >  1.09* 0.81 0.80 0.78 0.74  CALCIUM 9.0  --  8.2* 8.4* 8.3* 8.6* 8.2*  MG 1.9  --   --   --   --  1.8 2.1  PHOS  --   --   --   --   --  3.2 3.4   < > = values in this interval not displayed.   GFR: Estimated Creatinine Clearance: 52.7 mL/min (by C-G formula based on SCr of 0.74 mg/dL). Liver Function Tests: Recent Labs  Lab 04/08/22 0224 04/13/22 0112 04/14/22 1026  AST 36 95* 50*  ALT 28 80* 39  ALKPHOS 58 80 70  BILITOT 0.9 0.8 1.0  PROT 5.6* 5.2* 5.8*  ALBUMIN 3.3* 2.5* 2.9*   No results for input(s): "LIPASE", "AMYLASE" in the last 168 hours. No results for input(s): "AMMONIA" in the last 168 hours. Coagulation Profile: No results for input(s): "INR", "PROTIME" in the last 168 hours. Cardiac Enzymes: No results for input(s): "CKTOTAL", "CKMB", "CKMBINDEX", "TROPONINI" in the last 168 hours. BNP (last 3 results) No results for input(s): "PROBNP" in the last 8760 hours. HbA1C: No results for input(s): "HGBA1C" in the last 72 hours. CBG: No results for input(s): "GLUCAP" in the last 168 hours. Lipid Profile: No results for input(s): "CHOL", "HDL", "LDLCALC", "TRIG", "CHOLHDL", "LDLDIRECT" in the last 72 hours. Thyroid Function Tests: No results for input(s): "TSH", "T4TOTAL", "FREET4", "T3FREE", "THYROIDAB" in the last 72 hours. Anemia Panel: No results for input(s): "VITAMINB12", "FOLATE", "FERRITIN", "TIBC", "IRON", "RETICCTPCT" in the last 72 hours. Sepsis Labs: Recent Labs  Lab 04/08/22 0859 04/08/22 1308 04/09/22 1321 04/11/22 0123  LATICACIDVEN 2.6* 2.8* 2.8* 1.9    Recent Results (from the past 240 hour(s))  Urine Culture     Status: Abnormal   Collection Time: 04/08/22  5:28 AM   Specimen: Urine, Catheterized  Result Value Ref Range Status    Specimen Description URINE, CATHETERIZED  Final   Special Requests   Final    NONE Performed at [facility], [place] [address] [place], [place] [address]    Culture >=100,000 COLONIES/mL ESCHERICHIA COLI (A)  Final   Report Status 04/10/2022 FINAL  Final   Organism ID, Bacteria ESCHERICHIA COLI (A)  Final      Susceptibility   Escherichia coli - MIC*    AMPICILLIN <=2 SENSITIVE Sensitive     CEFAZOLIN <=4 SENSITIVE Sensitive     CEFEPIME <=0.12 SENSITIVE Sensitive     CEFTRIAXONE <=0.25 SENSITIVE Sensitive     CIPROFLOXACIN <=0.25 SENSITIVE Sensitive     GENTAMICIN <=1 SENSITIVE Sensitive     IMIPENEM <=0.25 SENSITIVE Sensitive     NITROFURANTOIN <=16 SENSITIVE Sensitive     TRIMETH/SULFA <=20 SENSITIVE Sensitive     AMPICILLIN/SULBACTAM <=2 SENSITIVE Sensitive     PIP/TAZO <=4 SENSITIVE Sensitive     * >=100,000 COLONIES/mL ESCHERICHIA COLI  Blood culture (routine x 2)     Status: Abnormal   Collection Time: 04/08/22  6:40 AM   Specimen: BLOOD  Result Value Ref Range Status   Specimen Description BLOOD RIGHT ANTECUBITAL  Final   Special Requests   Final    BOTTLES DRAWN AEROBIC AND ANAEROBIC Blood Culture adequate volume   Culture  Setup Time   Final    IN BOTH AEROBIC AND ANAEROBIC BOTTLES GRAM NEGATIVE RODS Organism ID to follow CRITICAL RESULT CALLED TO, READ BACK BY AND VERIFIED WITH:  C/ PHARMD L. CHEN 04/08/22 1920 A. LAFRANCE  Performed at [facility], [place]  [address], [place], [place] [address]    Culture ESCHERICHIA COLI (A)  Final   Report Status 04/10/2022 FINAL  Final   Organism ID, Bacteria ESCHERICHIA COLI  Final      Susceptibility   Escherichia coli - MIC*    AMPICILLIN <=2 SENSITIVE Sensitive     CEFAZOLIN <=4 SENSITIVE Sensitive     CEFEPIME <=0.12 SENSITIVE Sensitive     CEFTAZIDIME <=1 SENSITIVE Sensitive     CEFTRIAXONE <=0.25 SENSITIVE Sensitive     CIPROFLOXACIN <=0.25 SENSITIVE Sensitive     GENTAMICIN <=1 SENSITIVE Sensitive      IMIPENEM <=0.25 SENSITIVE Sensitive     TRIMETH/SULFA <=20 SENSITIVE Sensitive     AMPICILLIN/SULBACTAM <=2 SENSITIVE Sensitive     PIP/TAZO <=4 SENSITIVE Sensitive     * ESCHERICHIA COLI  Blood Culture ID Panel (Reflexed)     Status: Abnormal   Collection Time: 04/08/22  6:40 AM  Result Value Ref Range Status   Enterococcus faecalis NOT DETECTED NOT DETECTED Final   Enterococcus Faecium NOT DETECTED NOT DETECTED Final   Listeria monocytogenes NOT DETECTED NOT DETECTED Final   Staphylococcus species NOT DETECTED NOT DETECTED Final   Staphylococcus aureus (BCID) NOT DETECTED NOT DETECTED Final   Staphylococcus epidermidis NOT DETECTED NOT DETECTED Final   Staphylococcus lugdunensis NOT DETECTED NOT DETECTED Final   Streptococcus species NOT DETECTED NOT DETECTED Final   Streptococcus agalactiae NOT DETECTED NOT DETECTED Final   Streptococcus pneumoniae NOT DETECTED NOT DETECTED Final   Streptococcus pyogenes NOT DETECTED NOT DETECTED Final   A.calcoaceticus-baumannii NOT DETECTED NOT DETECTED Final   Bacteroides fragilis NOT DETECTED NOT DETECTED Final   Enterobacterales DETECTED (A) NOT DETECTED Final    Comment: Enterobacterales represent a large order of gram negative bacteria, not a single organism. CRITICAL RESULT CALLED TO, READ BACK BY AND VERIFIED WITH:  C/ PHARMD L. CHEN 04/08/22 1920 A. LAFRANCE     Enterobacter cloacae complex NOT DETECTED NOT DETECTED Final   Escherichia coli DETECTED (A) NOT DETECTED Final    Comment: CRITICAL RESULT CALLED TO, READ BACK BY AND VERIFIED WITH:  C/ PHARMD L. CHEN 04/08/22 1920 A. LAFRANCE     Klebsiella aerogenes NOT DETECTED NOT DETECTED Final   Klebsiella oxytoca NOT DETECTED NOT DETECTED Final   Klebsiella pneumoniae NOT DETECTED NOT DETECTED Final   Proteus species NOT DETECTED NOT DETECTED Final   Salmonella species NOT DETECTED NOT DETECTED Final   Serratia marcescens NOT DETECTED NOT DETECTED Final   Haemophilus influenzae NOT  DETECTED NOT DETECTED Final   Neisseria meningitidis NOT DETECTED NOT DETECTED Final   Pseudomonas aeruginosa NOT DETECTED NOT DETECTED Final   Stenotrophomonas maltophilia NOT DETECTED NOT DETECTED Final   Candida albicans NOT DETECTED NOT DETECTED Final   Candida auris NOT DETECTED NOT DETECTED Final   Candida glabrata NOT DETECTED NOT DETECTED Final   Candida krusei NOT DETECTED NOT DETECTED Final   Candida parapsilosis NOT DETECTED NOT DETECTED Final   Candida tropicalis NOT DETECTED NOT DETECTED Final   Cryptococcus neoformans/gattii NOT DETECTED NOT DETECTED Final   CTX-M ESBL NOT DETECTED NOT DETECTED Final   Carbapenem resistance IMP NOT DETECTED NOT DETECTED Final   Carbapenem resistance KPC NOT DETECTED NOT DETECTED Final   Carbapenem resistance NDM NOT DETECTED NOT DETECTED Final   Carbapenem resist OXA 48 LIKE NOT DETECTED NOT DETECTED Final   Carbapenem resistance VIM NOT DETECTED NOT DETECTED Final    Comment: Performed at [facility], [address],  [place], [place] [address]  MRSA Next Gen by PCR, Nasal     Status: None   Collection Time: 04/08/22  4:35 PM   Specimen: Nasal Mucosa; Nasal Swab  Result Value Ref Range Status   MRSA by PCR Next Gen NOT DETECTED NOT DETECTED Final    Comment: (NOTE) The GeneXpert MRSA Assay (FDA approved for NASAL specimens only), is one component of a comprehensive MRSA colonization surveillance program. It is not intended to diagnose MRSA infection nor to guide or monitor treatment for MRSA infections. Test performance is not FDA approved in patients less than 48 years old. Performed at [facility], [place] [address], [place], [place] [address]   Blood culture (routine x 2)     Status: None   Collection Time: 04/08/22  5:07 PM   Specimen: BLOOD LEFT HAND  Result Value Ref Range Status   Specimen Description BLOOD LEFT HAND  Final   Special Requests   Final    BOTTLES DRAWN AEROBIC AND ANAEROBIC Blood Culture results may  not be optimal due to an inadequate volume of blood received in culture bottles   Culture   Final    NO GROWTH 5 DAYS Performed at [facility], [place] [address] [place], [place] [address]    Report Status 04/13/2022 FINAL  Final     Radiology Studies: DG HIP UNILAT WITH PELVIS 2-3 VIEWS LEFT  Result Date: 04/14/2022 CLINICAL DATA:  Left hip pain. EXAM: DG HIP (WITH OR WITHOUT PELVIS) 2-3V LEFT COMPARISON:  CT abdomen and pelvis 04/08/2022 FINDINGS: There is diffuse decreased bone mineralization. Mild-to-moderate bilateral femoroacetabular joint space narrowing and superolateral acetabular degenerative osteophytes. The bilateral sacroiliac and pubic symphysis joint spaces are maintained. No acute fracture is seen. No dislocation. Partial visualization of lumbar spine transpedicular rod and screw fusion hardware. Mild vascular calcifications. IMPRESSION: Mild-to-moderate bilateral femoroacetabular osteoarthritis. Electronically Signed   By: Yvonne Kendall M.D.   On: 04/14/2022 13:03    Scheduled Meds:  aspirin EC  81 mg Oral Daily   atorvastatin  20 mg Oral Daily   enoxaparin (LOVENOX) injection  40 mg Subcutaneous Q24H   gabapentin  100 mg Oral TID   levothyroxine  50 mcg Oral Q0600   metoprolol tartrate  25 mg Oral BID   pantoprazole  40 mg Oral Daily   polyethylene glycol  17 g Oral BID   senna-docusate  1 tablet Oral BID   sodium chloride flush  3 mL Intravenous Q12H   sodium chloride flush  3 mL Intravenous Q12H   sodium chloride flush  3 mL Intravenous Q12H   Continuous Infusions:  sodium chloride 20 mL/hr at 04/08/22 2000   sodium chloride      ceFAZolin (ANCEF) IV 2 g (04/14/22 1506)    LOS: 6 days   Raiford Noble, DO [facility] Available via Epic secure chat 7am-7pm After these hours, please refer to coverage provider listed on [email or web address] 04/14/2022, 4:25 PM

## 2022-04-14 NOTE — Progress Notes (Signed)
Physical Therapy Treatment Patient Details Name: Amber [name] MRN: [id number] DOB: Apr 23, 1941 Today's Date: 04/14/2022   History of Present Illness Pt is a 81 y/o female presenting on 04/08/22 with AMS. Recently treated for UTI. Admitted with sepsis. CT abdomen with concern for pyleonephritis around L kidney. Cardiac cath 11/13. PMH includes: arthritis, DVT, HTN, macular degeneration, PVD, bil shoulder surgery, TKR, chronic back pain    PT Comments    Pt was limited in gait distance today due to her L hip pain, only ambulating ~90 ft with up to minA to maintain balance today. Imaging today revealed "mild-to-moderate bilateral femoroacetabular osteoarthritis" and pt reports a hx of bursitis, which she reports this feels similar. Educated and assisted pt in figure 4 and IT band supine stretches and on gentle massage to her bursa region to try to reduce inflammation and pain. Will continue to follow acutely. Current recommendations remain appropriate.    Recommendations for follow up therapy are one component of a multi-disciplinary discharge planning process, led by the attending physician.  Recommendations may be updated based on patient status, additional functional criteria and insurance authorization.  Follow Up Recommendations  Skilled nursing-short term rehab (<3 hours/day) Can patient physically be transported by private vehicle: Yes   Assistance Recommended at Discharge Intermittent Supervision/Assistance  Patient can return home with the following A little help with walking and/or transfers;A little help with bathing/dressing/bathroom;Assistance with cooking/housework;Assist for transportation;Help with stairs or ramp for entrance   Equipment Recommendations  None recommended by PT    Recommendations for Other Services       Precautions / Restrictions Precautions Precautions: Fall Precaution Comments: R wrist cath 11/13 Restrictions Weight Bearing Restrictions: No      Mobility  Bed Mobility Overal bed mobility: Needs Assistance Bed Mobility: Sit to Supine       Sit to supine: Min assist, HOB elevated   General bed mobility comments: MinA to lift legs onto bed, HOB elevated. Extra time and cues for pt to bridge in bed to transition superiorly.    Transfers Overall transfer level: Needs assistance Equipment used: Rolling walker (2 wheels) Transfers: Sit to/from Stand Sit to Stand: Min guard, Min assist           General transfer comment: Min guard assist and extra time and effort coming to stand, 1x from recliner and 1x from commode. Pt's visitor did stabilize RW the first rep    Ambulation/Gait Ambulation/Gait assistance: Min guard, Min assist Gait Distance (Feet): 90 Feet (x2 bouts of ~23 ft > ~90 ft) Assistive device: Rolling walker (2 wheels) Gait Pattern/deviations: Step-through pattern, Shuffle, Trunk flexed, Decreased stride length, Narrow base of support, Knee flexed in stance - right, Knee flexed in stance - left, Trendelenburg Gait velocity: decreased Gait velocity interpretation: <1.31 ft/sec, indicative of household ambulator   General Gait Details: Pt with flexed posture and bil knees flexed with shuffling slow steps. Needs cues for upright posture. Noted almost trendelenburg like gait pattern due to L hip pain.  Min guard assist initially, but as distance and pain progressed she required minA for stability   Stairs             Wheelchair Mobility    Modified Rankin (Stroke Patients Only)       Balance Overall balance assessment: Needs assistance Sitting-balance support: No upper extremity supported, Feet supported Sitting balance-Leahy Scale: Fair Sitting balance - Comments: static sitting EOB without LOB, supervision for safety   Standing balance support: No upper extremity supported,  Single extremity supported, Bilateral upper extremity supported Standing balance-Leahy Scale: Fair Standing balance  comment: Able to stand briefly without UE support to try to manage her underwear, but benefits from at least 1 UE support for improved stability.                            Cognition Arousal/Alertness: Awake/alert Behavior During Therapy: WFL for tasks assessed/performed Overall Cognitive Status: Within Functional Limits for tasks assessed                                          Exercises Other Exercises Other Exercises: Gentle L figure 4 and IT band stretch supine in bed, 1x each    General Comments General comments (skin integrity, edema, etc.): VSS on RA; educated pt on ice and gentle massage to L bursa region along with L IT band stretches to try to reduce inflammation and pain      Pertinent Vitals/Pain Pain Assessment Pain Assessment: Faces Faces Pain Scale: Hurts little more Pain Location: L hip Pain Descriptors / Indicators: Discomfort, Grimacing, Guarding Pain Intervention(s): Limited activity within patient's tolerance, Monitored during session, Repositioned, Other (comment) (directed her on IT band stretches)    Home Living                          Prior Function            PT Goals (current goals can now be found in the care plan section) Acute Rehab PT Goals Patient Stated Goal: to reduce her L hip bursitis pain PT Goal Formulation: With patient/family Time For Goal Achievement: 04/23/22 Potential to Achieve Goals: Good Progress towards PT goals: Progressing toward goals    Frequency    Min 2X/week      PT Plan Current plan remains appropriate    Co-evaluation              AM-PAC PT "6 Clicks" Mobility   Outcome Measure  Help needed turning from your back to your side while in a flat bed without using bedrails?: A Little Help needed moving from lying on your back to sitting on the side of a flat bed without using bedrails?: A Little Help needed moving to and from a bed to a chair (including a  wheelchair)?: A Little Help needed standing up from a chair using your arms (e.g., wheelchair or bedside chair)?: A Little Help needed to walk in hospital room?: A Little Help needed climbing 3-5 steps with a railing? : Total 6 Click Score: 16    End of Session Equipment Utilized During Treatment: Gait belt Activity Tolerance: Patient tolerated treatment well Patient left: in bed;with call bell/phone within reach;with bed alarm set;with family/visitor present Nurse Communication: Mobility status PT Visit Diagnosis: Unsteadiness on feet (R26.81);Other abnormalities of gait and mobility (R26.89);Repeated falls (R29.6);Muscle weakness (generalized) (M62.81);Difficulty in walking, not elsewhere classified (R26.2)     Time: 1478-2956 PT Time Calculation (min) (ACUTE ONLY): 28 min  Charges:  $Gait Training: 8-22 mins $Therapeutic Activity: 8-22 mins                     Raymond Gurney, PT, DPT Acute Rehabilitation Services  Office: [phone]    Jewel Baize 04/14/2022, 3:57 PM

## 2022-04-15 ENCOUNTER — Inpatient Hospital Stay (HOSPITAL_COMMUNITY): Payer: Medicare Other

## 2022-04-15 DIAGNOSIS — I959 Hypotension, unspecified: Secondary | ICD-10-CM | POA: Diagnosis not present

## 2022-04-15 DIAGNOSIS — R7989 Other specified abnormal findings of blood chemistry: Secondary | ICD-10-CM | POA: Diagnosis not present

## 2022-04-15 DIAGNOSIS — N390 Urinary tract infection, site not specified: Secondary | ICD-10-CM | POA: Diagnosis not present

## 2022-04-15 DIAGNOSIS — A419 Sepsis, unspecified organism: Secondary | ICD-10-CM | POA: Diagnosis not present

## 2022-04-15 LAB — COMPREHENSIVE METABOLIC PANEL
ALT: 27 U/L (ref 0–44)
AST: 35 U/L (ref 15–41)
Albumin: 2.5 g/dL — ABNORMAL LOW (ref 3.5–5.0)
Alkaline Phosphatase: 58 U/L (ref 38–126)
Anion gap: 8 (ref 5–15)
BUN: 22 mg/dL (ref 8–23)
CO2: 28 mmol/L (ref 22–32)
Calcium: 8.1 mg/dL — ABNORMAL LOW (ref 8.9–10.3)
Chloride: 100 mmol/L (ref 98–111)
Creatinine, Ser: 0.78 mg/dL (ref 0.44–1.00)
GFR, Estimated: >60 mL/min (ref >60)
Glucose, Bld: 123 mg/dL — ABNORMAL HIGH (ref 70–99)
Potassium: 4.5 mmol/L (ref 3.5–5.1)
Sodium: 136 mmol/L (ref 135–145)
Total Bilirubin: 0.8 mg/dL (ref 0.3–1.2)
Total Protein: 5.1 g/dL — ABNORMAL LOW (ref 6.5–8.1)

## 2022-04-15 LAB — CBC WITH DIFFERENTIAL/PLATELET
Abs Immature Granulocytes: 0 10*3/uL (ref 0.00–0.07)
Basophils Absolute: 0 10*3/uL (ref 0.0–0.1)
Basophils Relative: 0 %
Eosinophils Absolute: 0 10*3/uL (ref 0.0–0.5)
Eosinophils Relative: 0 %
HCT: 35.6 % — ABNORMAL LOW (ref 36.0–46.0)
Hemoglobin: 12.3 g/dL (ref 12.0–15.0)
Lymphocytes Relative: 10 %
Lymphs Abs: 2.1 10*3/uL (ref 0.7–4.0)
MCH: 31.7 pg (ref 26.0–34.0)
MCHC: 34.6 g/dL (ref 30.0–36.0)
MCV: 91.8 fL (ref 80.0–100.0)
Monocytes Absolute: 1.3 10*3/uL — ABNORMAL HIGH (ref 0.1–1.0)
Monocytes Relative: 6 %
Neutro Abs: 18 10*3/uL — ABNORMAL HIGH (ref 1.7–7.7)
Neutrophils Relative %: 84 %
Platelets: 180 10*3/uL (ref 150–400)
RBC: 3.88 MIL/uL (ref 3.87–5.11)
RDW: 14.6 % (ref 11.5–15.5)
WBC: 21.4 10*3/uL — ABNORMAL HIGH (ref 4.0–10.5)
nRBC: 0 % (ref 0.0–0.2)
nRBC: 0 /100 WBC

## 2022-04-15 LAB — PHOSPHORUS: Phosphorus: 3.7 mg/dL (ref 2.5–4.6)

## 2022-04-15 LAB — MAGNESIUM: Magnesium: 2.1 mg/dL (ref 1.7–2.4)

## 2022-04-15 MED ORDER — LOSARTAN POTASSIUM-HCTZ 50-12.5 MG PO TABS: 1.0000 | ORAL_TABLET | Freq: Every day | ORAL | Status: DC

## 2022-04-15 MED ORDER — HYDROCHLOROTHIAZIDE 12.5 MG PO TABS
12.5000 mg | ORAL_TABLET | Freq: Every day | ORAL | Status: DC
  Administered 2022-04-15 – 2022-04-16 (×2): 12.5 mg via ORAL
  Filled 2022-04-15 (×2): qty 1

## 2022-04-15 MED ORDER — LOSARTAN POTASSIUM 50 MG PO TABS
50.0000 mg | ORAL_TABLET | Freq: Every day | ORAL | Status: DC
  Administered 2022-04-15 – 2022-04-16 (×2): 50 mg via ORAL
  Filled 2022-04-15 (×2): qty 1

## 2022-04-15 NOTE — Progress Notes (Addendum)
PROGRESS NOTE    Amber [name]  PJA:[id number] DOB: 17-Jan-1941 DOA: 04/08/2022 PCP: Leamon Arnt, MD   Brief Narrative:  Amber [name] is a 81 y.o. female with PMH significant for HTN, HLD, PAD, DVT, arthritis, hypothyroidism, varicose vein, rotator cuff rupture who lives alone at home, recently treated for UTI. 11/11, patient was brought to the ED from home with urinary incontinence, frequency, altered mental status. EMS noted a low blood pressure at 70/50 which improved with normal saline hydration.   In the ED, patient had a temperature of 100.3, tachycardia, tachypnea. Labs with WC count elevated to 33.7, creatinine 1.63, lactic acid elevated to 3.3 CT of the head and x-ray chest were unremarkable. Urinalysis showed large leukocytes, positive nitrites, many bacteria CT abdomen pelvis showed left pyelonephritis, no obstructing calculi. Patient was started on IV antibiotics, IV fluids Admitted to [facility] service for sepsis due to acute left pyelonephritis Within few hours of admission, patient's blood pressure started to drop again and hence she was transferred to ICU for septic shock. Urine culture and blood culture sent on admission grew E. coli. 11/13, transferred out to [facility].   She is slowly improving but continues to complain of significant left hip pain and discomfort.  Her WBC had improved however slowly started trending back upward again.  Given that her hip continues to hurt we will blurt ADG pelvis and x-ray and notified the neurosurgery team who injected her into the bursa last week.  Because of her persistent leukocytosis without improvement ID has been consulted and they are recommending an MRI of the hip and if the MRI is negative then ID will obtain a CT of the abdomen pelvis to evaluate and look for any foci of infection.  MRI of the hip showed "No acute osseous abnormality of the left hip or pelvis. Moderate osteoarthritis of the left hip.Tendinosis with partial-thickness  insertional tears involving the bilateral gluteus medius and gluteus minimus tendons. Mild tendinosis of the bilateral hamstring tendon origins. Mild peritrochanteric bursal fluid, right greater than left. Mildly complex 3.8 cm right adnexal cyst. Further assessment with pelvic ultrasound is recommended."  Case was discussed with Dr. Rolena Infante of [facility] and he recommends outpatient follow-up with her primary orthopedic surgeon Dr. Gladstone Lighter.   Assessment and Plan:  Septic Shock - POA Acute left Pyelonephritis E. coli UTI and Bacteremia -Patient met sepsis criteria on admission.  Within few hours, blood pressure dropped significantly and hence transferred to ICU for septic shock and started on pressors.   -Eventually pressors weaned down.  Currently blood pressure is elevated. -Urine culture and blood culture sent on admission grew E. coli. -Initially started on IV Rocephin.  Switched to IV Ancef 04/11/22.   -No fever.   -Lactic acid level normalized and is now 1.9 -Continue to monitor labs. -Continue to Monitor and Trend WBC and went from 19.6 -> 21.5 -> 25.7 and finally started trending down again at 21.4 -Given her persistent leukocytosis infectious diseases has been consulted recommending an MRI of the hip and if this is negative will obtain a CT of the abdomen pelvis to look for any foci of infection; -MRI of the hip done and showed"No acute osseous abnormality of the left hip or pelvis. Moderate osteoarthritis of the left hip.Tendinosis with partial-thickness insertional tears involving the bilateral gluteus medius and gluteus minimus tendons. Mild tendinosis of the bilateral hamstring tendon origins. Mild peritrochanteric bursal fluid, right greater than left. Mildly complex 3.8 cm right adnexal cyst. Further assessment with  pelvic ultrasound is recommended." -Will refer to ID to order a CT of the abdomen pelvis given that she continues to have a elevated WBC which is now slightly  improved -DG Left Hip showed "Mild-to-moderate bilateral femoroacetabular osteoarthritis." -PT/OT Recommending SNF and has a bed available at her facility but will need to figure out the etiology of her leukocytosis and make sure that she is medically stable prior to safe discharge disposition   NSTEMI Nonobstructive CAD -Patient reported several weeks of exertional angina and dyspnea -10/12, during ambulation, she had recurrence of symptoms.  Symptoms improved with morphine IV.   -Troponin was elevated, peaked at 308.  Trend as below. -Patient was started on IV heparin drip and now stopped  -11/13, underwent cardiac cath.  Found to have mild nonobstructive CAD. -Continue Aspirin 81 mg po Daily, Atorvastatin 20 mg po Daily  and Metoprolol Tartrate 25 mg po BID and NTG 0.4 mg SL q50mnprn. Recent Labs (last 2 labs)           Recent Labs    04/09/22 1614 04/09/22 1922 04/10/22 0052  TROPONINIHS 243* 308* 192*    -She will need cardiology follow-up in outpatient setting within 1 to 2 weeks   Abnormal LFTs, improving -In the setting of above -Likely had some hypoperfusion to the liver -May need to hold her statin but will need to continue monitoring AST is now 95 -> 50 and is now 35 and ALT is now 80 -> 39 and is now 27 -Okay to monitor and trend and repeat CMP in a.m. and if necessary will obtain a right upper quadrant ultrasound and acute hepatitis panel -Repeat CMP in the a.m.   Hypertension -PTA on losartan 50 mg daily, HCTZ 12.5 mg daily -BP meds were held because of septic shock.  Blood pressure improving, in 140s this morning. -Currently on Metoprolol 25 mg twice daily.   -Continue to monitor BP per Protocol and last BP remains a little limited 169/76   Hypoalbuminemia  -Patient's Albumin Level has gone from 2.5 and trended up to 2.9 but now back down to 2.5 -Continue to Monitor and Trend and repeat CMP in the AM    History of PAD -As above C/w Aspirin, statin and  metoprolol.   Hypothyroidism -C/w Levothyroxine 50 mcg po Daily   Chronic Back Pain Lumbar radiculopathy Chronic Hip Pain  -PTA on Neurontin 100 mg 3 times daily, oxycodone 5 mg q6h as needed, Flexeril 10 mg 3 times daily as needed -Resumed all home meds but will need to make adjustments given Pain in bursa limiting Movement will increase oxycodone from 5 mg every 6hprn to 5 to 10 mg every 4 as needed; given that this did not help her improve her symptoms we will order aqua thermia and ordered DG hip and pelvis x-ray which showed "Mild-to-moderate bilateral femoroacetabular osteoarthritis." -Recently got an injection in her Left and Right Hip bursa  -Neurosurgery has been notified and Dr. JRonnald Ramphas been added to the treatment team -ID recommending obtaining an MRI of the hip: MRI of the hip done and showed "No acute osseous abnormality of the left hip or pelvis. Moderate osteoarthritis of the left hip.Tendinosis with partial-thickness insertional tears involving the bilateral gluteus medius and gluteus minimus tendons. Mild tendinosis of the bilateral hamstring tendon origins. Mild peritrochanteric bursal fluid, right greater than left. Mildly complex 3.8 cm right adnexal cyst. Further assessment with pelvic ultrasound is recommended." -I discussed the case with the orthopedic surgery physician Dr. BRolena Infante  of the [facility] and he recommends outpatient follow-up with Dr. Gladstone Lighter with no inpatient further evaluation  Right Adnexal Cyst -Noted to be mildly complex 3.8 cm on the Right on MRI -ID ordered a Pelvic U/S and showed "The ovaries are not seen. 4.9 cm simple-appearing right adnexal cyst, almost certainly benign. Consider follow-up ultrasound pelvis in 1 year to assess stability, as clinically indicated"   Mild chronic Anemia -Patient's Hgb/Hct went from 10.6/30.1 -> 11.3/32.0 -> 11.4/32.4 -> 12.5/36.0 and is now improved at 13.9/39.0 yesterday and today is 12.3/35.6 -Check Anemia Panel in  the AM -Continue to Monitor for S/Sx of Bleeding; No overt bleeding noted -Repeat CBC in the AM    Constipation -Start bowel regimen with MiraLAX 17 g p.o. twice daily, senna docusate 1 tab p.o. twice daily and bisacodyl 10 mg as needed daily   Obesity -Complicates overall prognosis and care -Estimated body mass index is 33.02 kg/m as calculated from the following:   Height as of this encounter: _0  (1.575 m).   Weight as of this encounter: 81.9 kg.  -Weight Loss and Dietary Counseling given  DVT prophylaxis: enoxaparin (LOVENOX) injection 40 mg Start: 04/11/22 0800 SCD's Start: 04/10/22 1717 SCDs Start: 04/08/22 1447    Code Status: Full Code Family Communication: Discussed with family at bedside  Disposition Plan:  Level of care: Progressive Status is: Inpatient Remains inpatient appropriate because: Needs further evaluation for leukocytosis and ensuring that she is medically stable prior to discharging to SNF   Consultants:  Infectious diseases Discussed the case with neurosurgery Discussed the case with orthopedic surgery  Procedures:  As delineated as above  Antimicrobials:  Anti-infectives (From admission, onward)    Start     Dose/Rate Route Frequency Ordered Stop   04/11/22 0600  ceFAZolin (ANCEF) IVPB 2g/100 mL premix        2 g 200 mL/hr over 30 Minutes Intravenous Every 8 hours 04/10/22 1136     04/09/22 0600  cefTRIAXone (ROCEPHIN) 2 g in sodium chloride 0.9 % 100 mL IVPB  Status:  Discontinued        2 g 200 mL/hr over 30 Minutes Intravenous Every 24 hours 04/08/22 1204 04/10/22 1136   04/08/22 0630  amoxicillin-clavulanate (AUGMENTIN) 875-125 MG per tablet 1 tablet        1 tablet Oral  Once 04/08/22 0618 04/08/22 0636   04/08/22 0600  cefTRIAXone (ROCEPHIN) 2 g in sodium chloride 0.9 % 100 mL IVPB        2 g 200 mL/hr over 30 Minutes Intravenous  Once 04/08/22 0549 04/08/22 0858       Subjective: Seen and examined and she was sitting in the chair  by the sink and she states that she slept fairly well last night.  Still having some significant hip pain.  Denies any nausea or vomiting.  Overall feels okay but states that the pain is still pretty bad.  Asking to have her pain medication adjusted to how she takes it at home every 6 hours scheduled.  No other concerns or complaints at this time.  Objective: Vitals:   04/15/22 1100 04/15/22 1229 04/15/22 1322 04/15/22 1344  BP: (!) 189/98 (!) 189/98 (!) 169/73 (!) 169/76  Pulse: 81 81 75   Resp: 18  16   Temp: 97.6 F (36.4 C)  98 F (36.7 C)   TempSrc: Oral  Oral   SpO2: 100% 98% 100%   Weight:      Height:  Intake/Output Summary (Last 24 hours) at 04/15/2022 1556 Last data filed at 04/14/2022 2342 Gross per 24 hour  Intake --  Output 1250 ml  Net -1250 ml   Filed Weights   04/13/22 0640 04/14/22 0500 04/15/22 0433  Weight: 82.6 kg 81.7 kg 81.9 kg   Examination: Physical Exam:  Constitutional: WN/WD obese Caucasian female currently no acute distress appears calm sitting in the chair by the sink still complaining of some significant left hip pain Respiratory: Diminished to auscultation bilaterally with coarse breath sounds, no wheezing, rales, rhonchi or crackles. Normal respiratory effort and patient is not tachypenic. No accessory muscle use.  Unlabored breathing Cardiovascular: RRR, no murmurs / rubs / gallops. S1 and S2 auscultated. No extremity edema.  Abdomen: Soft, non-tender, distended secondary to body habitus. Bowel sounds positive.  GU: Deferred. Musculoskeletal: No clubbing / cyanosis of digits/nails. No joint deformity upper and lower extremities.  Skin: No rashes, lesions, ulcers on limited skin evaluation. No induration; Warm and dry.  Neurologic: CN 2-12 grossly intact with no focal deficits.  Romberg sign and cerebellar reflexes not assessed.  Psychiatric: Normal judgment and insight. Alert and oriented x 3. Normal mood and appropriate affect.   Data  Reviewed: I have personally reviewed following labs and imaging studies  CBC: Recent Labs  Lab 04/11/22 0123 04/12/22 0138 04/13/22 0112 04/14/22 1026 04/15/22 0102  WBC 25.3* 19.6* 21.5* 25.7* 21.4*  NEUTROABS  --  15.1* 17.6* 21.8* 18.0*  HGB 11.3* 11.4* 12.5 13.9 12.3  HCT 32.0* 32.4* 36.0 39.0 35.6*  MCV 90.9 89.8 90.2 89.2 91.8  PLT 110* 114* 142* 193 102   Basic Metabolic Panel: Recent Labs  Lab 04/11/22 0123 04/12/22 0138 04/13/22 0112 04/14/22 1026 04/15/22 0102  NA 138 137 137 134* 136  K 3.8 3.9 4.1 4.2 4.5  CL 107 107 103 101 100  CO2 20* _0 GLUCOSE 167* 126* 128* 109* 123*  BUN 29* 34* 30* 26* 22  CREATININE 0.81 0.80 0.78 0.74 0.78  CALCIUM 8.4* 8.3* 8.6* 8.2* 8.1*  MG  --   --  1.8 2.1 2.1  PHOS  --   --  3.2 3.4 3.7   GFR: Estimated Creatinine Clearance: 52.8 mL/min (by C-G formula based on SCr of 0.78 mg/dL). Liver Function Tests: Recent Labs  Lab 04/13/22 0112 04/14/22 1026 04/15/22 0102  AST 95* 50* 35  ALT 80* 39 27  ALKPHOS 80 70 58  BILITOT 0.8 1.0 0.8  PROT 5.2* 5.8* 5.1*  ALBUMIN 2.5* 2.9* 2.5*   No results for input(s): "LIPASE", "AMYLASE" in the last 168 hours. No results for input(s): "AMMONIA" in the last 168 hours. Coagulation Profile: No results for input(s): "INR", "PROTIME" in the last 168 hours. Cardiac Enzymes: No results for input(s): "CKTOTAL", "CKMB", "CKMBINDEX", "TROPONINI" in the last 168 hours. BNP (last 3 results) No results for input(s): "PROBNP" in the last 8760 hours. HbA1C: No results for input(s): "HGBA1C" in the last 72 hours. CBG: No results for input(s): "GLUCAP" in the last 168 hours. Lipid Profile: No results for input(s): "CHOL", "HDL", "LDLCALC", "TRIG", "CHOLHDL", "LDLDIRECT" in the last 72 hours. Thyroid Function Tests: No results for input(s): "TSH", "T4TOTAL", "FREET4", "T3FREE", "THYROIDAB" in the last 72 hours. Anemia Panel: No results for input(s): "VITAMINB12", "FOLATE",  "FERRITIN", "TIBC", "IRON", "RETICCTPCT" in the last 72 hours. Sepsis Labs: Recent Labs  Lab 04/09/22 1321 04/11/22 0123  LATICACIDVEN 2.8* 1.9    Recent Results (from the past 240 hour(s))  Urine  Culture     Status: Abnormal   Collection Time: 04/08/22  5:28 AM   Specimen: Urine, Catheterized  Result Value Ref Range Status   Specimen Description URINE, CATHETERIZED  Final   Special Requests   Final    NONE Performed at [facility], [address], [place], [place] [address]    Culture >=100,000 COLONIES/mL ESCHERICHIA COLI (A)  Final   Report Status 04/10/2022 FINAL  Final   Organism ID, Bacteria ESCHERICHIA COLI (A)  Final      Susceptibility   Escherichia coli - MIC*    AMPICILLIN <=2 SENSITIVE Sensitive     CEFAZOLIN <=4 SENSITIVE Sensitive     CEFEPIME <=0.12 SENSITIVE Sensitive     CEFTRIAXONE <=0.25 SENSITIVE Sensitive     CIPROFLOXACIN <=0.25 SENSITIVE Sensitive     GENTAMICIN <=1 SENSITIVE Sensitive     IMIPENEM <=0.25 SENSITIVE Sensitive     NITROFURANTOIN <=16 SENSITIVE Sensitive     TRIMETH/SULFA <=20 SENSITIVE Sensitive     AMPICILLIN/SULBACTAM <=2 SENSITIVE Sensitive     PIP/TAZO <=4 SENSITIVE Sensitive     * >=100,000 COLONIES/mL ESCHERICHIA COLI  Blood culture (routine x 2)     Status: Abnormal   Collection Time: 04/08/22  6:40 AM   Specimen: BLOOD  Result Value Ref Range Status   Specimen Description BLOOD RIGHT ANTECUBITAL  Final   Special Requests   Final    BOTTLES DRAWN AEROBIC AND ANAEROBIC Blood Culture adequate volume   Culture  Setup Time   Final    IN BOTH AEROBIC AND ANAEROBIC BOTTLES GRAM NEGATIVE RODS Organism ID to follow CRITICAL RESULT CALLED TO, READ BACK BY AND VERIFIED WITH:  C/ PHARMD L. CHEN 04/08/22 1920 A. LAFRANCE  Performed at [facility], [place] [address], [place], [place] [address]    Culture ESCHERICHIA COLI (A)  Final   Report Status 04/10/2022 FINAL  Final   Organism ID, Bacteria ESCHERICHIA COLI  Final       Susceptibility   Escherichia coli - MIC*    AMPICILLIN <=2 SENSITIVE Sensitive     CEFAZOLIN <=4 SENSITIVE Sensitive     CEFEPIME <=0.12 SENSITIVE Sensitive     CEFTAZIDIME <=1 SENSITIVE Sensitive     CEFTRIAXONE <=0.25 SENSITIVE Sensitive     CIPROFLOXACIN <=0.25 SENSITIVE Sensitive     GENTAMICIN <=1 SENSITIVE Sensitive     IMIPENEM <=0.25 SENSITIVE Sensitive     TRIMETH/SULFA <=20 SENSITIVE Sensitive     AMPICILLIN/SULBACTAM <=2 SENSITIVE Sensitive     PIP/TAZO <=4 SENSITIVE Sensitive     * ESCHERICHIA COLI  Blood Culture ID Panel (Reflexed)     Status: Abnormal   Collection Time: 04/08/22  6:40 AM  Result Value Ref Range Status   Enterococcus faecalis NOT DETECTED NOT DETECTED Final   Enterococcus Faecium NOT DETECTED NOT DETECTED Final   Listeria monocytogenes NOT DETECTED NOT DETECTED Final   Staphylococcus species NOT DETECTED NOT DETECTED Final   Staphylococcus aureus (BCID) NOT DETECTED NOT DETECTED Final   Staphylococcus epidermidis NOT DETECTED NOT DETECTED Final   Staphylococcus lugdunensis NOT DETECTED NOT DETECTED Final   Streptococcus species NOT DETECTED NOT DETECTED Final   Streptococcus agalactiae NOT DETECTED NOT DETECTED Final   Streptococcus pneumoniae NOT DETECTED NOT DETECTED Final   Streptococcus pyogenes NOT DETECTED NOT DETECTED Final   A.calcoaceticus-baumannii NOT DETECTED NOT DETECTED Final   Bacteroides fragilis NOT DETECTED NOT DETECTED Final   Enterobacterales DETECTED (A) NOT DETECTED Final    Comment: Enterobacterales  represent a large order of gram negative bacteria, not a single organism. CRITICAL RESULT CALLED TO, READ BACK BY AND VERIFIED WITH:  C/ PHARMD L. CHEN 04/08/22 1920 A. LAFRANCE     Enterobacter cloacae complex NOT DETECTED NOT DETECTED Final   Escherichia coli DETECTED (A) NOT DETECTED Final    Comment: CRITICAL RESULT CALLED TO, READ BACK BY AND VERIFIED WITH:  C/ PHARMD L. CHEN 04/08/22 1920 A. LAFRANCE     Klebsiella  aerogenes NOT DETECTED NOT DETECTED Final   Klebsiella oxytoca NOT DETECTED NOT DETECTED Final   Klebsiella pneumoniae NOT DETECTED NOT DETECTED Final   Proteus species NOT DETECTED NOT DETECTED Final   Salmonella species NOT DETECTED NOT DETECTED Final   Serratia marcescens NOT DETECTED NOT DETECTED Final   Haemophilus influenzae NOT DETECTED NOT DETECTED Final   Neisseria meningitidis NOT DETECTED NOT DETECTED Final   Pseudomonas aeruginosa NOT DETECTED NOT DETECTED Final   Stenotrophomonas maltophilia NOT DETECTED NOT DETECTED Final   Candida albicans NOT DETECTED NOT DETECTED Final   Candida auris NOT DETECTED NOT DETECTED Final   Candida glabrata NOT DETECTED NOT DETECTED Final   Candida krusei NOT DETECTED NOT DETECTED Final   Candida parapsilosis NOT DETECTED NOT DETECTED Final   Candida tropicalis NOT DETECTED NOT DETECTED Final   Cryptococcus neoformans/gattii NOT DETECTED NOT DETECTED Final   CTX-M ESBL NOT DETECTED NOT DETECTED Final   Carbapenem resistance IMP NOT DETECTED NOT DETECTED Final   Carbapenem resistance KPC NOT DETECTED NOT DETECTED Final   Carbapenem resistance NDM NOT DETECTED NOT DETECTED Final   Carbapenem resist OXA 48 LIKE NOT DETECTED NOT DETECTED Final   Carbapenem resistance VIM NOT DETECTED NOT DETECTED Final    Comment: Performed at [facility], [address] [place], [place] [address]  MRSA Next Gen by PCR, Nasal     Status: None   Collection Time: 04/08/22  4:35 PM   Specimen: Nasal Mucosa; Nasal Swab  Result Value Ref Range Status   MRSA by PCR Next Gen NOT DETECTED NOT DETECTED Final    Comment: (NOTE) The GeneXpert MRSA Assay (FDA approved for NASAL specimens only), is one component of a comprehensive MRSA colonization surveillance program. It is not intended to diagnose MRSA infection nor to guide or monitor treatment for MRSA infections. Test performance is not FDA approved in patients less than 67 years old. Performed at [facility], [place] [address], [place], [place] [address]   Blood culture (routine x 2)     Status: None   Collection Time: 04/08/22  5:07 PM   Specimen: BLOOD LEFT HAND  Result Value Ref Range Status   Specimen Description BLOOD LEFT HAND  Final   Special Requests   Final    BOTTLES DRAWN AEROBIC AND ANAEROBIC Blood Culture results may not be optimal due to an inadequate volume of blood received in culture bottles   Culture   Final    NO GROWTH 5 DAYS Performed at [facility], [place] [address], [place], [place] [address]    Report Status 04/13/2022 FINAL  Final    Radiology Studies: US PELVIS (TRANSABDOMINAL ONLY)  Result Date: 04/15/2022 CLINICAL DATA:  Right adnexal cyst noted on prior CT dated 04/08/2022 EXAM: TRANSABDOMINAL ULTRASOUND OF PELVIS TECHNIQUE: Transabdominal ultrasound examination of the pelvis was performed including evaluation of the uterus, ovaries, adnexal regions, and pelvic cul-de-sac. COMPARISON:  CT abdomen and pelvis dated 04/08/2022 FINDINGS: Uterus Surgically absent. Right ovary Right adnexal  simple-appearing cyst measures 4.9 x 4.0 x 3.0 cm. The right ovary is not seen. Left ovary Left ovary is not seen. Other findings:  No abnormal free fluid. IMPRESSION: The ovaries are not seen. 4.9 cm simple-appearing right adnexal cyst, almost certainly benign. Consider follow-up ultrasound pelvis in 1 year to assess stability, as clinically indicated. Electronically Signed   By: Darrin Nipper M.D.   On: 04/15/2022 13:51   MR HIP LEFT W WO CONTRAST  Result Date: 04/15/2022 CLINICAL DATA:  Left hip pain after fall EXAM: MRI OF THE LEFT HIP WITHOUT AND WITH CONTRAST TECHNIQUE: Multiplanar, multisequence MR imaging was performed both before and after administration of intravenous contrast. CONTRAST:  7.78m GADAVIST GADOBUTROL 1 MMOL/ML IV SOLN COMPARISON:  X-ray 04/14/2022, CT 04/08/2022 FINDINGS: Bones: No acute fracture. No dislocation. No femoral head avascular necrosis.  Bony pelvis intact without diastasis. SI joints and pubic symphysis within normal limits. Partially imaged lumbar fusion hardware with associated susceptibility artifact. There is degenerative disc disease at the L5-S1 level, not well assessed on the included imaging planes. No extra-articular sites of bone marrow edema. No marrow replacing bone lesion. Articular cartilage and labrum Articular cartilage: Moderate osteoarthritis of the left hip with diffuse cartilage thinning and surface irregularity. No subchondral marrow signal changes. Labrum:  Superior labral degeneration.  No paralabral cyst. Joint or bursal effusion Joint effusion:  None. Bursae: Small amount of peritrochanteric bursal fluid, right greater than left. Muscles and tendons Muscles and tendons: Tendinosis with partial-thickness insertional tears involving the bilateral gluteus medius and gluteus minimus tendons. Mild tendinosis of the bilateral hamstring tendon origins. The iliopsoas, rectus femoris, and adductor tendons appear intact without tear or significant tendinosis. Mild generalized intramuscular edema. No intramuscular fluid collection. Preserved muscle bulk atrophy or fatty infiltration. Other findings Miscellaneous: Mild diffuse subcutaneous edema. No organized fluid collection or hematoma. No inguinal lymphadenopathy. 3.8 cm right adnexal cyst with internal septations. IMPRESSION: 1. No acute osseous abnormality of the left hip or pelvis. 2. Moderate osteoarthritis of the left hip. 3. Tendinosis with partial-thickness insertional tears involving the bilateral gluteus medius and gluteus minimus tendons. 4. Mild tendinosis of the bilateral hamstring tendon origins. 5. Mild peritrochanteric bursal fluid, right greater than left. 6. Mildly complex 3.8 cm right adnexal cyst. Further assessment with pelvic ultrasound is recommended. Electronically Signed   By: NDavina PokeD.O.   On: 04/15/2022 09:46   DG HIP UNILAT WITH PELVIS 2-3  VIEWS LEFT  Result Date: 04/14/2022 CLINICAL DATA:  Left hip pain. EXAM: DG HIP (WITH OR WITHOUT PELVIS) 2-3V LEFT COMPARISON:  CT abdomen and pelvis 04/08/2022 FINDINGS: There is diffuse decreased bone mineralization. Mild-to-moderate bilateral femoroacetabular joint space narrowing and superolateral acetabular degenerative osteophytes. The bilateral sacroiliac and pubic symphysis joint spaces are maintained. No acute fracture is seen. No dislocation. Partial visualization of lumbar spine transpedicular rod and screw fusion hardware. Mild vascular calcifications. IMPRESSION: Mild-to-moderate bilateral femoroacetabular osteoarthritis. Electronically Signed   By: RYvonne KendallM.D.   On: 04/14/2022 13:03    Scheduled Meds:  aspirin EC  81 mg Oral Daily   atorvastatin  20 mg Oral Daily   enoxaparin (LOVENOX) injection  40 mg Subcutaneous Q24H   gabapentin  100 mg Oral TID   levothyroxine  50 mcg Oral Q0600   metoprolol tartrate  25 mg Oral BID   pantoprazole  40 mg Oral Daily   polyethylene glycol  17 g Oral BID   senna-docusate  1 tablet Oral BID   sodium chloride flush  3 mL Intravenous Q12H   sodium chloride flush  3 mL Intravenous Q12H   sodium chloride flush  3 mL Intravenous Q12H   Continuous Infusions:  sodium chloride 250 mL (04/14/22 2154)   sodium chloride      ceFAZolin (ANCEF) IV 2 g (04/15/22 1334)    LOS: 7 days   Raiford Noble, DO [facility] Available via Epic secure chat 7am-7pm After these hours, please refer to coverage provider listed on [email or web address] 04/15/2022, 3:56 PM

## 2022-04-15 NOTE — Progress Notes (Signed)
Called in regards to this patients hip pain. I did do a bursa injection last week on her which typically helps with her pain. However, we do not treat hip pain. We treat spines. I do this as a favor for her in the office. If she is having hip pain she needs to be worked up with an MRI etc. If she has pathology then ortho needs to be consulted.

## 2022-04-16 DIAGNOSIS — I2510 Atherosclerotic heart disease of native coronary artery without angina pectoris: Secondary | ICD-10-CM | POA: Insufficient documentation

## 2022-04-16 DIAGNOSIS — G8929 Other chronic pain: Secondary | ICD-10-CM | POA: Insufficient documentation

## 2022-04-16 DIAGNOSIS — E8809 Other disorders of plasma-protein metabolism, not elsewhere classified: Secondary | ICD-10-CM | POA: Insufficient documentation

## 2022-04-16 DIAGNOSIS — A419 Sepsis, unspecified organism: Secondary | ICD-10-CM | POA: Diagnosis not present

## 2022-04-16 DIAGNOSIS — I959 Hypotension, unspecified: Secondary | ICD-10-CM | POA: Diagnosis not present

## 2022-04-16 DIAGNOSIS — I739 Peripheral vascular disease, unspecified: Secondary | ICD-10-CM | POA: Insufficient documentation

## 2022-04-16 DIAGNOSIS — M25559 Pain in unspecified hip: Secondary | ICD-10-CM | POA: Insufficient documentation

## 2022-04-16 DIAGNOSIS — E669 Obesity, unspecified: Secondary | ICD-10-CM | POA: Insufficient documentation

## 2022-04-16 DIAGNOSIS — R7989 Other specified abnormal findings of blood chemistry: Secondary | ICD-10-CM | POA: Insufficient documentation

## 2022-04-16 DIAGNOSIS — N390 Urinary tract infection, site not specified: Secondary | ICD-10-CM | POA: Diagnosis not present

## 2022-04-16 DIAGNOSIS — K5900 Constipation, unspecified: Secondary | ICD-10-CM | POA: Insufficient documentation

## 2022-04-16 DIAGNOSIS — M5416 Radiculopathy, lumbar region: Secondary | ICD-10-CM | POA: Insufficient documentation

## 2022-04-16 DIAGNOSIS — R7881 Bacteremia: Secondary | ICD-10-CM | POA: Insufficient documentation

## 2022-04-16 DIAGNOSIS — D72829 Elevated white blood cell count, unspecified: Secondary | ICD-10-CM | POA: Insufficient documentation

## 2022-04-16 DIAGNOSIS — N179 Acute kidney failure, unspecified: Secondary | ICD-10-CM | POA: Diagnosis not present

## 2022-04-16 LAB — COMPREHENSIVE METABOLIC PANEL
ALT: 19 U/L (ref 0–44)
AST: 21 U/L (ref 15–41)
Albumin: 2.6 g/dL — ABNORMAL LOW (ref 3.5–5.0)
Alkaline Phosphatase: 56 U/L (ref 38–126)
Anion gap: 10 (ref 5–15)
BUN: 22 mg/dL (ref 8–23)
CO2: 25 mmol/L (ref 22–32)
Calcium: 8.1 mg/dL — ABNORMAL LOW (ref 8.9–10.3)
Chloride: 98 mmol/L (ref 98–111)
Creatinine, Ser: 0.74 mg/dL (ref 0.44–1.00)
GFR, Estimated: >60 mL/min (ref >60)
Glucose, Bld: 153 mg/dL — ABNORMAL HIGH (ref 70–99)
Potassium: 4.1 mmol/L (ref 3.5–5.1)
Sodium: 133 mmol/L — ABNORMAL LOW (ref 135–145)
Total Bilirubin: 0.7 mg/dL (ref 0.3–1.2)
Total Protein: 5.3 g/dL — ABNORMAL LOW (ref 6.5–8.1)

## 2022-04-16 LAB — CBC WITH DIFFERENTIAL/PLATELET
Abs Immature Granulocytes: 0.58 10*3/uL — ABNORMAL HIGH (ref 0.00–0.07)
Basophils Absolute: 0.1 10*3/uL (ref 0.0–0.1)
Basophils Relative: 1 %
Eosinophils Absolute: 0 10*3/uL (ref 0.0–0.5)
Eosinophils Relative: 0 %
HCT: 36.3 % (ref 36.0–46.0)
Hemoglobin: 12.5 g/dL (ref 12.0–15.0)
Immature Granulocytes: 3 %
Lymphocytes Relative: 13 %
Lymphs Abs: 2.4 10*3/uL (ref 0.7–4.0)
MCH: 31.1 pg (ref 26.0–34.0)
MCHC: 34.4 g/dL (ref 30.0–36.0)
MCV: 90.3 fL (ref 80.0–100.0)
Monocytes Absolute: 1.4 10*3/uL — ABNORMAL HIGH (ref 0.1–1.0)
Monocytes Relative: 8 %
Neutro Abs: 13.7 10*3/uL — ABNORMAL HIGH (ref 1.7–7.7)
Neutrophils Relative %: 75 %
Platelets: 191 10*3/uL (ref 150–400)
RBC: 4.02 MIL/uL (ref 3.87–5.11)
RDW: 14.6 % (ref 11.5–15.5)
WBC: 18.2 10*3/uL — ABNORMAL HIGH (ref 4.0–10.5)
nRBC: 0 % (ref 0.0–0.2)

## 2022-04-16 LAB — MAGNESIUM: Magnesium: 2.1 mg/dL (ref 1.7–2.4)

## 2022-04-16 LAB — PHOSPHORUS: Phosphorus: 3.5 mg/dL (ref 2.5–4.6)

## 2022-04-16 MED ORDER — NITROGLYCERIN 0.4 MG SL SUBL: 0.4000 mg | SUBLINGUAL_TABLET | SUBLINGUAL | 12 refills | Status: AC | PRN

## 2022-04-16 MED ORDER — ONDANSETRON HCL 4 MG PO TABS: 4.0000 mg | ORAL_TABLET | Freq: Four times a day (QID) | ORAL | 0 refills | Status: AC | PRN

## 2022-04-16 MED ORDER — POLYETHYLENE GLYCOL 3350 17 G PO PACK: 17.0000 g | PACK | Freq: Every day | ORAL | 0 refills | Status: AC

## 2022-04-16 MED ORDER — CEFADROXIL 500 MG PO CAPS: 500.0000 mg | ORAL_CAPSULE | Freq: Two times a day (BID) | ORAL | 0 refills | Status: AC

## 2022-04-16 MED ORDER — SENNOSIDES-DOCUSATE SODIUM 8.6-50 MG PO TABS: 1.0000 | ORAL_TABLET | Freq: Every day | ORAL | 0 refills | Status: DC

## 2022-04-16 MED ORDER — CEFADROXIL 500 MG PO CAPS
500.0000 mg | ORAL_CAPSULE | Freq: Two times a day (BID) | ORAL | Status: DC
  Administered 2022-04-16: 500 mg via ORAL
  Filled 2022-04-16 (×2): qty 1

## 2022-04-16 MED ORDER — METOPROLOL TARTRATE 25 MG PO TABS: 25.0000 mg | ORAL_TABLET | Freq: Two times a day (BID) | ORAL | 0 refills | Status: AC

## 2022-04-16 MED ORDER — OXYCODONE HCL 5 MG PO TABS: 5.0000 mg | ORAL_TABLET | Freq: Four times a day (QID) | ORAL | 0 refills | Status: AC | PRN

## 2022-04-16 MED ORDER — ASPIRIN 81 MG PO TBEC: 81.0000 mg | DELAYED_RELEASE_TABLET | Freq: Every day | ORAL | 12 refills | Status: AC

## 2022-04-16 NOTE — Discharge Summary (Signed)
Physician Discharge Summary   Patient: Amber [name] MRN: [id number] DOB: [birth year]  Admit date:     04/08/2022  Discharge date: 04/16/22  Discharge Physician: Raiford Noble, DO   PCP: Leamon Arnt, MD   Recommendations at discharge:   Follow-up with PCP within 1 to 2 weeks and repeat CBC, CMP, mag, Phos within 1 week Follow-up with cardiology in outpatient setting in 4 to 6 weeks Follow-up with orthopedic surgery Dr. Gladstone Lighter within 1 to 2 weeks Follow-up with infectious diseases if necessary and continue antibiotic course until completion Follow-up with Neurosurgery in outpatient setting  Discharge Diagnoses: Principal Problem:   E. coli UTI (urinary tract infection) Active Problems:   Severe sepsis (HCC)   Transient hypotension   Acute metabolic encephalopathy   Cellulitis, face   Elevated troponin   AKI (acute kidney injury) ([place])   Complete left bundle branch block   Normocytic anemia   Thrombocytopenia (HCC)   Hyponatremia   Sepsis ([place])   NSTEMI (non-ST elevated myocardial infarction) ([place])   Chest pain of uncertain etiology   Leukocytosis   Septic shock (HCC)   Hypoalbuminemia   E coli bacteremia   Coronary artery disease   Abnormal LFTs   PAD (peripheral artery disease) (HCC)   Chronic back pain   Lumbar radiculopathy   Hip pain   Constipation  Resolved Problems:   * No resolved hospital problems. *  Hospital Course: CACI ORREN is a 81 y.o. female with PMH significant for HTN, HLD, PAD, DVT, arthritis, hypothyroidism, varicose vein, rotator cuff rupture who lives alone at home, recently treated for UTI. 11/11, patient was brought to the ED from home with urinary incontinence, frequency, altered mental status. EMS noted a low blood pressure at 70/50 which improved with normal saline hydration.   In the ED, patient had a temperature of 100.3, tachycardia, tachypnea. Labs with WC count elevated to 33.7, creatinine 1.63, lactic acid elevated to 3.3 CT  of the head and x-ray chest were unremarkable. Urinalysis showed large leukocytes, positive nitrites, many bacteria CT abdomen pelvis showed left pyelonephritis, no obstructing calculi. Patient was started on IV antibiotics, IV fluids Admitted to [facility] service for sepsis due to acute left pyelonephritis Within few hours of admission, patient's blood pressure started to drop again and hence she was transferred to ICU for septic shock. Urine culture and blood culture sent on admission grew E. coli. 11/13, transferred out to [facility].   She is slowly improving but continues to complain of significant left hip pain and discomfort.  Her WBC had improved however slowly started trending back upward again.  Given that her hip continues to hurt we will blurt ADG pelvis and x-ray and notified the neurosurgery team who injected her into the bursa last week.  Because of her persistent leukocytosis without improvement ID has been consulted and they are recommending an MRI of the hip and if the MRI is negative then ID will obtain a CT of the abdomen pelvis to evaluate and look for any foci of infection.   MRI of the hip showed "No acute osseous abnormality of the left hip or pelvis. Moderate osteoarthritis of the left hip.Tendinosis with partial-thickness insertional tears involving the bilateral gluteus medius and gluteus minimus tendons. Mild tendinosis of the bilateral hamstring tendon origins. Mild peritrochanteric bursal fluid, right greater than left. Mildly complex 3.8 cm right adnexal cyst. Further assessment with pelvic ultrasound is recommended."   Case was discussed with Dr. Rolena Infante of [facility] and he recommends  outpatient follow-up with her primary orthopedic surgeon Dr. Gladstone Lighter.  She is improved and ID recommends changing to oral cefadroxil.  She is deemed medically stable at this time and will need to follow-up with PCP, cardiology as well as orthopedic surgery in outpatient setting  Assessment and  Plan:  Septic Shock - POA Acute left Pyelonephritis E. coli UTI and Bacteremia -Patient met sepsis criteria on admission.  Within few hours, blood pressure dropped significantly and hence transferred to ICU for septic shock and started on pressors.   -Eventually pressors weaned down.  Currently blood pressure is elevated. -Urine culture and blood culture sent on admission grew E. coli. -Initially started on IV Rocephin.  Switched to IV Ancef 04/11/22.   -No fever.   -Lactic acid level normalized and is now 1.9 -Continue to monitor labs. -Continue to Monitor and Trend WBC and went from 19.6 -> 21.5 -> 25.7 and finally started trending down again at 21.4 -> 18.2 -Given her persistent leukocytosis infectious diseases has been consulted recommending an MRI of the hip and if this is negative will obtain a CT of the abdomen pelvis to look for any foci of infection; -MRI of the hip done and showed"No acute osseous abnormality of the left hip or pelvis. Moderate osteoarthritis of the left hip.Tendinosis with partial-thickness insertional tears involving the bilateral gluteus medius and gluteus minimus tendons. Mild tendinosis of the bilateral hamstring tendon origins. Mild peritrochanteric bursal fluid, right greater than left. Mildly complex 3.8 cm right adnexal cyst. Further assessment with pelvic ultrasound is recommended." -Will refer to ID to order a CT of the abdomen pelvis given that she continues to have a elevated WBC which is now slightly improved -DG Left Hip showed "Mild-to-moderate bilateral femoroacetabular osteoarthritis." -PT/OT Recommending SNF and has a bed available at her facility but will need to figure out the etiology of her leukocytosis and make sure that she is medically stable prior to safe discharge disposition -I Personally spoke with infectious diseases Dr. Candiss Norse who recommends transitioning the patient to p.o. cefadroxil 500 mg twice daily for completion of course given that  leukocytosis is trending down and she has no further recommendations at this time   NSTEMI Nonobstructive CAD -Patient reported several weeks of exertional angina and dyspnea -10/12, during ambulation, she had recurrence of symptoms.  Symptoms improved with morphine IV.   -Troponin was elevated, peaked at 308.  Trend as below. -Patient was started on IV heparin drip and now stopped  -11/13, underwent cardiac cath.  Found to have mild nonobstructive CAD. -Continue Aspirin 81 mg po Daily, Atorvastatin 20 mg po Daily  and Metoprolol Tartrate 25 mg po BID and NTG 0.4 mg SL q51mnprn. Recent Labs (last 2 labs)           Recent Labs    04/09/22 1614 04/09/22 1922 04/10/22 0052  TROPONINIHS 243* 308* 192*    -She will need cardiology follow-up in outpatient setting within 4-6 weeks    Abnormal LFTs, improving -In the setting of above -Likely had some hypoperfusion to the liver -May need to hold her statin but will need to continue monitoring AST is now 95 -> 50 -> 35 -> 21 and ALT is now 80 -> 39 -> 27 -> 19 -Okay to monitor and trend and repeat CMP in a.m. and if necessary will obtain a right upper quadrant ultrasound and acute hepatitis panel -Repeat CMP in the a.m.   Hypertension -PTA on losartan 50 mg daily, HCTZ 12.5 mg daily -BP  meds were held because of septic shock.  Blood pressure improving, in 140s this morning. -Currently on Metoprolol 25 mg twice daily.   -Continue to monitor BP per Protocol and last BP is now 148/65   Hypoalbuminemia  -Patient's Albumin Level has gone from 2.5 -> 2.9 -> 2.5 -> 2.6 -Continue to Monitor and Trend and repeat CMP in the AM    History of PAD -As above C/w Aspirin, statin and metoprolol.   Hypothyroidism -C/w Levothyroxine 50 mcg po Daily   Chronic Back Pain Lumbar radiculopathy Chronic Hip Pain  -PTA on Neurontin 100 mg 3 times daily, oxycodone 5 mg q6h as needed, Flexeril 10 mg 3 times daily as needed -Resumed all home meds but will  need to make adjustments given Pain in bursa limiting Movement will increase oxycodone from 5 mg every 6hprn to 5 to 10 mg every 4 as needed; given that this did not help her improve her symptoms we will order aqua thermia and ordered DG hip and pelvis x-ray which showed "Mild-to-moderate bilateral femoroacetabular osteoarthritis." -Recently got an injection in her Left and Right Hip bursa  -Neurosurgery has been notified and Dr. Ronnald Ramp has been added to the treatment team -ID recommending obtaining an MRI of the hip: MRI of the hip done and showed "No acute osseous abnormality of the left hip or pelvis. Moderate osteoarthritis of the left hip.Tendinosis with partial-thickness insertional tears involving the bilateral gluteus medius and gluteus minimus tendons. Mild tendinosis of the bilateral hamstring tendon origins. Mild peritrochanteric bursal fluid, right greater than left. Mildly complex 3.8 cm right adnexal cyst. Further assessment with pelvic ultrasound is recommended." -I discussed the case with the orthopedic surgery physician Dr. Rolena Infante of the [facility] and he recommends outpatient follow-up with Dr. Gladstone Lighter with no inpatient further evaluation and she can follow-up with him within 1 to 2 weeks   Right Adnexal Cyst -Noted to be mildly complex 3.8 cm on the Right on MRI -ID ordered a Pelvic U/S and showed "The ovaries are not seen. 4.9 cm simple-appearing right adnexal cyst, almost certainly benign. Consider follow-up ultrasound pelvis in 1 year to assess stability, as clinically indicated" -Follow-up with ultrasound in 1 year as an outpatient  Hyponatremia -Mild and Na+ went from 134 -> 136 -> 133 -Continue to Monitor and Trend and repeat CMP within 1 week   Mild chronic Anemia -Patient's Hgb/Hct went from 10.6/30.1 -> 11.3/32.0 -> 11.4/32.4 -> 12.5/36.0 and is now improved at 13.9/39.0 -> 12.3/35.6 and is now 12.5/36.3 -Check Anemia Panel in the outpatient setting -Continue to Monitor  for S/Sx of Bleeding; No overt bleeding noted -Repeat CBC in the AM    Constipation -Start bowel regimen with MiraLAX 17 g p.o. twice daily, senna docusate 1 tab p.o. twice daily and bisacodyl 10 mg as needed daily -GI bowel regimen at discharge   Obesity -Complicates overall prognosis and care -Estimated body mass index is 33.06 kg/m as calculated from the following:   Height as of this encounter: _0  (1.575 m).   Weight as of this encounter: 82 kg.  -Weight Loss and Dietary Counseling given  Consultants: Cardiology, ID, Neurosurgery, Discussed with Orthopedic Surgery  Procedures performed: As above  Disposition: Skilled nursing facility Diet recommendation:  Discharge Diet Orders (From admission, onward)     Start     Ordered   04/16/22 0000  Diet - low sodium heart healthy        04/16/22 1147  Cardiac diet DISCHARGE MEDICATION: Allergies as of 04/16/2022       Reactions   Codeine Nausea And Vomiting   Darvocet [propoxyphene N-acetaminophen] Nausea And Vomiting   Vicodin [hydrocodone-acetaminophen] Other (See Comments)   Unknown reactions   Nickel Rash   Sulfa Antibiotics Rash   Like a sunburn        Medication List     TAKE these medications    acetaminophen 500 MG tablet Commonly known as: TYLENOL Take 1,000 mg by mouth 3 (three) times daily.   aspirin EC 81 MG tablet Take 1 tablet (81 mg total) by mouth daily. Swallow whole. Start taking on: April 17, 2022   atorvastatin 20 MG tablet Commonly known as: LIPITOR TAKE 1 TABLET AT BEDTIME What changed: when to take this   cefadroxil 500 MG capsule Commonly known as: DURICEF Take 1 capsule (500 mg total) by mouth 2 (two) times daily for 5 days.   clobetasol ointment 0.05 % Commonly known as: TEMOVATE Apply 1 Application topically daily as needed (for eczema).   cyclobenzaprine 10 MG tablet Commonly known as: FLEXERIL Take 1 tablet (10 mg total) by mouth 3 (three) times daily as  needed for muscle spasms.   gabapentin 100 MG capsule Commonly known as: NEURONTIN TAKE 1 CAPSULE(100 MG) BY MOUTH THREE TIMES DAILY What changed: See the new instructions.   levothyroxine 50 MCG tablet Commonly known as: SYNTHROID Take 1 tablet (50 mcg total) by mouth daily before breakfast.   losartan-hydrochlorothiazide 50-12.5 MG tablet Commonly known as: HYZAAR TAKE 1 TABLET DAILY   metoprolol tartrate 25 MG tablet Commonly known as: LOPRESSOR Take 1 tablet (25 mg total) by mouth 2 (two) times daily.   nitroGLYCERIN 0.4 MG SL tablet Commonly known as: NITROSTAT Place 1 tablet (0.4 mg total) under the tongue every 5 (five) minutes as needed for chest pain.   nystatin powder Commonly known as: MYCOSTATIN/NYSTOP Apply 1 Application topically daily as needed (for rash/redness).   ondansetron 4 MG tablet Commonly known as: ZOFRAN Take 1 tablet (4 mg total) by mouth every 6 (six) hours as needed for nausea.   oxyCODONE 5 MG immediate release tablet Commonly known as: Oxy IR/ROXICODONE Take 1 tablet (5 mg total) by mouth every 6 (six) hours as needed for severe pain. What changed: when to take this   pantoprazole 40 MG tablet Commonly known as: PROTONIX TAKE 1 TABLET(40 MG) BY MOUTH DAILY What changed: See the new instructions.   polyethylene glycol 17 g packet Commonly known as: MIRALAX / GLYCOLAX Take 17 g by mouth daily.   polyvinyl alcohol 1.4 % ophthalmic solution Commonly known as: LIQUIFILM TEARS Place 1 drop into both eyes daily as needed for dry eyes.   PRESERVISION AREDS PO Take 1 tablet by mouth 2 (two) times daily.   senna-docusate 8.6-50 MG tablet Commonly known as: Senokot-S Take 1 tablet by mouth at bedtime.   Vitamin D3 25 MCG (1000 UT) Caps Take 1,000 Units by mouth daily.   Vitamin-B Complex Tabs Take 1 tablet by mouth daily.        Discharge Exam: Filed Weights   04/14/22 0500 04/15/22 0433 04/16/22 0500  Weight: 81.7 kg 81.9 kg 82  kg   Vitals:   04/16/22 0621 04/16/22 0739  BP: (!) 154/70 (!) 148/65  Pulse: 77 74  Resp: 13 14  Temp: 98.1 F (36.7 C) 98.1 F (36.7 C)  SpO2: 99% 97%   Examination: Physical Exam:  Constitutional: WN/WD obese Caucasian female currently no  acute distress Respiratory: Diminished to auscultation bilaterally, no wheezing, rales, rhonchi or crackles. Normal respiratory effort and patient is not tachypenic. No accessory muscle use.  Unlabored breathing Cardiovascular: RRR, no murmurs / rubs / gallops. S1 and S2 auscultated. No extremity edema..  Abdomen: Soft, non-tender, distended secondary body habitus. Bowel sounds positive.  GU: Deferred. Musculoskeletal: No clubbing / cyanosis of digits/nails. No joint deformity upper and lower extremities.  Skin: No rashes, lesions, ulcers on limited skin evaluation. No induration; Warm and dry.  Neurologic: CN 2-12 grossly intact with no focal deficits. Romberg sign and cerebellar reflexes not assessed.  Psychiatric: Normal judgment and insight. Alert and oriented x 3. Normal mood and appropriate affect.   Condition at discharge: stable  The results of significant diagnostics from this hospitalization (including imaging, microbiology, ancillary and laboratory) are listed below for reference.   Imaging Studies: US PELVIS (TRANSABDOMINAL ONLY)  Result Date: 04/15/2022 CLINICAL DATA:  Right adnexal cyst noted on prior CT dated 04/08/2022 EXAM: TRANSABDOMINAL ULTRASOUND OF PELVIS TECHNIQUE: Transabdominal ultrasound examination of the pelvis was performed including evaluation of the uterus, ovaries, adnexal regions, and pelvic cul-de-sac. COMPARISON:  CT abdomen and pelvis dated 04/08/2022 FINDINGS: Uterus Surgically absent. Right ovary Right adnexal simple-appearing cyst measures 4.9 x 4.0 x 3.0 cm. The right ovary is not seen. Left ovary Left ovary is not seen. Other findings:  No abnormal free fluid. IMPRESSION: The ovaries are not seen. 4.9 cm  simple-appearing right adnexal cyst, almost certainly benign. Consider follow-up ultrasound pelvis in 1 year to assess stability, as clinically indicated. Electronically Signed   By: Darrin Nipper M.D.   On: 04/15/2022 13:51   MR HIP LEFT W WO CONTRAST  Result Date: 04/15/2022 CLINICAL DATA:  Left hip pain after fall EXAM: MRI OF THE LEFT HIP WITHOUT AND WITH CONTRAST TECHNIQUE: Multiplanar, multisequence MR imaging was performed both before and after administration of intravenous contrast. CONTRAST:  7.29m GADAVIST GADOBUTROL 1 MMOL/ML IV SOLN COMPARISON:  X-ray 04/14/2022, CT 04/08/2022 FINDINGS: Bones: No acute fracture. No dislocation. No femoral head avascular necrosis. Bony pelvis intact without diastasis. SI joints and pubic symphysis within normal limits. Partially imaged lumbar fusion hardware with associated susceptibility artifact. There is degenerative disc disease at the L5-S1 level, not well assessed on the included imaging planes. No extra-articular sites of bone marrow edema. No marrow replacing bone lesion. Articular cartilage and labrum Articular cartilage: Moderate osteoarthritis of the left hip with diffuse cartilage thinning and surface irregularity. No subchondral marrow signal changes. Labrum:  Superior labral degeneration.  No paralabral cyst. Joint or bursal effusion Joint effusion:  None. Bursae: Small amount of peritrochanteric bursal fluid, right greater than left. Muscles and tendons Muscles and tendons: Tendinosis with partial-thickness insertional tears involving the bilateral gluteus medius and gluteus minimus tendons. Mild tendinosis of the bilateral hamstring tendon origins. The iliopsoas, rectus femoris, and adductor tendons appear intact without tear or significant tendinosis. Mild generalized intramuscular edema. No intramuscular fluid collection. Preserved muscle bulk atrophy or fatty infiltration. Other findings Miscellaneous: Mild diffuse subcutaneous edema. No organized  fluid collection or hematoma. No inguinal lymphadenopathy. 3.8 cm right adnexal cyst with internal septations. IMPRESSION: 1. No acute osseous abnormality of the left hip or pelvis. 2. Moderate osteoarthritis of the left hip. 3. Tendinosis with partial-thickness insertional tears involving the bilateral gluteus medius and gluteus minimus tendons. 4. Mild tendinosis of the bilateral hamstring tendon origins. 5. Mild peritrochanteric bursal fluid, right greater than left. 6. Mildly complex 3.8 cm right adnexal cyst. Further  assessment with pelvic ultrasound is recommended. Electronically Signed   By: Davina Poke D.O.   On: 04/15/2022 09:46   DG HIP UNILAT WITH PELVIS 2-3 VIEWS LEFT  Result Date: 04/14/2022 CLINICAL DATA:  Left hip pain. EXAM: DG HIP (WITH OR WITHOUT PELVIS) 2-3V LEFT COMPARISON:  CT abdomen and pelvis 04/08/2022 FINDINGS: There is diffuse decreased bone mineralization. Mild-to-moderate bilateral femoroacetabular joint space narrowing and superolateral acetabular degenerative osteophytes. The bilateral sacroiliac and pubic symphysis joint spaces are maintained. No acute fracture is seen. No dislocation. Partial visualization of lumbar spine transpedicular rod and screw fusion hardware. Mild vascular calcifications. IMPRESSION: Mild-to-moderate bilateral femoroacetabular osteoarthritis. Electronically Signed   By: Yvonne Kendall M.D.   On: 04/14/2022 13:03   CARDIAC CATHETERIZATION  Result Date: 04/10/2022   Mid LAD lesion is 20% stenosed.   Mid Cx lesion is 30% stenosed.   Mid Cx to Dist Cx lesion is 20% stenosed.   Prox RCA-1 lesion is 30% stenosed.   Prox RCA-2 lesion is 20% stenosed.   Mid RCA lesion is 20% stenosed. Mild nonobstructive CAD with a percent mid LAD stenosis; 30% AV groove circumflex stenosis; and 20 to 30% RCA stenosis in a dominant vessel. There is moderate mitral annular calcification. Elevated LVEDP at 24 mm. RECOMMENDATION: Aspirin 81 mg.  Medical therapy for CAD.    DG Chest Port 1 View  Result Date: 04/10/2022 CLINICAL DATA:  81 year old female with history of respiratory distress. EXAM: PORTABLE CHEST 1 VIEW COMPARISON:  Chest x-ray 03/08/2022. FINDINGS: There is cephalization of the pulmonary vasculature and slight indistinctness of the interstitial markings suggestive of mild pulmonary edema. Trace bilateral pleural effusions. No pneumothorax. Heart size appears normal. Upper mediastinal contours are within normal limits. Atherosclerotic calcifications are noted in the thoracic aorta. IMPRESSION: 1. The appearance of the chest is suggestive of pulmonary edema, however, heart size is normal. Findings could reflect noncardiogenic edema, severe diastolic heart failure, or alternative etiology such as atypical infection. Further clinical evaluation is recommended. 2. Aortic atherosclerosis. Electronically Signed   By: Vinnie Langton M.D.   On: 04/10/2022 06:21   ECHOCARDIOGRAM LIMITED  Result Date: 04/08/2022    ECHOCARDIOGRAM LIMITED REPORT   Patient Name:   ANAYI BRICCO Date of Exam: 04/08/2022 Medical Rec #:  [id number]     Height:       62.0 in Accession #:    [id number]    Weight:       175.0 lb Date of Birth:  [birth year]     BSA:          1.806 m Patient Age:    81 years      BP:           115/52 mmHg Patient Gender: F             HR:           105 bpm. Exam Location:  Inpatient Procedure: Limited Echo, Cardiac Doppler and Color Doppler                          STAT ECHO Reported to: Dr Claudina Lick on 04/08/2022 3:54:00 PM. Indications:    Pulmonary embolus  History:        Patient has no prior history of Echocardiogram examinations.                 Arrythmias:LBBB; Risk Factors:Hypertension and Former Smoker.  Sepsis. AKI. Elevated troponin.  Sonographer:    Clayton Lefort RDCS (AE) Referring Phys: GRACE E BOWSER IMPRESSIONS  1. Left ventricular ejection fraction, by estimation, is 45 to 50%. The left ventricle has mildly decreased  function. The left ventricle has no regional wall motion abnormalities. There is moderate left ventricular hypertrophy. Left ventricular diastolic parameters are consistent with Grade II diastolic dysfunction (pseudonormalization).  2. Right ventricular systolic function is normal. The right ventricular size is normal. There is mildly elevated pulmonary artery systolic pressure.  3. The mitral valve is normal in structure. Mild mitral valve regurgitation. No evidence of mitral stenosis.  4. The tricuspid valve is abnormal. Tricuspid valve regurgitation is mild to moderate.  5. The aortic valve is tricuspid. Aortic valve regurgitation is not visualized. No aortic stenosis is present.  6. The inferior vena cava is dilated in size with >50% respiratory variability, suggesting right atrial pressure of 8 mmHg. Comparison(s): No prior Echocardiogram. FINDINGS  Left Ventricle: Left ventricular ejection fraction, by estimation, is 45 to 50%. The left ventricle has mildly decreased function. The left ventricle has no regional wall motion abnormalities. The left ventricular internal cavity size was normal in size. There is moderate left ventricular hypertrophy. Abnormal (paradoxical) septal motion, consistent with left bundle branch block. Left ventricular diastolic parameters are consistent with Grade II diastolic dysfunction (pseudonormalization). Right Ventricle: The right ventricular size is normal. No increase in right ventricular wall thickness. Right ventricular systolic function is normal. There is mildly elevated pulmonary artery systolic pressure. The tricuspid regurgitant velocity is 3.01  m/s, and with an assumed right atrial pressure of 8 mmHg, the estimated right ventricular systolic pressure is 34.1 mmHg. Left Atrium: Left atrial size was normal in size. Right Atrium: Right atrial size was normal in size. Pericardium: There is no evidence of pericardial effusion. Mitral Valve: The mitral valve is normal in  structure. There is moderate thickening of the mitral valve leaflet(s). Normal mobility of the mitral valve leaflets. Mild mitral valve regurgitation. No evidence of mitral valve stenosis. MV peak gradient, 6.2  mmHg. The mean mitral valve gradient is 4.0 mmHg. Tricuspid Valve: The tricuspid valve is abnormal. Tricuspid valve regurgitation is mild to moderate. No evidence of tricuspid stenosis. Aortic Valve: The aortic valve is tricuspid. Aortic valve regurgitation is not visualized. No aortic stenosis is present. Aortic valve mean gradient measures 4.0 mmHg. Aortic valve peak gradient measures 8.1 mmHg. Aortic valve area, by VTI measures 1.61 cm. Pulmonic Valve: The pulmonic valve was not well visualized. Pulmonic valve regurgitation is not visualized. No evidence of pulmonic stenosis. Aorta: The aortic root and ascending aorta are structurally normal, with no evidence of dilitation. Venous: The inferior vena cava is dilated in size with greater than 50% respiratory variability, suggesting right atrial pressure of 8 mmHg. IAS/Shunts: No atrial level shunt detected by color flow Doppler. Additional Comments: Spectral Doppler performed. Color Doppler performed.  LEFT VENTRICLE PLAX 2D LVIDd:         4.20 cm   Diastology LVIDs:         3.10 cm   LV e' medial:    5.77 cm/s LV PW:         1.20 cm   LV E/e' medial:  21.7 LV IVS:        1.70 cm   LV e' lateral:   8.16 cm/s LVOT diam:     1.80 cm   LV E/e' lateral: 15.3 LV SV:  34 LV SV Index:   19 LVOT Area:     2.54 cm  IVC IVC diam: 2.20 cm LEFT ATRIUM         Index LA diam:    3.00 cm 1.66 cm/m  AORTIC VALVE AV Area (Vmax):    1.61 cm AV Area (Vmean):   1.71 cm AV Area (VTI):     1.61 cm AV Vmax:           142.00 cm/s AV Vmean:          90.500 cm/s AV VTI:            0.212 m AV Peak Grad:      8.1 mmHg AV Mean Grad:      4.0 mmHg LVOT Vmax:         89.60 cm/s LVOT Vmean:        60.800 cm/s LVOT VTI:          0.134 m LVOT/AV VTI ratio: 0.63  AORTA Ao Root  diam: 2.90 cm Ao Asc diam:  2.80 cm MITRAL VALVE                TRICUSPID VALVE MV Area (PHT): 4.06 cm     TR Peak grad:   36.2 mmHg MV Area VTI:   1.42 cm     TR Vmax:        301.00 cm/s MV Peak grad:  6.2 mmHg MV Mean grad:  4.0 mmHg     SHUNTS MV Vmax:       1.24 m/s     Systemic VTI:  0.13 m MV Vmean:      92.5 cm/s    Systemic Diam: 1.80 cm MV Decel Time: 187 msec MV E velocity: 125.00 cm/s MV A velocity: 116.00 cm/s MV E/A ratio:  1.08 Vishnu Priya Mallipeddi Electronically signed by Lorelee Cover Mallipeddi Signature Date/Time: 04/08/2022/5:21:33 PM    Final    VAS [place] LOWER EXTREMITY VENOUS (DVT)  Result Date: 04/08/2022  Lower Venous DVT Study Patient Name:  MANA HABERL  Date of Exam:   04/08/2022 Medical Rec #: [id number]      Accession #:    [id number] Date of Birth: 15-Oct-1940      Patient Gender: F Patient Age:   6 years Exam Location:  [facility] Procedure:      VAS [place] LOWER EXTREMITY VENOUS (DVT) Referring Phys: Fuller Plan --------------------------------------------------------------------------------  Indications: Elevated D-Dimer.  Risk Factors: History of bilateral vein stripping. Comparison Study: Prior negative right LEV done 10/14/20 Performing Technologist: Sharion Dove RVS  Examination Guidelines: A complete evaluation includes B-mode imaging, spectral Doppler, color Doppler, and power Doppler as needed of all accessible portions of each vessel. Bilateral testing is considered an integral part of a complete examination. Limited examinations for reoccurring indications may be performed as noted. The reflux portion of the exam is performed with the patient in reverse Trendelenburg.  +---------+---------------+---------+-----------+----------+--------------+ RIGHT    CompressibilityPhasicitySpontaneityPropertiesThrombus Aging +---------+---------------+---------+-----------+----------+--------------+ CFV      Full           Yes      Yes                                  +---------+---------------+---------+-----------+----------+--------------+ SFJ      Full                                                        +---------+---------------+---------+-----------+----------+--------------+  FV Prox  Full                                                        +---------+---------------+---------+-----------+----------+--------------+ FV Mid   Full                                                        +---------+---------------+---------+-----------+----------+--------------+ FV DistalFull                                                        +---------+---------------+---------+-----------+----------+--------------+ PFV      Full                                                        +---------+---------------+---------+-----------+----------+--------------+ POP      Full           Yes      Yes                                 +---------+---------------+---------+-----------+----------+--------------+ PTV      Full                                                        +---------+---------------+---------+-----------+----------+--------------+ PERO     Full                                                        +---------+---------------+---------+-----------+----------+--------------+   +---------+---------------+---------+-----------+----------+--------------+ LEFT     CompressibilityPhasicitySpontaneityPropertiesThrombus Aging +---------+---------------+---------+-----------+----------+--------------+ CFV      Full           Yes      Yes                                 +---------+---------------+---------+-----------+----------+--------------+ SFJ      Full                                                        +---------+---------------+---------+-----------+----------+--------------+ FV Prox  Full                                                         +---------+---------------+---------+-----------+----------+--------------+  FV Mid   Full                                                        +---------+---------------+---------+-----------+----------+--------------+ FV DistalFull                                                        +---------+---------------+---------+-----------+----------+--------------+ PFV      Full                                                        +---------+---------------+---------+-----------+----------+--------------+ POP      Full           Yes      Yes                                 +---------+---------------+---------+-----------+----------+--------------+ PTV      Full                                                        +---------+---------------+---------+-----------+----------+--------------+ PERO     Full                                                        +---------+---------------+---------+-----------+----------+--------------+     Summary: BILATERAL: - No evidence of deep vein thrombosis seen in the lower extremities, bilaterally. -No evidence of popliteal cyst, bilaterally. RIGHT: interstitial edema noted throughout  LEFT: Interstitial edema noted throughout.  *See table(s) above for measurements and observations. Electronically signed by Monica Martinez MD on 04/08/2022 at 1:14:36 PM.    Final    CT ABDOMEN PELVIS WO CONTRAST  Result Date: 04/08/2022 CLINICAL DATA:  Urinary incontinence and frequency. EXAM: CT ABDOMEN AND PELVIS WITHOUT CONTRAST TECHNIQUE: Multidetector CT imaging of the abdomen and pelvis was performed following the standard protocol without IV contrast. RADIATION DOSE REDUCTION: This exam was performed according to the departmental dose-optimization program which includes automated exposure control, adjustment of the mA and/or kV according to patient size and/or use of iterative reconstruction technique. COMPARISON:  01/17/2013 FINDINGS:  Lower chest: Very small bilateral pleural effusions and bibasilar atelectasis. The heart is normal in size. No pericardial effusion. Aortic and coronary artery calcifications are noted. Hepatobiliary: No hepatic lesions or intrahepatic biliary dilatation. Mild gallbladder distension but no CT findings to suggest acute cholecystitis. No common bile duct dilatation. Pancreas: Moderate atrophy but no mass or inflammation. Spleen: Normal size. No focal lesions. Adrenals/Urinary Tract: Adrenal glands and kidneys are grossly normal. No renal lesions or hydronephrosis. No obstructing ureteral calculi or bladder calculi. There are moderate interstitial changes around the left kidney.  Could not exclude the possibility of pyelonephritis. Recommend correlation with urinalysis. Stomach/Bowel: The stomach, duodenum, small bowel and colon are grossly normal. No acute inflammatory process, mass lesions or obstructive findings. Vascular/Lymphatic: Age related atherosclerotic calcification involving the aorta and branch vessels but no aneurysm. Small scattered mesenteric and retroperitoneal lymph nodes but no mass or overt adenopathy. Reproductive: The uterus is surgically absent. Both ovaries are still present. There is a simple appearing 3.8 cm cyst associated with the right ovary. Recommend follow-up US in 6-12 months. Note: This recommendation does not apply to premenarchal patients and to those with increased risk (genetic, family history, elevated tumor markers or other high-risk factors) of ovarian cancer. Reference: JACR 2020 Feb; 17(2):248-254. The left ovary is normal. Other: No pelvic mass or adenopathy. No free pelvic fluid collections. No inguinal mass or adenopathy. No abdominal wall hernia or subcutaneous lesions. Musculoskeletal: Extensive postoperative changes involving the lumbar spine. Advanced degenerative disc disease at L5-S1. No acute bony findings or worrisome bone lesions. IMPRESSION: 1. Moderate  interstitial changes and fluid around the left kidney could be due to pyelonephritis. Recommend correlation with urinalysis. 2. No obstructing ureteral calculi or bladder calculi. 3. 3.8 cm simple appearing right ovarian cyst. Recommend follow-up US in 6-12 months. 4. Very small bilateral pleural effusions and bibasilar atelectasis. 5. Age related atherosclerotic calcification involving the aorta and branch vessels. 6. Aortic atherosclerosis. Aortic Atherosclerosis (ICD10-I70.0). Electronically Signed   By: Marijo Sanes M.D.   On: 04/08/2022 11:56   CT Head Wo Contrast  Result Date: 04/08/2022 CLINICAL DATA:  Mental status change EXAM: CT HEAD WITHOUT CONTRAST TECHNIQUE: Contiguous axial images were obtained from the base of the skull through the vertex without intravenous contrast. RADIATION DOSE REDUCTION: This exam was performed according to the departmental dose-optimization program which includes automated exposure control, adjustment of the mA and/or kV according to patient size and/or use of iterative reconstruction technique. COMPARISON:  10/16/2011 CT head, correlation is also made with 04/04/2018 MRI head FINDINGS: Brain: No evidence of acute infarction, hemorrhage, mass, mass effect, or midline shift. No hydrocephalus or extra-axial fluid collection. Periventricular white matter changes, likely the sequela of chronic small vessel ischemic disease. Vascular: No hyperdense vessel. Atherosclerotic calcifications in the intracranial carotid and vertebral arteries. Skull: Normal. Negative for fracture or focal lesion. Sinuses/Orbits: No acute finding. Other: The mastoid air cells are well aerated. IMPRESSION: No acute intracranial process. Electronically Signed   By: Merilyn Baba M.D.   On: 04/08/2022 03:53   DG Chest Portable 1 View  Result Date: 04/08/2022 CLINICAL DATA:  Clinical concern for pneumonia. EXAM: PORTABLE CHEST 1 VIEW COMPARISON:  09/01/2014. FINDINGS: The heart size and mediastinal  contours are within normal limits. There is atherosclerotic calcification of the aorta. No consolidation, effusion, or pneumothorax. No acute osseous abnormality. IMPRESSION: No active disease. Electronically Signed   By: Brett Fairy M.D.   On: 04/08/2022 02:32    Microbiology: Results for orders placed or performed during the hospital encounter of 04/08/22  Urine Culture     Status: Abnormal   Collection Time: 04/08/22  5:28 AM   Specimen: Urine, Catheterized  Result Value Ref Range Status   Specimen Description URINE, CATHETERIZED  Final   Special Requests   Final    NONE Performed at [facility], [address], [place], [place] [address]    Culture >=100,000 COLONIES/mL ESCHERICHIA COLI (A)  Final   Report Status 04/10/2022 FINAL  Final   Organism ID, Bacteria ESCHERICHIA COLI (A)  Final      Susceptibility   Escherichia coli - MIC*    AMPICILLIN <=2 SENSITIVE Sensitive     CEFAZOLIN <=4 SENSITIVE Sensitive     CEFEPIME <=0.12 SENSITIVE Sensitive     CEFTRIAXONE <=0.25 SENSITIVE Sensitive     CIPROFLOXACIN <=0.25 SENSITIVE Sensitive     GENTAMICIN <=1 SENSITIVE Sensitive     IMIPENEM <=0.25 SENSITIVE Sensitive     NITROFURANTOIN <=16 SENSITIVE Sensitive     TRIMETH/SULFA <=20 SENSITIVE Sensitive     AMPICILLIN/SULBACTAM <=2 SENSITIVE Sensitive     PIP/TAZO <=4 SENSITIVE Sensitive     * >=100,000 COLONIES/mL ESCHERICHIA COLI  Blood culture (routine x 2)     Status: Abnormal   Collection Time: 04/08/22  6:40 AM   Specimen: BLOOD  Result Value Ref Range Status   Specimen Description BLOOD RIGHT ANTECUBITAL  Final   Special Requests   Final    BOTTLES DRAWN AEROBIC AND ANAEROBIC Blood Culture adequate volume   Culture  Setup Time   Final    IN BOTH AEROBIC AND ANAEROBIC BOTTLES GRAM NEGATIVE RODS Organism ID to follow CRITICAL RESULT CALLED TO, READ BACK BY AND VERIFIED WITH:  C/ PHARMD L. CHEN 04/08/22 1920 A. LAFRANCE  Performed at [facility], [place] [address] [place], [place] [address]    Culture ESCHERICHIA COLI (A)  Final   Report Status 04/10/2022 FINAL  Final   Organism ID, Bacteria ESCHERICHIA COLI  Final      Susceptibility   Escherichia coli - MIC*    AMPICILLIN <=2 SENSITIVE Sensitive     CEFAZOLIN <=4 SENSITIVE Sensitive     CEFEPIME <=0.12 SENSITIVE Sensitive     CEFTAZIDIME <=1 SENSITIVE Sensitive     CEFTRIAXONE <=0.25 SENSITIVE Sensitive     CIPROFLOXACIN <=0.25 SENSITIVE Sensitive     GENTAMICIN <=1 SENSITIVE Sensitive     IMIPENEM <=0.25 SENSITIVE Sensitive     TRIMETH/SULFA <=20 SENSITIVE Sensitive     AMPICILLIN/SULBACTAM <=2 SENSITIVE Sensitive     PIP/TAZO <=4 SENSITIVE Sensitive     * ESCHERICHIA COLI  Blood Culture ID Panel (Reflexed)     Status: Abnormal   Collection Time: 04/08/22  6:40 AM  Result Value Ref Range Status   Enterococcus faecalis NOT DETECTED NOT DETECTED Final   Enterococcus Faecium NOT DETECTED NOT DETECTED Final   Listeria monocytogenes NOT DETECTED NOT DETECTED Final   Staphylococcus species NOT DETECTED NOT DETECTED Final   Staphylococcus aureus (BCID) NOT DETECTED NOT DETECTED Final   Staphylococcus epidermidis NOT DETECTED NOT DETECTED Final   Staphylococcus lugdunensis NOT DETECTED NOT DETECTED Final   Streptococcus species NOT DETECTED NOT DETECTED Final   Streptococcus agalactiae NOT DETECTED NOT DETECTED Final   Streptococcus pneumoniae NOT DETECTED NOT DETECTED Final   Streptococcus pyogenes NOT DETECTED NOT DETECTED Final   A.calcoaceticus-baumannii NOT DETECTED NOT DETECTED Final   Bacteroides fragilis NOT DETECTED NOT DETECTED Final   Enterobacterales DETECTED (A) NOT DETECTED Final    Comment: Enterobacterales represent a large order of gram negative bacteria, not a single organism. CRITICAL RESULT CALLED TO, READ BACK BY AND VERIFIED WITH:  C/ PHARMD L. CHEN 04/08/22 1920 A. LAFRANCE     Enterobacter cloacae complex NOT DETECTED NOT DETECTED Final   Escherichia coli  DETECTED (A) NOT DETECTED Final    Comment: CRITICAL RESULT CALLED TO, READ BACK BY AND VERIFIED WITH:  C/ PHARMD L. CHEN 04/08/22 1920 A. LAFRANCE     Klebsiella aerogenes NOT DETECTED NOT DETECTED  Final   Klebsiella oxytoca NOT DETECTED NOT DETECTED Final   Klebsiella pneumoniae NOT DETECTED NOT DETECTED Final   Proteus species NOT DETECTED NOT DETECTED Final   Salmonella species NOT DETECTED NOT DETECTED Final   Serratia marcescens NOT DETECTED NOT DETECTED Final   Haemophilus influenzae NOT DETECTED NOT DETECTED Final   Neisseria meningitidis NOT DETECTED NOT DETECTED Final   Pseudomonas aeruginosa NOT DETECTED NOT DETECTED Final   Stenotrophomonas maltophilia NOT DETECTED NOT DETECTED Final   Candida albicans NOT DETECTED NOT DETECTED Final   Candida auris NOT DETECTED NOT DETECTED Final   Candida glabrata NOT DETECTED NOT DETECTED Final   Candida krusei NOT DETECTED NOT DETECTED Final   Candida parapsilosis NOT DETECTED NOT DETECTED Final   Candida tropicalis NOT DETECTED NOT DETECTED Final   Cryptococcus neoformans/gattii NOT DETECTED NOT DETECTED Final   CTX-M ESBL NOT DETECTED NOT DETECTED Final   Carbapenem resistance IMP NOT DETECTED NOT DETECTED Final   Carbapenem resistance KPC NOT DETECTED NOT DETECTED Final   Carbapenem resistance NDM NOT DETECTED NOT DETECTED Final   Carbapenem resist OXA 48 LIKE NOT DETECTED NOT DETECTED Final   Carbapenem resistance VIM NOT DETECTED NOT DETECTED Final    Comment: Performed at [facility], [address], [place], [place] [address]  MRSA Next Gen by PCR, Nasal     Status: None   Collection Time: 04/08/22  4:35 PM   Specimen: Nasal Mucosa; Nasal Swab  Result Value Ref Range Status   MRSA by PCR Next Gen NOT DETECTED NOT DETECTED Final    Comment: (NOTE) The GeneXpert MRSA Assay (FDA approved for NASAL specimens only), is one component of a comprehensive MRSA colonization surveillance program. It is not intended to diagnose  MRSA infection nor to guide or monitor treatment for MRSA infections. Test performance is not FDA approved in patients less than 59 years old. Performed at [facility], [place] [address], [place], [place] [address]   Blood culture (routine x 2)     Status: None   Collection Time: 04/08/22  5:07 PM   Specimen: BLOOD LEFT HAND  Result Value Ref Range Status   Specimen Description BLOOD LEFT HAND  Final   Special Requests   Final    BOTTLES DRAWN AEROBIC AND ANAEROBIC Blood Culture results may not be optimal due to an inadequate volume of blood received in culture bottles   Culture   Final    NO GROWTH 5 DAYS Performed at [facility], [place] [address] [place],  [address]    Report Status 04/13/2022 FINAL  Final   Labs: CBC: Recent Labs  Lab 04/12/22 0138 04/13/22 0112 04/14/22 1026 04/15/22 0102 04/16/22 0102  WBC 19.6* 21.5* 25.7* 21.4* 18.2*  NEUTROABS 15.1* 17.6* 21.8* 18.0* 13.7*  HGB 11.4* 12.5 13.9 12.3 12.5  HCT 32.4* 36.0 39.0 35.6* 36.3  MCV 89.8 90.2 89.2 91.8 90.3  PLT 114* 142* 193 180 196   Basic Metabolic Panel: Recent Labs  Lab 04/12/22 0138 04/13/22 0112 04/14/22 1026 04/15/22 0102 04/16/22 0102  NA 137 137 134* 136 133*  K 3.9 4.1 4.2 4.5 4.1  CL 107 103 101 100 98  CO2 _0 GLUCOSE 126* 128* 109* 123* 153*  BUN 34* 30* 26* 22 22  CREATININE 0.80 0.78 0.74 0.78 0.74  CALCIUM 8.3* 8.6* 8.2* 8.1* 8.1*  MG  --  1.8 2.1 2.1 2.1  PHOS  --  3.2 3.4 3.7  3.5   Liver Function Tests: Recent Labs  Lab 04/13/22 0112 04/14/22 1026 04/15/22 0102 04/16/22 0102  AST 95* 50* 35 21  ALT 80* 39 27 19  ALKPHOS 80 70 58 56  BILITOT 0.8 1.0 0.8 0.7  PROT 5.2* 5.8* 5.1* 5.3*  ALBUMIN 2.5* 2.9* 2.5* 2.6*   CBG: No results for input(s): "GLUCAP" in the last 168 hours.  Discharge time spent: greater than 30 minutes.  Signed: Raiford Noble, DO [facility] 04/16/2022

## 2022-04-16 NOTE — Discharge Summary (Incomplete)
Physician Discharge Summary   Patient: Amber [name] MRN: [id number] DOB: [birth year]  Admit date:     04/08/2022  Discharge date: {dischdate:26783}  Discharge Physician: Kerney Elbe   PCP: Leamon Arnt, MD   Recommendations at discharge:  {Tip this will not be part of the note when signed- Example include specific recommendations for outpatient follow-up, pending tests to follow-up on. (Optional):26781}  ***  Discharge Diagnoses: Principal Problem:   Urinary tract infection with hematuria Active Problems:   Severe sepsis (HCC)   Transient hypotension   Acute metabolic encephalopathy   Cellulitis, face   Elevated troponin   AKI (acute kidney injury) ([place])   Complete left bundle branch block   Normocytic anemia   Thrombocytopenia (HCC)   Sepsis (HCC)   NSTEMI (non-ST elevated myocardial infarction) ([place])   Chest pain of uncertain etiology  Resolved Problems:   * No resolved hospital problems. [facility] Course: No notes on file  Assessment and Plan: No notes have been filed under this hospital service. Service: Hospitalist     {Tip this will not be part of the note when signed Body mass index is 33.06 kg/m. , ,  (Optional):26781}  {(NOTE) Pain control PDMP Statment (Optional):26782} Consultants: *** Procedures performed: ***  Disposition: {Plan; Disposition:26390} Diet recommendation:  {Diet_Plan:26776} DISCHARGE MEDICATION: Allergies as of 04/16/2022       Reactions   Codeine Nausea And Vomiting   Darvocet [propoxyphene N-acetaminophen] Nausea And Vomiting   Vicodin [hydrocodone-acetaminophen] Other (See Comments)   Unknown reactions   Nickel Rash   Sulfa Antibiotics Rash   Like a sunburn     Med Rec must be completed prior to using this [facility]***       Discharge Exam: Filed Weights   04/14/22 0500 04/15/22 0433 04/16/22 0500  Weight: 81.7 kg 81.9 kg 82 kg   ***  Condition at discharge: {DC Condition:26389}  The results of  significant diagnostics from this hospitalization (including imaging, microbiology, ancillary and laboratory) are listed below for reference.   Imaging Studies: US PELVIS (TRANSABDOMINAL ONLY)  Result Date: 04/15/2022 CLINICAL DATA:  Right adnexal cyst noted on prior CT dated 04/08/2022 EXAM: TRANSABDOMINAL ULTRASOUND OF PELVIS TECHNIQUE: Transabdominal ultrasound examination of the pelvis was performed including evaluation of the uterus, ovaries, adnexal regions, and pelvic cul-de-sac. COMPARISON:  CT abdomen and pelvis dated 04/08/2022 FINDINGS: Uterus Surgically absent. Right ovary Right adnexal simple-appearing cyst measures 4.9 x 4.0 x 3.0 cm. The right ovary is not seen. Left ovary Left ovary is not seen. Other findings:  No abnormal free fluid. IMPRESSION: The ovaries are not seen. 4.9 cm simple-appearing right adnexal cyst, almost certainly benign. Consider follow-up ultrasound pelvis in 1 year to assess stability, as clinically indicated. Electronically Signed   By: Darrin Nipper M.D.   On: 04/15/2022 13:51   MR HIP LEFT W WO CONTRAST  Result Date: 04/15/2022 CLINICAL DATA:  Left hip pain after fall EXAM: MRI OF THE LEFT HIP WITHOUT AND WITH CONTRAST TECHNIQUE: Multiplanar, multisequence MR imaging was performed both before and after administration of intravenous contrast. CONTRAST:  7.73mL GADAVIST GADOBUTROL 1 MMOL/ML IV SOLN COMPARISON:  X-ray 04/14/2022, CT 04/08/2022 FINDINGS: Bones: No acute fracture. No dislocation. No femoral head avascular necrosis. Bony pelvis intact without diastasis. SI joints and pubic symphysis within normal limits. Partially imaged lumbar fusion hardware with associated susceptibility artifact. There is degenerative disc disease at the L5-S1 level, not well assessed on the included imaging planes. No extra-articular sites of bone marrow  edema. No marrow replacing bone lesion. Articular cartilage and labrum Articular cartilage: Moderate osteoarthritis of the left hip  with diffuse cartilage thinning and surface irregularity. No subchondral marrow signal changes. Labrum:  Superior labral degeneration.  No paralabral cyst. Joint or bursal effusion Joint effusion:  None. Bursae: Small amount of peritrochanteric bursal fluid, right greater than left. Muscles and tendons Muscles and tendons: Tendinosis with partial-thickness insertional tears involving the bilateral gluteus medius and gluteus minimus tendons. Mild tendinosis of the bilateral hamstring tendon origins. The iliopsoas, rectus femoris, and adductor tendons appear intact without tear or significant tendinosis. Mild generalized intramuscular edema. No intramuscular fluid collection. Preserved muscle bulk atrophy or fatty infiltration. Other findings Miscellaneous: Mild diffuse subcutaneous edema. No organized fluid collection or hematoma. No inguinal lymphadenopathy. 3.8 cm right adnexal cyst with internal septations. IMPRESSION: 1. No acute osseous abnormality of the left hip or pelvis. 2. Moderate osteoarthritis of the left hip. 3. Tendinosis with partial-thickness insertional tears involving the bilateral gluteus medius and gluteus minimus tendons. 4. Mild tendinosis of the bilateral hamstring tendon origins. 5. Mild peritrochanteric bursal fluid, right greater than left. 6. Mildly complex 3.8 cm right adnexal cyst. Further assessment with pelvic ultrasound is recommended. Electronically Signed   By: Duanne Guess D.O.   On: 04/15/2022 09:46   DG HIP UNILAT WITH PELVIS 2-3 VIEWS LEFT  Result Date: 04/14/2022 CLINICAL DATA:  Left hip pain. EXAM: DG HIP (WITH OR WITHOUT PELVIS) 2-3V LEFT COMPARISON:  CT abdomen and pelvis 04/08/2022 FINDINGS: There is diffuse decreased bone mineralization. Mild-to-moderate bilateral femoroacetabular joint space narrowing and superolateral acetabular degenerative osteophytes. The bilateral sacroiliac and pubic symphysis joint spaces are maintained. No acute fracture is seen. No  dislocation. Partial visualization of lumbar spine transpedicular rod and screw fusion hardware. Mild vascular calcifications. IMPRESSION: Mild-to-moderate bilateral femoroacetabular osteoarthritis. Electronically Signed   By: Neita Garnet M.D.   On: 04/14/2022 13:03   CARDIAC CATHETERIZATION  Result Date: 04/10/2022   Mid LAD lesion is 20% stenosed.   Mid Cx lesion is 30% stenosed.   Mid Cx to Dist Cx lesion is 20% stenosed.   Prox RCA-1 lesion is 30% stenosed.   Prox RCA-2 lesion is 20% stenosed.   Mid RCA lesion is 20% stenosed. Mild nonobstructive CAD with a percent mid LAD stenosis; 30% AV groove circumflex stenosis; and 20 to 30% RCA stenosis in a dominant vessel. There is moderate mitral annular calcification. Elevated LVEDP at 24 mm. RECOMMENDATION: Aspirin 81 mg.  Medical therapy for CAD.   DG Chest Port 1 View  Result Date: 04/10/2022 CLINICAL DATA:  81 year old female with history of respiratory distress. EXAM: PORTABLE CHEST 1 VIEW COMPARISON:  Chest x-ray 03/08/2022. FINDINGS: There is cephalization of the pulmonary vasculature and slight indistinctness of the interstitial markings suggestive of mild pulmonary edema. Trace bilateral pleural effusions. No pneumothorax. Heart size appears normal. Upper mediastinal contours are within normal limits. Atherosclerotic calcifications are noted in the thoracic aorta. IMPRESSION: 1. The appearance of the chest is suggestive of pulmonary edema, however, heart size is normal. Findings could reflect noncardiogenic edema, severe diastolic heart failure, or alternative etiology such as atypical infection. Further clinical evaluation is recommended. 2. Aortic atherosclerosis. Electronically Signed   By: Trudie Reed M.D.   On: 04/10/2022 06:21   ECHOCARDIOGRAM LIMITED  Result Date: 04/08/2022    ECHOCARDIOGRAM LIMITED REPORT   Patient Name:   Amber [name] Date of Exam: 04/08/2022 Medical Rec #:  [id number]     Height:  62.0 in Accession #:     [id number]    Weight:       175.0 lb Date of Birth:  16-Feb-1941     BSA:          1.806 m Patient Age:    70 years      BP:           115/52 mmHg Patient Gender: F             HR:           105 bpm. Exam Location:  Inpatient Procedure: Limited Echo, Cardiac Doppler and Color Doppler                          STAT ECHO Reported to: Dr Claudina Lick on 04/08/2022 3:54:00 PM. Indications:    Pulmonary embolus  History:        Patient has no prior history of Echocardiogram examinations.                 Arrythmias:LBBB; Risk Factors:Hypertension and Former Smoker.                 Sepsis. AKI. Elevated troponin.  Sonographer:    Clayton Lefort RDCS (AE) Referring Phys: GRACE E BOWSER IMPRESSIONS  1. Left ventricular ejection fraction, by estimation, is 45 to 50%. The left ventricle has mildly decreased function. The left ventricle has no regional wall motion abnormalities. There is moderate left ventricular hypertrophy. Left ventricular diastolic parameters are consistent with Grade II diastolic dysfunction (pseudonormalization).  2. Right ventricular systolic function is normal. The right ventricular size is normal. There is mildly elevated pulmonary artery systolic pressure.  3. The mitral valve is normal in structure. Mild mitral valve regurgitation. No evidence of mitral stenosis.  4. The tricuspid valve is abnormal. Tricuspid valve regurgitation is mild to moderate.  5. The aortic valve is tricuspid. Aortic valve regurgitation is not visualized. No aortic stenosis is present.  6. The inferior vena cava is dilated in size with >50% respiratory variability, suggesting right atrial pressure of 8 mmHg. Comparison(s): No prior Echocardiogram. FINDINGS  Left Ventricle: Left ventricular ejection fraction, by estimation, is 45 to 50%. The left ventricle has mildly decreased function. The left ventricle has no regional wall motion abnormalities. The left ventricular internal cavity size was normal in size. There is moderate  left ventricular hypertrophy. Abnormal (paradoxical) septal motion, consistent with left bundle branch block. Left ventricular diastolic parameters are consistent with Grade II diastolic dysfunction (pseudonormalization). Right Ventricle: The right ventricular size is normal. No increase in right ventricular wall thickness. Right ventricular systolic function is normal. There is mildly elevated pulmonary artery systolic pressure. The tricuspid regurgitant velocity is 3.01  m/s, and with an assumed right atrial pressure of 8 mmHg, the estimated right ventricular systolic pressure is [id number] mmHg. Left Atrium: Left atrial size was normal in size. Right Atrium: Right atrial size was normal in size. Pericardium: There is no evidence of pericardial effusion. Mitral Valve: The mitral valve is normal in structure. There is moderate thickening of the mitral valve leaflet(s). Normal mobility of the mitral valve leaflets. Mild mitral valve regurgitation. No evidence of mitral valve stenosis. MV peak gradient, 6.2  mmHg. The mean mitral valve gradient is 4.0 mmHg. Tricuspid Valve: The tricuspid valve is abnormal. Tricuspid valve regurgitation is mild to moderate. No evidence of tricuspid stenosis. Aortic Valve: The aortic valve is tricuspid. Aortic valve regurgitation is not visualized. No aortic  stenosis is present. Aortic valve mean gradient measures 4.0 mmHg. Aortic valve peak gradient measures 8.1 mmHg. Aortic valve area, by VTI measures 1.61 cm. Pulmonic Valve: The pulmonic valve was not well visualized. Pulmonic valve regurgitation is not visualized. No evidence of pulmonic stenosis. Aorta: The aortic root and ascending aorta are structurally normal, with no evidence of dilitation. Venous: The inferior vena cava is dilated in size with greater than 50% respiratory variability, suggesting right atrial pressure of 8 mmHg. IAS/Shunts: No atrial level shunt detected by color flow Doppler. Additional Comments: Spectral Doppler  performed. Color Doppler performed.  LEFT VENTRICLE PLAX 2D LVIDd:         4.20 cm   Diastology LVIDs:         3.10 cm   LV e' medial:    5.77 cm/s LV PW:         1.20 cm   LV E/e' medial:  21.7 LV IVS:        1.70 cm   LV e' lateral:   8.16 cm/s LVOT diam:     1.80 cm   LV E/e' lateral: 15.3 LV SV:         34 LV SV Index:   19 LVOT Area:     2.54 cm  IVC IVC diam: 2.20 cm LEFT ATRIUM         Index LA diam:    3.00 cm 1.66 cm/m  AORTIC VALVE AV Area (Vmax):    1.61 cm AV Area (Vmean):   1.71 cm AV Area (VTI):     1.61 cm AV Vmax:           142.00 cm/s AV Vmean:          90.500 cm/s AV VTI:            0.212 m AV Peak Grad:      8.1 mmHg AV Mean Grad:      4.0 mmHg LVOT Vmax:         89.60 cm/s LVOT Vmean:        60.800 cm/s LVOT VTI:          0.134 m LVOT/AV VTI ratio: 0.63  AORTA Ao Root diam: 2.90 cm Ao Asc diam:  2.80 cm MITRAL VALVE                TRICUSPID VALVE MV Area (PHT): 4.06 cm     TR Peak grad:   36.2 mmHg MV Area VTI:   1.42 cm     TR Vmax:        301.00 cm/s MV Peak grad:  6.2 mmHg MV Mean grad:  4.0 mmHg     SHUNTS MV Vmax:       1.24 m/s     Systemic VTI:  0.13 m MV Vmean:      92.5 cm/s    Systemic Diam: 1.80 cm MV Decel Time: 187 msec MV E velocity: 125.00 cm/s MV A velocity: 116.00 cm/s MV E/A ratio:  1.08 Vishnu Priya Mallipeddi Electronically signed by Lorelee Cover Mallipeddi Signature Date/Time: 04/08/2022/5:21:33 PM    Final    VAS [place] LOWER EXTREMITY VENOUS (DVT)  Result Date: 04/08/2022  Lower Venous DVT Study Patient Name:  Amber [name]  Date of Exam:   04/08/2022 Medical Rec #: [id number]      Accession #:    [id number] Date of Birth: February 01, 1941      Patient Gender: F Patient Age:   18 years Exam Location:  [facility] Procedure:      VAS [place] LOWER EXTREMITY VENOUS (DVT) Referring Phys: Fuller Plan --------------------------------------------------------------------------------  Indications: Elevated D-Dimer.  Risk Factors: History of bilateral vein stripping. Comparison  Study: Prior negative right LEV done 10/14/20 Performing Technologist: Sharion Dove RVS  Examination Guidelines: A complete evaluation includes B-mode imaging, spectral Doppler, color Doppler, and power Doppler as needed of all accessible portions of each vessel. Bilateral testing is considered an integral part of a complete examination. Limited examinations for reoccurring indications may be performed as noted. The reflux portion of the exam is performed with the patient in reverse Trendelenburg.  +---------+---------------+---------+-----------+----------+--------------+ RIGHT    CompressibilityPhasicitySpontaneityPropertiesThrombus Aging +---------+---------------+---------+-----------+----------+--------------+ CFV      Full           Yes      Yes                                 +---------+---------------+---------+-----------+----------+--------------+ SFJ      Full                                                        +---------+---------------+---------+-----------+----------+--------------+ FV Prox  Full                                                        +---------+---------------+---------+-----------+----------+--------------+ FV Mid   Full                                                        +---------+---------------+---------+-----------+----------+--------------+ FV DistalFull                                                        +---------+---------------+---------+-----------+----------+--------------+ PFV      Full                                                        +---------+---------------+---------+-----------+----------+--------------+ POP      Full           Yes      Yes                                 +---------+---------------+---------+-----------+----------+--------------+ PTV      Full                                                        +---------+---------------+---------+-----------+----------+--------------+  PERO  Full                                                        +---------+---------------+---------+-----------+----------+--------------+   +---------+---------------+---------+-----------+----------+--------------+ LEFT     CompressibilityPhasicitySpontaneityPropertiesThrombus Aging +---------+---------------+---------+-----------+----------+--------------+ CFV      Full           Yes      Yes                                 +---------+---------------+---------+-----------+----------+--------------+ SFJ      Full                                                        +---------+---------------+---------+-----------+----------+--------------+ FV Prox  Full                                                        +---------+---------------+---------+-----------+----------+--------------+ FV Mid   Full                                                        +---------+---------------+---------+-----------+----------+--------------+ FV DistalFull                                                        +---------+---------------+---------+-----------+----------+--------------+ PFV      Full                                                        +---------+---------------+---------+-----------+----------+--------------+ POP      Full           Yes      Yes                                 +---------+---------------+---------+-----------+----------+--------------+ PTV      Full                                                        +---------+---------------+---------+-----------+----------+--------------+ PERO     Full                                                        +---------+---------------+---------+-----------+----------+--------------+  Summary: BILATERAL: - No evidence of deep vein thrombosis seen in the lower extremities, bilaterally. -No evidence of popliteal cyst, bilaterally. RIGHT: interstitial edema noted throughout   LEFT: Interstitial edema noted throughout.  *See table(s) above for measurements and observations. Electronically signed by Monica Martinez MD on 04/08/2022 at 1:14:36 PM.    Final    CT ABDOMEN PELVIS WO CONTRAST  Result Date: 04/08/2022 CLINICAL DATA:  Urinary incontinence and frequency. EXAM: CT ABDOMEN AND PELVIS WITHOUT CONTRAST TECHNIQUE: Multidetector CT imaging of the abdomen and pelvis was performed following the standard protocol without IV contrast. RADIATION DOSE REDUCTION: This exam was performed according to the departmental dose-optimization program which includes automated exposure control, adjustment of the mA and/or kV according to patient size and/or use of iterative reconstruction technique. COMPARISON:  01/17/2013 FINDINGS: Lower chest: Very small bilateral pleural effusions and bibasilar atelectasis. The heart is normal in size. No pericardial effusion. Aortic and coronary artery calcifications are noted. Hepatobiliary: No hepatic lesions or intrahepatic biliary dilatation. Mild gallbladder distension but no CT findings to suggest acute cholecystitis. No common bile duct dilatation. Pancreas: Moderate atrophy but no mass or inflammation. Spleen: Normal size. No focal lesions. Adrenals/Urinary Tract: Adrenal glands and kidneys are grossly normal. No renal lesions or hydronephrosis. No obstructing ureteral calculi or bladder calculi. There are moderate interstitial changes around the left kidney. Could not exclude the possibility of pyelonephritis. Recommend correlation with urinalysis. Stomach/Bowel: The stomach, duodenum, small bowel and colon are grossly normal. No acute inflammatory process, mass lesions or obstructive findings. Vascular/Lymphatic: Age related atherosclerotic calcification involving the aorta and branch vessels but no aneurysm. Small scattered mesenteric and retroperitoneal lymph nodes but no mass or overt adenopathy. Reproductive: The uterus is surgically absent. Both  ovaries are still present. There is a simple appearing 3.8 cm cyst associated with the right ovary. Recommend follow-up US in 6-12 months. Note: This recommendation does not apply to premenarchal patients and to those with increased risk (genetic, family history, elevated tumor markers or other high-risk factors) of ovarian cancer. Reference: JACR 2020 Feb; 17(2):248-254. The left ovary is normal. Other: No pelvic mass or adenopathy. No free pelvic fluid collections. No inguinal mass or adenopathy. No abdominal wall hernia or subcutaneous lesions. Musculoskeletal: Extensive postoperative changes involving the lumbar spine. Advanced degenerative disc disease at L5-S1. No acute bony findings or worrisome bone lesions. IMPRESSION: 1. Moderate interstitial changes and fluid around the left kidney could be due to pyelonephritis. Recommend correlation with urinalysis. 2. No obstructing ureteral calculi or bladder calculi. 3. 3.8 cm simple appearing right ovarian cyst. Recommend follow-up US in 6-12 months. 4. Very small bilateral pleural effusions and bibasilar atelectasis. 5. Age related atherosclerotic calcification involving the aorta and branch vessels. 6. Aortic atherosclerosis. Aortic Atherosclerosis (ICD10-I70.0). Electronically Signed   By: Marijo Sanes M.D.   On: 04/08/2022 11:56   CT Head Wo Contrast  Result Date: 04/08/2022 CLINICAL DATA:  Mental status change EXAM: CT HEAD WITHOUT CONTRAST TECHNIQUE: Contiguous axial images were obtained from the base of the skull through the vertex without intravenous contrast. RADIATION DOSE REDUCTION: This exam was performed according to the departmental dose-optimization program which includes automated exposure control, adjustment of the mA and/or kV according to patient size and/or use of iterative reconstruction technique. COMPARISON:  10/16/2011 CT head, correlation is also made with 04/04/2018 MRI head FINDINGS: Brain: No evidence of acute infarction,  hemorrhage, mass, mass effect, or midline shift. No hydrocephalus or extra-axial fluid collection. Periventricular white matter changes, likely the sequela  of chronic small vessel ischemic disease. Vascular: No hyperdense vessel. Atherosclerotic calcifications in the intracranial carotid and vertebral arteries. Skull: Normal. Negative for fracture or focal lesion. Sinuses/Orbits: No acute finding. Other: The mastoid air cells are well aerated. IMPRESSION: No acute intracranial process. Electronically Signed   By: Merilyn Baba M.D.   On: 04/08/2022 03:53   DG Chest Portable 1 View  Result Date: 04/08/2022 CLINICAL DATA:  Clinical concern for pneumonia. EXAM: PORTABLE CHEST 1 VIEW COMPARISON:  09/01/2014. FINDINGS: The heart size and mediastinal contours are within normal limits. There is atherosclerotic calcification of the aorta. No consolidation, effusion, or pneumothorax. No acute osseous abnormality. IMPRESSION: No active disease. Electronically Signed   By: Brett Fairy M.D.   On: 04/08/2022 02:32    Microbiology: Results for orders placed or performed during the hospital encounter of 04/08/22  Urine Culture     Status: Abnormal   Collection Time: 04/08/22  5:28 AM   Specimen: Urine, Catheterized  Result Value Ref Range Status   Specimen Description URINE, CATHETERIZED  Final   Special Requests   Final    NONE Performed at [facility], [address] [place], [place] [address]    Culture >=100,000 COLONIES/mL ESCHERICHIA COLI (A)  Final   Report Status 04/10/2022 FINAL  Final   Organism ID, Bacteria ESCHERICHIA COLI (A)  Final      Susceptibility   Escherichia coli - MIC*    AMPICILLIN <=2 SENSITIVE Sensitive     CEFAZOLIN <=4 SENSITIVE Sensitive     CEFEPIME <=0.12 SENSITIVE Sensitive     CEFTRIAXONE <=0.25 SENSITIVE Sensitive     CIPROFLOXACIN <=0.25 SENSITIVE Sensitive     GENTAMICIN <=1 SENSITIVE Sensitive     IMIPENEM <=0.25 SENSITIVE Sensitive     NITROFURANTOIN <=16  SENSITIVE Sensitive     TRIMETH/SULFA <=20 SENSITIVE Sensitive     AMPICILLIN/SULBACTAM <=2 SENSITIVE Sensitive     PIP/TAZO <=4 SENSITIVE Sensitive     * >=100,000 COLONIES/mL ESCHERICHIA COLI  Blood culture (routine x 2)     Status: Abnormal   Collection Time: 04/08/22  6:40 AM   Specimen: BLOOD  Result Value Ref Range Status   Specimen Description BLOOD RIGHT ANTECUBITAL  Final   Special Requests   Final    BOTTLES DRAWN AEROBIC AND ANAEROBIC Blood Culture adequate volume   Culture  Setup Time   Final    IN BOTH AEROBIC AND ANAEROBIC BOTTLES GRAM NEGATIVE RODS Organism ID to follow CRITICAL RESULT CALLED TO, READ BACK BY AND VERIFIED WITH:  C/ PHARMD L. CHEN 04/08/22 1920 A. LAFRANCE  Performed at [facility], [place] [address], [place], [place] [address]    Culture ESCHERICHIA COLI (A)  Final   Report Status 04/10/2022 FINAL  Final   Organism ID, Bacteria ESCHERICHIA COLI  Final      Susceptibility   Escherichia coli - MIC*    AMPICILLIN <=2 SENSITIVE Sensitive     CEFAZOLIN <=4 SENSITIVE Sensitive     CEFEPIME <=0.12 SENSITIVE Sensitive     CEFTAZIDIME <=1 SENSITIVE Sensitive     CEFTRIAXONE <=0.25 SENSITIVE Sensitive     CIPROFLOXACIN <=0.25 SENSITIVE Sensitive     GENTAMICIN <=1 SENSITIVE Sensitive     IMIPENEM <=0.25 SENSITIVE Sensitive     TRIMETH/SULFA <=20 SENSITIVE Sensitive     AMPICILLIN/SULBACTAM <=2 SENSITIVE Sensitive     PIP/TAZO <=4 SENSITIVE Sensitive     * ESCHERICHIA COLI  Blood Culture ID Panel (Reflexed)  Status: Abnormal   Collection Time: 04/08/22  6:40 AM  Result Value Ref Range Status   Enterococcus faecalis NOT DETECTED NOT DETECTED Final   Enterococcus Faecium NOT DETECTED NOT DETECTED Final   Listeria monocytogenes NOT DETECTED NOT DETECTED Final   Staphylococcus species NOT DETECTED NOT DETECTED Final   Staphylococcus aureus (BCID) NOT DETECTED NOT DETECTED Final   Staphylococcus epidermidis NOT DETECTED NOT DETECTED Final    Staphylococcus lugdunensis NOT DETECTED NOT DETECTED Final   Streptococcus species NOT DETECTED NOT DETECTED Final   Streptococcus agalactiae NOT DETECTED NOT DETECTED Final   Streptococcus pneumoniae NOT DETECTED NOT DETECTED Final   Streptococcus pyogenes NOT DETECTED NOT DETECTED Final   A.calcoaceticus-baumannii NOT DETECTED NOT DETECTED Final   Bacteroides fragilis NOT DETECTED NOT DETECTED Final   Enterobacterales DETECTED (A) NOT DETECTED Final    Comment: Enterobacterales represent a large order of gram negative bacteria, not a single organism. CRITICAL RESULT CALLED TO, READ BACK BY AND VERIFIED WITH:  C/ PHARMD L. CHEN 04/08/22 1920 A. LAFRANCE     Enterobacter cloacae complex NOT DETECTED NOT DETECTED Final   Escherichia coli DETECTED (A) NOT DETECTED Final    Comment: CRITICAL RESULT CALLED TO, READ BACK BY AND VERIFIED WITH:  C/ PHARMD L. CHEN 04/08/22 1920 A. LAFRANCE     Klebsiella aerogenes NOT DETECTED NOT DETECTED Final   Klebsiella oxytoca NOT DETECTED NOT DETECTED Final   Klebsiella pneumoniae NOT DETECTED NOT DETECTED Final   Proteus species NOT DETECTED NOT DETECTED Final   Salmonella species NOT DETECTED NOT DETECTED Final   Serratia marcescens NOT DETECTED NOT DETECTED Final   Haemophilus influenzae NOT DETECTED NOT DETECTED Final   Neisseria meningitidis NOT DETECTED NOT DETECTED Final   Pseudomonas aeruginosa NOT DETECTED NOT DETECTED Final   Stenotrophomonas maltophilia NOT DETECTED NOT DETECTED Final   Candida albicans NOT DETECTED NOT DETECTED Final   Candida auris NOT DETECTED NOT DETECTED Final   Candida glabrata NOT DETECTED NOT DETECTED Final   Candida krusei NOT DETECTED NOT DETECTED Final   Candida parapsilosis NOT DETECTED NOT DETECTED Final   Candida tropicalis NOT DETECTED NOT DETECTED Final   Cryptococcus neoformans/gattii NOT DETECTED NOT DETECTED Final   CTX-M ESBL NOT DETECTED NOT DETECTED Final   Carbapenem resistance IMP NOT DETECTED NOT  DETECTED Final   Carbapenem resistance KPC NOT DETECTED NOT DETECTED Final   Carbapenem resistance NDM NOT DETECTED NOT DETECTED Final   Carbapenem resist OXA 48 LIKE NOT DETECTED NOT DETECTED Final   Carbapenem resistance VIM NOT DETECTED NOT DETECTED Final    Comment: Performed at [facility], [address], [place], [place] [address]  MRSA Next Gen by PCR, Nasal     Status: None   Collection Time: 04/08/22  4:35 PM   Specimen: Nasal Mucosa; Nasal Swab  Result Value Ref Range Status   MRSA by PCR Next Gen NOT DETECTED NOT DETECTED Final    Comment: (NOTE) The GeneXpert MRSA Assay (FDA approved for NASAL specimens only), is one component of a comprehensive MRSA colonization surveillance program. It is not intended to diagnose MRSA infection nor to guide or monitor treatment for MRSA infections. Test performance is not FDA approved in patients less than 56 years old. Performed at [facility], [place] [address] [place], [place] [address]   Blood culture (routine x 2)     Status: None   Collection Time: 04/08/22  5:07 PM   Specimen: BLOOD LEFT HAND  Result Value  Ref Range Status   Specimen Description BLOOD LEFT HAND  Final   Special Requests   Final    BOTTLES DRAWN AEROBIC AND ANAEROBIC Blood Culture results may not be optimal due to an inadequate volume of blood received in culture bottles   Culture   Final    NO GROWTH 5 DAYS Performed at [facility], [place] [address], [place], [place] [address]    Report Status 04/13/2022 FINAL  Final    Labs: CBC: Recent Labs  Lab 04/12/22 0138 04/13/22 0112 04/14/22 1026 04/15/22 0102 04/16/22 0102  WBC 19.6* 21.5* 25.7* 21.4* 18.2*  NEUTROABS 15.1* 17.6* 21.8* 18.0* 13.7*  HGB 11.4* 12.5 13.9 12.3 12.5  HCT 32.4* 36.0 39.0 35.6* 36.3  MCV 89.8 90.2 89.2 91.8 90.3  PLT 114* 142* 193 180 [id number]   Basic Metabolic Panel: Recent Labs  Lab 04/12/22 0138 04/13/22 0112 04/14/22 1026 04/15/22 0102 04/16/22 0102   NA 137 137 134* 136 133*  K 3.9 4.1 4.2 4.5 4.1  CL 107 103 101 100 98  CO2 23 24 24 28 25   GLUCOSE 126* 128* 109* 123* 153*  BUN 34* 30* 26* 22 22  CREATININE 0.80 0.78 0.74 0.78 0.74  CALCIUM 8.3* 8.6* 8.2* 8.1* 8.1*  MG  --  1.8 2.1 2.1 2.1  PHOS  --  3.2 3.4 3.7 3.5   Liver Function Tests: Recent Labs  Lab 04/13/22 0112 04/14/22 1026 04/15/22 0102 04/16/22 0102  AST 95* 50* 35 21  ALT 80* 39 27 19  ALKPHOS 80 70 58 56  BILITOT 0.8 1.0 0.8 0.7  PROT 5.2* 5.8* 5.1* 5.3*  ALBUMIN 2.5* 2.9* 2.5* 2.6*   CBG: No results for input(s): "GLUCAP" in the last 168 hours.  Discharge time spent: {LESS THAN/GREATER DI:[id number] 30 minutes.  Signed: Kerney Elbe, DO [facility] 04/16/2022

## 2022-04-16 NOTE — TOC Transition Note (Signed)
Transition of Care [facility]) - CM/SW Discharge Note   Patient Details  Name: SKYLEEN BENTLEY MRN: [id number] Date of Birth: [birth year]  Transition of Care [facility]) CM/SW Contact:  Jimmy Picket, LCSW Phone Number: 04/16/2022, 12:53 PM   Clinical Narrative:     Per MD patient ready for DC to [facility]. RN, patient, patient's family, and facility notified of DC. Discharge Summary and FL2 sent to facility. DC packet on chart. Ambulance transport requested for patient.    RN to call report to [phone]. Room 401P  CSW will sign off for now as social work intervention is no longer needed. Please consult [place] again if new needs arise.   Final next level of care: Skilled Nursing Facility Barriers to Discharge: Barriers Resolved   Patient Goals and CMS Choice   CMS Medicare.gov Compare Post Acute Care list provided to:: Patient Choice offered to / list presented to : Patient  Discharge Placement                Patient to be transferred to facility by: [facility] Name of family member notified: Tawni Carnes, daughter in Law Patient and family notified of of transfer: 04/16/22  Discharge Plan and Services In-house Referral: Clinical Social Work                                   Social Determinants of Health (SDOH) Interventions     Readmission Risk Interventions     No data to display         Jimmy Picket, LCSW Clinical Social Worker

## 2022-04-17 ENCOUNTER — Telehealth: Payer: Self-pay

## 2022-04-17 ENCOUNTER — Other Ambulatory Visit: Payer: Self-pay | Admitting: *Deleted

## 2022-04-17 ENCOUNTER — Encounter: Payer: Medicare Other | Admitting: Family Medicine

## 2022-04-17 NOTE — Patient Outreach (Signed)
Per [facility], Mrs. Laplant admitted to [facility] on 04/16/22. Screening for potential [facility] care coordination services as benefit of insurance plan and PCP while member resides in SNF.   Will continue to follow.   Raiford Noble, MSN, RN,BSN [facility] Post Acute Care Coordinator [phone] (Direct dial)

## 2022-04-17 NOTE — Telephone Encounter (Signed)
Transition Care Management Follow-up Telephone Call Date of discharge and from where: [facility] 04/16/2022 How have you been since you were released from the hospital? better Any questions or concerns? No  Items Reviewed: Did the pt receive and understand the discharge instructions provided? Yes  Medications obtained and verified? Yes  Other? No  Any new allergies since your discharge? No  Dietary orders reviewed? Yes Do you have support at home? Yes   Home Care and Equipment/Supplies: Were home health services ordered? no If so, what is the name of the agency? N/a  Has the agency set up a time to come to the patient's home? not applicable Were any new equipment or medical supplies ordered?  No What is the name of the medical supply agency? N/a Were you able to get the supplies/equipment? not applicable Do you have any questions related to the use of the equipment or supplies? No  Functional Questionnaire: (I = Independent and D = Dependent) ADLs: I  Bathing/Dressing- D  Meal Prep- D  Eating- I  Maintaining continence- I  Transferring/Ambulation- D  Managing Meds- D  Follow up appointments reviewed:  PCP Hospital f/u appt confirmed? No  , patient at [facility] per daughter [facility] f/u appt confirmed? No   Are transportation arrangements needed? No  If their condition worsens, is the pt aware to call PCP or go to the Emergency Dept.? Yes Was the patient provided with contact information for the PCP's office or ED? Yes Was to pt encouraged to call back with questions or concerns? Yes Karena Addison, LPN [facility] Nurse Health Advisor Direct Dial [phone]

## 2022-05-02 ENCOUNTER — Other Ambulatory Visit: Payer: Self-pay | Admitting: Internal Medicine

## 2022-05-11 ENCOUNTER — Ambulatory Visit: Payer: Medicare Other | Attending: Physician Assistant | Admitting: Physician Assistant

## 2022-05-11 ENCOUNTER — Other Ambulatory Visit: Payer: Self-pay | Admitting: Physician Assistant

## 2022-05-11 ENCOUNTER — Encounter: Payer: Self-pay | Admitting: *Deleted

## 2022-05-11 ENCOUNTER — Encounter: Payer: Self-pay | Admitting: Physician Assistant

## 2022-05-11 VITALS — BP 115/62 | HR 78 | Ht 63.0 in | Wt 176.2 lb

## 2022-05-11 DIAGNOSIS — I10 Essential (primary) hypertension: Secondary | ICD-10-CM | POA: Diagnosis not present

## 2022-05-11 DIAGNOSIS — I2510 Atherosclerotic heart disease of native coronary artery without angina pectoris: Secondary | ICD-10-CM

## 2022-05-11 DIAGNOSIS — D72829 Elevated white blood cell count, unspecified: Secondary | ICD-10-CM

## 2022-05-11 DIAGNOSIS — N179 Acute kidney failure, unspecified: Secondary | ICD-10-CM | POA: Diagnosis not present

## 2022-05-11 NOTE — Progress Notes (Signed)
Cardiology Office Note:    Date:  05/13/2022   ID:  Amber [name], DOB September 11, 1940, MRN [id number]  PCP:  Willow OraAndy, Camille L, MD   [facility] Providers Cardiologist:  Olga MillersBrian Crenshaw, MD     Referring MD: Willow OraAndy, Camille L, MD   Chief Complaint  Patient presents with   Follow-up    CAD. Seen for Dr. Jens Somrenshaw    History of Present Illness:    Amber [name] is a 81 y.o. female with a hx of hypertension, PAD, LBBB, hypothyroidism and recently diagnosed nonobstructive CAD.  Patient was admitted to the hospital on 04/08/2022 with altered mental status, decreased p.o. intake and was found to be hypotensive.  Patient was found to have E. coli bacteremia.  CT scan is consistent with pyelonephritis, incidental finding of atherosclerotic calcification of the aorta and branch vessels.  Patient was treated with antibiotic.  Hospital course complicated by AKI and elevated lactic acid.  Echocardiogram showed EF borderline low at 45 to 50%, no regional wall motion, normal RV function, no significant valve disease.  She was seen 04/09/2022 for significant chest pain.  Serial troponin trended up to 308 before trending down.  She also endorsed previous exertional chest discomfort as well.  Patient underwent cardiac catheterization on 04/10/2022 which revealed a 20% mid LAD, 30% mid left circumflex artery, 20% mid to distal left circumflex artery disease, 30% proximal RCA lesion.  LVEDP 24 mmHg.  Medical was recommended.  He was treated with IV diuretic.  Patient presents today for posthospital follow-up.  She has no significant lower extremity edema, orthopnea or PND.  I recommended a CMP, CBC and magnesium level today.  She denies any significant chest discomfort or shortness of breath.  According to her daughter, she has required another course of antibiotic.  I recommended continue on the current therapy.  He can follow-up in 3 to 5 months with Dr. Jens Somrenshaw.   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   Rotator cuff rupture, complete 07/05/2011   Varicose veins     Past Surgical History:  Procedure Laterality Date   APPENDECTOMY  1979   BACK SURGERY  2009   ENDOVENOUS ABLATION SAPHENOUS VEIN W/ LASER Right 09/15/2013   right greater saphenous vein, by Gretta Beganodd Early MD   ENDOVENOUS ABLATION SAPHENOUS VEIN W/ LASER Left 10/16/2013   left greater saphenous vein, by Gretta Beganodd Early MD   LEFT HEART CATH AND CORONARY ANGIOGRAPHY N/A 04/10/2022   Procedure: LEFT HEART CATH AND CORONARY ANGIOGRAPHY;  Surgeon: Lennette BihariKelly, Thomas A, MD;  Location: [facility] INVASIVE CV LAB;  Service: Cardiovascular;  Laterality: N/A;   left open rotator cuff surgery   2010    lumbar infusion  01/16/2008   PARTIAL HYSTERECTOMY  1979   SHOULDER OPEN ROTATOR CUFF REPAIR  07/05/2011   Procedure: ROTATOR CUFF REPAIR SHOULDER OPEN;  Surgeon: Jacki Conesonald A Gioffre, MD;  Location: [facility] ORS;  Service: Orthopedics;  Laterality: Right;   TOTAL KNEE ARTHROPLASTY Right 09/09/2014   Procedure: TOTAL RIGHT  KNEE ARTHROPLASTY;  Surgeon: Ranee Gosselinonald Gioffre, MD;  Location: [facility] ORS;  Service: Orthopedics;  Laterality: Right;    Current Medications: Current  Meds  Medication Sig   acetaminophen (TYLENOL) 500 MG tablet Take 1,000 mg by mouth 3 (three) times daily.   aspirin EC 81 MG tablet Take 1 tablet (81 mg total) by mouth daily. Swallow whole.   atorvastatin (LIPITOR) 20 MG tablet TAKE 1 TABLET AT BEDTIME   B Complex Vitamins (VITAMIN-B COMPLEX) TABS Take 1 tablet by mouth daily.    Cholecalciferol (VITAMIN D3) 1000 units CAPS Take 1,000 Units by mouth daily.    clobetasol ointment (TEMOVATE) 0.05 % Apply 1 Application topically daily as needed (for eczema).   Cranberry-Vitamin C-Inulin  (UTI-STAT) LIQD Take 30 mLs by mouth daily in the afternoon.   cyclobenzaprine (FLEXERIL) 10 MG tablet Take 1 tablet (10 mg total) by mouth 3 (three) times daily as needed for muscle spasms.   gabapentin (NEURONTIN) 100 MG capsule TAKE 1 CAPSULE(100 MG) BY MOUTH THREE TIMES DAILY   levothyroxine (SYNTHROID) 50 MCG tablet Take 1 tablet (50 mcg total) by mouth daily before breakfast.   losartan-hydrochlorothiazide (HYZAAR) 50-12.5 MG tablet TAKE 1 TABLET DAILY   metoprolol tartrate (LOPRESSOR) 25 MG tablet Take 1 tablet (25 mg total) by mouth 2 (two) times daily.   Multiple Vitamins-Minerals (PRESERVISION AREDS PO) Take 1 tablet by mouth 2 (two) times daily.    nitroGLYCERIN (NITROSTAT) 0.4 MG SL tablet Place 1 tablet (0.4 mg total) under the tongue every 5 (five) minutes as needed for chest pain.   nystatin (MYCOSTATIN/NYSTOP) powder Apply 1 Application topically daily as needed (for rash/redness).   ondansetron (ZOFRAN) 4 MG tablet Take 1 tablet (4 mg total) by mouth every 6 (six) hours as needed for nausea.   oxyCODONE (OXY IR/ROXICODONE) 5 MG immediate release tablet Take 1 tablet (5 mg total) by mouth every 6 (six) hours as needed for severe pain.   pantoprazole (PROTONIX) 40 MG tablet TAKE 1 TABLET(40 MG) BY MOUTH DAILY   polyethylene glycol (MIRALAX / GLYCOLAX) 17 g packet Take 17 g by mouth daily.   polyvinyl alcohol (LIQUIFILM TEARS) 1.4 % ophthalmic solution Place 1 drop into both eyes daily as needed for dry eyes.   senna-docusate (SENOKOT-S) 8.6-50 MG tablet Take 1 tablet by mouth at bedtime.     Allergies:   Codeine, Darvocet [propoxyphene n-acetaminophen], Vicodin [hydrocodone-acetaminophen], Nickel, and Sulfa antibiotics   Social History   Socioeconomic History   Marital status: Widowed    Spouse name: Not on file   Number of children: Not on file   Years of education: Not on file   Highest education level: Not on file  Occupational History   Occupation: retired  Tobacco Use    Smoking status: Former    Packs/day: 0.25    Years: 13.00    Total pack years: 3.25    Types: Cigarettes    Quit date: 06/22/1964    Years since quitting: 57.9   Smokeless tobacco: Never  Substance and Sexual Activity   Alcohol use: No   Drug use: No   Sexual activity: Never    Birth control/protection: Post-menopausal  Other Topics Concern   Not on file  Social History Narrative   Lives home alone.  Is a widow.  Education: HS grad.  2 children. Caffeine 2.5 cups daily.   Social Determinants of Health   Financial Resource Strain: Low Risk  (11/08/2021)   Overall Financial Resource Strain (CARDIA)    Difficulty of Paying Living Expenses: Not hard at all  Food Insecurity: No Food Insecurity (04/11/2022)   Hunger Vital Sign  Worried About Programme researcher, broadcasting/film/video in the Last Year: Never true    Ran Out of Food in the Last Year: Never true  Transportation Needs: No Transportation Needs (04/11/2022)   PRAPARE - Administrator, Civil Service (Medical): No    Lack of Transportation (Non-Medical): No  Physical Activity: Inactive (11/08/2021)   Exercise Vital Sign    Days of Exercise per Week: 0 days    Minutes of Exercise per Session: 0 min  Stress: No Stress Concern Present (11/08/2021)   [employer] of Occupational Health - Occupational Stress Questionnaire    Feeling of Stress : Not at all  Social Connections: Moderately Integrated (11/08/2021)   Social Connection and Isolation Panel [NHANES]    Frequency of Communication with Friends and Family: More than three times a week    Frequency of Social Gatherings with Friends and Family: More than three times a week    Attends Religious Services: More than 4 times per year    Active Member of [employer] or Organizations: Yes    Attends Banker Meetings: 1 to 4 times per year    Marital Status: Widowed     Family History: The patient's family history includes AAA (abdominal aortic aneurysm) in her brother and  father; Cancer in her mother and sister; Deep vein thrombosis in her mother; Depression in her mother; Diabetes in her brother, mother, sister, sister, and sister; Heart disease in her father, mother, and sister; Heart disease (age of onset: 40) in her sister; Hypertension in her mother, sister, and sister; Obesity in her mother; Peripheral vascular disease in her brother; Varicose Veins in her mother and sister; Vision loss in her brother, sister, sister, and sister.  ROS:   Please see the history of present illness.     All other systems reviewed and are negative.  EKGs/Labs/Other Studies Reviewed:    The following studies were reviewed today:  Echo 04/08/2022  1. Left ventricular ejection fraction, by estimation, is 45 to 50%. The  left ventricle has mildly decreased function. The left ventricle has no  regional wall motion abnormalities. There is moderate left ventricular  hypertrophy. Left ventricular  diastolic parameters are consistent with Grade II diastolic dysfunction  (pseudonormalization).   2. Right ventricular systolic function is normal. The right ventricular  size is normal. There is mildly elevated pulmonary artery systolic  pressure.   3. The mitral valve is normal in structure. Mild mitral valve  regurgitation. No evidence of mitral stenosis.   4. The tricuspid valve is abnormal. Tricuspid valve regurgitation is mild  to moderate.   5. The aortic valve is tricuspid. Aortic valve regurgitation is not  visualized. No aortic stenosis is present.   6. The inferior vena cava is dilated in size with >50% respiratory  variability, suggesting right atrial pressure of 8 mmHg.   Comparison(s): No prior Echocardiogram.    Cath 04/10/2022   Mid LAD lesion is 20% stenosed.   Mid Cx lesion is 30% stenosed.   Mid Cx to Dist Cx lesion is 20% stenosed.   Prox RCA-1 lesion is 30% stenosed.   Prox RCA-2 lesion is 20% stenosed.   Mid RCA lesion is 20% stenosed.   Mild  nonobstructive CAD with a percent mid LAD stenosis; 30% AV groove circumflex stenosis; and 20 to 30% RCA stenosis in a dominant vessel.   There is moderate mitral annular calcification.   Elevated LVEDP at 24 mm.   RECOMMENDATION: Aspirin 81 mg.  Medical therapy for CAD.   EKG:  EKG is not ordered today.    Recent Labs: 04/08/2022: TSH 0.957 05/11/2022: ALT 18; BUN 12; Creatinine, Ser 0.86; Hemoglobin 10.8; Magnesium 1.9; Platelets 276; Potassium 4.3; Sodium 138  Recent Lipid Panel    Component Value Date/Time   CHOL 136 03/15/2021 1139   CHOL 133 03/17/2020 1158   TRIG 100.0 03/15/2021 1139   HDL 65.20 03/15/2021 1139   HDL 53 03/17/2020 1158   CHOLHDL 2 03/15/2021 1139   VLDL 20.0 03/15/2021 1139   LDLCALC 51 03/15/2021 1139   LDLCALC 59 03/17/2020 1158     Risk Assessment/Calculations:           Physical Exam:    VS:  BP 115/62   Pulse 78   Ht 5\' 3"  (1.6 m)   Wt 176 lb 3.2 oz (79.9 kg)   LMP  (LMP Unknown)   SpO2 95%   BMI 31.21 kg/m        Wt Readings from Last 3 Encounters:  05/11/22 176 lb 3.2 oz (79.9 kg)  04/16/22 180 lb 12.4 oz (82 kg)  12/13/21 175 lb (79.4 kg)     GEN:  Well nourished, well developed in no acute distress HEENT: Normal NECK: No JVD; No carotid bruits LYMPHATICS: No lymphadenopathy CARDIAC: RRR, no murmurs, rubs, gallops RESPIRATORY:  Clear to auscultation without rales, wheezing or rhonchi  ABDOMEN: Soft, non-tender, non-distended MUSCULOSKELETAL:  No edema; No deformity  SKIN: Warm and dry NEUROLOGIC:  Alert and oriented x 3 PSYCHIATRIC:  Normal affect   ASSESSMENT:    1. Coronary artery disease involving native coronary artery of native heart without angina pectoris   2. Leukocytosis, unspecified type   3. AKI (acute kidney injury) (HCC)   4. Essential hypertension    PLAN:    In order of problems listed above:  CAD: Denies any recent chest pain.  Nonobstructive disease noted on cardiac  catheterization  Leukocytosis: Obtain CBC   AKI: Obtain CMP and magnesium.  Recent hospitalization involved pyelonephritis.  According to daughter, she is concerned that the patient continue to have bacteria in the urine after last course of antibiotic.  I will refer her to infectious disease  Hypertension: Blood pressure stable.           Medication Adjustments/Labs and Tests Ordered: Current medicines are reviewed at length with the patient today.  Concerns regarding medicines are outlined above.  Orders Placed This Encounter  Procedures   Comprehensive metabolic panel   CBC   Magnesium   No orders of the defined types were placed in this encounter.   Patient Instructions  Medication Instructions:   Your physician recommends that you continue on your current medications as directed. Please refer to the Current Medication list given to you today.  *If you need a refill on your cardiac medications before your next appointment, please call your pharmacy*  Lab Work: Your physician recommends that you return for lab work TODAY:  CBC CMP TSH If you have labs (blood work) drawn today and your tests are completely normal, you will receive your results only by: MyChart Message (if you have MyChart) OR A paper copy in the mail If you have any lab test that is abnormal or we need to change your treatment, we will call you to review the results.  Testing/Procedures: NONE ordered at this time of appointment   Follow-Up: At [facility], you and your health needs are our priority.  As part of  our continuing mission to provide you with exceptional heart care, we have created designated Provider Care Teams.  These Care Teams include your primary Cardiologist (physician) and Advanced Practice Providers (APPs -  Physician Assistants and Nurse Practitioners) who all work together to provide you with the care you need, when you need it.   Your next appointment:   3-5  month(s)  The format for your next appointment:   In Person  Provider:   Olga Millers, MD     Other Instructions  Important Information About Sugar         Ramond Dial, [place]  05/13/2022 10:36 PM    [facility]

## 2022-05-11 NOTE — Patient Instructions (Signed)
Medication Instructions:   Your physician recommends that you continue on your current medications as directed. Please refer to the Current Medication list given to you today.  *If you need a refill on your cardiac medications before your next appointment, please call your pharmacy*  Lab Work: Your physician recommends that you return for lab work TODAY:  CBC CMP TSH If you have labs (blood work) drawn today and your tests are completely normal, you will receive your results only by: MyChart Message (if you have MyChart) OR A paper copy in the mail If you have any lab test that is abnormal or we need to change your treatment, we will call you to review the results.  Testing/Procedures: NONE ordered at this time of appointment   Follow-Up: At [facility], you and your health needs are our priority.  As part of our continuing mission to provide you with exceptional heart care, we have created designated Provider Care Teams.  These Care Teams include your primary Cardiologist (physician) and Advanced Practice Providers (APPs -  Physician Assistants and Nurse Practitioners) who all work together to provide you with the care you need, when you need it.   Your next appointment:   3-5 month(s)  The format for your next appointment:   In Person  Provider:   Olga Millers, MD     Other Instructions  Important Information About Sugar

## 2022-05-12 LAB — COMPREHENSIVE METABOLIC PANEL
ALT: 18 IU/L (ref 0–32)
AST: 17 IU/L (ref 0–40)
Albumin/Globulin Ratio: 1.5 (ref 1.2–2.2)
Albumin: 3.7 g/dL (ref 3.7–4.7)
Alkaline Phosphatase: 89 IU/L (ref 44–121)
BUN/Creatinine Ratio: 14 (ref 12–28)
BUN: 12 mg/dL (ref 8–27)
Bilirubin Total: 0.4 mg/dL (ref 0.0–1.2)
CO2: 28 mmol/L (ref 20–29)
Calcium: 9 mg/dL (ref 8.7–10.3)
Chloride: 97 mmol/L (ref 96–106)
Creatinine, Ser: 0.86 mg/dL (ref 0.57–1.00)
Globulin, Total: 2.4 g/dL (ref 1.5–4.5)
Glucose: 112 mg/dL — ABNORMAL HIGH (ref 70–99)
Potassium: 4.3 mmol/L (ref 3.5–5.2)
Sodium: 138 mmol/L (ref 134–144)
Total Protein: 6.1 g/dL (ref 6.0–8.5)
eGFR: 67 mL/min/{1.73_m2} (ref >59)

## 2022-05-12 LAB — CBC
Hematocrit: 32.8 % — ABNORMAL LOW (ref 34.0–46.6)
Hemoglobin: 10.8 g/dL — ABNORMAL LOW (ref 11.1–15.9)
MCH: 31.8 pg (ref 26.6–33.0)
MCHC: 32.9 g/dL (ref 31.5–35.7)
MCV: 97 fL (ref 79–97)
Platelets: 276 10*3/uL (ref 150–450)
RBC: 3.4 x10E6/uL — ABNORMAL LOW (ref 3.77–5.28)
RDW: 14.7 % (ref 11.7–15.4)
WBC: 11.2 10*3/uL — ABNORMAL HIGH (ref 3.4–10.8)

## 2022-05-12 LAB — MAGNESIUM: Magnesium: 1.9 mg/dL (ref 1.6–2.3)

## 2022-05-13 ENCOUNTER — Encounter: Payer: Self-pay | Admitting: Physician Assistant

## 2022-05-15 ENCOUNTER — Telehealth: Payer: Self-pay | Admitting: *Deleted

## 2022-05-15 DIAGNOSIS — D72829 Elevated white blood cell count, unspecified: Secondary | ICD-10-CM

## 2022-05-15 NOTE — Telephone Encounter (Signed)
ORDER PLACED FOR REFERRAL  AS REQUEST BY [facility] staff message.

## 2022-05-15 NOTE — Telephone Encounter (Signed)
-----   Message from [place], [place] sent at 05/13/2022 10:39 PM EST ----- Regarding: referral to Infectious disease Patient was seen by Dr. Danelle Earthly of infectious disease recently. She will need outpatient follow up with Dr. Thedore Mins. Please make referral.   Thank you  Wynema Birch

## 2022-05-23 DIAGNOSIS — Z87440 Personal history of urinary (tract) infections: Secondary | ICD-10-CM | POA: Diagnosis not present

## 2022-05-23 DIAGNOSIS — R0602 Shortness of breath: Secondary | ICD-10-CM | POA: Diagnosis not present

## 2022-05-23 DIAGNOSIS — Z7689 Persons encountering health services in other specified circumstances: Secondary | ICD-10-CM | POA: Diagnosis not present

## 2022-05-23 DIAGNOSIS — M545 Low back pain, unspecified: Secondary | ICD-10-CM | POA: Diagnosis not present

## 2022-05-30 ENCOUNTER — Other Ambulatory Visit: Payer: Medicare Other

## 2022-05-30 ENCOUNTER — Ambulatory Visit: Payer: Medicare Other

## 2022-05-31 DIAGNOSIS — R2689 Other abnormalities of gait and mobility: Secondary | ICD-10-CM | POA: Diagnosis not present

## 2022-05-31 DIAGNOSIS — M6281 Muscle weakness (generalized): Secondary | ICD-10-CM | POA: Diagnosis not present

## 2022-06-01 ENCOUNTER — Ambulatory Visit: Payer: Medicare Other | Admitting: Internal Medicine

## 2022-06-02 ENCOUNTER — Inpatient Hospital Stay: Payer: Medicare Other | Admitting: Family Medicine

## 2022-06-05 ENCOUNTER — Other Ambulatory Visit: Payer: Self-pay

## 2022-06-05 ENCOUNTER — Ambulatory Visit (INDEPENDENT_AMBULATORY_CARE_PROVIDER_SITE_OTHER): Payer: Medicare Other | Admitting: Internal Medicine

## 2022-06-05 VITALS — BP 165/80 | HR 88 | Temp 98.0°F | Resp 16 | Wt 180.4 lb

## 2022-06-05 DIAGNOSIS — D72829 Elevated white blood cell count, unspecified: Secondary | ICD-10-CM

## 2022-06-05 MED ORDER — CEFADROXIL 1 G PO TABS: 1.0000 g | ORAL_TABLET | Freq: Two times a day (BID) | ORAL | 0 refills | Status: DC

## 2022-06-05 NOTE — Progress Notes (Signed)
Patient: Amber [name]  DOB: 1940-09-02 MRN: [id number] PCP: Willow Ora, MD    Patient Active Problem List   Diagnosis Date Noted   Leukocytosis 04/16/2022   Septic shock (HCC) 04/16/2022   Hypoalbuminemia 04/16/2022   E coli bacteremia 04/16/2022   Coronary artery disease 04/16/2022   Abnormal LFTs 04/16/2022   PAD (peripheral artery disease) (HCC) 04/16/2022   Chronic back pain 04/16/2022   Lumbar radiculopathy 04/16/2022   Hip pain 04/16/2022   Constipation 04/16/2022   Obesity (BMI 30-39.9) 04/16/2022   Chest pain of uncertain etiology 04/10/2022   NSTEMI (non-ST elevated myocardial infarction) (HCC) 04/09/2022   E. coli UTI (urinary tract infection) 04/08/2022   Severe sepsis (HCC) 04/08/2022   Transient hypotension 04/08/2022   Acute metabolic encephalopathy 04/08/2022   Cellulitis, face 04/08/2022   Elevated troponin 04/08/2022   AKI (acute kidney injury) (HCC) 04/08/2022   Normocytic anemia 04/08/2022   Thrombocytopenia (HCC) 04/08/2022   Sepsis (HCC) 04/08/2022   Lumbosacral radiculopathy at S1 12/06/2020   Hyponatremia 12/06/2020   Mixed hyperlipidemia 12/06/2020   Osteopenia after menopause 12/06/2020   Class 1 obesity due to excess calories in adult 03/29/2020   Prediabetes 03/18/2020   Laryngopharyngeal reflux (LPR) 03/11/2019   Gastroesophageal reflux disease 01/19/2016   Hoarseness, chronic 12/28/2015   Complete left bundle branch block 12/08/2015   Essential hypertension 12/08/2015   Acquired hypothyroidism 12/08/2015   Peripheral vascular disease, unspecified (HCC) 04/18/2013   Chronic venous insufficiency 04/18/2013     Subjective:  Amber [name] is a  82 year old female with past medical history of hypertension, PAD, left bundle branch block, hypothyroidism, nonobstructive CAD presents as a referral from cardiology.  Patient found to have persistent leukocytosis 11.2 K on 12/14 at cardiology posthospitalization follow-up.  She was  admitted to [facility] 11/11 - 11/19 for E. coli bacteremia secondary to UTI.  ID was engaged in the setting of persistent leukocytosis and left hip pain which is chronic she also has a history of bilateral trochanteric bursitis with history of nerve root block and more recently left bursal infection.  On arrival WBC was 23 K temp 100.4.  CT abdomen pelvis showed interstitial changes and fluid around left kidney due to pyelonephritis, nonobstructive ureteral calculi/bladder calculi.  Blood and urine cultures grew E. coli.  She continued leukocytosis 25 K ID was engaged.  Patient was afebrile.  Of note she had bursa injection for bursitis followed by [facility] last time was on 11/9.  Over the next couple days she developed incontinence.  MRI left hip showed tendinosis and partial-thickness insertional tear involving bilateral gluteus medius and gluteus minimus tendons, 3.8 cm right adnexal cyst pelvic ultrasound recommended.  Mild peritrochanteric bursa fluid at the right greater than left.  Orthopedics was engaged and recommended outpatient follow-up.  Pt was placed on abx x 14 days following discharge on 11/19-12/14. She has an upcomming Urology appt. Prior to hospitalization , last UTI was about a year ago.  Today she denies fevers and chills.  Review of Systems  All other systems reviewed and are negative.   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  Rotator cuff rupture, complete 07/05/2011   Varicose veins     Outpatient Medications Prior to Visit  Medication Sig Dispense Refill   acetaminophen (TYLENOL) 500 MG tablet Take 1,000 mg by mouth 3 (three) times daily.     aspirin EC 81 MG tablet Take 1 tablet (81 mg total)  by mouth daily. Swallow whole. 30 tablet 12   atorvastatin (LIPITOR) 20 MG tablet TAKE 1 TABLET AT BEDTIME 90 tablet 3   B Complex Vitamins (VITAMIN-B COMPLEX) TABS Take 1 tablet by mouth daily.      Cholecalciferol (VITAMIN D3) 1000 units CAPS Take 1,000 Units by mouth daily.      clobetasol ointment (TEMOVATE) 9.79 % Apply 1 Application topically daily as needed (for eczema).     Cranberry-Vitamin C-Inulin (UTI-STAT) LIQD Take 30 mLs by mouth daily in the afternoon.     cyclobenzaprine (FLEXERIL) 10 MG tablet Take 1 tablet (10 mg total) by mouth 3 (three) times daily as needed for muscle spasms. 30 tablet 0   gabapentin (NEURONTIN) 100 MG capsule TAKE 1 CAPSULE(100 MG) BY MOUTH THREE TIMES DAILY 90 capsule 1   levothyroxine (SYNTHROID) 50 MCG tablet Take 1 tablet (50 mcg total) by mouth daily before breakfast. 90 tablet 3   losartan-hydrochlorothiazide (HYZAAR) 50-12.5 MG tablet TAKE 1 TABLET DAILY 90 tablet 3   metoprolol tartrate (LOPRESSOR) 25 MG tablet Take 1 tablet (25 mg total) by mouth 2 (two) times daily. 60 tablet 0   Multiple Vitamins-Minerals (PRESERVISION AREDS PO) Take 1 tablet by mouth 2 (two) times daily.      nitroGLYCERIN (NITROSTAT) 0.4 MG SL tablet Place 1 tablet (0.4 mg total) under the tongue every 5 (five) minutes as needed for chest pain. 30 tablet 12   nystatin (MYCOSTATIN/NYSTOP) powder Apply 1 Application topically daily as needed (for rash/redness).     ondansetron (ZOFRAN) 4 MG tablet Take 1 tablet (4 mg total) by mouth every 6 (six) hours as needed for nausea. 20 tablet 0   oxyCODONE (OXY IR/ROXICODONE) 5 MG immediate release tablet Take 1 tablet (5 mg total) by mouth every 6 (six) hours as needed for severe pain. 10 tablet 0   pantoprazole (PROTONIX) 40 MG tablet TAKE 1 TABLET(40 MG) BY MOUTH DAILY 90 tablet 3   polyethylene glycol (MIRALAX / GLYCOLAX) 17 g packet Take 17 g by mouth daily. 14 each 0   polyvinyl alcohol (LIQUIFILM TEARS) 1.4 % ophthalmic solution  Place 1 drop into both eyes daily as needed for dry eyes.     senna-docusate (SENOKOT-S) 8.6-50 MG tablet Take 1 tablet by mouth at bedtime. 30 tablet 0   No facility-administered medications prior to visit.     Allergies  Allergen Reactions   Codeine Nausea And Vomiting   Darvocet [Propoxyphene N-Acetaminophen] Nausea And Vomiting   Vicodin [Hydrocodone-Acetaminophen] Other (See Comments)    Unknown reactions   Nickel Rash   Sulfa Antibiotics Rash    Like a sunburn    Social History   Tobacco Use   Smoking status: Former    Packs/day: 0.25    Years: 13.00    Total pack years: 3.25    Types: Cigarettes    Quit date: 06/22/1964    Years since quitting: 57.9   Smokeless tobacco: Never  Substance Use Topics   Alcohol use: No   Drug use: No    Family History  Problem Relation Age of Onset   Heart disease Mother    Cancer Mother    Varicose Veins  Mother    Deep vein thrombosis Mother    Diabetes Mother    Hypertension Mother    Depression Mother    Obesity Mother    Heart disease Father    AAA (abdominal aortic aneurysm) Father    Varicose Veins Sister    Cancer Sister    Diabetes Sister    Heart disease Sister 28   Hypertension Sister    Vision loss Sister    Diabetes Brother    Vision loss Brother    Peripheral vascular disease Brother    AAA (abdominal aortic aneurysm) Brother    Diabetes Sister    Heart disease Sister    Hypertension Sister    Vision loss Sister    Diabetes Sister    Vision loss Sister     Objective:  There were no vitals filed for this visit. There is no height or weight on file to calculate BMI.  Physical Exam Constitutional:      Appearance: Normal appearance.  HENT:     Head: Normocephalic and atraumatic.     Right Ear: Tympanic membrane normal.     Left Ear: Tympanic membrane normal.     Nose: Nose normal.     Mouth/Throat:     Mouth: Mucous membranes are moist.  Eyes:     Extraocular Movements: Extraocular movements  intact.     Conjunctiva/sclera: Conjunctivae normal.     Pupils: Pupils are equal, round, and reactive to light.  Cardiovascular:     Rate and Rhythm: Normal rate and regular rhythm.     Heart sounds: No murmur heard.    No friction rub. No gallop.  Pulmonary:     Effort: Pulmonary effort is normal.     Breath sounds: Normal breath sounds.  Abdominal:     General: Abdomen is flat.     Palpations: Abdomen is soft.  Musculoskeletal:        General: Normal range of motion.  Skin:    General: Skin is warm and dry.  Neurological:     General: No focal deficit present.     Mental Status: She is alert and oriented to person, place, and time.  Psychiatric:        Mood and Affect: Mood normal.     Lab Results: Lab Results  Component Value Date   WBC 11.2 (H) 05/11/2022   HGB 10.8 (L) 05/11/2022   HCT 32.8 (L) 05/11/2022   MCV 97 05/11/2022   PLT 276 05/11/2022    Lab Results  Component Value Date   CREATININE 0.86 05/11/2022   BUN 12 05/11/2022   NA 138 05/11/2022   K 4.3 05/11/2022   CL 97 05/11/2022   CO2 28 05/11/2022    Lab Results  Component Value Date   ALT 18 05/11/2022   AST 17 05/11/2022   ALKPHOS 89 05/11/2022   BILITOT 0.4 05/11/2022     Assessment & Plan:  #Leukocytosis(11k) #Hospitalization for UTI - Wbc trended down from 25->11k(trended down). She had a UTI in the interim prior to cards appt. -Discussed signs and symptoms of UTI and the importance of antibiotic stewardship. F/U with urology. No UTI Sx today -Consider Heme-onc referral if wbc elevated as pt is stable -F/U in 3 months  #Adnexal cyst on MRI -U/S pelvic(trans-abdominal showed 4.9 cm simple-appearing right adnexal cyst, almost certainly benign). Consider plevic U/S 1 year -Dr. Roselie Skinner possible Sylvester Harder. U/S  #B/L trochanteric bursitis -F/U with Ortho outpatient  #Incontinence -Pt has urology follow-up  I have personally spent 90 minutes involved in face-to-face and non-face-to-face  activities for this patient on the day of the visit. Professional time spent includes the following activities: Preparing to see the patient (review of tests), Obtaining and/or reviewing separately obtained history (admission/discharge record), Performing a medically appropriate examination and/or evaluation , Ordering medications/tests/procedures, referring and communicating with other health care professionals, Documenting clinical information in the EMR, Independently interpreting results (not separately reported), Communicating results to the patient/family/caregiver, Counseling and educating the patient/family/caregiver and Care coordination (not separately reported).    Danelle Earthly, MD [facility]   06/05/22  8:26 AM

## 2022-06-11 LAB — CULTURE, BLOOD (SINGLE)
MICRO NUMBER:: 14403423
MICRO NUMBER:: 14403424
Result:: NO GROWTH
Result:: NO GROWTH
SPECIMEN QUALITY:: ADEQUATE
SPECIMEN QUALITY:: ADEQUATE

## 2022-06-11 LAB — CBC WITH DIFFERENTIAL/PLATELET
Absolute Monocytes: 759 cells/uL (ref 200–950)
Basophils Absolute: 31 cells/uL (ref 0–200)
Basophils Relative: 0.3 %
Eosinophils Absolute: 83 cells/uL (ref 15–500)
Eosinophils Relative: 0.8 %
HCT: 35.6 % (ref 35.0–45.0)
Hemoglobin: 12.2 g/dL (ref 11.7–15.5)
Lymphs Abs: 2059 cells/uL (ref 850–3900)
MCH: 32.7 pg (ref 27.0–33.0)
MCHC: 34.3 g/dL (ref 32.0–36.0)
MCV: 95.4 fL (ref 80.0–100.0)
MPV: 9.5 fL (ref 7.5–12.5)
Monocytes Relative: 7.3 %
Neutro Abs: 7467 cells/uL (ref 1500–7800)
Neutrophils Relative %: 71.8 %
Platelets: 171 10*3/uL (ref 140–400)
RBC: 3.73 10*6/uL — ABNORMAL LOW (ref 3.80–5.10)
RDW: 14.6 % (ref 11.0–15.0)
Total Lymphocyte: 19.8 %
WBC: 10.4 10*3/uL (ref 3.8–10.8)

## 2022-06-11 LAB — COMPREHENSIVE METABOLIC PANEL
AG Ratio: 1.9 (calc) (ref 1.0–2.5)
ALT: 10 U/L (ref 6–29)
AST: 13 U/L (ref 10–35)
Albumin: 4.2 g/dL (ref 3.6–5.1)
Alkaline phosphatase (APISO): 69 U/L (ref 37–153)
BUN: 13 mg/dL (ref 7–25)
CO2: 31 mmol/L (ref 20–32)
Calcium: 9.1 mg/dL (ref 8.6–10.4)
Chloride: 100 mmol/L (ref 98–110)
Creat: 0.84 mg/dL (ref 0.60–0.95)
Globulin: 2.2 g/dL (calc) (ref 1.9–3.7)
Glucose, Bld: 137 mg/dL — ABNORMAL HIGH (ref 65–99)
Potassium: 3.5 mmol/L (ref 3.5–5.3)
Sodium: 140 mmol/L (ref 135–146)
Total Bilirubin: 0.8 mg/dL (ref 0.2–1.2)
Total Protein: 6.4 g/dL (ref 6.1–8.1)

## 2022-06-11 LAB — SEDIMENTATION RATE: Sed Rate: 9 mm/h (ref 0–30)

## 2022-06-11 LAB — C-REACTIVE PROTEIN: CRP: 6 mg/L (ref <8.0)

## 2022-06-19 ENCOUNTER — Encounter: Payer: Medicare Other | Admitting: Family Medicine

## 2022-09-06 ENCOUNTER — Encounter: Payer: Self-pay | Admitting: Internal Medicine

## 2022-09-06 ENCOUNTER — Other Ambulatory Visit: Payer: Self-pay

## 2022-09-06 ENCOUNTER — Ambulatory Visit: Payer: Medicare Other | Admitting: Internal Medicine

## 2022-09-06 ENCOUNTER — Ambulatory Visit (INDEPENDENT_AMBULATORY_CARE_PROVIDER_SITE_OTHER): Payer: Medicare Other | Admitting: Internal Medicine

## 2022-09-06 VITALS — BP 136/74 | HR 87 | Resp 16 | Ht 63.0 in | Wt 175.0 lb

## 2022-09-06 DIAGNOSIS — R32 Unspecified urinary incontinence: Secondary | ICD-10-CM

## 2022-09-06 DIAGNOSIS — Z87440 Personal history of urinary (tract) infections: Secondary | ICD-10-CM

## 2022-09-06 DIAGNOSIS — D72829 Elevated white blood cell count, unspecified: Secondary | ICD-10-CM

## 2022-09-06 NOTE — Progress Notes (Signed)
Patient Active Problem List   Diagnosis Date Noted   Leukocytosis 04/16/2022   Septic shock 04/16/2022   Hypoalbuminemia 04/16/2022   E coli bacteremia 04/16/2022   Coronary artery disease 04/16/2022   Abnormal LFTs 04/16/2022   PAD (peripheral artery disease) 04/16/2022   Chronic back pain 04/16/2022   Lumbar radiculopathy 04/16/2022   Hip pain 04/16/2022   Constipation 04/16/2022   Obesity (BMI 30-39.9) 04/16/2022   Chest pain of uncertain etiology 04/10/2022   NSTEMI (non-ST elevated myocardial infarction) 04/09/2022   E. coli UTI (urinary tract infection) 04/08/2022   Severe sepsis 04/08/2022   Transient hypotension 04/08/2022   Acute metabolic encephalopathy 04/08/2022   Cellulitis, face 04/08/2022   Elevated troponin 04/08/2022   AKI (acute kidney injury) 04/08/2022   Normocytic anemia 04/08/2022   Thrombocytopenia 04/08/2022   Sepsis 04/08/2022   Lumbosacral radiculopathy at S1 12/06/2020   Hyponatremia 12/06/2020   Mixed hyperlipidemia 12/06/2020   Osteopenia after menopause 12/06/2020   Class 1 obesity due to excess calories in adult 03/29/2020   Prediabetes 03/18/2020   Laryngopharyngeal reflux (LPR) 03/11/2019   Gastroesophageal reflux disease 01/19/2016   Hoarseness, chronic 12/28/2015   Complete left bundle branch block 12/08/2015   Essential hypertension 12/08/2015   Acquired hypothyroidism 12/08/2015   Peripheral vascular disease, unspecified (HCC) 04/18/2013   Chronic venous insufficiency 04/18/2013    Patient's Medications  New Prescriptions   No medications on file  Previous Medications   ACETAMINOPHEN (TYLENOL) 500 MG TABLET    Take 1,000 mg by mouth 3 (three) times daily.   ASPIRIN EC 81 MG TABLET    Take 1 tablet (81 mg total) by mouth daily. Swallow whole.   ATORVASTATIN (LIPITOR) 20 MG TABLET    TAKE 1 TABLET AT BEDTIME   B COMPLEX VITAMINS (VITAMIN-B COMPLEX) TABS    Take 1 tablet by mouth daily.    CHOLECALCIFEROL (VITAMIN D3)  1000 UNITS CAPS    Take 1,000 Units by mouth daily.    CLOBETASOL OINTMENT (TEMOVATE) 0.05 %    Apply 1 Application topically daily as needed (for eczema).   CRANBERRY-VITAMIN C-INULIN (UTI-STAT) LIQD    Take 30 mLs by mouth daily in the afternoon.   CYCLOBENZAPRINE (FLEXERIL) 10 MG TABLET    Take 1 tablet (10 mg total) by mouth 3 (three) times daily as needed for muscle spasms.   GABAPENTIN (NEURONTIN) 100 MG CAPSULE    TAKE 1 CAPSULE(100 MG) BY MOUTH THREE TIMES DAILY   LEVOTHYROXINE (SYNTHROID) 50 MCG TABLET    Take 1 tablet (50 mcg total) by mouth daily before breakfast.   LOSARTAN-HYDROCHLOROTHIAZIDE (HYZAAR) 50-12.5 MG TABLET    TAKE 1 TABLET DAILY   METOPROLOL TARTRATE (LOPRESSOR) 25 MG TABLET    Take 1 tablet (25 mg total) by mouth 2 (two) times daily.   MULTIPLE VITAMINS-MINERALS (PRESERVISION AREDS PO)    Take 1 tablet by mouth 2 (two) times daily.   NITROGLYCERIN (NITROSTAT) 0.4 MG SL TABLET    Place 1 tablet (0.4 mg total) under the tongue every 5 (five) minutes as needed for chest pain.   NYSTATIN (MYCOSTATIN/NYSTOP) POWDER    Apply 1 Application topically daily as needed (for rash/redness).   ONDANSETRON (ZOFRAN) 4 MG TABLET    Take 1 tablet (4 mg total) by mouth every 6 (six) hours as needed for nausea.   OXYCODONE (OXY IR/ROXICODONE) 5 MG IMMEDIATE RELEASE TABLET    Take 1 tablet (5 mg total) by mouth  every 6 (six) hours as needed for severe pain.   PANTOPRAZOLE (PROTONIX) 40 MG TABLET    TAKE 1 TABLET(40 MG) BY MOUTH DAILY   POLYETHYLENE GLYCOL (MIRALAX / GLYCOLAX) 17 G PACKET    Take 17 g by mouth daily.   POLYVINYL ALCOHOL (LIQUIFILM TEARS) 1.4 % OPHTHALMIC SOLUTION    Place 1 drop into both eyes daily as needed for dry eyes.   SENNA-DOCUSATE (SENOKOT-S) 8.6-50 MG TABLET    Take 1 tablet by mouth at bedtime.  Modified Medications   No medications on file  Discontinued Medications   No medications on file    Subjective: Amber [name] is a  82 year old female with past  medical history of hypertension, PAD, left bundle branch block, hypothyroidism, nonobstructive CAD presents as a referral from cardiology.  Patient found to have persistent leukocytosis 11.2 K on 12/14 at cardiology posthospitalization follow-up.  She was admitted to [facility] 11/11 - 11/19 for E. coli bacteremia secondary to UTI.  ID was engaged in the setting of persistent leukocytosis and left hip pain which is chronic she also has a history of bilateral trochanteric bursitis with history of nerve root block and more recently left bursal infection.  On arrival WBC was 23 K temp 100.4.  CT abdomen pelvis showed interstitial changes and fluid around left kidney due to pyelonephritis, nonobstructive ureteral calculi/bladder calculi.  Blood and urine cultures grew E. coli.  She continued leukocytosis 25 K ID was engaged.  Patient was afebrile.  Of note she had bursa injection for bursitis followed by [facility] last time was on 11/9.  Over the next couple days she developed incontinence.  MRI left hip showed tendinosis and partial-thickness insertional tear involving bilateral gluteus medius and gluteus minimus tendons, 3.8 cm right adnexal cyst pelvic ultrasound recommended.  Mild peritrochanteric bursa fluid at the right greater than left.  Orthopedics was engaged and recommended outpatient follow-up.  Pt was placed on abx x 14 days following discharge on 11/19-12/14. She has an upcomming Urology appt. Prior to hospitalization , last UTI was about a year ago.  06/05/22: she denies fevers and chills.   Today 09/06/22: reports she had her right hand caught in the door. One episode of UTI since last visit. Noted vaginal dryness.  Review of Systems: Review of Systems  All other systems reviewed and are negative.   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   Rotator cuff rupture, complete 07/05/2011   Varicose veins     Social History   Tobacco Use   Smoking status: Former    Packs/day: 0.25    Years: 13.00    Additional pack years: 0.00    Total pack years: 3.25    Types: Cigarettes    Quit date: 06/22/1964    Years since quitting: 58.2   Smokeless tobacco: Never  Substance Use Topics   Alcohol use: No   Drug use: No    Family History  Problem Relation Age of Onset   Heart disease Mother    Cancer Mother    Varicose Veins Mother    Deep vein thrombosis Mother    Diabetes Mother  Hypertension Mother    Depression Mother    Obesity Mother    Heart disease Father    AAA (abdominal aortic aneurysm) Father    Varicose Veins Sister    Cancer Sister    Diabetes Sister    Heart disease Sister [id number]   Hypertension Sister    Vision loss Sister    Diabetes Brother    Vision loss Brother    Peripheral vascular disease Brother    AAA (abdominal aortic aneurysm) Brother    Diabetes Sister    Heart disease Sister    Hypertension Sister    Vision loss Sister    Diabetes Sister    Vision loss Sister     Allergies  Allergen Reactions   Codeine Nausea And Vomiting   Darvocet [Propoxyphene N-Acetaminophen] Nausea And Vomiting   Vicodin [Hydrocodone-Acetaminophen] Other (See Comments)    Unknown reactions   Nickel Rash   Sulfa Antibiotics Rash    Like a sunburn    Health Maintenance  Topic Date Due   DTaP/Tdap/Td (1 - Tdap) Never done   Zoster Vaccines- Shingrix (1 of 2) Never done   DEXA SCAN  12/13/2008   COVID-19 Vaccine (3 - Pfizer risk series) 09/02/2019   Medicare Annual Wellness (AWV)  11/09/2022   INFLUENZA VACCINE  12/28/2022   Pneumonia Vaccine 3865+ Years old  Completed   HPV VACCINES  Aged Out    Objective:  Vitals:   09/06/22 1048  BP: 136/74  Pulse: 87  Resp: 16  SpO2: 100%  Weight:  175 lb (79.4 kg)  Height: 5\' 3"  (1.6 m)   Body mass index is 31 kg/m.  Physical Exam Constitutional:      Appearance: Normal appearance.  HENT:     Head: Normocephalic and atraumatic.     Right Ear: Tympanic membrane normal.     Left Ear: Tympanic membrane normal.     Nose: Nose normal.     Mouth/Throat:     Mouth: Mucous membranes are moist.  Eyes:     Extraocular Movements: Extraocular movements intact.     Conjunctiva/sclera: Conjunctivae normal.     Pupils: Pupils are equal, round, and reactive to light.  Cardiovascular:     Rate and Rhythm: Normal rate and regular rhythm.     Heart sounds: No murmur heard.    No friction rub. No gallop.  Pulmonary:     Effort: Pulmonary effort is normal.     Breath sounds: Normal breath sounds.  Abdominal:     General: Abdomen is flat.     Palpations: Abdomen is soft.  Musculoskeletal:        General: Normal range of motion.  Skin:    General: Skin is warm and dry.  Neurological:     General: No focal deficit present.     Mental Status: She is alert and oriented to person, place, and time.  Psychiatric:        Mood and Affect: Mood normal.     Lab Results Lab Results  Component Value Date   WBC 10.4 06/05/2022   HGB 12.2 06/05/2022   HCT 35.6 06/05/2022   MCV 95.4 06/05/2022   PLT 171 06/05/2022    Lab Results  Component Value Date   CREATININE 0.84 06/05/2022   BUN 13 06/05/2022   NA 140 06/05/2022   K 3.5 06/05/2022   CL 100 06/05/2022   CO2 31 06/05/2022    Lab Results  Component Value Date   ALT 10 06/05/2022  AST 13 06/05/2022   ALKPHOS 89 05/11/2022   BILITOT 0.8 06/05/2022    Lab Results  Component Value Date   CHOL 136 03/15/2021   HDL 65.20 03/15/2021   LDLCALC 51 03/15/2021   TRIG 100.0 03/15/2021   CHOLHDL 2 03/15/2021   No results found for: "LABRPR", "RPRTITER" No results found for: "HIV1RNAQUANT", "HIV1RNAVL", "CD4TABS"   Problem List Items Addressed This Visit    None  Assessment/Plan #Leukocytosis resolved - Wbc nl at last visit  #UTI at [facility] #Incontinence #Vaginal dryness -2nd UTI in the last 12 months, which co-encidess with likely vaginal dryness(vaginal skin tears easily).  -Pt had dysuria and treated for UTI at SNF since last visit.  Plan: -Urology follow-up, pt states she had estradiol that she used in the past -Recommend test and treat F/U PRN  #Right hand wound -Caught in door yesterday -No c/f infeciton a this visit  #Adnexal cyst on MRI -U/S pelvic(trans-abdominal showed 4.9 cm simple-appearing right adnexal cyst, almost certainly benign). Consider plevic U/S 1 year Follows with OB   #B/L trochanteric bursitis -F/U with Ortho outpatient(Dr. Charlann Boxer)  -Pt states she is interested in hip replacment(left)     Danelle Earthly, MD [facility] 09/06/2022, 10:55 AM   I have personally spent 75 minutes involved in face-to-face and non-face-to-face activities for this patient on the day of the visit. Professional time spent includes the following activities: Preparing to see the patient (review of tests), Obtaining and/or reviewing separately obtained history (admission/discharge record), Performing a medically appropriate examination and/or evaluation , Ordering medications/tests/procedures, referring and communicating with other health care professionals, Documenting clinical information in the EMR, Independently interpreting results (not separately reported), Communicating results to the patient/family/caregiver, Counseling and educating the patient/family/caregiver and Care coordination (not separately reported).

## 2022-09-18 NOTE — Progress Notes (Deleted)
HPI: Follow-up coronary artery disease.  Carotid Dopplers July 2019 showed 50 to 69% left and less than 50% right stenosis.  Patient admitted November 2023 with bacteremia/hypotension.  Also complained of chest pain and troponin elevated.  Cardiac catheterization November 2023 showed 20% LAD, 30% circumflex and 20 to 30% right coronary artery; elevated LVEDP at 24 mmHg.Marland Kitchen  Echocardiogram November 2023 showed ejection fraction 45 to 50%, grade 2 diastolic dysfunction, mild mitral regurgitation, mild to moderate tricuspid regurgitation.  Patient treated medically.  Abdominal CT November 2023 showed 3.8 cm ovarian cyst on the right and follow-up recommended 6 to 12 months.  Since last seen  Current Outpatient Medications  Medication Sig Dispense Refill   acetaminophen (TYLENOL) 500 MG tablet Take 1,000 mg by mouth 3 (three) times daily.     aspirin EC 81 MG tablet Take 1 tablet (81 mg total) by mouth daily. Swallow whole. 30 tablet 12   atorvastatin (LIPITOR) 20 MG tablet TAKE 1 TABLET AT BEDTIME 90 tablet 3   B Complex Vitamins (VITAMIN-B COMPLEX) TABS Take 1 tablet by mouth daily.      Cholecalciferol (VITAMIN D3) 1000 units CAPS Take 1,000 Units by mouth daily.      clobetasol ointment (TEMOVATE) 0.05 % Apply 1 Application topically daily as needed (for eczema).     Cranberry-Vitamin C-Inulin (UTI-STAT) LIQD Take 30 mLs by mouth daily in the afternoon.     cyclobenzaprine (FLEXERIL) 10 MG tablet Take 1 tablet (10 mg total) by mouth 3 (three) times daily as needed for muscle spasms. 30 tablet 0   gabapentin (NEURONTIN) 100 MG capsule TAKE 1 CAPSULE(100 MG) BY MOUTH THREE TIMES DAILY 90 capsule 1   levothyroxine (SYNTHROID) 50 MCG tablet Take 1 tablet (50 mcg total) by mouth daily before breakfast. 90 tablet 3   losartan-hydrochlorothiazide (HYZAAR) 50-12.5 MG tablet TAKE 1 TABLET DAILY 90 tablet 3   metoprolol tartrate (LOPRESSOR) 25 MG tablet Take 1 tablet (25 mg total) by mouth 2 (two) times  daily. 60 tablet 0   Multiple Vitamins-Minerals (PRESERVISION AREDS PO) Take 1 tablet by mouth 2 (two) times daily.     nitroGLYCERIN (NITROSTAT) 0.4 MG SL tablet Place 1 tablet (0.4 mg total) under the tongue every 5 (five) minutes as needed for chest pain. 30 tablet 12   nystatin (MYCOSTATIN/NYSTOP) powder Apply 1 Application topically daily as needed (for rash/redness).     ondansetron (ZOFRAN) 4 MG tablet Take 1 tablet (4 mg total) by mouth every 6 (six) hours as needed for nausea. 20 tablet 0   oxyCODONE (OXY IR/ROXICODONE) 5 MG immediate release tablet Take 1 tablet (5 mg total) by mouth every 6 (six) hours as needed for severe pain. 10 tablet 0   pantoprazole (PROTONIX) 40 MG tablet TAKE 1 TABLET(40 MG) BY MOUTH DAILY 90 tablet 3   polyethylene glycol (MIRALAX / GLYCOLAX) 17 g packet Take 17 g by mouth daily. 14 each 0   polyvinyl alcohol (LIQUIFILM TEARS) 1.4 % ophthalmic solution Place 1 drop into both eyes daily as needed for dry eyes.     senna-docusate (SENOKOT-S) 8.6-50 MG tablet Take 1 tablet by mouth at bedtime. 30 tablet 0   No current facility-administered medications for this visit.     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   Rotator cuff rupture, complete 07/05/2011   Varicose veins     Past Surgical History:  Procedure Laterality Date   APPENDECTOMY  1979   BACK SURGERY  2009   ENDOVENOUS ABLATION SAPHENOUS VEIN W/ LASER Right 09/15/2013   right greater saphenous vein, by Gretta Began MD   ENDOVENOUS ABLATION SAPHENOUS VEIN W/ LASER Left 10/16/2013   left greater saphenous vein, by Gretta Began MD   LEFT HEART CATH AND CORONARY ANGIOGRAPHY N/A 04/10/2022   Procedure: LEFT HEART CATH AND CORONARY ANGIOGRAPHY;   Surgeon: Lennette Bihari, MD;  Location: [facility];  Service: Cardiovascular;  Laterality: N/A;   left open rotator cuff surgery   2010    lumbar infusion  01/16/2008   PARTIAL HYSTERECTOMY  1979   SHOULDER OPEN ROTATOR CUFF REPAIR  07/05/2011   Procedure: ROTATOR CUFF REPAIR SHOULDER OPEN;  Surgeon: Jacki Cones, MD;  Location: [facility];  Service: Orthopedics;  Laterality: Right;   TOTAL KNEE ARTHROPLASTY Right 09/09/2014   Procedure: TOTAL RIGHT  KNEE ARTHROPLASTY;  Surgeon: Ranee Gosselin, MD;  Location: [facility];  Service: Orthopedics;  Laterality: Right;    Social History   Socioeconomic History   Marital status: Widowed    Spouse name: Not on file   Number of children: Not on file   Years of education: Not on file   Highest education level: Not on file  Occupational History   Occupation: retired  Tobacco Use   Smoking status: Former    Packs/day: 0.25    Years: 13.00    Additional pack years: 0.00    Total pack years: 3.25    Types: Cigarettes    Quit date: 06/22/1964    Years since quitting: 58.2   Smokeless tobacco: Never  Substance and Sexual Activity   Alcohol use: No   Drug use: No   Sexual activity: Never    Birth control/protection: Post-menopausal  Other Topics Concern   Not on file  Social History Narrative   Lives home alone.  Is a widow.  Education: HS grad.  2 children. Caffeine 2.5 cups daily.   Social Determinants of Health   Financial Resource Strain: Low Risk  (11/08/2021)   Overall Financial Resource Strain (CARDIA)    Difficulty of Paying Living Expenses: Not hard at all  Food Insecurity: No Food Insecurity (04/11/2022)   Hunger Vital Sign    Worried About Running Out of Food in the Last Year: Never true    Ran Out of Food in the Last Year: Never true  Transportation Needs: No Transportation Needs (04/11/2022)   PRAPARE - Administrator, Civil Service (Medical): No    Lack of Transportation (Non-Medical): No  Physical  Activity: Inactive (11/08/2021)   Exercise Vital Sign    Days of Exercise per Week: 0 days    Minutes of Exercise per Session: 0 min  Stress: No Stress Concern Present (11/08/2021)   [employer] of Occupational Health - Occupational Stress Questionnaire    Feeling of Stress : Not at all  Social Connections: Moderately Integrated (11/08/2021)   Social Connection and Isolation Panel [NHANES]    Frequency of Communication with Friends and Family: More than three times a week    Frequency of Social Gatherings with Friends and Family: More than three times a week    Attends Religious Services: More  than 4 times per year    Active Member of Clubs or Organizations: Yes    Attends Banker Meetings: 1 to 4 times per year    Marital Status: Widowed  Intimate Partner Violence: Not At Risk (04/11/2022)   Humiliation, Afraid, Rape, and Kick questionnaire    Fear of Current or Ex-Partner: No    Emotionally Abused: No    Physically Abused: No    Sexually Abused: No    Family History  Problem Relation Age of Onset   Heart disease Mother    Cancer Mother    Varicose Veins Mother    Deep vein thrombosis Mother    Diabetes Mother    Hypertension Mother    Depression Mother    Obesity Mother    Heart disease Father    AAA (abdominal aortic aneurysm) Father    Varicose Veins Sister    Cancer Sister    Diabetes Sister    Heart disease Sister 62   Hypertension Sister    Vision loss Sister    Diabetes Brother    Vision loss Brother    Peripheral vascular disease Brother    AAA (abdominal aortic aneurysm) Brother    Diabetes Sister    Heart disease Sister    Hypertension Sister    Vision loss Sister    Diabetes Sister    Vision loss Sister     ROS: no fevers or chills, productive cough, hemoptysis, dysphasia, odynophagia, melena, hematochezia, dysuria, hematuria, rash, seizure activity, orthopnea, PND, pedal edema, claudication. Remaining systems are negative.  Physical  Exam: Well-developed well-nourished in no acute distress.  Skin is warm and dry.  HEENT is normal.  Neck is supple.  Chest is clear to auscultation with normal expansion.  Cardiovascular exam is regular rate and rhythm.  Abdominal exam nontender or distended. No masses palpated. Extremities show no edema. neuro grossly intact  ECG- personally reviewed  A/P  1 coronary artery disease-mild on previous catheterization.  Continue aspirin and statin.  2 history of hypertension-patient's blood pressure is controlled.  Continue present medications.  3 hyperlipidemia-  4 history of ovarian cyst-follow-up primary care.  Olga Millers, MD

## 2022-09-25 ENCOUNTER — Ambulatory Visit: Payer: Medicare Other | Admitting: Cardiology

## 2022-10-29 ENCOUNTER — Other Ambulatory Visit: Payer: Self-pay

## 2022-10-29 ENCOUNTER — Emergency Department (HOSPITAL_BASED_OUTPATIENT_CLINIC_OR_DEPARTMENT_OTHER)
Admission: EM | Admit: 2022-10-29 | Discharge: 2022-10-29 | Disposition: A | Discharge status: Home / Self Care | Payer: Medicare Other | Attending: Emergency Medicine | Admitting: Emergency Medicine

## 2022-10-29 ENCOUNTER — Encounter (HOSPITAL_BASED_OUTPATIENT_CLINIC_OR_DEPARTMENT_OTHER): Payer: Self-pay

## 2022-10-29 ENCOUNTER — Emergency Department (HOSPITAL_BASED_OUTPATIENT_CLINIC_OR_DEPARTMENT_OTHER): Payer: Medicare Other | Admitting: Radiology

## 2022-10-29 DIAGNOSIS — I10 Essential (primary) hypertension: Secondary | ICD-10-CM | POA: Insufficient documentation

## 2022-10-29 DIAGNOSIS — Z7982 Long term (current) use of aspirin: Secondary | ICD-10-CM | POA: Diagnosis not present

## 2022-10-29 DIAGNOSIS — Z79899 Other long term (current) drug therapy: Secondary | ICD-10-CM | POA: Diagnosis not present

## 2022-10-29 DIAGNOSIS — M25552 Pain in left hip: Secondary | ICD-10-CM | POA: Diagnosis present

## 2022-10-29 DIAGNOSIS — G8929 Other chronic pain: Secondary | ICD-10-CM | POA: Diagnosis not present

## 2022-10-29 DIAGNOSIS — E039 Hypothyroidism, unspecified: Secondary | ICD-10-CM | POA: Insufficient documentation

## 2022-10-29 LAB — URINALYSIS, ROUTINE W REFLEX MICROSCOPIC
Bilirubin Urine: NEGATIVE
Glucose, UA: NEGATIVE mg/dL
Hgb urine dipstick: NEGATIVE
Ketones, ur: NEGATIVE mg/dL
Nitrite: NEGATIVE
Protein, ur: NEGATIVE mg/dL
Specific Gravity, Urine: 1.022 (ref 1.005–1.030)
WBC, UA: >50 WBC/hpf (ref 0–5)
pH: 6 (ref 5.0–8.0)

## 2022-10-29 MED ORDER — HYDROMORPHONE HCL 1 MG/ML IJ SOLN
0.5000 mg | Freq: Once | INTRAMUSCULAR | Status: AC
  Administered 2022-10-29: 0.5 mg via INTRAMUSCULAR
  Filled 2022-10-29: qty 1

## 2022-10-29 MED ORDER — LIDOCAINE 5 % EX PTCH
1.0000 | MEDICATED_PATCH | CUTANEOUS | Status: DC
  Administered 2022-10-29: 1 via TRANSDERMAL
  Filled 2022-10-29: qty 1

## 2022-10-29 NOTE — ED Notes (Signed)
Dc instructions reviewed with patient. Patient voiced understanding. Dc with belongings.  °

## 2022-10-29 NOTE — Discharge Instructions (Addendum)
Return to the ED with any new or worsening symptoms such as fevers Please follow-up with your PCP regarding your pain under left hip.  Please continue taking her medications as prescribed. Please follow-up with orthopedic doctor that has been doing her hip injections for further management Please follow-up with physical therapy team at [facility]

## 2022-10-29 NOTE — ED Triage Notes (Signed)
Patient here POV from [facility].  Endorses Left Hip Pain since this AM. History of Chronic Hip Pain but today was worse. No Dysuria. No Known Fevers. Some nausea. No Emesis or Diarrhea. No recent Injury or Trauma.   NAD Noted during Triage. A&Ox4. GCS 15. BIB Wheelchair.

## 2022-10-29 NOTE — ED Provider Notes (Signed)
[place] EMERGENCY DEPARTMENT AT [facility] Provider Note   CSN: [id number] Arrival date & time: 10/29/22  1341     History  Chief Complaint  Patient presents with   Hip Pain    Amber [name] is a 82 y.o. female with history of left bundle branch block, hypertension, hypothyroidism, chronic left hip pain.  Patient presents to the ED for evaluation of left hip pain.  She reports that she woke up this morning with exquisite left-sided hip pain.  She denies any preceding trauma or event to account for this increased pain.  She reports that she has been trying to increase her activity and exercise with her physical therapy team at her SNF because she is trying to not have to use a walker at her granddaughters wedding.  The patient reports that she uses a walker at baseline she has done this for 6 months.  She states that she has a history of chronic left hip pain that is currently being treated with injections by an outpatient orthopedic doctor.  She also states has a history of trochanteric bursitis.  She states that this morning she woke up and took her prescribed oxycodone along with Flexeril, Tylenol and this did slightly alleviate the pain however did not completely alleviate the pain.  She denies any fevers, nausea or vomiting, body aches or chills.  She goes on to tell me that she has been trying to take weight off of her right leg because of pain so she may be aggravated her left hip injury because of this.   Hip Pain       Home Medications Prior to Admission medications   Medication Sig Start Date End Date Taking? Authorizing Provider  acetaminophen (TYLENOL) 500 MG tablet Take 1,000 mg by mouth 3 (three) times daily.    [provider]  aspirin EC 81 MG tablet Take 1 tablet (81 mg total) by mouth daily. Swallow whole. 04/17/22   Marguerita Merles Latif, DO  atorvastatin (LIPITOR) 20 MG tablet TAKE 1 TABLET AT BEDTIME 09/26/21   Willow Ora, MD  B Complex Vitamins  (VITAMIN-B COMPLEX) TABS Take 1 tablet by mouth daily.     [provider]  Cholecalciferol (VITAMIN D3) 1000 units CAPS Take 1,000 Units by mouth daily.     [provider]  clobetasol ointment (TEMOVATE) 0.05 % Apply 1 Application topically daily as needed (for eczema).    [provider]  Cranberry-Vitamin C-Inulin (UTI-STAT) LIQD Take 30 mLs by mouth daily in the afternoon.    [provider]  cyclobenzaprine (FLEXERIL) 10 MG tablet Take 1 tablet (10 mg total) by mouth 3 (three) times daily as needed for muscle spasms. 11/11/20   Willow Ora, MD  gabapentin (NEURONTIN) 100 MG capsule TAKE 1 CAPSULE(100 MG) BY MOUTH THREE TIMES DAILY 02/28/21   Willow Ora, MD  levothyroxine (SYNTHROID) 50 MCG tablet Take 1 tablet (50 mcg total) by mouth daily before breakfast. 11/03/21   Willow Ora, MD  losartan-hydrochlorothiazide [facility]) 50-12.5 MG tablet TAKE 1 TABLET DAILY 03/15/22   Willow Ora, MD  metoprolol tartrate (LOPRESSOR) 25 MG tablet Take 1 tablet (25 mg total) by mouth 2 (two) times daily. 04/16/22   Marguerita Merles Latif, DO  Multiple Vitamins-Minerals (PRESERVISION AREDS PO) Take 1 tablet by mouth 2 (two) times daily.    [provider]  nitroGLYCERIN (NITROSTAT) 0.4 MG SL tablet Place 1 tablet (0.4 mg total) under the tongue every 5 (five)  minutes as needed for chest pain. 04/16/22   Marguerita Merles Latif, DO  nystatin (MYCOSTATIN/NYSTOP) powder Apply 1 Application topically daily as needed (for rash/redness).    [provider]  ondansetron (ZOFRAN) 4 MG tablet Take 1 tablet (4 mg total) by mouth every 6 (six) hours as needed for nausea. 04/16/22   Marguerita Merles Latif, DO  oxyCODONE (OXY IR/ROXICODONE) 5 MG immediate release tablet Take 1 tablet (5 mg total) by mouth every 6 (six) hours as needed for severe pain. 04/16/22   Sheikh, Omair Latif, DO  pantoprazole (PROTONIX) 40 MG tablet TAKE 1 TABLET(40 MG) BY MOUTH DAILY 12/08/21    Willow Ora, MD  polyethylene glycol (MIRALAX / GLYCOLAX) 17 g packet Take 17 g by mouth daily. 04/16/22   Marguerita Merles Latif, DO  polyvinyl alcohol (LIQUIFILM TEARS) 1.4 % ophthalmic solution Place 1 drop into both eyes daily as needed for dry eyes.    [provider]  senna-docusate (SENOKOT-S) 8.6-50 MG tablet Take 1 tablet by mouth at bedtime. 04/16/22   Marguerita Merles Latif, DO      Allergies    Codeine, Darvocet [propoxyphene n-acetaminophen], Vicodin [hydrocodone-acetaminophen], Nickel, and Sulfa antibiotics    Review of Systems   Review of Systems  Constitutional:  Negative for fever.  Musculoskeletal:  Positive for arthralgias.  All other systems reviewed and are negative.   Physical Exam Updated Vital Signs BP (!) 189/97   Pulse 92   Temp (!) 97.3 F (36.3 C)   Resp 16   Ht 5\' 3"  (1.6 m)   Wt 78.9 kg   LMP  (LMP Unknown)   SpO2 94%   BMI 30.82 kg/m  Physical Exam  ED Results / Procedures / Treatments   Labs (all labs ordered are listed, but only abnormal results are displayed) Labs Reviewed  URINALYSIS, ROUTINE W REFLEX MICROSCOPIC - Abnormal; Notable for the following components:      Result Value   Leukocytes,Ua LARGE (*)    Bacteria, UA RARE (*)    Non Squamous Epithelial 0-5 (*)    All other components within normal limits    EKG None  Radiology DG Hip Unilat With Pelvis 2-3 Views Left  Result Date: 10/29/2022 CLINICAL DATA:  Left hip pain EXAM: DG HIP (WITH OR WITHOUT PELVIS) 2-3V LEFT COMPARISON:  Left hip x-ray 04/14/2022 FINDINGS: There is no acute fracture or dislocation. The bones are osteopenic. There are moderate degenerative changes of both hips, unchanged. Lower lumbar fusion hardware is present. Soft tissues are within normal limits. IMPRESSION: 1. No acute fracture or dislocation. 2. Moderate degenerative changes of both hips, unchanged. Electronically Signed   By: Darliss Cheney M.D.   On: 10/29/2022 15:16     Procedures Procedures   Medications Ordered in ED Medications  lidocaine (LIDODERM) 5 % 1 patch (1 patch Transdermal Patch Applied 10/29/22 1725)  HYDROmorphone (DILAUDID) injection 0.5 mg (0.5 mg Intramuscular Given 10/29/22 1719)    ED Course/ Medical Decision Making/ A&P  Medical Decision Making Risk Prescription drug management.   82 year old female presents to the ED with her son for evaluation of left-sided hip pain.  Please see HPI for further details.  On examination the patient is afebrile and nontachycardic.  Her lung sounds are clear bilaterally and she is not hypoxic.  Her abdomen is soft and compressible throughout.  Her left hip has no overlying skin change.  There is no shortening or rotation to her left lower extremity.  She does have full range  of motion actively and passively of her left hip that is nonpainful.  She has 5 out of 5 strength to her left lower extremity.  Plain film imaging of patient left hip ordered in triage shows no acute fracture or dislocation but there are moderate degenerative changes of both hips which are unchanged from prior radiographic studies.  I agree with radiologist interpretation.  Patient was given 0.5 mg of Dilaudid which she states did alleviate much of her hip pain.  The patient will be discharged home at this time and she will follow-up with the physical therapy team at [facility], her SNF, as well as orthopedic doctor doing her hip injections at this time.  She has been advised to return to the ED with any new or worsening signs or symptoms and she voiced understanding.  She had all of her questions answered to her satisfaction.  Her and her son are both stable to discharge him at this time.  Of note, patient blood pressure elevated here.  She reports that she has taken blood pressure medication at home but she attributes her elevated blood pressure to her hip pain that she initially came in with.  She denies chest pain, shortness of  breath, headache or blurred vision.  Final Clinical Impression(s) / ED Diagnoses Final diagnoses:  Chronic left hip pain    Rx / DC Orders ED Discharge Orders     None         Al Decant, PA-C 10/29/22 1815    Melene Plan, DO 10/29/22 1815

## 2022-11-16 NOTE — Progress Notes (Signed)
This encounter was created in error - please disregard.

## 2022-11-16 NOTE — Progress Notes (Signed)
Pt stated she is no longer following Dr Mardelle Matte as PCP and request to be taken off list. Pt is not at a assisted living facility and has new providers.

## 2023-01-18 ENCOUNTER — Other Ambulatory Visit: Payer: Self-pay

## 2023-01-18 ENCOUNTER — Emergency Department (HOSPITAL_COMMUNITY)
Admission: EM | Admit: 2023-01-18 | Discharge: 2023-01-18 | Disposition: A | Discharge status: Skilled Nursing Facility | Payer: Medicare Other | Attending: Emergency Medicine | Admitting: Emergency Medicine

## 2023-01-18 ENCOUNTER — Emergency Department (HOSPITAL_COMMUNITY): Payer: Medicare Other

## 2023-01-18 ENCOUNTER — Other Ambulatory Visit: Payer: Self-pay | Admitting: Cardiology

## 2023-01-18 ENCOUNTER — Telehealth: Payer: Self-pay | Admitting: Cardiology

## 2023-01-18 DIAGNOSIS — Z96651 Presence of right artificial knee joint: Secondary | ICD-10-CM | POA: Diagnosis not present

## 2023-01-18 DIAGNOSIS — I2510 Atherosclerotic heart disease of native coronary artery without angina pectoris: Secondary | ICD-10-CM | POA: Insufficient documentation

## 2023-01-18 DIAGNOSIS — E039 Hypothyroidism, unspecified: Secondary | ICD-10-CM | POA: Diagnosis not present

## 2023-01-18 DIAGNOSIS — Z87891 Personal history of nicotine dependence: Secondary | ICD-10-CM | POA: Insufficient documentation

## 2023-01-18 DIAGNOSIS — I6523 Occlusion and stenosis of bilateral carotid arteries: Secondary | ICD-10-CM

## 2023-01-18 DIAGNOSIS — R072 Precordial pain: Secondary | ICD-10-CM | POA: Diagnosis not present

## 2023-01-18 DIAGNOSIS — R079 Chest pain, unspecified: Secondary | ICD-10-CM | POA: Diagnosis present

## 2023-01-18 DIAGNOSIS — I10 Essential (primary) hypertension: Secondary | ICD-10-CM | POA: Insufficient documentation

## 2023-01-18 DIAGNOSIS — E876 Hypokalemia: Secondary | ICD-10-CM

## 2023-01-18 LAB — COMPREHENSIVE METABOLIC PANEL
ALT: 15 U/L (ref 0–44)
AST: 18 U/L (ref 15–41)
Albumin: 3.5 g/dL (ref 3.5–5.0)
Alkaline Phosphatase: 58 U/L (ref 38–126)
Anion gap: 10 (ref 5–15)
BUN: 9 mg/dL (ref 8–23)
CO2: 28 mmol/L (ref 22–32)
Calcium: 8.9 mg/dL (ref 8.9–10.3)
Chloride: 97 mmol/L — ABNORMAL LOW (ref 98–111)
Creatinine, Ser: 0.74 mg/dL (ref 0.44–1.00)
GFR, Estimated: >60 mL/min (ref >60)
Glucose, Bld: 151 mg/dL — ABNORMAL HIGH (ref 70–99)
Potassium: 2.7 mmol/L — CL (ref 3.5–5.1)
Sodium: 135 mmol/L (ref 135–145)
Total Bilirubin: 1.1 mg/dL (ref 0.3–1.2)
Total Protein: 5.9 g/dL — ABNORMAL LOW (ref 6.5–8.1)

## 2023-01-18 LAB — CBC WITH DIFFERENTIAL/PLATELET
Abs Immature Granulocytes: 0.04 10*3/uL (ref 0.00–0.07)
Basophils Absolute: 0 10*3/uL (ref 0.0–0.1)
Basophils Relative: 0 %
Eosinophils Absolute: 0.1 10*3/uL (ref 0.0–0.5)
Eosinophils Relative: 1 %
HCT: 34.5 % — ABNORMAL LOW (ref 36.0–46.0)
Hemoglobin: 12.1 g/dL (ref 12.0–15.0)
Immature Granulocytes: 0 %
Lymphocytes Relative: 25 %
Lymphs Abs: 2.2 10*3/uL (ref 0.7–4.0)
MCH: 34.1 pg — ABNORMAL HIGH (ref 26.0–34.0)
MCHC: 35.1 g/dL (ref 30.0–36.0)
MCV: 97.2 fL (ref 80.0–100.0)
Monocytes Absolute: 0.6 10*3/uL (ref 0.1–1.0)
Monocytes Relative: 6 %
Neutro Abs: 6.1 10*3/uL (ref 1.7–7.7)
Neutrophils Relative %: 68 %
Platelets: 156 10*3/uL (ref 150–400)
RBC: 3.55 MIL/uL — ABNORMAL LOW (ref 3.87–5.11)
RDW: 14.3 % (ref 11.5–15.5)
WBC: 9.1 10*3/uL (ref 4.0–10.5)
nRBC: 0 % (ref 0.0–0.2)

## 2023-01-18 LAB — TROPONIN I (HIGH SENSITIVITY)
Troponin I (High Sensitivity): 9 ng/L (ref <18)
Troponin I (High Sensitivity): 14 ng/L (ref <18)

## 2023-01-18 LAB — MAGNESIUM: Magnesium: 1.9 mg/dL (ref 1.7–2.4)

## 2023-01-18 MED ORDER — POTASSIUM CHLORIDE 10 MEQ/100ML IV SOLN
10.0000 meq | INTRAVENOUS | Status: AC
  Administered 2023-01-18 (×4): 10 meq via INTRAVENOUS
  Filled 2023-01-18 (×4): qty 100

## 2023-01-18 MED ORDER — POTASSIUM CHLORIDE 20 MEQ PO PACK
40.0000 meq | PACK | Freq: Once | ORAL | Status: AC
  Administered 2023-01-18: 40 meq via ORAL
  Filled 2023-01-18: qty 2

## 2023-01-18 MED ORDER — IOHEXOL 350 MG/ML SOLN
75.0000 mL | Freq: Once | INTRAVENOUS | Status: AC | PRN
  Administered 2023-01-18: 75 mL via INTRAVENOUS

## 2023-01-18 NOTE — Discharge Instructions (Signed)
Follow up with your primary care physician as well as your cardiologist as soon as possible.  Monitor for any signs worsening including increasing chest pain, shortness of breath, or changes in mental status.  Return to the ED for any worsening symptoms or further concerns.

## 2023-01-18 NOTE — Consult Note (Addendum)
Cardiology Consultation   Patient ID: Amber [name] MRN: [id number]; DOB: Oct 27, 1940  Admit date: 01/18/2023 Date of Consult: 01/18/2023  PCP:  [facility] Providers Cardiologist:  Olga Millers, MD   {  Patient Profile:   Amber [name] is a 82 y.o. female with a hx of, mild nonobstructive CAD noted on catheterization in November 2023, DVT, hypertension, hypothyroidism, left bundle branch block, PAD, varicose veins, mild carotid artery stenosis who is being seen 01/18/2023 for the evaluation of chest pain at the request of Dr. Silverio Lay.  History of Present Illness:   Amber [name] has family history of cardiac disease however has not had any significant personal history.  She has an admission in November 2023 in which she was evaluated for septic shock secondary to pyelonephritis and had elevated troponins.  She was taken into the Cath Lab and found to have mild nonobstructive coronary artery disease.  Echocardiogram at the time showed EF borderline low to normal 45 to 50% with no regional wall motion abnormalities, normal RV function.  She was seen thereafter in follow-up without any significant complaints.  Patient nondiabetic, previous-smoker.  Today patient reports coming back from lunch from her assisted living facility and having an abrupt onset of chest pain without any radiation, dizziness, nausea, diaphoresis.  She stated that this chest tightness wrapped around her chest and occurred after sitting down. She started also having severe SOB that persisted and worsened to the point she was unable to speak.  No prior complaints of chest pain.  Prior to this event patient had reported ongoing symptoms of bilateral calf pain, swelling, warmth in her legs.  She does have history of DVT however stated this was during pregnancy.  Patient also admits to having uncontrolled blood pressure and had severely elevated pressures at the time of the event reaching almost  200s systolic.  In the emergency room EKG was obtained that does not show any acute ischemic changes, chronic left bundle branch block.  First troponin negative.  She is borderline tachycardic with heart rates 95-100.  Chest x-ray negative.  Potassium 2.7, however on HCTZ.  This has been supplemented in the ED.  Prior to this event patient did not really have any complaints of chest pains.  No decreased exercise tolerance.  She had rare occurrences of discomfort however nothing like today's event.  Her blood pressure has now normalized and currently about 130s systolic.  She is not tachypneic any longer.  Feels well now.   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   Rotator cuff rupture, complete 07/05/2011   Varicose veins     Past Surgical History:  Procedure Laterality Date   APPENDECTOMY  1979   BACK SURGERY  2009   ENDOVENOUS ABLATION SAPHENOUS VEIN W/ LASER Right 09/15/2013   right greater saphenous vein, by Gretta Began MD   ENDOVENOUS ABLATION SAPHENOUS VEIN W/ LASER Left 10/16/2013   left greater saphenous vein, by Gretta Began MD   LEFT HEART CATH AND CORONARY ANGIOGRAPHY N/A 04/10/2022   Procedure: LEFT HEART CATH AND CORONARY ANGIOGRAPHY;  Surgeon: Lennette Bihari, MD;  Location:  [facility];  Service: Cardiovascular;  Laterality: N/A;   left open rotator cuff surgery   2010    lumbar infusion  01/16/2008   PARTIAL HYSTERECTOMY  1979   SHOULDER OPEN ROTATOR CUFF REPAIR  07/05/2011   Procedure: ROTATOR CUFF REPAIR SHOULDER OPEN;  Surgeon: Jacki Cones, MD;  Location: [facility];  Service: Orthopedics;  Laterality: Right;   TOTAL KNEE ARTHROPLASTY Right 09/09/2014   Procedure: TOTAL RIGHT  KNEE ARTHROPLASTY;   Surgeon: Ranee Gosselin, MD;  Location: [facility];  Service: Orthopedics;  Laterality: Right;     Inpatient Medications: Scheduled Meds:  Continuous Infusions:  potassium chloride 10 mEq (01/18/23 1611)   PRN Meds:   Allergies:    Allergies  Allergen Reactions   Codeine Nausea And Vomiting   Darvocet [Propoxyphene N-Acetaminophen] Nausea And Vomiting   Vicodin [Hydrocodone-Acetaminophen] Other (See Comments)    Unknown reactions   Nickel Rash   Sulfa Antibiotics Rash    Like a sunburn    Social History:   Social History   Socioeconomic History   Marital status: Widowed    Spouse name: Not on file   Number of children: Not on file   Years of education: Not on file   Highest education level: Not on file  Occupational History   Occupation: retired  Tobacco Use   Smoking status: Former    Current packs/day: 0.00    Average packs/day: 0.3 packs/day for 13.0 years (3.3 ttl pk-yrs)    Types: Cigarettes    Start date: 06/23/1951    Quit date: 06/22/1964    Years since quitting: 58.6   Smokeless tobacco: Never  Substance and Sexual Activity   Alcohol use: No   Drug use: No   Sexual activity: Never    Birth control/protection: Post-menopausal  Other Topics Concern   Not on file  Social History Narrative   Lives home alone.  Is a widow.  Education: HS grad.  2 children. Caffeine 2.5 cups daily.   Social Determinants of Health   Financial Resource Strain: Low Risk  (11/08/2021)   Overall Financial Resource Strain (CARDIA)    Difficulty of Paying Living Expenses: Not hard at all  Food Insecurity: No Food Insecurity (04/11/2022)   Hunger Vital Sign    Worried About Running Out of Food in the Last Year: Never true    Ran Out of Food in the Last Year: Never true  Transportation Needs: No Transportation Needs (04/11/2022)   PRAPARE - Administrator, Civil Service (Medical): No    Lack of Transportation (Non-Medical): No  Physical Activity: Inactive (11/08/2021)    Exercise Vital Sign    Days of Exercise per Week: 0 days    Minutes of Exercise per Session: 0 min  Stress: No Stress Concern Present (11/08/2021)   [employer] of Occupational Health - Occupational Stress Questionnaire    Feeling of Stress : Not at all  Social Connections: Moderately Integrated (11/08/2021)   Social Connection and Isolation Panel [NHANES]    Frequency of Communication with Friends and Family: More than three times a week    Frequency of Social Gatherings with Friends and Family: More than three times a week    Attends Religious Services: More than 4 times per year    Active Member of [employer] or Organizations: Yes    Attends Banker Meetings: 1 to 4 times per year    Marital Status: Widowed  Intimate Partner Violence: Not At Risk (04/11/2022)  Humiliation, Afraid, Rape, and Kick questionnaire    Fear of Current or Ex-Partner: No    Emotionally Abused: No    Physically Abused: No    Sexually Abused: No    Family History:   Family History  Problem Relation Age of Onset   Heart disease Mother    Cancer Mother    Varicose Veins Mother    Deep vein thrombosis Mother    Diabetes Mother    Hypertension Mother    Depression Mother    Obesity Mother    Heart disease Father    AAA (abdominal aortic aneurysm) Father    Varicose Veins Sister    Cancer Sister    Diabetes Sister    Heart disease Sister 52   Hypertension Sister    Vision loss Sister    Diabetes Brother    Vision loss Brother    Peripheral vascular disease Brother    AAA (abdominal aortic aneurysm) Brother    Diabetes Sister    Heart disease Sister    Hypertension Sister    Vision loss Sister    Diabetes Sister    Vision loss Sister      ROS:  Please see the history of present illness.  All other ROS reviewed and negative.     Physical Exam/Data:   Vitals:   01/18/23 1402  BP: (!) 172/57  Pulse: 87  Resp: 16  Temp: (!) 97.5 F (36.4 C)  TempSrc: Oral  SpO2: 100%    No intake or output data in the 24 hours ending 01/18/23 1618    10/29/2022    2:23 PM 09/06/2022   10:48 AM 06/05/2022    9:03 AM  Last 3 Weights  Weight (lbs) 174 lb 175 lb 180 lb 6.4 oz  Weight (kg) 78.926 kg 79.379 kg 81.829 kg     There is no height or weight on file to calculate BMI.  General:  Well nourished, well developed, in no acute distress HEENT: normal Neck: no JVD Vascular: No carotid bruits; Distal pulses 2+ bilaterally Cardiac:  normal S1, S2; RRR; + 2/6 murmur  Lungs:  clear to auscultation bilaterally, no wheezing, rhonchi or rales  Abd: soft, no hepatomegaly. TTP  Ext: no edema Musculoskeletal:  No deformities, BUE and BLE strength normal and equal. +homans sign Skin: warm and dry  Neuro:  CNs 2-12 intact, no focal abnormalities noted Psych:  Normal affect   EKG:  The EKG was personally reviewed and demonstrates: Normal sinus rhythm, heart rate 87.  Left bundle branch block.  QTc 529. Telemetry:  Telemetry was personally reviewed and demonstrates: Normal sinus rhythm heart rate 90s  Relevant CV Studies: Left heart catheterization 04/10/2022   Mid LAD lesion is 20% stenosed.   Mid Cx lesion is 30% stenosed.   Mid Cx to Dist Cx lesion is 20% stenosed.   Prox RCA-1 lesion is 30% stenosed.   Prox RCA-2 lesion is 20% stenosed.   Mid RCA lesion is 20% stenosed.   Mild nonobstructive CAD with a percent mid LAD stenosis; 30% AV groove circumflex stenosis; and 20 to 30% RCA stenosis in a dominant vessel.   There is moderate mitral annular calcification.   Elevated LVEDP at 24 mm.   RECOMMENDATION: Aspirin 81 mg.  Medical therapy for CAD.   Limited echocardiogram 04/08/2022 1. Left ventricular ejection fraction, by estimation, is 45 to 50%. The  left ventricle has mildly decreased function. The left ventricle has no  regional wall motion abnormalities. There is  moderate left ventricular  hypertrophy. Left ventricular  diastolic parameters are consistent with  Grade II diastolic dysfunction  (pseudonormalization).   2. Right ventricular systolic function is normal. The right ventricular  size is normal. There is mildly elevated pulmonary artery systolic  pressure.   3. The mitral valve is normal in structure. Mild mitral valve  regurgitation. No evidence of mitral stenosis.   4. The tricuspid valve is abnormal. Tricuspid valve regurgitation is mild  to moderate.   5. The aortic valve is tricuspid. Aortic valve regurgitation is not  visualized. No aortic stenosis is present.   6. The inferior vena cava is dilated in size with >50% respiratory  variability, suggesting right atrial pressure of 8 mmHg.   Comparison(s): No prior Echocardiogram.   Laboratory Data:  High Sensitivity Troponin:   Recent Labs  Lab 01/18/23 1406  TROPONINIHS 9     Chemistry Recent Labs  Lab 01/18/23 1406  NA 135  K 2.7*  CL 97*  CO2 28  GLUCOSE 151*  BUN 9  CREATININE 0.74  CALCIUM 8.9  GFRNONAA >60  ANIONGAP 10    Recent Labs  Lab 01/18/23 1406  PROT 5.9*  ALBUMIN 3.5  AST 18  ALT 15  ALKPHOS 58  BILITOT 1.1   Lipids No results for input(s): "CHOL", "TRIG", "HDL", "LABVLDL", "LDLCALC", "CHOLHDL" in the last 168 hours.  Hematology Recent Labs  Lab 01/18/23 1406  WBC 9.1  RBC 3.55*  HGB 12.1  HCT 34.5*  MCV 97.2  MCH 34.1*  MCHC 35.1  RDW 14.3  PLT 156   Thyroid No results for input(s): "TSH", "FREET4" in the last 168 hours.  BNPNo results for input(s): "BNP", "PROBNP" in the last 168 hours.  DDimer No results for input(s): "DDIMER" in the last 168 hours.   Radiology/Studies:  DG Chest Port 1 View  Result Date: 01/18/2023 CLINICAL DATA:  chest pain EXAM: PORTABLE CHEST 1 VIEW COMPARISON:  04/10/2022. FINDINGS: Bilateral lung fields are clear. Bilateral costophrenic angles are clear. Normal cardio-mediastinal silhouette. Aortic arch calcifications noted. No acute osseous abnormalities. The soft tissues are within normal limits.  IMPRESSION: No acute cardiopulmonary process. Electronically Signed   By: Jules Schick M.D.   On: 01/18/2023 16:12     Assessment and Plan:   Chest pain History of DVTs Nonobstructive CAD Patient with history of mild nonobstructive CAD noted on catheterization in November 2023 presenting with acute onset of chest pain without radiation or related to exertion.  Did have this event in the setting of severely elevated blood pressure in the 200s systolic.  This event was accompanied with significant shortness of breath.  EKG without any acute ischemic changes.  First troponin negative.  She describes other symptoms that seem concerning for PE/DVT.  She reports pain bilaterally in her calf, swelling, borderline tachycardic.  Given abrupt onset and quick resolution, would rule DVT/PE.  If negative, chest pain may be due to severely elevated blood pressure.  Carotid stenosis  She had mild disease bilaterally in 2019.  Stenosis estimated to be about 50% bilaterally.  Carotid Dopplers to be done outpatient.  Order has been placed.  Hypokalemia Continue to supplement.  Discontinue HCTZ and any offending agents.  Hypertension Reported to have had systolics in the 200s.  This has been decreasing since she has been here.  Further titration per primary team.   Risk Assessment/Risk Scores:    For questions or updates, please contact [facility] Please consult [email or web address] for contact info under  Charlynn Grimes, PA-C  01/18/2023 4:18 PM   History and all data above reviewed.  Patient examined.  I agree with the findings as above.  The patient presents from her assisted living facility.  She has a history of mild nonobstructive coronary disease as above.  She was out a lot yesterday with her daughter-in-law who is here also giving history.  Apparently did get a fair amount of walking with that.  Her daughter-in-law thinks she might of overdone it.  Today after eating at her  facility she went back to her room and had a bandlike discomfort around her lower epigastric area and completely around her back.  She has not had this before.  She gets short of breath.  She actually took a walker up to find a med tech who called EMS.  She has chronic left bundle branch block.  Enzymes have been negative.  She had a little bit of this discomfort when I was talking to her and get a little bit short of breath but no distress.  She has not had any new cough fevers or chills.  She has had no new leg swelling but has chronic lower extremity pain and back pain.  She has had a history of DVTs in the past.  She has not been having any PND or orthopnea.  She has had abdominal discomfort and some discomfort with palpation on exam.  The patient exam reveals COR: Regular rate and rhythm, no rubs, no clicks.  2 out of 6 systolic murmur at the right upper sternal border brief, no diastolic,  Lungs: Clear to auscultation bilaterally,  Abd: Positive bowel sounds with some mild tenderness to palpation somewhat reproducing her symptoms, Carotid: Positive bruit on the right Ext 2+ pulses, no edema, no calf tenderness.  All available labs, radiology testing, previous records reviewed. Agree with documented assessment and plan.  Chest pain: This is nonanginal.  There is no objective evidence of ischemia.  She had nonobstructive coronary disease a few months ago on cath.  No further cardiac workup is suggested.  I will defer to the ER and he workup for possible pulmonary embolism, I think this is probably low pretest probability.  D-dimer would potentially help for its negative predictive value.  Bruit: I do note this and see that she had 40 to 59% right plaque in 2019 and I will order carotid Doppler in our office.  Fayrene Fearing Siler Mavis  5:16 PM  01/18/2023

## 2023-01-18 NOTE — Progress Notes (Signed)
Patient currently being evaluated for chest pain.  Bruits heard on physical exam and has known carotid stenosis that has not been followed up since 2019.

## 2023-01-18 NOTE — ED Provider Notes (Signed)
  Physical Exam  BP (!) 172/57   Pulse 87   Temp (!) 97.5 F (36.4 C) (Oral)   Resp 16   LMP  (LMP Unknown)   SpO2 100%   Physical Exam Vitals and nursing note reviewed.  Constitutional:      General: She is not in acute distress.    Appearance: She is well-developed.  HENT:     Head: Normocephalic and atraumatic.  Eyes:     Conjunctiva/sclera: Conjunctivae normal.  Cardiovascular:     Rate and Rhythm: Normal rate and regular rhythm.     Heart sounds: No murmur heard. Pulmonary:     Effort: Pulmonary effort is normal. No respiratory distress.     Comments: Saturating well on room air Musculoskeletal:        General: No swelling.  Skin:    General: Skin is warm and dry.     Capillary Refill: Capillary refill takes less than 2 seconds.  Neurological:     Mental Status: She is alert.     Procedures  Procedures  ED Course / MDM   Clinical Course as of 01/18/23 1525  Thu Jan 18, 2023  1522 Stable. 82yF here w/ chest pain and htn. BP this AM at home in 200s. Chest tightness on exertion. Hx of cath in September. Known LBBB. Getting K+ repletion.  Chest pain improved with nitro - f/u 2nd troponin [CD]    Clinical Course User Index [CD] Rhys Martini, DO   Medical Decision Making Problems Addressed: Chest pain, unspecified type: complicated acute illness or injury Hypokalemia: complicated acute illness or injury  Amount and/or Complexity of Data Reviewed Labs: ordered. Radiology: ordered and independent interpretation performed. ECG/medicine tests: ordered and independent interpretation performed.  Risk Prescription drug management.   Care assumed from Dr. Mcneil Sober at 1500.  Patient initially presented for chest pain and hypertension.  She was found to be hypokalemic.  Initial troponin was negative.  Cardiology was consulted who evaluated the patient has low suspicion for ACS.  Did recommend a CTA of the chest.  CTA chest revealed no evidence of pulmonary  embolism without any other acute abnormalities.  On reevaluation, patient is well-appearing.  She has received her potassium supplementation.  She is not having any chest pain at this time.  Discussed with her the need for close follow-up with cardiology as well as her primary care physician.  She is comfortable with plan for discharge home at this time.  Provided with strict return precautions.  She states understanding and agreement plan for discharge at this time.       Rhys Martini, DO 01/18/23 2239    Charlynne Pander, MD 01/18/23 [phone]

## 2023-01-18 NOTE — ED Provider Notes (Signed)
[place] EMERGENCY DEPARTMENT AT [facility] Provider Note  MDM   HPI/ROS:  Amber [name] is a 82 y.o. female with hypertension, hyperlipidemia, peripheral artery disease, hypothyroidism presenting with chief complaint of hypertension and chest pain.  Patient checked her blood pressure this morning, noting that her systolic pressures were in the 200s.  After eating lunch around noon, she was walking down the hallway and experience crushing substernal chest pain that she described as a tightness.  She denied radiation to her arms or jaws, denies nausea, vomiting, diaphoresis.  When EMS picked her up, she was given 2 doses of sublingual nitro which she feels improved her pain.  Patient denies infectious symptoms and has been in her normal state of health as of recently.  Per chart review, patient has a known left bundle branch block and had a recent admission in the fall 2023 for urosepsis complicated by type II NSTEMI.  She received a cardiac catheterization at this time which demonstrated mild nonobstructive CAD with 30% stenosis at most.  No PCI was performed at this time.  Physical exam is notable for: - Overall well-appearing, no acute distress - Cardiopulmonary exam within normal limits - No abdominal tenderness - Noted lower extremity edema  On my initial evaluation, patient is:  -Vital signs stable. Patient afebrile, hemodynamically stable, and non-toxic appearing. -Additional history obtained from chart review  Given the patient's history and physical exam, differential diagnosis includes but is not limited to ACS, hypertensive emergency, PE, aortic dissection, etc.  Interpretations, interventions, and the patient's course of care are documented below.    Cardiopulmonary workup initiated.  Initial EKG with left bundle branch block but no signs of ischemia per Sgarbossa criteria.  Other labs within normal limits, initial troponin within normal limits.  Cardiology consulted  given high risk chest pain.  Cardiology to evaluate patient at bedside.  Care of this patient was assumed by oncoming team providers at time of signout at approximately 1500.  Plan at time of signout is to follow-up cardiology recommendations, follow-up second troponin, determine disposition.  Disposition:  Handoff  Clinical Impression: No diagnosis found.  Rx / DC Orders ED Discharge Orders     None       The plan for this patient was discussed with Dr. Rhunette Croft, who voiced agreement and who oversaw evaluation and treatment of this patient.   Clinical Complexity A medically appropriate history, review of systems, and physical exam was performed.  My independent interpretations of EKG, labs, and radiology are documented in the ED course above.   Click here for ABCD2, HEART and other calculatorsREFRESH Note before signing   Patient's presentation is most consistent with acute presentation with potential threat to life or bodily function.  Medical Decision Making Amount and/or Complexity of Data Reviewed Labs: ordered. Radiology: ordered.  Risk Prescription drug management.    HPI/ROS      See MDM section for pertinent HPI and ROS. A complete ROS was performed with pertinent positives/negatives noted above.   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   Rotator cuff rupture, complete 07/05/2011   Varicose veins     Past Surgical History:  Procedure Laterality Date   APPENDECTOMY  1979   BACK SURGERY  2009   ENDOVENOUS ABLATION SAPHENOUS VEIN W/ LASER Right 09/15/2013   right greater saphenous vein, by Gretta Began MD   ENDOVENOUS ABLATION SAPHENOUS VEIN W/ LASER Left 10/16/2013   left greater saphenous  vein, by Gretta Began MD   LEFT HEART CATH AND CORONARY ANGIOGRAPHY N/A 04/10/2022   Procedure: LEFT HEART CATH AND CORONARY ANGIOGRAPHY;  Surgeon: Lennette Bihari, MD;  Location: [facility];  Service: Cardiovascular;  Laterality: N/A;   left open rotator cuff surgery   2010    lumbar infusion  01/16/2008   PARTIAL HYSTERECTOMY  1979   SHOULDER OPEN ROTATOR CUFF REPAIR  07/05/2011   Procedure: ROTATOR CUFF REPAIR SHOULDER OPEN;  Surgeon: Jacki Cones, MD;  Location: [facility];  Service: Orthopedics;  Laterality: Right;   TOTAL KNEE ARTHROPLASTY Right 09/09/2014   Procedure: TOTAL RIGHT  KNEE ARTHROPLASTY;  Surgeon: Ranee Gosselin, MD;  Location: [facility];  Service: Orthopedics;  Laterality: Right;      Physical Exam   Vitals:   01/18/23 1402  BP: (!) 172/57  Pulse: 87  Resp: 16  Temp: (!) 97.5 F (36.4 C)  TempSrc: Oral  SpO2: 100%    Physical Exam Vitals and nursing note reviewed.  Constitutional:      General: She is not in acute distress.    Appearance: She is well-developed.  HENT:     Head: Normocephalic and atraumatic.  Eyes:     Conjunctiva/sclera: Conjunctivae normal.  Cardiovascular:     Rate and Rhythm: Normal rate and regular rhythm.     Heart sounds: No murmur heard. Pulmonary:     Effort: Pulmonary effort is normal. No respiratory distress.     Breath sounds: Normal breath sounds.  Abdominal:     Palpations: Abdomen is soft.     Tenderness: There is no abdominal tenderness.  Musculoskeletal:        General: No swelling.     Cervical back: Neck supple.  Skin:    General: Skin is warm and dry.  Neurological:     Mental Status: She is alert.  Psychiatric:        Mood and Affect: Mood normal.     Starleen Arms, MD Department of Emergency Medicine   Please note that this documentation was produced with the assistance of voice-to-text technology and may contain errors.    Dyanne Iha, MD 01/18/23 1612    Derwood Kaplan, MD 01/19/23  [phone]

## 2023-01-18 NOTE — Telephone Encounter (Signed)
Patient being evaluated for chest pain, but bruits were heard on exam. I have placed order for carotid dopplers to be done. Please call patient and help them schedule.

## 2023-01-18 NOTE — ED Triage Notes (Signed)
Patient BIB [facility] from [facility] , hypertensive SBP 200"s, chest pain with SOB 1hr. Lungs clear. LBB, ASA 324mg , x2nitro, 184/88, 18, 96%ra, 20gRAC

## 2023-01-18 NOTE — ED Notes (Signed)
Pt will be d/c to ALF with family members once potassium is finished. Pt remains on telemetry at this time. Pt denies any complaints.

## 2023-01-25 NOTE — Telephone Encounter (Signed)
Hello, I have not seen that this has been scheduled.  Can somebody please update whether or not this patient has been contacted or not?

## 2023-01-30 ENCOUNTER — Encounter (HOSPITAL_COMMUNITY): Payer: Self-pay

## 2023-01-30 ENCOUNTER — Emergency Department (HOSPITAL_COMMUNITY): Payer: Medicare Other

## 2023-01-30 ENCOUNTER — Emergency Department (HOSPITAL_COMMUNITY)
Admission: EM | Admit: 2023-01-30 | Discharge: 2023-01-31 | Discharge status: Against Medical Advice | Payer: Medicare Other | Attending: Emergency Medicine | Admitting: Emergency Medicine

## 2023-01-30 ENCOUNTER — Telehealth: Payer: Self-pay | Admitting: Cardiology

## 2023-01-30 ENCOUNTER — Other Ambulatory Visit: Payer: Self-pay

## 2023-01-30 DIAGNOSIS — R519 Headache, unspecified: Secondary | ICD-10-CM | POA: Diagnosis present

## 2023-01-30 DIAGNOSIS — R0989 Other specified symptoms and signs involving the circulatory and respiratory systems: Secondary | ICD-10-CM

## 2023-01-30 DIAGNOSIS — Z5321 Procedure and treatment not carried out due to patient leaving prior to being seen by health care provider: Secondary | ICD-10-CM | POA: Insufficient documentation

## 2023-01-30 LAB — COMPREHENSIVE METABOLIC PANEL
ALT: 16 U/L (ref 0–44)
AST: 18 U/L (ref 15–41)
Albumin: 4 g/dL (ref 3.5–5.0)
Alkaline Phosphatase: 71 U/L (ref 38–126)
Anion gap: 13 (ref 5–15)
BUN: 9 mg/dL (ref 8–23)
CO2: 26 mmol/L (ref 22–32)
Calcium: 9.2 mg/dL (ref 8.9–10.3)
Chloride: 91 mmol/L — ABNORMAL LOW (ref 98–111)
Creatinine, Ser: 0.78 mg/dL (ref 0.44–1.00)
GFR, Estimated: >60 mL/min (ref >60)
Glucose, Bld: 111 mg/dL — ABNORMAL HIGH (ref 70–99)
Potassium: 3.5 mmol/L (ref 3.5–5.1)
Sodium: 130 mmol/L — ABNORMAL LOW (ref 135–145)
Total Bilirubin: 1.6 mg/dL — ABNORMAL HIGH (ref 0.3–1.2)
Total Protein: 6.6 g/dL (ref 6.5–8.1)

## 2023-01-30 LAB — CBC WITH DIFFERENTIAL/PLATELET
Abs Immature Granulocytes: 0.06 10*3/uL (ref 0.00–0.07)
Basophils Absolute: 0 10*3/uL (ref 0.0–0.1)
Basophils Relative: 0 %
Eosinophils Absolute: 0.1 10*3/uL (ref 0.0–0.5)
Eosinophils Relative: 1 %
HCT: 36 % (ref 36.0–46.0)
Hemoglobin: 12.4 g/dL (ref 12.0–15.0)
Immature Granulocytes: 1 %
Lymphocytes Relative: 20 %
Lymphs Abs: 2.5 10*3/uL (ref 0.7–4.0)
MCH: 32.5 pg (ref 26.0–34.0)
MCHC: 34.4 g/dL (ref 30.0–36.0)
MCV: 94.5 fL (ref 80.0–100.0)
Monocytes Absolute: 1 10*3/uL (ref 0.1–1.0)
Monocytes Relative: 8 %
Neutro Abs: 8.7 10*3/uL — ABNORMAL HIGH (ref 1.7–7.7)
Neutrophils Relative %: 70 %
Platelets: 162 10*3/uL (ref 150–400)
RBC: 3.81 MIL/uL — ABNORMAL LOW (ref 3.87–5.11)
RDW: 14.2 % (ref 11.5–15.5)
WBC: 12.4 10*3/uL — ABNORMAL HIGH (ref 4.0–10.5)
nRBC: 0 % (ref 0.0–0.2)

## 2023-01-30 MED ORDER — IOHEXOL 350 MG/ML SOLN
75.0000 mL | Freq: Once | INTRAVENOUS | Status: AC | PRN
  Administered 2023-01-30: 75 mL via INTRAVENOUS

## 2023-01-30 NOTE — Telephone Encounter (Signed)
Patient needs carotid dopplers scheduled but order that is placed is not allowing me to schedule from it. Can a new order be placed? I'm not sure if the location chosen or the order name is causing this issue but per Yvonna Alanis, PA pt can be scheduled at [facility] but he cannot change the order.

## 2023-01-30 NOTE — ED Triage Notes (Signed)
Pt bib [facility] from [facility]; c/o HA and HTN; recently evaluated for same, had losartan increased x 1 week ago; manual 204/122; head feeling pressure, some dizziness, muscular spasms in neck; no other neuro deficits noted by ems; known 40 and 50% blockage in carotid arteries; HR 80s, EKG LBBB, 98% RA, cbg 150; 18 ga lac

## 2023-01-30 NOTE — ED Provider Triage Note (Signed)
Emergency Medicine Provider Triage Evaluation Note  Amber [name] , a 82 y.o. female  was evaluated in triage.  Pt complains of headache. Headaches since this morning. Had recent carotid US that showed 40 % stenosis.   Review of Systems  Positive: headache Negative: Weakness, trouble speaking   Physical Exam  BP (!) 199/77 (BP Location: Right Arm)   Pulse 82   Temp 98.3 F (36.8 C) (Oral)   Resp 14   Ht 5' 2.5" (1.588 m)   Wt 78.5 kg   LMP  (LMP Unknown)   SpO2 97%   BMI 31.14 kg/m  Gen:   Awake, no distress   Resp:  Normal effort  MSK:   Moves extremities without difficulty  Other:    Medical Decision Making  Medically screening exam initiated at 7:04 PM.  Appropriate orders placed.  Amber [name] was informed that the remainder of the evaluation will be completed by another provider, this initial triage assessment does not replace that evaluation, and the importance of remaining in the ED until their evaluation is complete.  Amber [name] is a 82 y.o. female here with hypertension, headaches. Hypertensive in triage. Nonfocal neuro exam. Ordered labs, CTA head/neck.     Charlynne Pander, MD 01/30/23 [phone]

## 2023-01-30 NOTE — Telephone Encounter (Signed)
Doppler ordered

## 2023-01-31 NOTE — ED Notes (Signed)
Left without being seen.

## 2023-02-14 ENCOUNTER — Ambulatory Visit (HOSPITAL_COMMUNITY)
Admission: RE | Admit: 2023-02-14 | Discharge: 2023-02-14 | Disposition: A | Discharge status: Home / Self Care | Payer: Medicare Other | Source: Ambulatory Visit | Attending: Cardiovascular Disease | Admitting: Cardiovascular Disease

## 2023-02-14 ENCOUNTER — Telehealth: Payer: Self-pay

## 2023-02-14 DIAGNOSIS — R0989 Other specified symptoms and signs involving the circulatory and respiratory systems: Secondary | ICD-10-CM

## 2023-02-14 NOTE — Telephone Encounter (Signed)
Patient in office for cardiac testing.  She states this AM at her ALF she had a BP of systolic greater than 200 with chest heaviness and pressure.  She was advised to go to ED and she refused.  She comes to appt here at office today and states she has chest heaviness over her chest and cannot lie down with out SOB and pressure worsening.  BP at visit is currently 165/78. Spoke with DOD Dr Allyson Sabal , based on her symptoms and history, he advises to call EMS to send her out.  Advised this to patient and she gets teary and does not want to go to ED. Advised with history and current symptoms she needs to be evaluated.  She ask "can't someone just tell me what the cause is"  Advised again that ED will do further testing to R/O cardiac issues and will intervene as needed. Patient refuses EMS transport and advises she will go if she can have her friend that brought her here today, transport.  Dr Allyson Sabal is aware and states this is acceptable .  Spoke with her transport [name] and discussed the concerns for cardiac issues.  She states understanding and states she will take her to ED now.

## 2023-02-22 ENCOUNTER — Telehealth: Payer: Self-pay | Admitting: *Deleted

## 2023-02-22 NOTE — Telephone Encounter (Signed)
LMOM for patient to schedule a new patient appt on 10/7

## 2023-02-23 ENCOUNTER — Encounter: Payer: Self-pay | Admitting: Student

## 2023-02-23 ENCOUNTER — Ambulatory Visit: Payer: Medicare Other | Attending: Nurse Practitioner | Admitting: Student

## 2023-02-23 ENCOUNTER — Encounter: Payer: Self-pay | Admitting: Psychiatry

## 2023-02-23 VITALS — BP 140/54 | HR 80 | Ht 62.0 in | Wt 174.0 lb

## 2023-02-23 DIAGNOSIS — I6523 Occlusion and stenosis of bilateral carotid arteries: Secondary | ICD-10-CM | POA: Insufficient documentation

## 2023-02-23 DIAGNOSIS — I429 Cardiomyopathy, unspecified: Secondary | ICD-10-CM | POA: Insufficient documentation

## 2023-02-23 DIAGNOSIS — I2510 Atherosclerotic heart disease of native coronary artery without angina pectoris: Secondary | ICD-10-CM | POA: Insufficient documentation

## 2023-02-23 DIAGNOSIS — I10 Essential (primary) hypertension: Secondary | ICD-10-CM | POA: Diagnosis not present

## 2023-02-23 DIAGNOSIS — Z01810 Encounter for preprocedural cardiovascular examination: Secondary | ICD-10-CM | POA: Insufficient documentation

## 2023-02-23 NOTE — Telephone Encounter (Signed)
Spoke with the patient regarding the referral to GYN oncology. Patient scheduled as new patient with Dr Alvester Morin on 9/30 at 11:15 am. Patient given an arrival time of 10:45 am.  Explained to the patient the the doctor will perform a pelvic exam at this visit. Patient given the policy that only one visitor allowed and that visitor must be over 16 yrs are allowed in the [facility]. Patient given the address/phone number for the clinic and that the center offers free valet service. Patient aware that masks are option.

## 2023-02-23 NOTE — Telephone Encounter (Signed)
LVM for patient to call office regarding a new patient appointment with Dr. Alvester Morin on Monday 9/30

## 2023-02-23 NOTE — Progress Notes (Signed)
Cardiology Clinic Note   Date: 02/23/2023 ID: Amber [name], DOB 1940-08-27, MRN [id number]  Primary Cardiologist:  Olga Millers, MD  Patient Profile    Amber [name] is a 82 y.o. female who presents to the clinic today for hospital follow up.     Past medical history significant for: Nonobstructive CAD. LHC 04/10/2022: Mid LAD 20%.  Mid LCx 30%.  Mid to distal LCx 20%.  Proximal RCA #1 30%, #2 20%.  Mid RCA 20%.  Moderate MAC.  Elevated LVEDP.  Recommend medical therapy for CAD. Cardiomyopathy. Echo 04/08/2022: EF 45 to 50%.  No RWMA.  Moderate LVH.  Grade II DD.  Normal RV function.  Mildly elevated PA pressure.  Mild MR.  Mild to moderate TR.  Dilated IVC, RA pressure 8 mmHg. PAD. Carotid artery stenosis. Carotid duplex 02/14/2023: Bilateral ICA 1 to 39%.  Right ECA > 50%.  Bilateral subclavian artery flow was disturbed. LBBB. Chronic venous insufficiency. Hypertension. Hyperlipidemia. Prediabetes.     History of Present Illness    Amber [name] is followed by Dr. Jens Som for the above outlined history.  In summary, she was first evaluated by cardiology on 04/10/2022 during hospital admission for septic shock secondary to pyelonephritis.  She had elevated troponin and was taken to the Cath Lab.  LHC showed nonobstructive CAD.  Echo showed EF 45 to 50%, moderate LVH, Grade II DD, normal RV function.  Patient was last seen in the office on 05/11/2022 by Azalee Course, PA-C for hospital follow-up.  She was doing well from a cardiac standpoint and no medication changes were made.  Presented to the ED on 01/18/2023 with complaints of chest pain and shortness of breath.  She was found to be hypertensive with SBP in the 200s.  Troponin negative x 2.  EKG without acute ischemic changes.  Potassium 2.7.  She reported symptoms concerning for PE/DVT with bilateral calf pain, swelling.  CTA was negative for PE.  Patient was discharged back to SNF.  Patient presented again to the ED on 01/30/2023  with complaints of headache and hypertension.  Manual BP 204/122.  CTA head and neck negative for large vessel occlusion or other emergent findings.  Mild to moderate atheromatous change about the carotid bifurcations and carotid siphons without hemodynamically significant stenosis.  Patient left without being seen.  Patient contacted the office on 02/14/2023: "Patient in office for cardiac testing.  She states this AM at her ALF she had a BP of systolic greater than 200 with chest heaviness and pressure.  She was advised to go to ED and she refused.  She comes to appt here at office today and states she has chest heaviness over her chest and cannot lie down with out SOB and pressure worsening.  BP at visit is currently 165/78. Spoke with DOD Dr Allyson Sabal , based on her symptoms and history, he advises to call EMS to send her out.  Advised this to patient and she gets teary and does not want to go to ED. Advised with history and current symptoms she needs to be evaluated.  She ask "can't someone just tell me what the cause is"  Advised again that ED will do further testing to R/O cardiac issues and will intervene as needed. Patient refuses EMS transport and advises she will go if she can have her friend that brought her here today, transport.  Dr Allyson Sabal is aware and states this is acceptable .  Spoke with her transport [name] and  discussed the concerns for cardiac issues.  She states understanding and states she will take her to ED now."   Today, patient is accompanied by her son and daughter in law. She is mostly concerned about her spikes in BP with SBP getting into the 200s. She has had 2 ED visits as described above. She did not go to the ED on 9/18, as her BP came back down. According to her family, she has always had higher BP and it is not unusual for her SBP to be in the 180s. She has been working closely with her PCP at [facility] to regulate her BP. They would like to continue to work with her  on this since she can be followed so closely there. She does reports occasional chest heaviness and the feeling of a band around her chest "Like having a tight bra on when I am not wearing one." Reviewed LHC from November 2023 as well as negative workup while in ED in August. Provided reassurance. Patient reports dyspnea with heavier exertion like prolonged walking but none with routine activities. She has chronic lower extremity edema that has worsened recently secondary to increased sodium intake. She has started using the stationary bike twice a week. She has had a chronic UTI since November and is being followed by urology. She has been on chronic antibiotic with courses of stronger antibiotic as well. She was also recently found to have a rather large ovarian cyst for which she is waiting to be evaluated by GYN. They are not sure if she will need surgery.        ROS: All other systems reviewed and are otherwise negative except as noted in History of Present Illness.  Studies Reviewed    EKG is not ordered today.      Physical Exam    VS:  BP (!) 140/54   Pulse 80   Ht 5\' 2"  (1.575 m)   Wt 174 lb (78.9 kg)   LMP  (LMP Unknown)   SpO2 98%   BMI 31.83 kg/m  , BMI Body mass index is 31.83 kg/m.  GEN: Well nourished, well developed, in no acute distress. Neck: No JVD or carotid bruits. Cardiac:  RRR. No murmurs. No rubs or gallops.   Respiratory:  Respirations regular and unlabored. Clear to auscultation without rales, wheezing or rhonchi. GI: Soft, nontender, nondistended. Extremities: Radials/DP/PT 2+ and equal bilaterally. No clubbing or cyanosis. Mild non-pitting edema bilateral ankles.   Skin: Warm and dry, no rash. Neuro: Strength intact.  Assessment & Plan    Nonobstructive CAD.  Per angiography November 2023. Patient continues to have some episodes of chest heaviness and a feeling like a band around her chest. She does have chronic back pain that her daughter in law feels  could be causing these symptoms. Reviewed the results of LHC from November and negative workup in the ED in August and provided reassurance.  Continue aspirin, metoprolol, as needed SL NTG. Cardiomyopathy.  Echo November 2023 showed EF 45 to 50%, moderate LVH, Grade II DD, normal RV function, mildly elevated PA pressure.  Patient reports dyspnea with heavier exertion but none with routine activities. She has chronic lower extremity edema that recently worsened secondary to increased sodium intake. She has mild nonpitting edema to bilateral ankles. She is otherwise euvolemic and well compensated on exam.  Continue Hyzaar, metoprolol. Hypertension. BP today 140/54.  Patient denies recent headaches, dizziness or vision changes. Will defer further management to PCP at [facility]  per patient and family request secondary to being able to have closer follow up. I think this is reasonable. Continue Hyzaar, metoprolol. Provided recommendations to possibly stop Hyzaar and start olmesartan 20 mg daily with chlorthalidone 12.5 mg daily. Could also try hydralazine 10 mg as needed for SBP >160 but that would be difficult secondary to patient living in assisted living.  Carotid artery disease.  Carotid ultrasound September 2024 showed bilateral ICA 1 to 39%, right ECA >50%.  Patient denies lightheadedness, dizziness, presyncope, syncope, change in vision.  Will continue to monitor as clinically indicated. Preoperative cardiovascular risk assessment. Possible upcoming surgery for ovarian cyst. According to the RCRI, patient has a 0.4% risk of MACE. Patient had recent LHC in November 2023. She is starting to use the stationary bicycle twice a week. Based on ACC/AHA guidelines, Amber [name] would be at acceptable risk for the planned procedure without further cardiovascular testing.   Disposition: Return in 6 months or sooner as needed.          Signed, Etta Grandchild. Darnetta Kesselman, DNP, NP-C

## 2023-02-23 NOTE — Patient Instructions (Signed)
Medication Instructions:  No medication changes *If you need a refill on your cardiac medications before your next appointment, please call your pharmacy*   Lab Work: No labs ordered today If you have labs (blood work) drawn today and your tests are completely normal, you will receive your results only by: MyChart Message (if you have MyChart) OR A paper copy in the mail If you have any lab test that is abnormal or we need to change your treatment, we will call you to review the results.   Testing/Procedures: None   Follow-Up: At [facility], you and your health needs are our priority.  As part of our continuing mission to provide you with exceptional heart care, we have created designated Provider Care Teams.  These Care Teams include your primary Cardiologist (physician) and Advanced Practice Providers (APPs -  Physician Assistants and Nurse Practitioners) who all work together to provide you with the care you need, when you need it.  We recommend signing up for the patient portal called "MyChart".  Sign up information is provided on this After Visit Summary.  MyChart is used to connect with patients for Virtual Visits (Telemedicine).  Patients are able to view lab/test results, encounter notes, upcoming appointments, etc.  Non-urgent messages can be sent to your provider as well.   To learn more about what you can do with MyChart, go to [email or web address].    Your next appointment:   6 month(s)  Provider:   Olga Millers, MD

## 2023-02-26 ENCOUNTER — Inpatient Hospital Stay (HOSPITAL_BASED_OUTPATIENT_CLINIC_OR_DEPARTMENT_OTHER): Payer: Medicare Other | Admitting: Psychiatry

## 2023-02-26 ENCOUNTER — Telehealth: Payer: Self-pay | Admitting: *Deleted

## 2023-02-26 ENCOUNTER — Inpatient Hospital Stay: Payer: Medicare Other | Attending: Gynecologic Oncology

## 2023-02-26 ENCOUNTER — Inpatient Hospital Stay (HOSPITAL_BASED_OUTPATIENT_CLINIC_OR_DEPARTMENT_OTHER): Payer: Medicare Other | Admitting: Gynecologic Oncology

## 2023-02-26 ENCOUNTER — Encounter: Payer: Self-pay | Admitting: Psychiatry

## 2023-02-26 VITALS — BP 162/58 | HR 84 | Temp 97.7°F | Resp 18 | Ht 62.0 in | Wt 176.0 lb

## 2023-02-26 DIAGNOSIS — I10 Essential (primary) hypertension: Secondary | ICD-10-CM | POA: Insufficient documentation

## 2023-02-26 DIAGNOSIS — Z79899 Other long term (current) drug therapy: Secondary | ICD-10-CM | POA: Diagnosis not present

## 2023-02-26 DIAGNOSIS — Z86718 Personal history of other venous thrombosis and embolism: Secondary | ICD-10-CM | POA: Diagnosis not present

## 2023-02-26 DIAGNOSIS — Z6832 Body mass index (BMI) 32.0-32.9, adult: Secondary | ICD-10-CM | POA: Diagnosis not present

## 2023-02-26 DIAGNOSIS — I2510 Atherosclerotic heart disease of native coronary artery without angina pectoris: Secondary | ICD-10-CM | POA: Insufficient documentation

## 2023-02-26 DIAGNOSIS — Z90710 Acquired absence of both cervix and uterus: Secondary | ICD-10-CM | POA: Diagnosis not present

## 2023-02-26 DIAGNOSIS — N9489 Other specified conditions associated with female genital organs and menstrual cycle: Secondary | ICD-10-CM

## 2023-02-26 DIAGNOSIS — Z87891 Personal history of nicotine dependence: Secondary | ICD-10-CM | POA: Diagnosis not present

## 2023-02-26 DIAGNOSIS — M1990 Unspecified osteoarthritis, unspecified site: Secondary | ICD-10-CM | POA: Diagnosis not present

## 2023-02-26 DIAGNOSIS — D398 Neoplasm of uncertain behavior of other specified female genital organs: Secondary | ICD-10-CM

## 2023-02-26 DIAGNOSIS — I739 Peripheral vascular disease, unspecified: Secondary | ICD-10-CM | POA: Diagnosis not present

## 2023-02-26 DIAGNOSIS — R978 Other abnormal tumor markers: Secondary | ICD-10-CM | POA: Diagnosis not present

## 2023-02-26 DIAGNOSIS — R6881 Early satiety: Secondary | ICD-10-CM | POA: Diagnosis not present

## 2023-02-26 DIAGNOSIS — R140 Abdominal distension (gaseous): Secondary | ICD-10-CM | POA: Insufficient documentation

## 2023-02-26 DIAGNOSIS — E039 Hypothyroidism, unspecified: Secondary | ICD-10-CM | POA: Insufficient documentation

## 2023-02-26 DIAGNOSIS — I447 Left bundle-branch block, unspecified: Secondary | ICD-10-CM | POA: Insufficient documentation

## 2023-02-26 DIAGNOSIS — Z79890 Hormone replacement therapy (postmenopausal): Secondary | ICD-10-CM | POA: Diagnosis not present

## 2023-02-26 LAB — CEA (ACCESS): CEA (CHCC): 3.94 ng/mL (ref 0.00–5.00)

## 2023-02-26 NOTE — H&P (View-Only) (Signed)
GYNECOLOGIC ONCOLOGY NEW PATIENT CONSULTATION  Date of Service: 02/26/2023 Referring Provider: Belva Agee, MD   ASSESSMENT AND PLAN: Amber [name] is a 82 y.o. woman with a 7.8 cm complex adnexal mass, growing in size in the past year, elevated HE4.  We reviewed that the exact etiology of the pelvic mass is unclear, but could include a benign, borderline, or malignant process.  Given the change in size, the small area of solid component, and elevated HE4, the recommended treatment is surgical excision to make a definitive diagnosis.  CA125 is normal and this is overall reassuring. Because the mass is relatively small, we feel that a minimally invasive approach is feasible, using robotic assistance.    Recommend preoperative evaluation with CT abdomen/pelvis to ensure no evidence of spread of disease concerning for a more extensive malignant process.  In the event of malignancy or borderline tumor on frozen section, we will perform indicated staging procedures. We discussed that these procedures may include omentectomy pelvic and/or para-aortic lymphadenectomy, peritoneal biopsies. We would also remove any tissue concerning for metastatic disease which could require additional procedures including bowel surgery.  Patient was consented for: Robotic assisted bilateral salpingo-oophorectomy, possible staging, possible mini laparotomy on 03/27/2023.  The risks of surgery were discussed in detail and she understands these to including but not limited to bleeding requiring a blood transfusion, infection, injury to adjacent organs (including but not limited to the bowels, bladder, ureters, nerves, blood vessels), thromboembolic events, wound separation, hernia, possible risk of lymphedema and lymphocyst if lymphadenectomy performed, unforseen complication, possible need for re-exploration, and medical complications such as heart attack, stroke, pneumonia.  If the patient experiences any of these events,  she understands that her hospitalization or recovery may be prolonged and that she may need to take additional medications for a prolonged period. The patient will receive DVT and antibiotic prophylaxis as indicated. She voiced a clear understanding. She had the opportunity to ask questions and informed consent was obtained today. She wishes to proceed.  She will proceed to the lab today for CEA and CA 19-9.  Will obtain cardiac clearance given her history of coronary disease, peripheral vascular disease. Patient does report a history of DVT but this was provoked during pregnancy and has had no recurrent episodes. All preoperative instructions were reviewed. Postoperative expectations were also reviewed. Written handouts were provided to the patient.   A copy of this note was sent to the patient's referring provider.  Clide Cliff, MD Gynecologic Oncology   Medical Decision Making I personally spent  TOTAL 60 minutes face-to-face and non-face-to-face in the care of this patient, which includes all pre, intra, and post visit time on the date of service.  ------------  CC: Pelvic mass  HISTORY OF PRESENT ILLNESS:  Amber [name] is a 82 y.o. woman who is seen in consultation at the request of Belva Agee, MD for evaluation of mass.  Patient presented to her OB/GYN on 02/15/2023 for follow-up of pelvic ultrasound, reassessment of right ovarian cyst.  Previously in 2019, an ultrasound that showed a 2 cm simple cyst.  In November 2023 she underwent an MRI hip which noted a 3.8 cm right adnexal cyst with internal septations.  A pelvic ultrasound at the same time noted a 4.9 cm simple appearing right adnexal cyst, noted to almost certainly be benign, with recommendation for repeat ultrasound in 1 year.  She had the repeat ultrasound on 02/15/2023 which noted a 7.8 x 7.6 cm right cystic mass, mostly simple  with 1.8 cm solid area seen on the wall.  A CA125 was collected and was normal at 8.4.  HE4  was elevated at 115.  Today patient presents with her son.  She reports bloating for about a year, early satiety for the past 4 to 5 months, and a 10 pound weight loss in the past 5 months.  She denies change in bowel or bladder habits.  She reports that she has been followed by urology for concern for UTIs.  In terms of her cardiac history, she was last seen by Carlos Levering, NP on 02/23/23.  Patient underwent an abdominal hysterectomy in her 30s for history of uterine fibroids.  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   Rotator cuff rupture, complete 07/05/2011   Varicose veins     PAST SURGICAL HISTORY: Past Surgical History:  Procedure Laterality Date   APPENDECTOMY  1979   BACK SURGERY  2009   ENDOVENOUS ABLATION SAPHENOUS VEIN W/ LASER Right 09/15/2013   right greater saphenous vein, by Gretta Began MD   ENDOVENOUS ABLATION SAPHENOUS VEIN W/ LASER Left 10/16/2013   left greater saphenous vein, by Gretta Began MD   LEFT HEART CATH AND CORONARY ANGIOGRAPHY N/A 04/10/2022   Procedure: LEFT HEART CATH AND CORONARY ANGIOGRAPHY;  Surgeon: Lennette Bihari, MD;  Location: [facility];  Service: Cardiovascular;  Laterality: N/A;   left open rotator cuff surgery   2010   lumbar infusion  01/16/2008   PARTIAL HYSTERECTOMY  1979   TAH pfannenstiel, fibroids   SHOULDER OPEN ROTATOR CUFF REPAIR  07/05/2011   Procedure: ROTATOR CUFF REPAIR SHOULDER OPEN;  Surgeon: Jacki Cones, MD;  Location: [facility];  Service: Orthopedics;  Laterality: Right;   TOTAL KNEE ARTHROPLASTY Right 09/09/2014   Procedure: TOTAL RIGHT  KNEE ARTHROPLASTY;  Surgeon: Ranee Gosselin, MD;   Location: [facility];  Service: Orthopedics;  Laterality: Right;    OB/GYN HISTORY: OB History  Gravida Para Term Preterm AB Living  2 2 2     2   SAB IAB Ectopic Multiple Live Births          2    # Outcome Date GA Lbr Len/2nd Weight Sex Type Anes PTL Lv  2 Term      Vag-Spont   LIV  1 Term      Vag-Spont   LIV      Age at menarche: 1 Age at menopause: unknown, hyst at age 52 Hx of HRT: estrogen pill for 1 year Hx of STI: no Last pap: 2020 per pt History of abnormal pap smears: no  SCREENING STUDIES:  Last mammogram: 12/2020 Last colonoscopy: 2005  MEDICATIONS:  Current Outpatient Medications:    acetaminophen (TYLENOL) 500 MG tablet, Take 1,000 mg by mouth 3 (three) times daily., Disp: , Rfl:    aspirin EC 81 MG tablet, Take 1 tablet (81 mg total) by mouth daily. Swallow whole., Disp: 30 tablet, Rfl: 12   B Complex Vitamins (VITAMIN-B COMPLEX) TABS, Take 1 tablet by mouth daily. , Disp: , Rfl:    Cholecalciferol (VITAMIN D3) 1000 units CAPS,  Take 1,000 Units by mouth daily. , Disp: , Rfl:    clobetasol ointment (TEMOVATE) 0.05 %, Apply 1 Application topically daily as needed (for eczema)., Disp: , Rfl:    Cranberry-Vitamin C-Inulin (UTI-STAT) LIQD, Take 30 mLs by mouth daily in the afternoon., Disp: , Rfl:    cyclobenzaprine (FLEXERIL) 10 MG tablet, Take 1 tablet (10 mg total) by mouth 3 (three) times daily as needed for muscle spasms., Disp: 30 tablet, Rfl: 0   gabapentin (NEURONTIN) 100 MG capsule, TAKE 1 CAPSULE(100 MG) BY MOUTH THREE TIMES DAILY, Disp: 90 capsule, Rfl: 1   levothyroxine (SYNTHROID) 50 MCG tablet, Take 1 tablet (50 mcg total) by mouth daily before breakfast., Disp: 90 tablet, Rfl: 3   losartan-hydrochlorothiazide (HYZAAR) 50-12.5 MG tablet, TAKE 1 TABLET DAILY, Disp: 90 tablet, Rfl: 3   metoprolol tartrate (LOPRESSOR) 25 MG tablet, Take 1 tablet (25 mg total) by mouth 2 (two) times daily., Disp: 60 tablet, Rfl: 0   Multiple Vitamins-Minerals (PRESERVISION  AREDS PO), Take 1 tablet by mouth 2 (two) times daily., Disp: , Rfl:    nitroGLYCERIN (NITROSTAT) 0.4 MG SL tablet, Place 1 tablet (0.4 mg total) under the tongue every 5 (five) minutes as needed for chest pain., Disp: 30 tablet, Rfl: 12   nystatin (MYCOSTATIN/NYSTOP) powder, Apply 1 Application topically daily as needed (for rash/redness)., Disp: , Rfl:    ondansetron (ZOFRAN) 4 MG tablet, Take 1 tablet (4 mg total) by mouth every 6 (six) hours as needed for nausea., Disp: 20 tablet, Rfl: 0   oxyCODONE (OXY IR/ROXICODONE) 5 MG immediate release tablet, Take 1 tablet (5 mg total) by mouth every 6 (six) hours as needed for severe pain., Disp: 10 tablet, Rfl: 0   polyethylene glycol (MIRALAX / GLYCOLAX) 17 g packet, Take 17 g by mouth daily., Disp: 14 each, Rfl: 0   polyvinyl alcohol (LIQUIFILM TEARS) 1.4 % ophthalmic solution, Place 1 drop into both eyes daily as needed for dry eyes., Disp: , Rfl:    amLODipine (NORVASC) 5 MG tablet, Take 5 mg by mouth daily., Disp: , Rfl:    atorvastatin (LIPITOR) 20 MG tablet, Take 20 mg by mouth daily., Disp: , Rfl:    diclofenac Sodium (VOLTAREN) 1 % GEL, Apply 2 g topically., Disp: , Rfl:    fluconazole (DIFLUCAN) 150 MG tablet, Take 1 pill at the first sign of a yeast infection, may repeat in 48 hours if needed, Disp: , Rfl:    losartan (COZAAR) 100 MG tablet, Take 100 mg by mouth daily., Disp: , Rfl:    nitrofurantoin (MACRODANTIN) 100 MG capsule, Take by mouth daily., Disp: , Rfl:    oxybutynin (DITROPAN-XL) 5 MG 24 hr tablet, , Disp: , Rfl:    pantoprazole (PROTONIX) 20 MG tablet, Take 20 mg by mouth 2 (two) times daily., Disp: , Rfl:    phenazopyridine (PYRIDIUM) 200 MG tablet, , Disp: , Rfl:    Potassium Chloride ER 20 MEQ TBCR, Take 1 tablet by mouth daily., Disp: , Rfl:    RA CRANBERRY 500 MG CAPS, Take 1 capsule by mouth daily., Disp: , Rfl:    REFRESH 1.4-0.6 % SOLN, Apply to eye., Disp: , Rfl:   ALLERGIES: Allergies  Allergen Reactions   Sulfa  Antibiotics Rash    Like a sunburn   Codeine Nausea And Vomiting   Darvocet [Propoxyphene N-Acetaminophen] Nausea And Vomiting   Nickel Rash   Vicodin [Hydrocodone-Acetaminophen] Other (See Comments)    GI upset    FAMILY HISTORY: Family  History  Problem Relation Age of Onset   Heart disease Mother    Esophageal cancer Mother    Varicose Veins Mother    Deep vein thrombosis Mother    Diabetes Mother    Hypertension Mother    Depression Mother    Obesity Mother    Heart disease Father    AAA (abdominal aortic aneurysm) Father    Varicose Veins Sister    Lung cancer Sister    Diabetes Sister    Heart disease Sister 56   Hypertension Sister    Vision loss Sister    Diabetes Sister    Heart disease Sister    Hypertension Sister    Vision loss Sister    Diabetes Sister    Vision loss Sister    Diabetes Brother    Vision loss Brother    Peripheral vascular disease Brother    AAA (abdominal aortic aneurysm) Brother    Breast cancer Neg Hx    Ovarian cancer Neg Hx    Endometrial cancer Neg Hx    Colon cancer Neg Hx     SOCIAL HISTORY: Social History   Socioeconomic History   Marital status: Widowed    Spouse name: Not on file   Number of children: Not on file   Years of education: Not on file   Highest education level: Not on file  Occupational History   Occupation: retired  Tobacco Use   Smoking status: Former    Current packs/day: 0.00    Average packs/day: 0.3 packs/day for 13.0 years (3.3 ttl pk-yrs)    Types: Cigarettes    Start date: 06/23/1951    Quit date: 06/22/1964    Years since quitting: 58.7   Smokeless tobacco: Never  Substance and Sexual Activity   Alcohol use: No   Drug use: No   Sexual activity: Never    Birth control/protection: Post-menopausal  Other Topics Concern   Not on file  Social History Narrative   Lives home alone.  Is a widow.  Education: HS grad.  2 children. Caffeine 2.5 cups daily.   Social Determinants of Health    Financial Resource Strain: Low Risk  (11/08/2021)   Overall Financial Resource Strain (CARDIA)    Difficulty of Paying Living Expenses: Not hard at all  Food Insecurity: No Food Insecurity (04/11/2022)   Hunger Vital Sign    Worried About Running Out of Food in the Last Year: Never true    Ran Out of Food in the Last Year: Never true  Transportation Needs: No Transportation Needs (04/11/2022)   PRAPARE - Administrator, Civil Service (Medical): No    Lack of Transportation (Non-Medical): No  Physical Activity: Inactive (11/08/2021)   Exercise Vital Sign    Days of Exercise per Week: 0 days    Minutes of Exercise per Session: 0 min  Stress: No Stress Concern Present (11/08/2021)   [employer] of Occupational Health - Occupational Stress Questionnaire    Feeling of Stress : Not at all  Social Connections: Moderately Integrated (11/08/2021)   Social Connection and Isolation Panel [NHANES]    Frequency of Communication with Friends and Family: More than three times a week    Frequency of Social Gatherings with Friends and Family: More than three times a week    Attends Religious Services: More than 4 times per year    Active Member of [employer] or Organizations: Yes    Attends Banker Meetings: 1 to 4 times per  year    Marital Status: Widowed  Intimate Partner Violence: Not At Risk (04/11/2022)   Humiliation, Afraid, Rape, and Kick questionnaire    Fear of Current or Ex-Partner: No    Emotionally Abused: No    Physically Abused: No    Sexually Abused: No    REVIEW OF SYSTEMS: New patient intake form was reviewed.  Complete 10-system review is negative except for the following: urinary frequency, pelvic pain, leg swelling, problem with walking (walker)  PHYSICAL EXAM: BP (!) 162/58 (BP Location: Left Arm, Patient Position: Sitting) Comment: second bp taken.  Pulse 84   Temp 97.7 F (36.5 C) (Oral)   Resp 18   Ht 5\' 2"  (1.575 m)   Wt 176 lb (79.8 kg)    LMP  (LMP Unknown)   SpO2 98%   BMI 32.19 kg/m  Constitutional: No acute distress. Neuro/Psych: Alert, oriented.  Head and Neck: Normocephalic, atraumatic. Neck symmetric without masses. Sclera anicteric.  Respiratory: Normal work of breathing. Clear to auscultation bilaterally. Cardiovascular: Regular rate and rhythm, no murmurs, rubs, or gallops. Abdomen: Normoactive bowel sounds. Soft, non-distended, non-tender to palpation. No masses appreciated. Well healed pfannenstiel incision. Extremities: Grossly normal range of motion. Warm, well perfused. Skin: No rashes or lesions. Lymphatic: No cervical, supraclavicular, or inguinal adenopathy. Genitourinary: External genitalia without lesions. Urethral meatus without lesions or prolapse. On speculum exam, vagina without lesions. With valsalva, apical prolapse to the introitus. Bimanual exam reveals smooth vaginal mucosa and cuff. Cystic pelvic mass approximately 7cm, right sided. Exam chaperoned by Warner Mccreedy, NP   LABORATORY AND RADIOLOGIC DATA: Outside medical records were reviewed to synthesize the above history, along with the history and physical obtained during the visit.  Outside laboratory, and imaging reports were reviewed, with pertinent results below.  I personally reviewed the outside images.  WBC  Date Value Ref Range Status  01/30/2023 12.4 (H) 4.0 - 10.5 K/uL Final   Hemoglobin  Date Value Ref Range Status  01/30/2023 12.4 12.0 - 15.0 g/dL Final  96/08/5407 81.1 (L) 11.1 - 15.9 g/dL Final   HCT  Date Value Ref Range Status  01/30/2023 36.0 36.0 - 46.0 % Final   Hematocrit  Date Value Ref Range Status  05/11/2022 32.8 (L) 34.0 - 46.6 % Final   Platelets  Date Value Ref Range Status  01/30/2023 162 150 - 400 K/uL Final  05/11/2022 276 150 - 450 x10E3/uL Final   Magnesium  Date Value Ref Range Status  01/18/2023 1.9 1.7 - 2.4 mg/dL Final    Comment:    Performed at [facility], [address],  [place], [place] [address]   Creat  Date Value Ref Range Status  06/05/2022 0.84 0.60 - 0.95 mg/dL Final   Creatinine, Ser  Date Value Ref Range Status  01/30/2023 0.78 0.44 - 1.00 mg/dL Final   AST  Date Value Ref Range Status  01/30/2023 18 15 - 41 U/L Final   ALT  Date Value Ref Range Status  01/30/2023 16 0 - 44 U/L Final   Labs (02/15/23) CA125: 8.4 HE4: 115  Pelvic ultrasound (02/15/23) Transabdominal pelvic ultrasound performed Hyst Right adnexa 7.8 x 7.6 cm cystic mass (mostly simple with 1.8 cm solid area seen in wall) seen. No normal ovarian tissue seen. ?  Ovarian cyst versus adnexal cyst.  No blood flow seen within cystic mass. Left ovary was not visualized.  No left adnexal masses seen. No free fluid seen. Distended colon seen  US PELVIS (TRANSABDOMINAL ONLY)  04/15/2022  Narrative CLINICAL DATA:  Right adnexal cyst noted on prior CT dated 04/08/2022  EXAM: TRANSABDOMINAL ULTRASOUND OF PELVIS  TECHNIQUE: Transabdominal ultrasound examination of the pelvis was performed including evaluation of the uterus, ovaries, adnexal regions, and pelvic cul-de-sac.  COMPARISON:  CT abdomen and pelvis dated 04/08/2022  FINDINGS: Uterus  Surgically absent.  Right ovary  Right adnexal simple-appearing cyst measures 4.9 x 4.0 x 3.0 cm. The right ovary is not seen.  Left ovary  Left ovary is not seen.  Other findings:  No abnormal free fluid.  IMPRESSION: The ovaries are not seen. 4.9 cm simple-appearing right adnexal cyst, almost certainly benign. Consider follow-up ultrasound pelvis in 1 year to assess stability, as clinically indicated.   Electronically Signed By: Agustin Cree M.D. On: 04/15/2022 13:51   MR HIP LEFT W WO CONTRAST 04/14/2022  Narrative CLINICAL DATA:  Left hip pain after fall  EXAM: MRI OF THE LEFT HIP WITHOUT AND WITH CONTRAST  TECHNIQUE: Multiplanar, multisequence MR imaging was performed both before and after administration of  intravenous contrast.  CONTRAST:  7.44mL GADAVIST GADOBUTROL 1 MMOL/ML IV SOLN  COMPARISON:  X-ray 04/14/2022, CT 04/08/2022  FINDINGS: Bones: No acute fracture. No dislocation. No femoral head avascular necrosis. Bony pelvis intact without diastasis. SI joints and pubic symphysis within normal limits. Partially imaged lumbar fusion hardware with associated susceptibility artifact. There is degenerative disc disease at the L5-S1 level, not well assessed on the included imaging planes. No extra-articular sites of bone marrow edema. No marrow replacing bone lesion.  Articular cartilage and labrum  Articular cartilage: Moderate osteoarthritis of the left hip with diffuse cartilage thinning and surface irregularity. No subchondral marrow signal changes.  Labrum:  Superior labral degeneration.  No paralabral cyst.  Joint or bursal effusion  Joint effusion:  None.  Bursae: Small amount of peritrochanteric bursal fluid, right greater than left.  Muscles and tendons  Muscles and tendons: Tendinosis with partial-thickness insertional tears involving the bilateral gluteus medius and gluteus minimus tendons. Mild tendinosis of the bilateral hamstring tendon origins. The iliopsoas, rectus femoris, and adductor tendons appear intact without tear or significant tendinosis. Mild generalized intramuscular edema. No intramuscular fluid collection. Preserved muscle bulk atrophy or fatty infiltration.  Other findings  Miscellaneous: Mild diffuse subcutaneous edema. No organized fluid collection or hematoma. No inguinal lymphadenopathy. 3.8 cm right adnexal cyst with internal septations.  IMPRESSION: 1. No acute osseous abnormality of the left hip or pelvis. 2. Moderate osteoarthritis of the left hip. 3. Tendinosis with partial-thickness insertional tears involving the bilateral gluteus medius and gluteus minimus tendons. 4. Mild tendinosis of the bilateral hamstring tendon origins. 5.  Mild peritrochanteric bursal fluid, right greater than left. 6. Mildly complex 3.8 cm right adnexal cyst. Further assessment with pelvic ultrasound is recommended.   Electronically Signed By: Duanne Guess D.O. On: 04/15/2022 09:46

## 2023-02-26 NOTE — Patient Instructions (Addendum)
Plan on having a CT scan before surgery. You will be contacted with the results. For the CT, nothing to eat or drink 4 hours before your scan. You will need to arrive 2 hours before your scan to begin drinking oral contrast.  Preparing for your Surgery  Plan for surgery on March 27, 2023 with Dr. Clide Cliff at [facility]. You will be scheduled for robotic assisted laparoscopic bilateral salpingo-oophorectomy (removal of the ovaries and fallopian tubes), possible staging if a precancer or cancer is seen, possible mini laparotomy (slightly larger incision on your abdomen if needed).   We will plan for an outpatient surgery meaning you can go home the day of surgery if you are doing well after.  Pre-operative Testing -You will receive a phone call from presurgical testing at [facility] to arrange for a pre-operative appointment and lab work.  -Bring your insurance card, copy of an advanced directive if applicable, medication list  -At that visit, you will be asked to sign a consent for a possible blood transfusion in case a transfusion becomes necessary during surgery.  The need for a blood transfusion is rare but having consent is a necessary part of your care.     -You should not be taking blood thinners or aspirin at least ten days prior to surgery unless instructed by your surgeon.  -Do not take supplements such as fish oil (omega 3), red yeast rice, turmeric before your surgery. You want to avoid medications with aspirin in them including headache powders such as BC or Goody's), Excedrin migraine.  Day Before Surgery at Home -You will be asked to take in a light diet the day before surgery. You will be advised you can have clear liquids up until 3 hours before your surgery.    Eat a light diet the day before surgery.  Examples including soups, broths, toast, yogurt, mashed potatoes.  AVOID GAS PRODUCING FOODS AND BEVERAGES. Things to avoid include carbonated  beverages (fizzy beverages, sodas), raw fruits and raw vegetables (uncooked), or beans.   If your bowels are filled with gas, your surgeon will have difficulty visualizing your pelvic organs which increases your surgical risks.  Your role in recovery Your role is to become active as soon as directed by your doctor, while still giving yourself time to heal.  Rest when you feel tired. You will be asked to do the following in order to speed your recovery:  - Cough and breathe deeply. This helps to clear and expand your lungs and can prevent pneumonia after surgery.  - STAY ACTIVE WHEN YOU GET HOME. Do mild physical activity. Walking or moving your legs help your circulation and body functions return to normal. Do not try to get up or walk alone the first time after surgery.   -If you develop swelling on one leg or the other, pain in the back of your leg, redness/warmth in one of your legs, please call the office or go to the Emergency Room to have a doppler to rule out a blood clot. For shortness of breath, chest pain-seek care in the Emergency Room as soon as possible. - Actively manage your pain. Managing your pain lets you move in comfort. We will ask you to rate your pain on a scale of zero to 10. It is your responsibility to tell your doctor or nurse where and how much you hurt so your pain can be treated.  Special Considerations -If you are diabetic, you may be placed  on insulin after surgery to have closer control over your blood sugars to promote healing and recovery.  This does not mean that you will be discharged on insulin.  If applicable, your oral antidiabetics will be resumed when you are tolerating a solid diet.  -Your final pathology results from surgery should be available around one week after surgery and the results will be relayed to you when available.  -Dr. Antionette Char is the surgeon that assists your GYN Oncologist with surgery.  If you end up staying the night, the next  day after your surgery you will either see Dr. Pricilla Holm, Dr. Alvester Morin, or Dr. Antionette Char.  -FMLA forms can be faxed to [phone] and please allow 5-7 business days for completion.  Pain Management After Surgery -You can continue taking your oxycodone for pain after surgery.   -Make sure that you have Tylenol IF YOU ARE ABLE TO TAKE THESE MEDICATION at home to use on a regular basis after surgery for pain control.   -Review the attached handout on narcotic use and their risks and side effects.   Bowel Regimen -You will be prescribed Sennakot-S to take nightly to prevent constipation especially if you are taking the narcotic pain medication intermittently.  It is important to prevent constipation and drink adequate amounts of liquids. You can stop taking this medication when you are not taking pain medication and you are back on your normal bowel routine.  Risks of Surgery Risks of surgery are low but include bleeding, infection, damage to surrounding structures, re-operation, blood clots, and very rarely death.   Blood Transfusion Information (For the consent to be signed before surgery)  We will be checking your blood type before surgery so in case of emergencies, we will know what type of blood you would need.                                            WHAT IS A BLOOD TRANSFUSION?  A transfusion is the replacement of blood or some of its parts. Blood is made up of multiple cells which provide different functions. Red blood cells carry oxygen and are used for blood loss replacement. White blood cells fight against infection. Platelets control bleeding. Plasma helps clot blood. Other blood products are available for specialized needs, such as hemophilia or other clotting disorders. BEFORE THE TRANSFUSION  Who gives blood for transfusions?  You may be able to donate blood to be used at a later date on yourself (autologous donation). Relatives can be asked to donate blood. This  is generally not any safer than if you have received blood from a stranger. The same precautions are taken to ensure safety when a relative's blood is donated. Healthy volunteers who are fully evaluated to make sure their blood is safe. This is blood bank blood. Transfusion therapy is the safest it has ever been in the practice of medicine. Before blood is taken from a donor, a complete history is taken to make sure that person has no history of diseases nor engages in risky social behavior (examples are intravenous drug use or sexual activity with multiple partners). The donor's travel history is screened to minimize risk of transmitting infections, such as malaria. The donated blood is tested for signs of infectious diseases, such as HIV and hepatitis. The blood is then tested to be sure it is compatible with you in  order to minimize the chance of a transfusion reaction. If you or a relative donates blood, this is often done in anticipation of surgery and is not appropriate for emergency situations. It takes many days to process the donated blood. RISKS AND COMPLICATIONS Although transfusion therapy is very safe and saves many lives, the main dangers of transfusion include:  Getting an infectious disease. Developing a transfusion reaction. This is an allergic reaction to something in the blood you were given. Every precaution is taken to prevent this. The decision to have a blood transfusion has been considered carefully by your caregiver before blood is given. Blood is not given unless the benefits outweigh the risks.  AFTER SURGERY INSTRUCTIONS  Return to work: 4-6 weeks if applicable  Activity: 1. Be up and out of the bed during the day.  Take a nap if needed.  You may walk up steps but be careful and use the hand rail.  Stair climbing will tire you more than you think, you may need to stop part way and rest.   2. No lifting or straining for 6 weeks over 10 pounds. No pushing, pulling, straining  for 6 weeks.  3. No driving for around 1 week(s) if you were cleared to drive before surgery.  Do not drive if you are taking narcotic pain medicine and make sure that your reaction time has returned.   4. You can shower as soon as the next day after surgery. Shower daily.  Use your regular soap and water (not directly on the incision) and pat your incision(s) dry afterwards; don't rub.  No tub baths or submerging your body in water until cleared by your surgeon. If you have the soap that was given to you by pre-surgical testing that was used before surgery, you do not need to use it afterwards because this can irritate your incisions.   5. No sexual activity and nothing in the vagina for 12 weeks.  6. You may experience a small amount of clear drainage from your incisions, which is normal.  If the drainage persists, increases, or changes color please call the office.  7. Do not use creams, lotions, or ointments such as neosporin on your incisions after surgery until advised by your surgeon because they can cause removal of the dermabond glue on your incisions.    8. Take Tylenol first for pain if you are able to take these medication and only use narcotic pain medication for severe pain not relieved by the Tylenol.  Monitor your Tylenol intake to a max of 4,000 mg in a 24 hour period.   Diet: 1. Low sodium Heart Healthy Diet is recommended but you are cleared to resume your normal (before surgery) diet after your procedure.  2. It is safe to use a laxative, such as Miralax or Colace, if you have difficulty moving your bowels. You have been prescribed Sennakot-S to take at bedtime every evening after surgery to keep bowel movements regular and to prevent constipation.    Wound Care: 1. Keep clean and dry.  Shower daily.  Reasons to call the Doctor: Fever - Oral temperature greater than 100.4 degrees Fahrenheit Foul-smelling vaginal discharge Difficulty urinating Nausea and  vomiting Increased pain at the site of the incision that is unrelieved with pain medicine. Difficulty breathing with or without chest pain New calf pain especially if only on one side Sudden, continuing increased vaginal bleeding with or without clots.   Contacts: For questions or concerns you should contact:  Dr. Clide Cliff at [phone]  Warner Mccreedy, NP at [phone]  After Hours: call [phone] and have the GYN Oncologist paged/contacted (after 5 pm or on the weekends). You will speak with an after hours RN and let he or she know you have had surgery.  Messages sent via mychart are for non-urgent matters and are not responded to after hours so for urgent needs, please call the after hours number.

## 2023-02-26 NOTE — Progress Notes (Signed)
GYNECOLOGIC ONCOLOGY NEW PATIENT CONSULTATION  Date of Service: 02/26/2023 Referring Provider: Belva Agee, MD   ASSESSMENT AND PLAN: Amber [name] is a 82 y.o. woman with a 7.8 cm complex adnexal mass, growing in size in the past year, elevated HE4.  We reviewed that the exact etiology of the pelvic mass is unclear, but could include a benign, borderline, or malignant process.  Given the change in size, the small area of solid component, and elevated HE4, the recommended treatment is surgical excision to make a definitive diagnosis.  CA125 is normal and this is overall reassuring. Because the mass is relatively small, we feel that a minimally invasive approach is feasible, using robotic assistance.    Recommend preoperative evaluation with CT abdomen/pelvis to ensure no evidence of spread of disease concerning for a more extensive malignant process.  In the event of malignancy or borderline tumor on frozen section, we will perform indicated staging procedures. We discussed that these procedures may include omentectomy pelvic and/or para-aortic lymphadenectomy, peritoneal biopsies. We would also remove any tissue concerning for metastatic disease which could require additional procedures including bowel surgery.  Patient was consented for: Robotic assisted bilateral salpingo-oophorectomy, possible staging, possible mini laparotomy on 03/27/2023.  The risks of surgery were discussed in detail and she understands these to including but not limited to bleeding requiring a blood transfusion, infection, injury to adjacent organs (including but not limited to the bowels, bladder, ureters, nerves, blood vessels), thromboembolic events, wound separation, hernia, possible risk of lymphedema and lymphocyst if lymphadenectomy performed, unforseen complication, possible need for re-exploration, and medical complications such as heart attack, stroke, pneumonia.  If the patient experiences any of these events,  she understands that her hospitalization or recovery may be prolonged and that she may need to take additional medications for a prolonged period. The patient will receive DVT and antibiotic prophylaxis as indicated. She voiced a clear understanding. She had the opportunity to ask questions and informed consent was obtained today. She wishes to proceed.  She will proceed to the lab today for CEA and CA 19-9.  Will obtain cardiac clearance given her history of coronary disease, peripheral vascular disease. Patient does report a history of DVT but this was provoked during pregnancy and has had no recurrent episodes. All preoperative instructions were reviewed. Postoperative expectations were also reviewed. Written handouts were provided to the patient.   A copy of this note was sent to the patient's referring provider.  Clide Cliff, MD Gynecologic Oncology   Medical Decision Making I personally spent  TOTAL 60 minutes face-to-face and non-face-to-face in the care of this patient, which includes all pre, intra, and post visit time on the date of service.  ------------  CC: Pelvic mass  HISTORY OF PRESENT ILLNESS:  Amber [name] is a 82 y.o. woman who is seen in consultation at the request of Belva Agee, MD for evaluation of mass.  Patient presented to her OB/GYN on 02/15/2023 for follow-up of pelvic ultrasound, reassessment of right ovarian cyst.  Previously in 2019, an ultrasound that showed a 2 cm simple cyst.  In November 2023 she underwent an MRI hip which noted a 3.8 cm right adnexal cyst with internal septations.  A pelvic ultrasound at the same time noted a 4.9 cm simple appearing right adnexal cyst, noted to almost certainly be benign, with recommendation for repeat ultrasound in 1 year.  She had the repeat ultrasound on 02/15/2023 which noted a 7.8 x 7.6 cm right cystic mass, mostly simple  with 1.8 cm solid area seen on the wall.  A CA125 was collected and was normal at 8.4.  HE4  was elevated at 115.  Today patient presents with her son.  She reports bloating for about a year, early satiety for the past 4 to 5 months, and a 10 pound weight loss in the past 5 months.  She denies change in bowel or bladder habits.  She reports that she has been followed by urology for concern for UTIs.  In terms of her cardiac history, she was last seen by Carlos Levering, NP on 02/23/23.  Patient underwent an abdominal hysterectomy in her 30s for history of uterine fibroids.  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   Rotator cuff rupture, complete 07/05/2011   Varicose veins     PAST SURGICAL HISTORY: Past Surgical History:  Procedure Laterality Date   APPENDECTOMY  1979   BACK SURGERY  2009   ENDOVENOUS ABLATION SAPHENOUS VEIN W/ LASER Right 09/15/2013   right greater saphenous vein, by Gretta Began MD   ENDOVENOUS ABLATION SAPHENOUS VEIN W/ LASER Left 10/16/2013   left greater saphenous vein, by Gretta Began MD   LEFT HEART CATH AND CORONARY ANGIOGRAPHY N/A 04/10/2022   Procedure: LEFT HEART CATH AND CORONARY ANGIOGRAPHY;  Surgeon: Lennette Bihari, MD;  Location: [facility];  Service: Cardiovascular;  Laterality: N/A;   left open rotator cuff surgery   2010   lumbar infusion  01/16/2008   PARTIAL HYSTERECTOMY  1979   TAH pfannenstiel, fibroids   SHOULDER OPEN ROTATOR CUFF REPAIR  07/05/2011   Procedure: ROTATOR CUFF REPAIR SHOULDER OPEN;  Surgeon: Jacki Cones, MD;  Location: [facility];  Service: Orthopedics;  Laterality: Right;   TOTAL KNEE ARTHROPLASTY Right 09/09/2014   Procedure: TOTAL RIGHT  KNEE ARTHROPLASTY;  Surgeon: Ranee Gosselin, MD;   Location: [facility];  Service: Orthopedics;  Laterality: Right;    OB/GYN HISTORY: OB History  Gravida Para Term Preterm AB Living  2 2 2     2   SAB IAB Ectopic Multiple Live Births          2    # Outcome Date GA Lbr Len/2nd Weight Sex Type Anes PTL Lv  2 Term      Vag-Spont   LIV  1 Term      Vag-Spont   LIV      Age at menarche: 1 Age at menopause: unknown, hyst at age 52 Hx of HRT: estrogen pill for 1 year Hx of STI: no Last pap: 2020 per pt History of abnormal pap smears: no  SCREENING STUDIES:  Last mammogram: 12/2020 Last colonoscopy: 2005  MEDICATIONS:  Current Outpatient Medications:    acetaminophen (TYLENOL) 500 MG tablet, Take 1,000 mg by mouth 3 (three) times daily., Disp: , Rfl:    aspirin EC 81 MG tablet, Take 1 tablet (81 mg total) by mouth daily. Swallow whole., Disp: 30 tablet, Rfl: 12   B Complex Vitamins (VITAMIN-B COMPLEX) TABS, Take 1 tablet by mouth daily. , Disp: , Rfl:    Cholecalciferol (VITAMIN D3) 1000 units CAPS,  Take 1,000 Units by mouth daily. , Disp: , Rfl:    clobetasol ointment (TEMOVATE) 0.05 %, Apply 1 Application topically daily as needed (for eczema)., Disp: , Rfl:    Cranberry-Vitamin C-Inulin (UTI-STAT) LIQD, Take 30 mLs by mouth daily in the afternoon., Disp: , Rfl:    cyclobenzaprine (FLEXERIL) 10 MG tablet, Take 1 tablet (10 mg total) by mouth 3 (three) times daily as needed for muscle spasms., Disp: 30 tablet, Rfl: 0   gabapentin (NEURONTIN) 100 MG capsule, TAKE 1 CAPSULE(100 MG) BY MOUTH THREE TIMES DAILY, Disp: 90 capsule, Rfl: 1   levothyroxine (SYNTHROID) 50 MCG tablet, Take 1 tablet (50 mcg total) by mouth daily before breakfast., Disp: 90 tablet, Rfl: 3   losartan-hydrochlorothiazide (HYZAAR) 50-12.5 MG tablet, TAKE 1 TABLET DAILY, Disp: 90 tablet, Rfl: 3   metoprolol tartrate (LOPRESSOR) 25 MG tablet, Take 1 tablet (25 mg total) by mouth 2 (two) times daily., Disp: 60 tablet, Rfl: 0   Multiple Vitamins-Minerals (PRESERVISION  AREDS PO), Take 1 tablet by mouth 2 (two) times daily., Disp: , Rfl:    nitroGLYCERIN (NITROSTAT) 0.4 MG SL tablet, Place 1 tablet (0.4 mg total) under the tongue every 5 (five) minutes as needed for chest pain., Disp: 30 tablet, Rfl: 12   nystatin (MYCOSTATIN/NYSTOP) powder, Apply 1 Application topically daily as needed (for rash/redness)., Disp: , Rfl:    ondansetron (ZOFRAN) 4 MG tablet, Take 1 tablet (4 mg total) by mouth every 6 (six) hours as needed for nausea., Disp: 20 tablet, Rfl: 0   oxyCODONE (OXY IR/ROXICODONE) 5 MG immediate release tablet, Take 1 tablet (5 mg total) by mouth every 6 (six) hours as needed for severe pain., Disp: 10 tablet, Rfl: 0   polyethylene glycol (MIRALAX / GLYCOLAX) 17 g packet, Take 17 g by mouth daily., Disp: 14 each, Rfl: 0   polyvinyl alcohol (LIQUIFILM TEARS) 1.4 % ophthalmic solution, Place 1 drop into both eyes daily as needed for dry eyes., Disp: , Rfl:    amLODipine (NORVASC) 5 MG tablet, Take 5 mg by mouth daily., Disp: , Rfl:    atorvastatin (LIPITOR) 20 MG tablet, Take 20 mg by mouth daily., Disp: , Rfl:    diclofenac Sodium (VOLTAREN) 1 % GEL, Apply 2 g topically., Disp: , Rfl:    fluconazole (DIFLUCAN) 150 MG tablet, Take 1 pill at the first sign of a yeast infection, may repeat in 48 hours if needed, Disp: , Rfl:    losartan (COZAAR) 100 MG tablet, Take 100 mg by mouth daily., Disp: , Rfl:    nitrofurantoin (MACRODANTIN) 100 MG capsule, Take by mouth daily., Disp: , Rfl:    oxybutynin (DITROPAN-XL) 5 MG 24 hr tablet, , Disp: , Rfl:    pantoprazole (PROTONIX) 20 MG tablet, Take 20 mg by mouth 2 (two) times daily., Disp: , Rfl:    phenazopyridine (PYRIDIUM) 200 MG tablet, , Disp: , Rfl:    Potassium Chloride ER 20 MEQ TBCR, Take 1 tablet by mouth daily., Disp: , Rfl:    RA CRANBERRY 500 MG CAPS, Take 1 capsule by mouth daily., Disp: , Rfl:    REFRESH 1.4-0.6 % SOLN, Apply to eye., Disp: , Rfl:   ALLERGIES: Allergies  Allergen Reactions   Sulfa  Antibiotics Rash    Like a sunburn   Codeine Nausea And Vomiting   Darvocet [Propoxyphene N-Acetaminophen] Nausea And Vomiting   Nickel Rash   Vicodin [Hydrocodone-Acetaminophen] Other (See Comments)    GI upset    FAMILY HISTORY: Family  History  Problem Relation Age of Onset   Heart disease Mother    Esophageal cancer Mother    Varicose Veins Mother    Deep vein thrombosis Mother    Diabetes Mother    Hypertension Mother    Depression Mother    Obesity Mother    Heart disease Father    AAA (abdominal aortic aneurysm) Father    Varicose Veins Sister    Lung cancer Sister    Diabetes Sister    Heart disease Sister 56   Hypertension Sister    Vision loss Sister    Diabetes Sister    Heart disease Sister    Hypertension Sister    Vision loss Sister    Diabetes Sister    Vision loss Sister    Diabetes Brother    Vision loss Brother    Peripheral vascular disease Brother    AAA (abdominal aortic aneurysm) Brother    Breast cancer Neg Hx    Ovarian cancer Neg Hx    Endometrial cancer Neg Hx    Colon cancer Neg Hx     SOCIAL HISTORY: Social History   Socioeconomic History   Marital status: Widowed    Spouse name: Not on file   Number of children: Not on file   Years of education: Not on file   Highest education level: Not on file  Occupational History   Occupation: retired  Tobacco Use   Smoking status: Former    Current packs/day: 0.00    Average packs/day: 0.3 packs/day for 13.0 years (3.3 ttl pk-yrs)    Types: Cigarettes    Start date: 06/23/1951    Quit date: 06/22/1964    Years since quitting: 58.7   Smokeless tobacco: Never  Substance and Sexual Activity   Alcohol use: No   Drug use: No   Sexual activity: Never    Birth control/protection: Post-menopausal  Other Topics Concern   Not on file  Social History Narrative   Lives home alone.  Is a widow.  Education: HS grad.  2 children. Caffeine 2.5 cups daily.   Social Determinants of Health    Financial Resource Strain: Low Risk  (11/08/2021)   Overall Financial Resource Strain (CARDIA)    Difficulty of Paying Living Expenses: Not hard at all  Food Insecurity: No Food Insecurity (04/11/2022)   Hunger Vital Sign    Worried About Running Out of Food in the Last Year: Never true    Ran Out of Food in the Last Year: Never true  Transportation Needs: No Transportation Needs (04/11/2022)   PRAPARE - Administrator, Civil Service (Medical): No    Lack of Transportation (Non-Medical): No  Physical Activity: Inactive (11/08/2021)   Exercise Vital Sign    Days of Exercise per Week: 0 days    Minutes of Exercise per Session: 0 min  Stress: No Stress Concern Present (11/08/2021)   [employer] of Occupational Health - Occupational Stress Questionnaire    Feeling of Stress : Not at all  Social Connections: Moderately Integrated (11/08/2021)   Social Connection and Isolation Panel [NHANES]    Frequency of Communication with Friends and Family: More than three times a week    Frequency of Social Gatherings with Friends and Family: More than three times a week    Attends Religious Services: More than 4 times per year    Active Member of [employer] or Organizations: Yes    Attends Banker Meetings: 1 to 4 times per  year    Marital Status: Widowed  Intimate Partner Violence: Not At Risk (04/11/2022)   Humiliation, Afraid, Rape, and Kick questionnaire    Fear of Current or Ex-Partner: No    Emotionally Abused: No    Physically Abused: No    Sexually Abused: No    REVIEW OF SYSTEMS: New patient intake form was reviewed.  Complete 10-system review is negative except for the following: urinary frequency, pelvic pain, leg swelling, problem with walking (walker)  PHYSICAL EXAM: BP (!) 162/58 (BP Location: Left Arm, Patient Position: Sitting) Comment: second bp taken.  Pulse 84   Temp 97.7 F (36.5 C) (Oral)   Resp 18   Ht 5\' 2"  (1.575 m)   Wt 176 lb (79.8 kg)    LMP  (LMP Unknown)   SpO2 98%   BMI 32.19 kg/m  Constitutional: No acute distress. Neuro/Psych: Alert, oriented.  Head and Neck: Normocephalic, atraumatic. Neck symmetric without masses. Sclera anicteric.  Respiratory: Normal work of breathing. Clear to auscultation bilaterally. Cardiovascular: Regular rate and rhythm, no murmurs, rubs, or gallops. Abdomen: Normoactive bowel sounds. Soft, non-distended, non-tender to palpation. No masses appreciated. Well healed pfannenstiel incision. Extremities: Grossly normal range of motion. Warm, well perfused. Skin: No rashes or lesions. Lymphatic: No cervical, supraclavicular, or inguinal adenopathy. Genitourinary: External genitalia without lesions. Urethral meatus without lesions or prolapse. On speculum exam, vagina without lesions. With valsalva, apical prolapse to the introitus. Bimanual exam reveals smooth vaginal mucosa and cuff. Cystic pelvic mass approximately 7cm, right sided. Exam chaperoned by Warner Mccreedy, NP   LABORATORY AND RADIOLOGIC DATA: Outside medical records were reviewed to synthesize the above history, along with the history and physical obtained during the visit.  Outside laboratory, and imaging reports were reviewed, with pertinent results below.  I personally reviewed the outside images.  WBC  Date Value Ref Range Status  01/30/2023 12.4 (H) 4.0 - 10.5 K/uL Final   Hemoglobin  Date Value Ref Range Status  01/30/2023 12.4 12.0 - 15.0 g/dL Final  96/08/5407 81.1 (L) 11.1 - 15.9 g/dL Final   HCT  Date Value Ref Range Status  01/30/2023 36.0 36.0 - 46.0 % Final   Hematocrit  Date Value Ref Range Status  05/11/2022 32.8 (L) 34.0 - 46.6 % Final   Platelets  Date Value Ref Range Status  01/30/2023 162 150 - 400 K/uL Final  05/11/2022 276 150 - 450 x10E3/uL Final   Magnesium  Date Value Ref Range Status  01/18/2023 1.9 1.7 - 2.4 mg/dL Final    Comment:    Performed at [facility], [address],  [place], [place] [address]   Creat  Date Value Ref Range Status  06/05/2022 0.84 0.60 - 0.95 mg/dL Final   Creatinine, Ser  Date Value Ref Range Status  01/30/2023 0.78 0.44 - 1.00 mg/dL Final   AST  Date Value Ref Range Status  01/30/2023 18 15 - 41 U/L Final   ALT  Date Value Ref Range Status  01/30/2023 16 0 - 44 U/L Final   Labs (02/15/23) CA125: 8.4 HE4: 115  Pelvic ultrasound (02/15/23) Transabdominal pelvic ultrasound performed Hyst Right adnexa 7.8 x 7.6 cm cystic mass (mostly simple with 1.8 cm solid area seen in wall) seen. No normal ovarian tissue seen. ?  Ovarian cyst versus adnexal cyst.  No blood flow seen within cystic mass. Left ovary was not visualized.  No left adnexal masses seen. No free fluid seen. Distended colon seen  US PELVIS (TRANSABDOMINAL ONLY)  04/15/2022  Narrative CLINICAL DATA:  Right adnexal cyst noted on prior CT dated 04/08/2022  EXAM: TRANSABDOMINAL ULTRASOUND OF PELVIS  TECHNIQUE: Transabdominal ultrasound examination of the pelvis was performed including evaluation of the uterus, ovaries, adnexal regions, and pelvic cul-de-sac.  COMPARISON:  CT abdomen and pelvis dated 04/08/2022  FINDINGS: Uterus  Surgically absent.  Right ovary  Right adnexal simple-appearing cyst measures 4.9 x 4.0 x 3.0 cm. The right ovary is not seen.  Left ovary  Left ovary is not seen.  Other findings:  No abnormal free fluid.  IMPRESSION: The ovaries are not seen. 4.9 cm simple-appearing right adnexal cyst, almost certainly benign. Consider follow-up ultrasound pelvis in 1 year to assess stability, as clinically indicated.   Electronically Signed By: Agustin Cree M.D. On: 04/15/2022 13:51   MR HIP LEFT W WO CONTRAST 04/14/2022  Narrative CLINICAL DATA:  Left hip pain after fall  EXAM: MRI OF THE LEFT HIP WITHOUT AND WITH CONTRAST  TECHNIQUE: Multiplanar, multisequence MR imaging was performed both before and after administration of  intravenous contrast.  CONTRAST:  7.44mL GADAVIST GADOBUTROL 1 MMOL/ML IV SOLN  COMPARISON:  X-ray 04/14/2022, CT 04/08/2022  FINDINGS: Bones: No acute fracture. No dislocation. No femoral head avascular necrosis. Bony pelvis intact without diastasis. SI joints and pubic symphysis within normal limits. Partially imaged lumbar fusion hardware with associated susceptibility artifact. There is degenerative disc disease at the L5-S1 level, not well assessed on the included imaging planes. No extra-articular sites of bone marrow edema. No marrow replacing bone lesion.  Articular cartilage and labrum  Articular cartilage: Moderate osteoarthritis of the left hip with diffuse cartilage thinning and surface irregularity. No subchondral marrow signal changes.  Labrum:  Superior labral degeneration.  No paralabral cyst.  Joint or bursal effusion  Joint effusion:  None.  Bursae: Small amount of peritrochanteric bursal fluid, right greater than left.  Muscles and tendons  Muscles and tendons: Tendinosis with partial-thickness insertional tears involving the bilateral gluteus medius and gluteus minimus tendons. Mild tendinosis of the bilateral hamstring tendon origins. The iliopsoas, rectus femoris, and adductor tendons appear intact without tear or significant tendinosis. Mild generalized intramuscular edema. No intramuscular fluid collection. Preserved muscle bulk atrophy or fatty infiltration.  Other findings  Miscellaneous: Mild diffuse subcutaneous edema. No organized fluid collection or hematoma. No inguinal lymphadenopathy. 3.8 cm right adnexal cyst with internal septations.  IMPRESSION: 1. No acute osseous abnormality of the left hip or pelvis. 2. Moderate osteoarthritis of the left hip. 3. Tendinosis with partial-thickness insertional tears involving the bilateral gluteus medius and gluteus minimus tendons. 4. Mild tendinosis of the bilateral hamstring tendon origins. 5.  Mild peritrochanteric bursal fluid, right greater than left. 6. Mildly complex 3.8 cm right adnexal cyst. Further assessment with pelvic ultrasound is recommended.   Electronically Signed By: Duanne Guess D.O. On: 04/15/2022 09:46

## 2023-02-26 NOTE — Telephone Encounter (Signed)
Filled out paper work for the nursing facility

## 2023-02-26 NOTE — Progress Notes (Signed)
Patient here for a pre-operative appointment prior to her scheduled surgery on 03/27/2023. She is scheduled for a robotic assisted laparoscopic bilateral salpingo-oophorectomy, possible staging if a precancer or cancer is seen, possible mini laparotomy. The surgery was discussed in detail.  See after visit summary for additional details.      Discussed post-op pain management in detail including the aspects of the enhanced recovery pathway.  We discussed the use of tylenol post-op and to monitor for a maximum of 4,000 mg in a 24 hour period.  Also discussed sennakot to be used after surgery and to hold if having loose stools.  Discussed bowel regimen in detail.     Discussed the use of SCDs and measures to take at home to prevent DVT including frequent mobility.  Reportable signs and symptoms of DVT discussed. Post-operative instructions discussed and expectations for after surgery. Incisional care discussed as well including reportable signs and symptoms including erythema, drainage, wound separation.     30 minutes spent with the patient.  Verbalizing understanding of material discussed. No needs or concerns voiced at the end of the visit.   Advised patient to call for any needs.  Advised that her post-operative medications had been prescribed and could be picked up at any time.    This appointment is included in the global surgical bundle as pre-operative teaching and has no charge.

## 2023-02-27 LAB — CANCER ANTIGEN 19-9: CA 19-9: 16 U/mL (ref 0–35)

## 2023-02-28 ENCOUNTER — Ambulatory Visit: Payer: Medicare Other | Admitting: Nurse Practitioner

## 2023-02-28 ENCOUNTER — Telehealth: Payer: Self-pay | Admitting: *Deleted

## 2023-02-28 NOTE — Telephone Encounter (Signed)
Patient Name: Amber [name]  DOB: 10/09/1940 MRN: [id number]  Primary Cardiologist: Olga Millers, MD  Chart reviewed as part of pre-operative protocol coverage. Given past medical history and time since last visit, based on ACC/AHA guidelines, PRISHA SALVATI is at acceptable risk for the planned procedure without further cardiovascular testing.   Regarding ASA therapy, it may be stopped 5-7 days prior to surgery with a plan to resume it as soon as felt to be feasible from a surgical standpoint in the post-operative period.   The patient was advised that if she develops new symptoms prior to surgery to contact our office to arrange for a follow-up visit, and she verbalized understanding.  I will route this recommendation to the requesting party via Epic fax function and remove from pre-op pool.  Please call with questions.  Napoleon Form, Leodis Rains, NP 02/28/2023, 12:31 PM

## 2023-02-28 NOTE — Telephone Encounter (Signed)
Per Dr Ernestina Patches fax surgical optimization form to patient's cardiology office

## 2023-02-28 NOTE — Telephone Encounter (Signed)
Pre-operative Risk Assessment    Patient Name: Amber [name]  DOB: [birth year] MRN: [id number]   DATE OF LAST VISIT: 02/23/23 Carlos Levering, NP DATE OF NEXT VISIT: NONE  Request for Surgical Clearance    Procedure:   ROBOTIC  ASSISTED LAPAROSCOPIC B/L SALPINGO-OOPHORECTOMY, POSSIBLE STAGING, POSSIBLE MINI LAPAROTOMY   Date of Surgery:  Clearance 03/27/23                                 Surgeon:  DR. Clide Cliff Surgeon's Group or Practice Name:  [facility] Phone number:  [phone] Fax number:  [phone]   Type of Clearance Requested:   - Medical ; ASA    Type of Anesthesia:  General    Additional requests/questions:    Elpidio Anis   02/28/2023, 12:13 PM

## 2023-03-01 ENCOUNTER — Ambulatory Visit (HOSPITAL_COMMUNITY)
Admission: RE | Admit: 2023-03-01 | Discharge: 2023-03-01 | Disposition: A | Discharge status: Home / Self Care | Payer: Medicare Other | Source: Ambulatory Visit | Attending: Psychiatry | Admitting: Psychiatry

## 2023-03-01 DIAGNOSIS — N9489 Other specified conditions associated with female genital organs and menstrual cycle: Secondary | ICD-10-CM | POA: Insufficient documentation

## 2023-03-01 MED ORDER — IOHEXOL 300 MG/ML  SOLN
100.0000 mL | Freq: Once | INTRAMUSCULAR | Status: AC | PRN
  Administered 2023-03-01: 100 mL via INTRAVENOUS

## 2023-03-01 MED ORDER — IOHEXOL 9 MG/ML PO SOLN
1000.0000 mL | Freq: Once | ORAL | Status: AC
  Administered 2023-03-01: 1000 mL via ORAL

## 2023-03-07 NOTE — Telephone Encounter (Signed)
Received clearance

## 2023-03-14 ENCOUNTER — Encounter: Payer: Self-pay | Admitting: Obstetrics and Gynecology

## 2023-03-14 NOTE — Progress Notes (Addendum)
COVID Vaccine Completed: yes  Date of COVID positive in last 90 days:  PCP - [facility] [place] [place] Cardiologist - Olga Millers, MD  Cardiac clearance by Ander Slade, NP 02/28/23 in Epic   Chest x-ray - 01/18/23 Epic EKG - 01/18/23 Epic- LBBB Stress Test - 30 years ago per pt ECHO - 04/08/22 Epic Cardiac Cath - 04/10/22 Epic Pacemaker/ICD device last checked: n/a Spinal Cord Stimulator: n/a  Bowel Prep - no  Sleep Study - n/a CPAP -   Fasting Blood Sugar - n/a Checks Blood Sugar _____ times a day  Last dose of GLP1 agonist-  N/A GLP1 instructions:  N/A   Last dose of SGLT-2 inhibitors-  N/A SGLT-2 instructions: N/A   Blood Thinner Instructions:  Time Aspirin Instructions: ASA 81, hold 5-7 days Last Dose:  Activity level: Can go up a flight of stairs and perform activities of daily living without stopping and without symptoms of chest pain or shortness of breath.   Anesthesia review: PVD, LBBB, NSTEMI, CAD, thrombocytopenia, sepsis, septic shock, blood clots  Patient denies shortness of breath, fever, cough and chest pain at PAT appointment  Patient verbalized understanding of instructions that were given to them at the PAT appointment. Patient was also instructed that they will need to review over the PAT instructions again at home before surgery.

## 2023-03-16 ENCOUNTER — Encounter (HOSPITAL_COMMUNITY)
Admission: RE | Admit: 2023-03-16 | Discharge: 2023-03-16 | Disposition: A | Discharge status: Home / Self Care | Payer: Medicare Other | Source: Ambulatory Visit | Attending: Psychiatry | Admitting: Psychiatry

## 2023-03-16 ENCOUNTER — Other Ambulatory Visit: Payer: Self-pay

## 2023-03-16 ENCOUNTER — Encounter (HOSPITAL_COMMUNITY): Payer: Self-pay

## 2023-03-16 DIAGNOSIS — Z86718 Personal history of other venous thrombosis and embolism: Secondary | ICD-10-CM | POA: Diagnosis not present

## 2023-03-16 DIAGNOSIS — I739 Peripheral vascular disease, unspecified: Secondary | ICD-10-CM | POA: Insufficient documentation

## 2023-03-16 DIAGNOSIS — I447 Left bundle-branch block, unspecified: Secondary | ICD-10-CM | POA: Diagnosis not present

## 2023-03-16 DIAGNOSIS — I2510 Atherosclerotic heart disease of native coronary artery without angina pectoris: Secondary | ICD-10-CM | POA: Insufficient documentation

## 2023-03-16 DIAGNOSIS — Z01818 Encounter for other preprocedural examination: Secondary | ICD-10-CM | POA: Diagnosis present

## 2023-03-16 DIAGNOSIS — N9489 Other specified conditions associated with female genital organs and menstrual cycle: Secondary | ICD-10-CM | POA: Insufficient documentation

## 2023-03-16 DIAGNOSIS — Z01812 Encounter for preprocedural laboratory examination: Secondary | ICD-10-CM | POA: Insufficient documentation

## 2023-03-16 DIAGNOSIS — I10 Essential (primary) hypertension: Secondary | ICD-10-CM | POA: Insufficient documentation

## 2023-03-16 HISTORY — DX: Pneumonia, unspecified organism: J18.9

## 2023-03-16 LAB — TYPE AND SCREEN
ABO/RH(D): O POS
Antibody Screen: NEGATIVE

## 2023-03-16 LAB — BASIC METABOLIC PANEL
Anion gap: 12 (ref 5–15)
BUN: 11 mg/dL (ref 8–23)
CO2: 27 mmol/L (ref 22–32)
Calcium: 9.3 mg/dL (ref 8.9–10.3)
Chloride: 97 mmol/L — ABNORMAL LOW (ref 98–111)
Creatinine, Ser: 0.67 mg/dL (ref 0.44–1.00)
GFR, Estimated: >60 mL/min (ref >60)
Glucose, Bld: 106 mg/dL — ABNORMAL HIGH (ref 70–99)
Potassium: 3.5 mmol/L (ref 3.5–5.1)
Sodium: 136 mmol/L (ref 135–145)

## 2023-03-16 LAB — CBC
HCT: 35.6 % — ABNORMAL LOW (ref 36.0–46.0)
Hemoglobin: 12.2 g/dL (ref 12.0–15.0)
MCH: 32.5 pg (ref 26.0–34.0)
MCHC: 34.3 g/dL (ref 30.0–36.0)
MCV: 94.9 fL (ref 80.0–100.0)
Platelets: 156 10*3/uL (ref 150–400)
RBC: 3.75 MIL/uL — ABNORMAL LOW (ref 3.87–5.11)
RDW: 13.7 % (ref 11.5–15.5)
WBC: 8.9 10*3/uL (ref 4.0–10.5)
nRBC: 0 % (ref 0.0–0.2)

## 2023-03-16 NOTE — Patient Instructions (Signed)
SURGICAL WAITING ROOM VISITATION  Patients having surgery or a procedure may have no more than 2 support people in the waiting area - these visitors may rotate.    Children under the age of 57 must have an adult with them who is not the patient.  Due to an increase in RSV and influenza rates and associated hospitalizations, children ages 63 and under may not visit patients in [facility] hospitals.  If the patient needs to stay at the hospital during part of their recovery, the visitor guidelines for inpatient rooms apply. Pre-op nurse will coordinate an appropriate time for 1 support person to accompany patient in pre-op.  This support person may not rotate.    Please refer to the [facility] website for the visitor guidelines for Inpatients (after your surgery is over and you are in a regular room).    Your procedure is scheduled on: 03/27/23   Report to [facility] Main Entrance    Report to admitting at 9:15 AM   Call this number if you have problems the morning of surgery [phone]   Do not eat food :After Midnight.   After Midnight you may have the following liquids until 8:30 AM DAY OF SURGERY  Water Non-Citrus Juices (without pulp, NO RED-Apple, White grape, White cranberry) Black Coffee (NO MILK/CREAM OR CREAMERS, sugar ok)  Clear Tea (NO MILK/CREAM OR CREAMERS, sugar ok) regular and decaf                             Plain Jell-O (NO RED)                                           Fruit ices (not with fruit pulp, NO RED)                                     Popsicles (NO RED)                                                               Sports drinks like Gatorade (NO RED)                      If you have questions, please contact your surgeon's office.   FOLLOW BOWEL PREP AND ANY ADDITIONAL PRE OP INSTRUCTIONS YOU RECEIVED FROM YOUR SURGEON'S OFFICE!!!     Oral Hygiene is also important to reduce your risk of infection.                                     Remember - BRUSH YOUR TEETH THE MORNING OF SURGERY WITH YOUR REGULAR TOOTHPASTE  DENTURES WILL BE REMOVED PRIOR TO SURGERY PLEASE DO NOT APPLY "Poly grip" OR ADHESIVES!!!   Stop all vitamins and herbal supplements 7 days before surgery.   Take these medicines the morning of surgery with A SIP OF WATER: Tylenol, Amlodipine, Gabapentin, Levothyroxine, Metoprolol ,Zofran, Oxybutynin, Oxycodone, Pantoprazole  You may not have any metal on your body including hair pins, jewelry, and body piercing             Do not wear make-up, lotions, powders, perfumes, or deodorant  Do not wear nail polish including gel and S&S, artificial/acrylic nails, or any other type of covering on natural nails including finger and toenails. If you have artificial nails, gel coating, etc. that needs to be removed by a nail salon please have this removed prior to surgery or surgery may need to be canceled/ delayed if the surgeon/ anesthesia feels like they are unable to be safely monitored.   Do not shave  48 hours prior to surgery.    Do not bring valuables to the hospital. [name] IS NOT             RESPONSIBLE   FOR VALUABLES.   Contacts, glasses, dentures or bridgework may not be worn into surgery.  DO NOT BRING YOUR HOME MEDICATIONS TO THE HOSPITAL. PHARMACY WILL DISPENSE MEDICATIONS LISTED ON YOUR MEDICATION LIST TO YOU DURING YOUR ADMISSION IN THE HOSPITAL!    Patients discharged on the day of surgery will not be allowed to drive home.  Someone NEEDS to stay with you for the first 24 hours after anesthesia.              Please read over the following fact sheets you were given: IF YOU HAVE QUESTIONS ABOUT YOUR PRE-OP INSTRUCTIONS PLEASE CALL [name]    If you received a COVID test during your pre-op visit  it is requested that you wear a mask when out in public, stay away from anyone that may not be feeling well and notify your surgeon if you develop symptoms. If  you test positive for Covid or have been in contact with anyone that has tested positive in the last 10 days please notify you surgeon.     - Preparing for Surgery Before surgery, you can play an important role.  Because skin is not sterile, your skin needs to be as free of germs as possible.  You can reduce the number of germs on your skin by washing with CHG (chlorahexidine gluconate) soap before surgery.  CHG is an antiseptic cleaner which kills germs and bonds with the skin to continue killing germs even after washing. Please DO NOT use if you have an allergy to CHG or antibacterial soaps.  If your skin becomes reddened/irritated stop using the CHG and inform your nurse when you arrive at Short Stay. Do not shave (including legs and underarms) for at least 48 hours prior to the first CHG shower.  You may shave your face/neck.  Please follow these instructions carefully:  1.  Shower with CHG Soap the night before surgery and the  morning of surgery.  2.  If you choose to wash your hair, wash your hair first as usual with your normal  shampoo.  3.  After you shampoo, rinse your hair and body thoroughly to remove the shampoo.                             4.  Use CHG as you would any other liquid soap.  You can apply chg directly to the skin and wash.  Gently with a scrungie or clean washcloth.  5.  Apply the CHG Soap to your body ONLY FROM THE NECK DOWN.   Do   not use on  face/ open                           Wound or open sores. Avoid contact with eyes, ears mouth and   genitals (private parts).                       Wash face,  Genitals (private parts) with your normal soap.             6.  Wash thoroughly, paying special attention to the area where your    surgery  will be performed.  7.  Thoroughly rinse your body with warm water from the neck down.  8.  DO NOT shower/wash with your normal soap after using and rinsing off the CHG Soap.                9.  Pat yourself dry with a clean  towel.            10.  Wear clean pajamas.            11.  Place clean sheets on your bed the night of your first shower and do not  sleep with pets. Day of Surgery : Do not apply any lotions/deodorants the morning of surgery.  Please wear clean clothes to the hospital/surgery center.  FAILURE TO FOLLOW THESE INSTRUCTIONS MAY RESULT IN THE CANCELLATION OF YOUR SURGERY  PATIENT SIGNATURE_________________________________  NURSE SIGNATURE__________________________________  ________________________________________________________________________ WHAT IS A BLOOD TRANSFUSION? Blood Transfusion Information  A transfusion is the replacement of blood or some of its parts. Blood is made up of multiple cells which provide different functions. Red blood cells carry oxygen and are used for blood loss replacement. White blood cells fight against infection. Platelets control bleeding. Plasma helps clot blood. Other blood products are available for specialized needs, such as hemophilia or other clotting disorders. BEFORE THE TRANSFUSION  Who gives blood for transfusions?  Healthy volunteers who are fully evaluated to make sure their blood is safe. This is blood bank blood. Transfusion therapy is the safest it has ever been in the practice of medicine. Before blood is taken from a donor, a complete history is taken to make sure that person has no history of diseases nor engages in risky social behavior (examples are intravenous drug use or sexual activity with multiple partners). The donor's travel history is screened to minimize risk of transmitting infections, such as malaria. The donated blood is tested for signs of infectious diseases, such as HIV and hepatitis. The blood is then tested to be sure it is compatible with you in order to minimize the chance of a transfusion reaction. If you or a relative donates blood, this is often done in anticipation of surgery and is not appropriate for emergency  situations. It takes many days to process the donated blood. RISKS AND COMPLICATIONS Although transfusion therapy is very safe and saves many lives, the main dangers of transfusion include:  Getting an infectious disease. Developing a transfusion reaction. This is an allergic reaction to something in the blood you were given. Every precaution is taken to prevent this. The decision to have a blood transfusion has been considered carefully by your caregiver before blood is given. Blood is not given unless the benefits outweigh the risks. AFTER THE TRANSFUSION Right after receiving a blood transfusion, you will usually feel much better and more energetic. This is especially true if your red blood cells have gotten  low (anemic). The transfusion raises the level of the red blood cells which carry oxygen, and this usually causes an energy increase. The nurse administering the transfusion will monitor you carefully for complications. HOME CARE INSTRUCTIONS  No special instructions are needed after a transfusion. You may find your energy is better. Speak with your caregiver about any limitations on activity for underlying diseases you may have. SEEK MEDICAL CARE IF:  Your condition is not improving after your transfusion. You develop redness or irritation at the intravenous (IV) site. SEEK IMMEDIATE MEDICAL CARE IF:  Any of the following symptoms occur over the next 12 hours: Shaking chills. You have a temperature by mouth above 102 F (38.9 C), not controlled by medicine. Chest, back, or muscle pain. People around you feel you are not acting correctly or are confused. Shortness of breath or difficulty breathing. Dizziness and fainting. You get a rash or develop hives. You have a decrease in urine output. Your urine turns a dark color or changes to pink, red, or brown. Any of the following symptoms occur over the next 10 days: You have a temperature by mouth above 102 F (38.9 C), not controlled  by medicine. Shortness of breath. Weakness after normal activity. The white part of the eye turns yellow (jaundice). You have a decrease in the amount of urine or are urinating less often. Your urine turns a dark color or changes to pink, red, or brown. Document Released: 05/12/2000 Document Revised: 08/07/2011 Document Reviewed: 12/30/2007 [facility] Patient Information 2014 [place], [place].  _______________________________________________________________________

## 2023-03-19 NOTE — Anesthesia Preprocedure Evaluation (Addendum)
Anesthesia Evaluation  Patient identified by MRN, date of birth, ID band Patient awake    Reviewed: Allergy & Precautions, NPO status , Patient's Chart, lab work & pertinent test results, reviewed documented beta blocker date and time   Airway Mallampati: I  TM Distance: >3 FB Neck ROM: Full    Dental  (+) Caps, Dental Advisory Given   Pulmonary former smoker   Pulmonary exam normal breath sounds clear to auscultation       Cardiovascular hypertension, Pt. on home beta blockers and Pt. on medications + CAD, + Past MI, + Peripheral Vascular Disease and + DVT  Normal cardiovascular exam+ dysrhythmias (LBBB)  Rhythm:Regular Rate:Normal  CV: Cardiac Cath 04/11/2023   Mid LAD lesion is 20% stenosed.   Mid Cx lesion is 30% stenosed.   Mid Cx to Dist Cx lesion is 20% stenosed.   Prox RCA-1 lesion is 30% stenosed.   Prox RCA-2 lesion is 20% stenosed.   Mid RCA lesion is 20% stenosed.   Mild nonobstructive CAD with a percent mid LAD stenosis; 30% AV groove circumflex stenosis; and 20 to 30% RCA stenosis in a dominant vessel.   There is moderate mitral annular calcification.   Elevated LVEDP at 24 mm.   RECOMMENDATION: Aspirin 81 mg.  Medical therapy for CAD.   Echo 04/08/2022 1. Left ventricular ejection fraction, by estimation, is 45 to 50%. The  left ventricle has mildly decreased function. The left ventricle has no  regional wall motion abnormalities. There is moderate left ventricular  hypertrophy. Left ventricular  diastolic parameters are consistent with Grade II diastolic dysfunction  (pseudonormalization).   2. Right ventricular systolic function is normal. The right ventricular  size is normal. There is mildly elevated pulmonary artery systolic  pressure.   3. The mitral valve is normal in structure. Mild mitral valve  regurgitation. No evidence of mitral stenosis.   4. The tricuspid valve is abnormal. Tricuspid  valve regurgitation is mild  to moderate.   5. The aortic valve is tricuspid. Aortic valve regurgitation is not  visualized. No aortic stenosis is present.   6. The inferior vena cava is dilated in size with >50% respiratory  variability, suggesting right atrial pressure of 8 mmHg.     Neuro/Psych negative neurological ROS  negative psych ROS   GI/Hepatic Neg liver ROS,GERD  ,,  Endo/Other  Hypothyroidism    Renal/GU negative Renal ROS  negative genitourinary   Musculoskeletal  (+) Arthritis ,    Abdominal   Peds  Hematology negative hematology ROS (+)   Anesthesia Other Findings Oxycodone 5mg  QID  Reproductive/Obstetrics                             Anesthesia Physical Anesthesia Plan  ASA: 3  Anesthesia Plan: General   Post-op Pain Management: Tylenol PO (pre-op)* and Ketamine IV*   Induction: Intravenous  PONV Risk Score and Plan: 3 and Dexamethasone, Ondansetron and Treatment may vary due to age or medical condition  Airway Management Planned: Oral ETT  Additional Equipment:   Intra-op Plan:   Post-operative Plan: Extubation in OR  Informed Consent: I have reviewed the patients History and Physical, chart, labs and discussed the procedure including the risks, benefits and alternatives for the proposed anesthesia with the patient or authorized representative who has indicated his/her understanding and acceptance.     Dental advisory given  Plan Discussed with: CRNA  Anesthesia Plan Comments: (2 IVs)  Anesthesia Quick Evaluation

## 2023-03-19 NOTE — Progress Notes (Signed)
Anesthesia Chart Review   Case: [id number] Date/Time: 03/27/23 1115   Procedure: XI ROBOTIC ASSISTED SALPINGO OOPHORECTOMY; POSSIBLE STAGING; POSSIBLE MINI LAPAROTOMY (Bilateral)   Anesthesia type: General   Pre-op diagnosis: ADNEXAL MASS   Location: [facility] ROOM 05 / [facility]   Surgeons: Clide Cliff, MD       DISCUSSION:82 y.o. former smoker with h/o HTN, nonobstructive CAD, chronic LBBB, DVT, PVD, adnexal mass scheduled for above procedure 03/27/2023 with Dr. Clide Cliff.   Per cardiology preoperative evaluation 02/28/23, "Chart reviewed as part of pre-operative protocol coverage. Given past medical history and time since last visit, based on ACC/AHA guidelines, [name] is at acceptable risk for the planned procedure without further cardiovascular testing.    Regarding ASA therapy, it may be stopped 5-7 days prior to surgery with a plan to resume it as soon as felt to be feasible from a surgical standpoint in the post-operative period."  VS: BP (!) 122/53   Pulse 71   Temp 36.6 C (Oral)   Resp 16   Ht 5\' 1"  (1.549 m)   LMP  (LMP Unknown)   SpO2 98%   BMI 33.25 kg/m   PROVIDERS: [place], [facility]  Olga Millers, MD is Cardiologist  LABS: Labs reviewed: Acceptable for surgery. (all labs ordered are listed, but only abnormal results are displayed)  Labs Reviewed  CBC - Abnormal; Notable for the following components:      Result Value   RBC 3.75 (*)    HCT 35.6 (*)    All other components within normal limits  BASIC METABOLIC PANEL - Abnormal; Notable for the following components:   Chloride 97 (*)    Glucose, Bld 106 (*)    All other components within normal limits  TYPE AND SCREEN     IMAGES:   EKG:   CV: Cardiac Cath 04/11/2023   Mid LAD lesion is 20% stenosed.   Mid Cx lesion is 30% stenosed.   Mid Cx to Dist Cx lesion is 20% stenosed.   Prox RCA-1 lesion is 30% stenosed.   Prox RCA-2 lesion is 20% stenosed.   Mid RCA lesion is 20%  stenosed.   Mild nonobstructive CAD with a percent mid LAD stenosis; 30% AV groove circumflex stenosis; and 20 to 30% RCA stenosis in a dominant vessel.   There is moderate mitral annular calcification.   Elevated LVEDP at 24 mm.   RECOMMENDATION: Aspirin 81 mg.  Medical therapy for CAD.  Echo 04/08/2022 1. Left ventricular ejection fraction, by estimation, is 45 to 50%. The  left ventricle has mildly decreased function. The left ventricle has no  regional wall motion abnormalities. There is moderate left ventricular  hypertrophy. Left ventricular  diastolic parameters are consistent with Grade II diastolic dysfunction  (pseudonormalization).   2. Right ventricular systolic function is normal. The right ventricular  size is normal. There is mildly elevated pulmonary artery systolic  pressure.   3. The mitral valve is normal in structure. Mild mitral valve  regurgitation. No evidence of mitral stenosis.   4. The tricuspid valve is abnormal. Tricuspid valve regurgitation is mild  to moderate.   5. The aortic valve is tricuspid. Aortic valve regurgitation is not  visualized. No aortic stenosis is present.   6. The inferior vena cava is dilated in size with >50% respiratory  variability, suggesting right atrial pressure of 8 mmHg.  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   Rotator cuff rupture, complete 07/05/2011   Varicose veins     Past Surgical History:  Procedure Laterality Date   APPENDECTOMY  1979   BACK SURGERY  2009   ENDOVENOUS ABLATION SAPHENOUS VEIN W/ LASER Right 09/15/2013   right greater saphenous vein, by Gretta Began MD   ENDOVENOUS ABLATION SAPHENOUS VEIN  W/ LASER Left 10/16/2013   left greater saphenous vein, by Gretta Began MD   LEFT HEART CATH AND CORONARY ANGIOGRAPHY N/A 04/10/2022   Procedure: LEFT HEART CATH AND CORONARY ANGIOGRAPHY;  Surgeon: Lennette Bihari, MD;  Location: [facility];  Service: Cardiovascular;  Laterality: N/A;   left open rotator cuff surgery   2010   lumbar infusion  01/16/2008   PARTIAL HYSTERECTOMY  1979   TAH pfannenstiel, fibroids   SHOULDER OPEN ROTATOR CUFF REPAIR  07/05/2011   Procedure: ROTATOR CUFF REPAIR SHOULDER OPEN;  Surgeon: Jacki Cones, MD;  Location: [facility];  Service: Orthopedics;  Laterality: Right;   TOTAL KNEE ARTHROPLASTY Right 09/09/2014   Procedure: TOTAL RIGHT  KNEE ARTHROPLASTY;  Surgeon: Ranee Gosselin, MD;  Location: [facility];  Service: Orthopedics;  Laterality: Right;    MEDICATIONS:  acetaminophen (TYLENOL) 500 MG tablet   amLODipine (NORVASC) 5 MG tablet   aspirin EC 81 MG tablet   atorvastatin (LIPITOR) 20 MG tablet   B Complex Vitamins (VITAMIN-B COMPLEX) TABS   calcium carbonate (TUMS EX) 750 MG chewable tablet   Cholecalciferol (VITAMIN D3) 1000 units CAPS   clobetasol ointment (TEMOVATE) 0.05 %   Cranberry-Vitamin C-Inulin (UTI-STAT) LIQD   Cranberry-Vitamin C-Probiotic (AZO CRANBERRY PO)   cyclobenzaprine (FLEXERIL) 10 MG tablet   diclofenac Sodium (VOLTAREN) 1 % GEL   fluconazole (DIFLUCAN) 150 MG tablet   gabapentin (NEURONTIN) 100 MG capsule   levothyroxine (SYNTHROID) 50 MCG tablet   losartan-hydrochlorothiazide (HYZAAR) 100-12.5 MG tablet   losartan-hydrochlorothiazide (HYZAAR) 50-12.5 MG tablet   metoprolol tartrate (LOPRESSOR) 25 MG tablet   Multiple Vitamins-Minerals (PRESERVISION AREDS PO)   nitrofurantoin (MACRODANTIN) 100 MG capsule   nitroGLYCERIN (NITROSTAT) 0.4 MG SL tablet   nystatin (MYCOSTATIN/NYSTOP) powder   ondansetron (ZOFRAN) 4 MG tablet   oxybutynin (DITROPAN-XL) 5 MG 24 hr tablet   oxyCODONE (OXY IR/ROXICODONE) 5 MG immediate release  tablet   pantoprazole (PROTONIX) 20 MG tablet   polyethylene glycol (MIRALAX / GLYCOLAX) 17 g packet   polyvinyl alcohol (LIQUIFILM TEARS) 1.4 % ophthalmic solution   REFRESH 1.4-0.6 % SOLN   senna-docusate (SENOKOT-S) 8.6-50 MG tablet   No current facility-administered medications for this encounter.     Jodell Cipro Ward, PA-C [facility] [phone]

## 2023-03-26 ENCOUNTER — Telehealth: Payer: Self-pay | Admitting: Gynecologic Oncology

## 2023-03-26 ENCOUNTER — Telehealth: Payer: Self-pay | Admitting: *Deleted

## 2023-03-26 NOTE — Telephone Encounter (Signed)
Telephone call to check on pre-operative status.  Patient compliant with pre-operative instructions.  Reinforced nothing to eat after midnight. Clear liquids until 0745. Patient to arrive at 0845.  No questions or concerns voiced.  Instructed to call for any needs.  °

## 2023-03-26 NOTE — Telephone Encounter (Signed)
Attempted to return call to family member who called earlier with questions related to surgery. Left message.

## 2023-03-27 ENCOUNTER — Ambulatory Visit (HOSPITAL_BASED_OUTPATIENT_CLINIC_OR_DEPARTMENT_OTHER): Payer: Self-pay | Admitting: Anesthesiology

## 2023-03-27 ENCOUNTER — Other Ambulatory Visit: Payer: Self-pay

## 2023-03-27 ENCOUNTER — Encounter (HOSPITAL_COMMUNITY): Payer: Self-pay | Admitting: Psychiatry

## 2023-03-27 ENCOUNTER — Encounter (HOSPITAL_COMMUNITY)
Admission: RE | Disposition: A | Discharge status: Other Acute Inpatient Hospital | Payer: Self-pay | Source: Ambulatory Visit | Attending: Psychiatry

## 2023-03-27 ENCOUNTER — Ambulatory Visit (HOSPITAL_COMMUNITY): Payer: Medicare Other | Admitting: Physician Assistant

## 2023-03-27 ENCOUNTER — Observation Stay (HOSPITAL_COMMUNITY)
Admission: RE | Admit: 2023-03-27 | Discharge: 2023-03-29 | Payer: Medicare Other | Source: Ambulatory Visit | Attending: Psychiatry | Admitting: Psychiatry

## 2023-03-27 DIAGNOSIS — Z96651 Presence of right artificial knee joint: Secondary | ICD-10-CM | POA: Insufficient documentation

## 2023-03-27 DIAGNOSIS — Z86718 Personal history of other venous thrombosis and embolism: Secondary | ICD-10-CM | POA: Diagnosis not present

## 2023-03-27 DIAGNOSIS — R1900 Intra-abdominal and pelvic swelling, mass and lump, unspecified site: Secondary | ICD-10-CM

## 2023-03-27 DIAGNOSIS — I2510 Atherosclerotic heart disease of native coronary artery without angina pectoris: Secondary | ICD-10-CM

## 2023-03-27 DIAGNOSIS — Z7982 Long term (current) use of aspirin: Secondary | ICD-10-CM | POA: Insufficient documentation

## 2023-03-27 DIAGNOSIS — N83291 Other ovarian cyst, right side: Secondary | ICD-10-CM | POA: Diagnosis present

## 2023-03-27 DIAGNOSIS — Z87891 Personal history of nicotine dependence: Secondary | ICD-10-CM | POA: Insufficient documentation

## 2023-03-27 DIAGNOSIS — N736 Female pelvic peritoneal adhesions (postinfective): Secondary | ICD-10-CM | POA: Diagnosis not present

## 2023-03-27 DIAGNOSIS — D271 Benign neoplasm of left ovary: Secondary | ICD-10-CM

## 2023-03-27 DIAGNOSIS — E039 Hypothyroidism, unspecified: Secondary | ICD-10-CM | POA: Insufficient documentation

## 2023-03-27 DIAGNOSIS — N858 Other specified noninflammatory disorders of uterus: Secondary | ICD-10-CM | POA: Diagnosis not present

## 2023-03-27 DIAGNOSIS — N838 Other noninflammatory disorders of ovary, fallopian tube and broad ligament: Secondary | ICD-10-CM | POA: Insufficient documentation

## 2023-03-27 DIAGNOSIS — I10 Essential (primary) hypertension: Secondary | ICD-10-CM | POA: Insufficient documentation

## 2023-03-27 DIAGNOSIS — N9489 Other specified conditions associated with female genital organs and menstrual cycle: Principal | ICD-10-CM | POA: Diagnosis present

## 2023-03-27 HISTORY — PX: ROBOTIC ASSISTED SALPINGO OOPHERECTOMY: SHX6082

## 2023-03-27 SURGERY — SALPINGO-OOPHORECTOMY, ROBOT-ASSISTED
Anesthesia: General | Laterality: Bilateral

## 2023-03-27 MED ORDER — ONDANSETRON HCL 4 MG PO TABS: 4.0000 mg | ORAL_TABLET | Freq: Four times a day (QID) | ORAL | Status: DC | PRN

## 2023-03-27 MED ORDER — SUGAMMADEX SODIUM 200 MG/2ML IV SOLN
INTRAVENOUS | Status: DC | PRN
  Administered 2023-03-27: 200 mg via INTRAVENOUS

## 2023-03-27 MED ORDER — ACETAMINOPHEN 500 MG PO TABS: 1000.0000 mg | ORAL_TABLET | Freq: Two times a day (BID) | ORAL | Status: DC

## 2023-03-27 MED ORDER — FENTANYL CITRATE PF 50 MCG/ML IJ SOSY: 25.0000 ug | PREFILLED_SYRINGE | INTRAMUSCULAR | Status: DC | PRN

## 2023-03-27 MED ORDER — STERILE WATER FOR IRRIGATION IR SOLN
Status: DC | PRN
  Administered 2023-03-27: 1000 mL

## 2023-03-27 MED ORDER — LABETALOL HCL 5 MG/ML IV SOLN
INTRAVENOUS | Status: AC
  Administered 2023-03-27: 10 mg via INTRAVENOUS
  Filled 2023-03-27: qty 4

## 2023-03-27 MED ORDER — LIDOCAINE HCL (PF) 2 % IJ SOLN
INTRAMUSCULAR | Status: AC
  Filled 2023-03-27: qty 5

## 2023-03-27 MED ORDER — FENTANYL CITRATE (PF) 100 MCG/2ML IJ SOLN
INTRAMUSCULAR | Status: DC | PRN
  Administered 2023-03-27: 100 ug via INTRAVENOUS
  Administered 2023-03-27 (×2): 50 ug via INTRAVENOUS

## 2023-03-27 MED ORDER — KETAMINE HCL 10 MG/ML IJ SOLN
INTRAMUSCULAR | Status: DC | PRN
  Administered 2023-03-27: 20 mg via INTRAVENOUS

## 2023-03-27 MED ORDER — AMLODIPINE BESYLATE 5 MG PO TABS
5.0000 mg | ORAL_TABLET | Freq: Every day | ORAL | Status: DC
  Administered 2023-03-28 – 2023-03-29 (×2): 5 mg via ORAL
  Filled 2023-03-27 (×2): qty 1

## 2023-03-27 MED ORDER — SENNOSIDES-DOCUSATE SODIUM 8.6-50 MG PO TABS
2.0000 | ORAL_TABLET | Freq: Every day | ORAL | Status: DC
  Administered 2023-03-27 – 2023-03-28 (×2): 2 via ORAL
  Filled 2023-03-27 (×2): qty 2

## 2023-03-27 MED ORDER — TRAMADOL HCL 50 MG PO TABS
50.0000 mg | ORAL_TABLET | Freq: Two times a day (BID) | ORAL | Status: DC | PRN
  Administered 2023-03-28: 50 mg via ORAL
  Filled 2023-03-27: qty 1

## 2023-03-27 MED ORDER — LIDOCAINE HCL (PF) 2 % IJ SOLN
INTRAMUSCULAR | Status: DC | PRN
  Administered 2023-03-27: 1.5 mg/kg/h via INTRADERMAL

## 2023-03-27 MED ORDER — HEPARIN SODIUM (PORCINE) 5000 UNIT/ML IJ SOLN
5000.0000 [IU] | INTRAMUSCULAR | Status: AC
  Administered 2023-03-27: 5000 [IU] via SUBCUTANEOUS
  Filled 2023-03-27: qty 1

## 2023-03-27 MED ORDER — LIDOCAINE HCL (CARDIAC) PF 100 MG/5ML IV SOSY
PREFILLED_SYRINGE | INTRAVENOUS | Status: DC | PRN
  Administered 2023-03-27: 40 mg via INTRAVENOUS

## 2023-03-27 MED ORDER — FENTANYL CITRATE (PF) 100 MCG/2ML IJ SOLN
INTRAMUSCULAR | Status: AC
  Filled 2023-03-27: qty 2

## 2023-03-27 MED ORDER — SODIUM CHLORIDE (PF) 0.9 % IJ SOLN
INTRAMUSCULAR | Status: AC
  Filled 2023-03-27: qty 50

## 2023-03-27 MED ORDER — ACETAMINOPHEN 325 MG PO TABS
650.0000 mg | ORAL_TABLET | Freq: Four times a day (QID) | ORAL | Status: DC
  Administered 2023-03-27 – 2023-03-29 (×6): 650 mg via ORAL
  Filled 2023-03-27 (×6): qty 2

## 2023-03-27 MED ORDER — OXYBUTYNIN CHLORIDE ER 5 MG PO TB24
5.0000 mg | ORAL_TABLET | Freq: Every day | ORAL | Status: DC
  Administered 2023-03-27 – 2023-03-28 (×2): 5 mg via ORAL
  Filled 2023-03-27 (×2): qty 1

## 2023-03-27 MED ORDER — DEXAMETHASONE SODIUM PHOSPHATE 4 MG/ML IJ SOLN: 4.0000 mg | INTRAMUSCULAR | Status: DC

## 2023-03-27 MED ORDER — ONDANSETRON HCL 4 MG/2ML IJ SOLN
INTRAMUSCULAR | Status: AC
  Filled 2023-03-27: qty 2

## 2023-03-27 MED ORDER — LABETALOL HCL 5 MG/ML IV SOLN: 10.0000 mg | Freq: Once | INTRAVENOUS | Status: AC | PRN

## 2023-03-27 MED ORDER — ATORVASTATIN CALCIUM 20 MG PO TABS
20.0000 mg | ORAL_TABLET | Freq: Every day | ORAL | Status: DC
Start: 2023-03-27 — End: 2023-03-29
  Administered 2023-03-27 – 2023-03-28 (×2): 20 mg via ORAL
  Filled 2023-03-27 (×2): qty 1

## 2023-03-27 MED ORDER — ORAL CARE MOUTH RINSE: 15.0000 mL | Freq: Once | OROMUCOSAL | Status: AC

## 2023-03-27 MED ORDER — ROCURONIUM BROMIDE 10 MG/ML (PF) SYRINGE
PREFILLED_SYRINGE | INTRAVENOUS | Status: AC
  Filled 2023-03-27: qty 10

## 2023-03-27 MED ORDER — METOPROLOL TARTRATE 25 MG PO TABS
25.0000 mg | ORAL_TABLET | Freq: Two times a day (BID) | ORAL | Status: DC
  Administered 2023-03-27 – 2023-03-29 (×4): 25 mg via ORAL
  Filled 2023-03-27 (×4): qty 1

## 2023-03-27 MED ORDER — KETAMINE HCL 50 MG/5ML IJ SOSY
PREFILLED_SYRINGE | INTRAMUSCULAR | Status: AC
  Filled 2023-03-27: qty 5

## 2023-03-27 MED ORDER — CYCLOBENZAPRINE HCL 10 MG PO TABS
10.0000 mg | ORAL_TABLET | Freq: Three times a day (TID) | ORAL | Status: DC | PRN
  Administered 2023-03-27 – 2023-03-28 (×3): 10 mg via ORAL
  Filled 2023-03-27 (×3): qty 1

## 2023-03-27 MED ORDER — ONDANSETRON HCL 4 MG/2ML IJ SOLN: 4.0000 mg | Freq: Four times a day (QID) | INTRAMUSCULAR | Status: DC | PRN

## 2023-03-27 MED ORDER — ONDANSETRON HCL 4 MG/2ML IJ SOLN
INTRAMUSCULAR | Status: DC | PRN
  Administered 2023-03-27: 4 mg via INTRAVENOUS

## 2023-03-27 MED ORDER — PANTOPRAZOLE SODIUM 20 MG PO TBEC
20.0000 mg | DELAYED_RELEASE_TABLET | Freq: Every day | ORAL | Status: DC
  Administered 2023-03-28 – 2023-03-29 (×2): 20 mg via ORAL
  Filled 2023-03-27 (×2): qty 1

## 2023-03-27 MED ORDER — SENNOSIDES-DOCUSATE SODIUM 8.6-50 MG PO TABS: 2.0000 | ORAL_TABLET | Freq: Every day | ORAL | Status: DC

## 2023-03-27 MED ORDER — TRAMADOL HCL 50 MG PO TABS
100.0000 mg | ORAL_TABLET | Freq: Two times a day (BID) | ORAL | Status: DC | PRN
  Administered 2023-03-27: 100 mg via ORAL
  Filled 2023-03-27: qty 2

## 2023-03-27 MED ORDER — PHENYLEPHRINE HCL (PRESSORS) 10 MG/ML IV SOLN
INTRAVENOUS | Status: DC | PRN
Start: 2023-03-27 — End: 2023-03-27
  Administered 2023-03-27: 160 ug via INTRAVENOUS

## 2023-03-27 MED ORDER — PROPOFOL 10 MG/ML IV BOLUS
INTRAVENOUS | Status: AC
  Filled 2023-03-27: qty 20

## 2023-03-27 MED ORDER — BUPIVACAINE HCL 0.25 % IJ SOLN
INTRAMUSCULAR | Status: DC | PRN
  Administered 2023-03-27: 21 mL

## 2023-03-27 MED ORDER — IBUPROFEN 200 MG PO TABS
800.0000 mg | ORAL_TABLET | Freq: Four times a day (QID) | ORAL | Status: DC
  Administered 2023-03-28: 800 mg via ORAL
  Filled 2023-03-27: qty 4

## 2023-03-27 MED ORDER — LIDOCAINE HCL 2 % IJ SOLN
INTRAMUSCULAR | Status: AC
  Filled 2023-03-27: qty 20

## 2023-03-27 MED ORDER — POLYVINYL ALCOHOL 1.4 % OP SOLN
1.0000 [drp] | Freq: Every day | OPHTHALMIC | Status: DC
  Administered 2023-03-27 – 2023-03-28 (×2): 1 [drp] via OPHTHALMIC
  Filled 2023-03-27: qty 15

## 2023-03-27 MED ORDER — PROPOFOL 10 MG/ML IV BOLUS
INTRAVENOUS | Status: DC | PRN
  Administered 2023-03-27: 80 mg via INTRAVENOUS

## 2023-03-27 MED ORDER — SODIUM CHLORIDE 0.9% FLUSH
10.0000 mL | Freq: Two times a day (BID) | INTRAVENOUS | Status: DC
  Administered 2023-03-27 – 2023-03-29 (×5): 10 mL via INTRAVENOUS

## 2023-03-27 MED ORDER — CHLORHEXIDINE GLUCONATE 0.12 % MT SOLN
15.0000 mL | Freq: Once | OROMUCOSAL | Status: AC
Start: 2023-03-27 — End: 2023-03-27
  Administered 2023-03-27: 15 mL via OROMUCOSAL

## 2023-03-27 MED ORDER — DEXAMETHASONE SODIUM PHOSPHATE 10 MG/ML IJ SOLN
INTRAMUSCULAR | Status: DC | PRN
  Administered 2023-03-27: 10 mg via INTRAVENOUS

## 2023-03-27 MED ORDER — LEVOTHYROXINE SODIUM 50 MCG PO TABS
50.0000 ug | ORAL_TABLET | Freq: Every day | ORAL | Status: DC
  Administered 2023-03-28 – 2023-03-29 (×2): 50 ug via ORAL
  Filled 2023-03-27 (×2): qty 1

## 2023-03-27 MED ORDER — ENOXAPARIN SODIUM 40 MG/0.4ML IJ SOSY
40.0000 mg | PREFILLED_SYRINGE | INTRAMUSCULAR | Status: DC
  Administered 2023-03-28 – 2023-03-29 (×2): 40 mg via SUBCUTANEOUS
  Filled 2023-03-27 (×2): qty 0.4

## 2023-03-27 MED ORDER — ROCURONIUM BROMIDE 100 MG/10ML IV SOLN
INTRAVENOUS | Status: DC | PRN
  Administered 2023-03-27: 50 mg via INTRAVENOUS

## 2023-03-27 MED ORDER — GABAPENTIN 100 MG PO CAPS
100.0000 mg | ORAL_CAPSULE | Freq: Three times a day (TID) | ORAL | Status: DC
  Administered 2023-03-27 – 2023-03-29 (×5): 100 mg via ORAL
  Filled 2023-03-27 (×5): qty 1

## 2023-03-27 MED ORDER — ENOXAPARIN SODIUM 40 MG/0.4ML IJ SOSY: 40.0000 mg | PREFILLED_SYRINGE | INTRAMUSCULAR | Status: DC

## 2023-03-27 MED ORDER — DEXAMETHASONE SODIUM PHOSPHATE 10 MG/ML IJ SOLN
INTRAMUSCULAR | Status: AC
  Filled 2023-03-27: qty 1

## 2023-03-27 MED ORDER — SODIUM CHLORIDE (PF) 0.9 % IJ SOLN
INTRAMUSCULAR | Status: DC | PRN
  Administered 2023-03-27: 50 mL

## 2023-03-27 MED ORDER — OXYCODONE HCL 5 MG PO TABS
5.0000 mg | ORAL_TABLET | ORAL | Status: DC | PRN
  Administered 2023-03-27: 5 mg via ORAL
  Filled 2023-03-27: qty 1

## 2023-03-27 MED ORDER — ACETAMINOPHEN 500 MG PO TABS
1000.0000 mg | ORAL_TABLET | ORAL | Status: DC
  Filled 2023-03-27: qty 2

## 2023-03-27 MED ORDER — HYDROMORPHONE HCL 1 MG/ML IJ SOLN
0.2500 mg | INTRAMUSCULAR | Status: DC | PRN
  Administered 2023-03-27: 0.5 mg via INTRAVENOUS

## 2023-03-27 MED ORDER — ACETAMINOPHEN 500 MG PO TABS: 1000.0000 mg | ORAL_TABLET | Freq: Once | ORAL | Status: DC

## 2023-03-27 MED ORDER — NITROGLYCERIN 0.4 MG SL SUBL: 0.4000 mg | SUBLINGUAL_TABLET | SUBLINGUAL | Status: DC | PRN

## 2023-03-27 MED ORDER — SODIUM CHLORIDE 0.9% FLUSH: 3.0000 mL | Freq: Two times a day (BID) | INTRAVENOUS | Status: DC

## 2023-03-27 MED ORDER — LACTATED RINGERS IV SOLN: INTRAVENOUS | Status: DC

## 2023-03-27 MED ORDER — BUPIVACAINE HCL 0.25 % IJ SOLN
INTRAMUSCULAR | Status: AC
  Filled 2023-03-27: qty 1

## 2023-03-27 MED ORDER — HYDROMORPHONE HCL 1 MG/ML IJ SOLN
INTRAMUSCULAR | Status: AC
  Administered 2023-03-27: 0.5 mg via INTRAVENOUS
  Filled 2023-03-27: qty 2

## 2023-03-27 SURGICAL SUPPLY — 80 items
ADH SKN CLS APL DERMABOND .7 (GAUZE/BANDAGES/DRESSINGS) ×1
AGENT HMST KT MTR STRL THRMB (HEMOSTASIS)
APL ESCP 34 STRL LF DISP (HEMOSTASIS)
APPLICATOR SURGIFLO ENDO (HEMOSTASIS) IMPLANT
BAG LAPAROSCOPIC 12 15 PORT 16 (BASKET) IMPLANT
BAG RETRIEVAL 12/15 (BASKET)
BLADE SURG SZ10 CARB STEEL (BLADE) IMPLANT
COVER BACK TABLE 60X90IN (DRAPES) ×1 IMPLANT
COVER TIP SHEARS 8 DVNC (MISCELLANEOUS) ×1 IMPLANT
DERMABOND ADVANCED .7 DNX12 (GAUZE/BANDAGES/DRESSINGS) ×1 IMPLANT
DRAPE ARM DVNC X/XI (DISPOSABLE) ×4 IMPLANT
DRAPE COLUMN DVNC XI (DISPOSABLE) ×1 IMPLANT
DRAPE SHEET LG 3/4 BI-LAMINATE (DRAPES) ×1 IMPLANT
DRAPE SURG IRRIG POUCH 19X23 (DRAPES) ×1 IMPLANT
DRIVER NDL MEGA SUTCUT DVNCXI (INSTRUMENTS) ×1 IMPLANT
DRIVER NDLE MEGA SUTCUT DVNCXI (INSTRUMENTS)
DRSG OPSITE POSTOP 4X6 (GAUZE/BANDAGES/DRESSINGS) IMPLANT
DRSG OPSITE POSTOP 4X8 (GAUZE/BANDAGES/DRESSINGS) IMPLANT
ELECT PENCIL ROCKER SW 15FT (MISCELLANEOUS) IMPLANT
ELECT REM PT RETURN 15FT ADLT (MISCELLANEOUS) ×1 IMPLANT
FORCEPS BPLR FENES DVNC XI (FORCEP) ×1 IMPLANT
FORCEPS PROGRASP DVNC XI (FORCEP) ×1 IMPLANT
GAUZE 4X4 16PLY ~~LOC~~+RFID DBL (SPONGE) ×1 IMPLANT
GLOVE BIO SURGEON STRL SZ 6 (GLOVE) ×4 IMPLANT
GLOVE BIO SURGEON STRL SZ 6.5 (GLOVE) ×1 IMPLANT
GLOVE BIOGEL PI IND STRL 6.5 (GLOVE) ×2 IMPLANT
GOWN STRL REUS W/ TWL LRG LVL3 (GOWN DISPOSABLE) ×4 IMPLANT
GOWN STRL REUS W/TWL LRG LVL3 (GOWN DISPOSABLE) ×4
GRASPER SUT TROCAR 14GX15 (MISCELLANEOUS) IMPLANT
HOLDER FOLEY CATH W/STRAP (MISCELLANEOUS) IMPLANT
IRRIG SUCT STRYKERFLOW 2 WTIP (MISCELLANEOUS) ×1
IRRIGATION SUCT STRKRFLW 2 WTP (MISCELLANEOUS) ×1 IMPLANT
KIT PROCEDURE DVNC SI (MISCELLANEOUS) IMPLANT
KIT TURNOVER KIT A (KITS) IMPLANT
LIGASURE IMPACT 36 18CM CVD LR (INSTRUMENTS) IMPLANT
MANIPULATOR ADVINCU DEL 3.0 PL (MISCELLANEOUS) IMPLANT
MANIPULATOR ADVINCU DEL 3.5 PL (MISCELLANEOUS) IMPLANT
MANIPULATOR UTERINE 4.5 ZUMI (MISCELLANEOUS) IMPLANT
NDL HYPO 21X1.5 SAFETY (NEEDLE) ×1 IMPLANT
NDL INSUFFLATION 14GA 120MM (NEEDLE) IMPLANT
NDL SPNL 20GX3.5 QUINCKE YW (NEEDLE) IMPLANT
NEEDLE HYPO 21X1.5 SAFETY (NEEDLE) ×1
NEEDLE INSUFFLATION 14GA 120MM (NEEDLE)
NEEDLE SPNL 20GX3.5 QUINCKE YW (NEEDLE)
OBTURATOR OPTICAL STND 8 DVNC (TROCAR) ×1
OBTURATOR OPTICALSTD 8 DVNC (TROCAR) ×1 IMPLANT
PACK ROBOT GYN CUSTOM WL (TRAY / TRAY PROCEDURE) ×1 IMPLANT
PAD ARMBOARD 7.5X6 YLW CONV (MISCELLANEOUS) ×1 IMPLANT
PAD POSITIONING PINK XL (MISCELLANEOUS) ×1 IMPLANT
PORT ACCESS TROCAR AIRSEAL 12 (TROCAR) IMPLANT
SCISSORS MNPLR CVD DVNC XI (INSTRUMENTS) ×1 IMPLANT
SCRUB CHG 4% DYNA-HEX 4OZ (MISCELLANEOUS) ×2 IMPLANT
SEAL UNIV 5-12 XI (MISCELLANEOUS) ×4 IMPLANT
SET TRI-LUMEN FLTR TB AIRSEAL (TUBING) ×1 IMPLANT
SPIKE FLUID TRANSFER (MISCELLANEOUS) ×1 IMPLANT
SPONGE T-LAP 18X18 ~~LOC~~+RFID (SPONGE) IMPLANT
SURGIFLO W/THROMBIN 8M KIT (HEMOSTASIS) IMPLANT
SUT MNCRL AB 4-0 PS2 18 (SUTURE) IMPLANT
SUT PDS AB 1 TP1 54 (SUTURE) IMPLANT
SUT VIC AB 0 CT1 27 (SUTURE)
SUT VIC AB 0 CT1 27XBRD ANTBC (SUTURE) IMPLANT
SUT VIC AB 2-0 CT1 27 (SUTURE)
SUT VIC AB 2-0 CT1 TAPERPNT 27 (SUTURE) IMPLANT
SUT VIC AB 4-0 PS2 18 (SUTURE) ×2 IMPLANT
SUT VICRYL 0 27 CT2 27 ABS (SUTURE) ×1 IMPLANT
SUT VLOC 180 0 9IN GS21 (SUTURE) IMPLANT
SYR 10ML LL (SYRINGE) IMPLANT
SYR TOOMEY IRRIG 70ML (MISCELLANEOUS) ×1
SYRINGE TOOMEY IRRIG 70ML (MISCELLANEOUS) IMPLANT
SYS BAG RETRIEVAL 10MM (BASKET)
SYS WOUND ALEXIS 18CM MED (MISCELLANEOUS)
SYSTEM BAG RETRIEVAL 10MM (BASKET) IMPLANT
SYSTEM WOUND ALEXIS 18CM MED (MISCELLANEOUS) IMPLANT
TOWEL OR NON WOVEN STRL DISP B (DISPOSABLE) IMPLANT
TRAP SPECIMEN MUCUS 40CC (MISCELLANEOUS) IMPLANT
TRAY FOLEY MTR SLVR 16FR STAT (SET/KITS/TRAYS/PACK) ×1 IMPLANT
TROCAR PORT AIRSEAL 5X120 (TROCAR) IMPLANT
UNDERPAD 30X36 HEAVY ABSORB (UNDERPADS AND DIAPERS) ×2 IMPLANT
WATER STERILE IRR 1000ML POUR (IV SOLUTION) ×1 IMPLANT
YANKAUER SUCT BULB TIP 10FT TU (MISCELLANEOUS) IMPLANT

## 2023-03-27 NOTE — Discharge Instructions (Addendum)
AFTER SURGERY INSTRUCTIONS   Return to work: 4-6 weeks if applicable   Activity: 1. Be up and out of the bed during the day.  Take a nap if needed.  You may walk up steps but be careful and use the hand rail.  Stair climbing will tire you more than you think, you may need to stop part way and rest.    2. No lifting or straining for 6 weeks over 10 pounds. No pushing, pulling, straining for 6 weeks.   3. No driving for around 1-2 week(s) if you were cleared to drive before surgery.  Do not drive if you are taking narcotic pain medicine and make sure that your reaction time has returned.    4. You can shower as soon as the next day after surgery. Shower daily.  Use your regular soap and water (not directly on the incision) and pat your incision(s) dry afterwards; don't rub.  No tub baths or submerging your body in water until cleared by your surgeon. If you have the soap that was given to you by pre-surgical testing that was used before surgery, you do not need to use it afterwards because this can irritate your incisions.    5. No sexual activity and nothing in the vagina for 4-6 weeks.   6. You may experience a small amount of clear drainage from your incisions, which is normal.  If the drainage persists, increases, or changes color please call the office.   7. Do not use creams, lotions, or ointments such as neosporin on your incisions after surgery until advised by your surgeon because they can cause removal of the dermabond glue on your incisions.     8. Take Tylenol first for pain if you are able to take these medication and only use narcotic pain medication for severe pain not relieved by the Tylenol.  Monitor your Tylenol intake to a max of 4,000 mg in a 24 hour period.    Diet: 1. Low sodium Heart Healthy Diet is recommended but you are cleared to resume your normal (before surgery) diet after your procedure.   2. It is safe to use a laxative, such as Miralax or Colace, if you have  difficulty moving your bowels. You have been prescribed Sennakot-S to take at bedtime every evening after surgery to keep bowel movements regular and to prevent constipation.     Wound Care: 1. Keep clean and dry.  Shower daily.   Reasons to call the Doctor: Fever - Oral temperature greater than 100.4 degrees Fahrenheit Foul-smelling vaginal discharge Difficulty urinating Nausea and vomiting Increased pain at the site of the incision that is unrelieved with pain medicine. Difficulty breathing with or without chest pain New calf pain especially if only on one side Sudden, continuing increased vaginal bleeding with or without clots.   Contacts: For questions or concerns you should contact:   Dr. Clide Cliff at [phone]   Warner Mccreedy, NP at [phone]   After Hours: call [phone] and have the GYN Oncologist paged/contacted (after 5 pm or on the weekends). You will speak with an after hours RN and let he or she know you have had surgery.   Messages sent via mychart are for non-urgent matters and are not responded to after hours so for urgent needs, please call the after hours number.

## 2023-03-27 NOTE — Transfer of Care (Signed)
Immediate Anesthesia Transfer of Care Note  Patient: Amber [name]  Procedure(s) Performed: XI ROBOTIC ASSISTED RIGHT OOPHORECTOMY (Bilateral)  Patient Location: PACU  Anesthesia Type:General  Level of Consciousness: sedated and responds to stimulation  Airway & Oxygen Therapy: Patient Spontanous Breathing and Patient connected to face mask oxygen  Post-op Assessment: Report given to RN and Post -op Vital signs reviewed and stable  Post vital signs: Reviewed and stable  Last Vitals:  Vitals Value Taken Time  BP 164/71 03/27/23 1331  Temp 36.3 C 03/27/23 1330  Pulse 76 03/27/23 1332  Resp 11 03/27/23 1332  SpO2 100 % 03/27/23 1332  Vitals shown include unfiled device data.  Last Pain:  Vitals:   03/27/23 1330  TempSrc:   PainSc: Asleep         Complications: No notable events documented.

## 2023-03-27 NOTE — Op Note (Signed)
GYNECOLOGIC ONCOLOGY OPERATIVE NOTE  Date of Service: 03/27/2023  Preoperative Diagnosis: Complex adnexal mass, elevated tumor marker  Postoperative Diagnosis: Right ovarian mass  Procedures: Robotic assisted right oophorectomy, adhesiolysis (Modifer 22: increased duration of the procedure by >70min due to complexity due adhesive disease requiring meticulous lysis of adhesions, necessitating additional instrumentation for retraction and safe exposure)  Surgeon: Clide Cliff, MD  Assistants: Antionette Char, MD and (an MD assistant was necessary for tissue manipulation, management of robotic instrumentation, retraction and positioning due to the complexity of the case and hospital policies)  Anesthesia: General  Estimated Blood Loss: 25 ml    Fluids: 1000 ml, crystalloid  Urine Output: 150 ml, clear yellow  Findings: On entry to abdomen, normal upper abdominal survey with smooth diaphragm, liver, stomach and normal appearing omentum and bowel.  Small adhesion of omentum to anterior abdominal wall in midline near umbilicus.  Surgically absent uterus.  Enlarged cystic right adnexal mass, partially retroperitonealized and adherent to the right pelvic sidewall.  Cecum and rectosigmoid colon adherent and overlying this mass.  Left ovary atrophic and densely adherent to the left pelvic sidewall with dense adhesions to the rectosigmoid colon.  Given benign IOFS of the right ovary and otherwise normal atrophic appearance of left ovary, decision made to defer left oophorectomy to minimize risk of injury to bowel.   Specimens:  ID Type Source Tests Collected by Time Destination  1 : RIGHT OVARY Tissue PATH Soft tissue SURGICAL PATHOLOGY Clide Cliff, MD 03/27/2023 1205   A : PELVIC WASHINGS Body Fluid PATH Cytology Pelvic Washing CYTOLOGY - NON PAP Clide Cliff, MD 03/27/2023 1149     Complications:  None  Indications for Procedure: Amber [name] is a 82 y.o. woman with an  enlarged complex adnexal mass with elevated HE4.  Prior to the procedure, all risks, benefits, and alternatives were discussed and informed surgical consent was signed.  Procedure: Patient was taken to the operating room where general anesthesia was achieved.  She was positioned in dorsal lithotomy and prepped and draped.  A foley catheter was inserted into the bladder.  A sponge stick was placed in the vagina.  A 12 mm incision was made in the left upper quadrant near Palmer's point.  The abdomen was entered with a 5 mm OptiView trocar under direct visualization.  The abdomen was insufflated, the patient placed in steep Trendelenburg, and additional trocars were placed as follows: an 8mm robotic trocar superior to the umbilicus, one 8 mm robotic trocar in the right abdomen, and one 8 mm robotic trocar in the left abdomen.  The left upper quadrant trocar was removed and replaced with a 12 mm airseal trocar.  All trocars were placed under direct visualization.  The adhesion of the omentum to the intra-abdominal wall was lysed sharply.  The bowels were moved into the upper abdomen.  The DaVinci robotic surgical system was brought to the patient's bedside and docked.  Pelvic washings were obtained.  Adhesions of the sigmoid colon to the right Nexa mass were lysed serially with sharp dissection with cold scissors.  The right retroperitoneum was then entered sharply.  The remnant right lateral ligament was identified, cauterized, and transected.  The retroperitoneum was further developed and the right ureter was identified.  The adnexa was mobilized off of the right sidewall.  The right infundibulopelvic ligament was isolated, cauterized, transected.  Additional adhesions of the mass to the right pelvic sidewall were serially lysed with a combination of blunt and sharp dissection.  Adhesions of the rectosigmoid colon to the mass were also identified and lysed sharply.  The final adhesions of the mass to the right  apex of the cuff were then sharply lysed and the mass was fully mobilized.  The mass was placed in an Endo Catch bag and brought up through the left upper quadrant incision.  The mass was ruptured within the bag.  The specimen was removed in the bag and sent for frozen pathology.  At this time, attention was turned to the left.  Adhesions of the rectosigmoid colon to the left pelvic sidewall were lysed.  Initially the left adnexa was not visualized.  With the rectosigmoid colon mobilized, the left ovary could be better visualized dense adherent to the left pelvic sidewall and the rectosigmoid colon.  The ovary was normal and atrophic in appearance.  At this time, frozen pathology returned benign, and decision was made to defer left oophorectomy to minimize risk of injury to surrounding structures given dense adhesions and otherwise normal appearance of the left ovary.  The pelvis was irrigated and all operative sites were found to be hemostatic.  All instruments were removed and the robot was taken from the patient's bedside. The fascia at the 12 mm incision was closed with 0 Vicryl with the assistance of a PMI device. The abdomen was desufflated and all ports were removed. The skin at all incisions was closed with 4-0 Vicryl to reapproximate the subcutaneous tissue and 4-0 monocryl in a subcuticular fashion followed by surgical glue.  Patient tolerated the procedure well. Sponge, lap, and instrument counts were correct.  No perioperative antibiotic prophylaxis was indicated for this procedure.  She was extubated and taken to the PACU in stable condition.  Clide Cliff, MD Gynecologic Oncology

## 2023-03-27 NOTE — Anesthesia Postprocedure Evaluation (Signed)
Anesthesia Post Note  Patient: Amber [name]  Procedure(s) Performed: XI ROBOTIC ASSISTED RIGHT OOPHORECTOMY (Bilateral)     Patient location during evaluation: PACU Anesthesia Type: General Level of consciousness: awake Pain management: pain level controlled Vital Signs Assessment: post-procedure vital signs reviewed and stable Respiratory status: spontaneous breathing Cardiovascular status: stable Postop Assessment: no apparent nausea or vomiting Anesthetic complications: no  No notable events documented.  Last Vitals:  Vitals:   03/27/23 1500 03/27/23 1527  BP: (!) 148/55 139/69  Pulse: 75 82  Resp: 12 16  Temp:  (!) 36.4 C  SpO2: 93% 97%    Last Pain:  Vitals:   03/27/23 1527  TempSrc: Oral  PainSc:                  [name]

## 2023-03-27 NOTE — Plan of Care (Signed)
 [facility] Tonsillectomy/Adenoidectomy, Postoperative PEDS care plan entered in error.

## 2023-03-27 NOTE — Anesthesia Procedure Notes (Signed)
Procedure Name: Intubation Date/Time: 03/27/2023 11:21 AM  Performed by: Chinita Pester, CRNAPre-anesthesia Checklist: Patient identified, Emergency Drugs available, Suction available and Patient being monitored Patient Re-evaluated:Patient Re-evaluated prior to induction Oxygen Delivery Method: Circle System Utilized Preoxygenation: Pre-oxygenation with 100% oxygen Induction Type: IV induction Ventilation: Mask ventilation without difficulty Laryngoscope Size: Glidescope and 3 Grade View: Grade I Tube type: Oral Tube size: 7.0 mm Number of attempts: 1 Airway Equipment and Method: Stylet and Oral airway Placement Confirmation: ETT inserted through vocal cords under direct vision, positive ETCO2 and breath sounds checked- equal and bilateral Secured at: 20 cm Tube secured with: Tape Dental Injury: Teeth and Oropharynx as per pre-operative assessment  Comments: Pt with positioning halfway down the bed, unable to tolerate being flat, glidescope used 2/2 pt positioning and comfort of CRNA

## 2023-03-27 NOTE — Interval H&P Note (Signed)
History and Physical Interval Note:  03/27/2023 10:48 AM  Amber [name]  has presented today for surgery, with the diagnosis of ADNEXAL MASS.  The various methods of treatment have been discussed with the patient and family. After consideration of risks, benefits and other options for treatment, the patient has consented to  Procedure(s): XI ROBOTIC ASSISTED SALPINGO OOPHORECTOMY; POSSIBLE STAGING; POSSIBLE MINI LAPAROTOMY (Bilateral) as a surgical intervention.  The patient's history has been reviewed, patient examined, no change in status, stable for surgery.  I have reviewed the patient's chart and labs.  Questions were answered to the patient's satisfaction.     Gorge Almanza

## 2023-03-28 ENCOUNTER — Encounter (HOSPITAL_COMMUNITY): Payer: Self-pay | Admitting: Psychiatry

## 2023-03-28 DIAGNOSIS — N83291 Other ovarian cyst, right side: Secondary | ICD-10-CM | POA: Diagnosis not present

## 2023-03-28 LAB — SURGICAL PATHOLOGY

## 2023-03-28 LAB — CBC
HCT: 33.3 % — ABNORMAL LOW (ref 36.0–46.0)
Hemoglobin: 11.6 g/dL — ABNORMAL LOW (ref 12.0–15.0)
MCH: 32.3 pg (ref 26.0–34.0)
MCHC: 34.8 g/dL (ref 30.0–36.0)
MCV: 92.8 fL (ref 80.0–100.0)
Platelets: 169 10*3/uL (ref 150–400)
RBC: 3.59 MIL/uL — ABNORMAL LOW (ref 3.87–5.11)
RDW: 13.3 % (ref 11.5–15.5)
WBC: 11.7 10*3/uL — ABNORMAL HIGH (ref 4.0–10.5)
nRBC: 0 % (ref 0.0–0.2)

## 2023-03-28 LAB — BASIC METABOLIC PANEL
Anion gap: 8 (ref 5–15)
BUN: 13 mg/dL (ref 8–23)
CO2: 24 mmol/L (ref 22–32)
Calcium: 8.9 mg/dL (ref 8.9–10.3)
Chloride: 101 mmol/L (ref 98–111)
Creatinine, Ser: 0.53 mg/dL (ref 0.44–1.00)
GFR, Estimated: >60 mL/min (ref >60)
Glucose, Bld: 128 mg/dL — ABNORMAL HIGH (ref 70–99)
Potassium: 4 mmol/L (ref 3.5–5.1)
Sodium: 133 mmol/L — ABNORMAL LOW (ref 135–145)

## 2023-03-28 LAB — CYTOLOGY - NON PAP

## 2023-03-28 MED ORDER — IBUPROFEN 400 MG PO TABS
800.0000 mg | ORAL_TABLET | Freq: Three times a day (TID) | ORAL | Status: DC
  Administered 2023-03-28 – 2023-03-29 (×4): 800 mg via ORAL
  Filled 2023-03-28 (×4): qty 2

## 2023-03-28 MED ORDER — TRAMADOL HCL 50 MG PO TABS
50.0000 mg | ORAL_TABLET | Freq: Two times a day (BID) | ORAL | Status: DC | PRN
  Administered 2023-03-28: 100 mg via ORAL
  Administered 2023-03-29 (×2): 50 mg via ORAL
  Filled 2023-03-28: qty 1
  Filled 2023-03-28: qty 2
  Filled 2023-03-28: qty 1

## 2023-03-28 NOTE — TOC Initial Note (Signed)
Transition of Care [facility]) - Initial/Assessment Note   Patient Details  Name: Amber [name] MRN: [id number] Date of Birth: July 07, 1940  Transition of Care [facility]) CM/SW Contact:    Ewing Schlein, LCSW Phone Number: 03/28/2023, 3:12 PM  Clinical Narrative: PT evaluation recommended HHPT. Patient is from [facility] and reported there is no PT in-house at the facility, so she would like to use the previous [facility] agency she used. CSW spoke with daughter-in-law, Aleidy Bicker, and confirmed patient used [facility] the past. CSW made [facility] referral to Cindie with [facility], which was accepted. CSW spoke with [facility] and an [facility] and discharge summary will need to be faxed to [phone] prior to patient's return.  Expected Discharge Plan: Assisted Living Barriers to Discharge: Continued Medical Work up  Patient Goals and CMS Choice Patient states their goals for this hospitalization and ongoing recovery are:: Return to [facility] [email or web address] Compare Post Acute Care list provided to:: Patient Choice offered to / list presented to : Patient  Expected Discharge Plan and Services In-house Referral: Clinical Social Work Post Acute Care Choice: Home Health Living arrangements for the past 2 months: Assisted Living Facility           DME Arranged: N/A DME Agency: NA HH Arranged: PT HH Agency: [facility] Date [facility] Agency Contacted: 03/28/23 Time HH Agency Contacted: [id number] Representative spoke with at [facility] Agency: Cindie  Prior Living Arrangements/Services Living arrangements for the past 2 months: Assisted Living Facility Lives with:: Facility Resident Patient language and need for interpreter reviewed:: Yes Need for Family Participation in Patient Care: Yes (Comment) Care giver support system in place?: Yes (comment) Criminal Activity/Legal Involvement Pertinent to Current Situation/Hospitalization: No - Comment as needed  Activities of Daily Living ADL Screening (condition at  time of admission) Independently performs ADLs?: Yes (appropriate for developmental age) Is the patient deaf or have difficulty hearing?: No Does the patient have difficulty seeing, even when wearing glasses/contacts?: No Does the patient have difficulty concentrating, remembering, or making decisions?: No  Permission Sought/Granted Permission sought to share information with : Facility Medical sales representative, Other (comment) Permission granted to share information with : Yes, Verbal Permission Granted Share Information with NAME: [facility] Permission granted to share info w AGENCY: [facility]  Emotional Assessment Appearance:: Appears stated age Attitude/Demeanor/Rapport: Engaged Affect (typically observed): Accepting Orientation: : Oriented to Self, Oriented to Place, Oriented to  Time, Oriented to Situation Alcohol / Substance Use: Not Applicable Psych Involvement: No (comment)  Admission diagnosis:  Adnexal mass [N94.89] Patient Active Problem List   Diagnosis Date Noted   Adnexal mass 03/27/2023   Leukocytosis 04/16/2022   Septic shock (HCC) 04/16/2022   Hypoalbuminemia 04/16/2022   E coli bacteremia 04/16/2022   Coronary artery disease 04/16/2022   Abnormal LFTs 04/16/2022   PAD (peripheral artery disease) (HCC) 04/16/2022   Chronic back pain 04/16/2022   Lumbar radiculopathy 04/16/2022   Hip pain 04/16/2022   Constipation 04/16/2022   Obesity (BMI 30-39.9) 04/16/2022   Chest pain of uncertain etiology 04/10/2022   NSTEMI (non-ST elevated myocardial infarction) (HCC) 04/09/2022   E. coli UTI (urinary tract infection) 04/08/2022   Severe sepsis (HCC) 04/08/2022   Transient hypotension 04/08/2022   Acute metabolic encephalopathy 04/08/2022   Cellulitis, face 04/08/2022   Elevated troponin 04/08/2022   AKI (acute kidney injury) (HCC) 04/08/2022   Normocytic anemia 04/08/2022   Thrombocytopenia (HCC) 04/08/2022   Sepsis (HCC) 04/08/2022   Lumbosacral radiculopathy at  S1 12/06/2020  Hyponatremia 12/06/2020   Mixed hyperlipidemia 12/06/2020   Osteopenia after menopause 12/06/2020   Class 1 obesity due to excess calories in adult 03/29/2020   Prediabetes 03/18/2020   Laryngopharyngeal reflux (LPR) 03/11/2019   Gastroesophageal reflux disease 01/19/2016   Hoarseness, chronic 12/28/2015   Complete left bundle branch block 12/08/2015   Essential hypertension 12/08/2015   Acquired hypothyroidism 12/08/2015   Peripheral vascular disease, unspecified (HCC) 04/18/2013   Chronic venous insufficiency 04/18/2013   PCP:  Cathlean [place]se[place] Of Pharmacy:   [id number] - [place], [place] - [address] AT [facility] OF [facility] [address] & [facility] [address] [place] [address] Phone: [phone] Fax: [phone]  [facility] - [place], [place] - [address] [place] [address] Phone: [phone] Fax: [phone]  [place], [place] - [address] [place]  [place] [address] Phone: [phone] Fax: [phone]  Social Determinants of Health (SDOH) Social History: SDOH Screenings   Food Insecurity: No Food Insecurity (03/27/2023)  Housing: Low Risk  (03/27/2023)  Transportation Needs: No Transportation Needs (03/27/2023)  Utilities: Not At Risk (03/27/2023)  Depression (PHQ2-9): Low Risk  (09/06/2022)  Financial Resource Strain: Low Risk  (11/08/2021)  Physical Activity: Inactive (11/08/2021)  Social Connections: Moderately Integrated (11/08/2021)  Stress: No Stress Concern Present (11/08/2021)  Tobacco Use: Medium Risk (03/27/2023)   SDOH Interventions:    Readmission Risk Interventions     No data to display

## 2023-03-28 NOTE — Plan of Care (Signed)
  Problem: Education: Goal: Understanding of CV disease, CV risk reduction, and recovery process will improve Outcome: Progressing Goal: Individualized Educational Video(s) Outcome: Progressing   Problem: Activity: Goal: Ability to return to baseline activity level will improve Outcome: Progressing   Problem: Cardiovascular: Goal: Ability to achieve and maintain adequate cardiovascular perfusion will improve Outcome: Progressing Goal: Vascular access site(s) Level 0-1 will be maintained Outcome: Progressing   Problem: Health Behavior/Discharge Planning: Goal: Ability to safely manage health-related needs after discharge will improve Outcome: Progressing   Problem: Education: Goal: Knowledge of General Education information will improve Description: Including pain rating scale, medication(s)/side effects and non-pharmacologic comfort measures Outcome: Progressing   Problem: Health Behavior/Discharge Planning: Goal: Ability to manage health-related needs will improve Outcome: Progressing   Problem: Clinical Measurements: Goal: Ability to maintain clinical measurements within normal limits will improve Outcome: Progressing Goal: Will remain free from infection Outcome: Progressing Goal: Diagnostic test results will improve Outcome: Progressing Goal: Respiratory complications will improve Outcome: Progressing Goal: Cardiovascular complication will be avoided Outcome: Progressing   Problem: Activity: Goal: Risk for activity intolerance will decrease Outcome: Progressing   Problem: Nutrition: Goal: Adequate nutrition will be maintained Outcome: Progressing   Problem: Coping: Goal: Level of anxiety will decrease Outcome: Progressing   Problem: Elimination: Goal: Will not experience complications related to bowel motility Outcome: Progressing Goal: Will not experience complications related to urinary retention Outcome: Progressing   Problem: Pain Management: Goal:  General experience of comfort will improve Outcome: Progressing   Problem: Safety: Goal: Ability to remain free from injury will improve Outcome: Progressing   Problem: Skin Integrity: Goal: Risk for impaired skin integrity will decrease Outcome: Progressing   Problem: Education: Goal: Knowledge of the prescribed therapeutic regimen will improve Outcome: Progressing Goal: Understanding of sexual limitations or changes related to disease process or condition will improve Outcome: Progressing Goal: Individualized Educational Video(s) Outcome: Progressing   Problem: Self-Concept: Goal: Communication of feelings regarding changes in body function or appearance will improve Outcome: Progressing   Problem: Skin Integrity: Goal: Demonstration of wound healing without infection will improve Outcome: Progressing

## 2023-03-28 NOTE — Evaluation (Addendum)
Physical Therapy Evaluation Patient Details Name: Amber [name] MRN: [id number] DOB: 1940-10-11 Today's Date: 03/28/2023  History of Present Illness  82 yo female s/p oophorectomy 03/27/23. Hx of bil shoulder surgery, back surgery, TKA, chronic pain, macular degeneration, DVT, LBBB, OA  Clinical Impression  On eval, pt required Min A for mobility. She walked ~75 feet with a RW. Mobility is limited by pain. Pt presents with general weakness, decreased activity tolerance, and impaired gait and balance. Pt is from [facility]-will need to confirm with ALF that they can provide current level of assistance upon patient's return-recommend TOC consult. Also will place OT consult for ADL evaluation. Pt reports she has had issues with pain control-she is concerned about this. Feel pt may benefit from remaining in hospital, at least overnight, to continue working with therapies. Recommend HHPT f/u at this time. If ALF staff is unable to provide current level of assist, may need to consider ST SNF rehab.       If plan is discharge home, recommend the following: A little help with walking and/or transfers;A little help with bathing/dressing/bathroom;Assistance with cooking/housework;Assist for transportation;Help with stairs or ramp for entrance   Can travel by private vehicle        Equipment Recommendations None recommended by PT  Recommendations for Other Services  OT consult    Functional Status Assessment Patient has had a recent decline in their functional status and demonstrates the ability to make significant improvements in function in a reasonable and predictable amount of time.     Precautions / Restrictions Precautions Precautions: Fall Precaution Comments: abd surgery Restrictions Weight Bearing Restrictions: No      Mobility  Bed Mobility Overal bed mobility: Needs Assistance Bed Mobility: Supine to Sit     Supine to sit: Contact guard, HOB elevated, Used rails      General bed mobility comments: HOB elevated and heavy use of bedrails. Pt did not feel she could tolerate rolling. She reports she sleeps in a recliner at baseline    Transfers Overall transfer level: Needs assistance Equipment used: Rolling walker (2 wheels) Transfers: Sit to/from Stand Sit to Stand: Min assist           General transfer comment: Assist to rise, steady, control descent. Cues for safety, hand placement. Increased time.    Ambulation/Gait Ambulation/Gait assistance: Min assist Gait Distance (Feet): 75 Feet Assistive device: Rolling walker (2 wheels) Gait Pattern/deviations: Step-through pattern, Decreased stride length       General Gait Details: Assist to stabilize intermittently throughout distance. Slow gait speed. Gait is a bit effortful 2* pain.  Stairs            Wheelchair Mobility     Tilt Bed    Modified Rankin (Stroke Patients Only)       Balance Overall balance assessment: Needs assistance         Standing balance support: Bilateral upper extremity supported, During functional activity Standing balance-Leahy Scale: Poor                               Pertinent Vitals/Pain Pain Assessment Pain Assessment: 0-10 Pain Score: 8  Pain Location: abdomen Pain Descriptors / Indicators: Operative site guarding, Grimacing Pain Intervention(s): Limited activity within patient's tolerance, Monitored during session, Premedicated before session, Repositioned    Home Living Family/patient expects to be discharged to:: Assisted living   Available Help at Discharge: Personal care attendant (T/TH)  Home Access: Level entry       Home Layout: One level Home Equipment: Agricultural consultant (2 wheels);Shower seat;Grab bars - toilet;Grab bars - tub/shower      Prior Function Prior Level of Function : Needs assist             Mobility Comments: uses walker. walks to dining room ADLs Comments: assist with meds and ADLs as  needed     Extremity/Trunk Assessment   Upper Extremity Assessment Upper Extremity Assessment: Defer to OT evaluation    Lower Extremity Assessment Lower Extremity Assessment: Generalized weakness    Cervical / Trunk Assessment Cervical / Trunk Assessment: Normal  Communication   Communication Communication: No apparent difficulties  Cognition Arousal: Alert Behavior During Therapy: WFL for tasks assessed/performed Overall Cognitive Status: Within Functional Limits for tasks assessed                                          General Comments      Exercises     Assessment/Plan    PT Assessment Patient needs continued PT services  PT Problem List Decreased strength;Decreased range of motion;Decreased activity tolerance;Decreased balance;Decreased mobility;Decreased knowledge of use of DME;Pain       PT Treatment Interventions DME instruction;Gait training;Functional mobility training;Therapeutic activities;Therapeutic exercise;Balance training;Patient/family education    PT Goals (Current goals can be found in the Care Plan section)  Acute Rehab PT Goals Patient Stated Goal: better pain control PT Goal Formulation: With patient Time For Goal Achievement: 04/11/23 Potential to Achieve Goals: Good    Frequency Min 1X/week     Co-evaluation               AM-PAC PT "6 Clicks" Mobility  Outcome Measure Help needed turning from your back to your side while in a flat bed without using bedrails?: A Little Help needed moving from lying on your back to sitting on the side of a flat bed without using bedrails?: A Little Help needed moving to and from a bed to a chair (including a wheelchair)?: A Little Help needed standing up from a chair using your arms (e.g., wheelchair or bedside chair)?: A Little Help needed to walk in hospital room?: A Little Help needed climbing 3-5 steps with a railing? : A Lot 6 Click Score: 17    End of Session  Equipment Utilized During Treatment: Gait belt Activity Tolerance: Patient limited by pain;Patient limited by fatigue Patient left: in chair;with call bell/phone within reach   PT Visit Diagnosis: Pain;Difficulty in walking, not elsewhere classified (R26.2) Pain - part of body:  (abdomen)    Time: 9528-4132 PT Time Calculation (min) (ACUTE ONLY): 25 min   Charges:   PT Evaluation $PT Eval Low Complexity: 1 Low PT Treatments $Gait Training: 8-22 mins PT General Charges $$ ACUTE PT VISIT: 1 Visit            Faye Ramsay, PT Acute Rehabilitation  Office: [phone]

## 2023-03-28 NOTE — Progress Notes (Deleted)
   03/28/23 0851  TOC Brief Assessment  Insurance and Status Reviewed  Patient has primary care physician Yes  Home environment has been reviewed Resides at [facility]  Prior level of function: Able to adequately complete ADLs  Prior/Current Home Services Current home services (Patient has an aide per nursing assessment.)  Social Determinants of Health Reivew SDOH reviewed no interventions necessary  Readmission risk has been reviewed Yes  Transition of care needs no transition of care needs at this time

## 2023-03-28 NOTE — Progress Notes (Addendum)
1 Day Post-Op Procedure(s) (LRB): XI ROBOTIC ASSISTED RIGHT OOPHORECTOMY (Bilateral)  Subjective: Patient reports having moderate soreness in around the left upper abdominal incision. Has been using ice in this area. Tolerating sipping on liquids. No nausea or emesis. No flatus reported. Having challenges with mobility post-op. Concerned about this at discharge. Having to have moderate help to get out of bed etc. Voiding without difficulty. Denies chest pain, dyspnea. No IS in room, obtained for patient and patient instructed on use. SCD machine not working overnight.   Objective: Vital signs in last 24 hours: Temp:  [97.4 F (36.3 C)-98.2 F (36.8 C)] 97.8 F (36.6 C) (10/30 0522) Pulse Rate:  [64-93] 77 (10/30 0859) Resp:  [12-18] 14 (10/30 0522) BP: (129-188)/(52-72) 177/70 (10/30 0859) SpO2:  [93 %-100 %] 97 % (10/30 0522) Weight:  [167 lb (75.8 kg)] 167 lb (75.8 kg) (10/29 0923) Last BM Date : 03/26/23  Intake/Output from previous day: 10/29 0701 - 10/30 0700 In: 1600 [P.O.:600; I.V.:1000] Out: 1480 [Urine:1350; Blood:130]  Physical Examination: General: alert, cooperative, and no distress Resp: clear to auscultation bilaterally Cardio: regular rate and rhythm, S1, S2 normal, no murmur, click, rub or gallop GI: soft, non-tender; bowel sounds normal; no masses,  no organomegaly and incision: lap sites to the abdomen with dermabond without active drainage Extremities: extremities normal, atraumatic, no cyanosis or edema SCD wraps adjusted, machine not working  Labs: WBC/Hgb/Hct/Plts:  11.7/11.6/33.3/169 (10/30 0510) BUN/Cr/glu/ALT/AST/amyl/lip:  13/0.53/--/--/--/--/-- (10/30 0510)  Assessment: 82 y.o. s/p Procedure(s): XI ROBOTIC ASSISTED RIGHT OOPHORECTOMY: stable Pain:  Pain is well-controlled on PRN medications.  Heme: Hgb 11.6 and Hct 33.3 this am. Appropriate given preop values and surgical losses.  ID: WBC 11.7. Given decadron intra-op.   CV: BP and HR stable.  Continue to monitor with ordered vital signs.  GI:  Tolerating po: sips of liquids. Has ordered breakfast. Antiemetics ordered as needed.  GU: Voiding without difficulty. Creatinine 0.53 this am.     FEN: No critical values on am labs.  Prophylaxis: SCDs and lovenox  Plan: Diet as tolerated, heart healthy PT consultation Continue with plan of care, possible discharge later today or in the am if meeting milestones   LOS: 0 days    Shana Younge D Arionne Iams 03/28/2023, 9:00 AM

## 2023-03-28 NOTE — Care Management Obs Status (Signed)
MEDICARE OBSERVATION STATUS NOTIFICATION   Patient Details  Name: Amber [name] MRN: [id number] Date of Birth: 1941/03/22   Medicare Observation Status Notification Given:  Yes    Ewing Schlein, LCSW 03/28/2023, 3:50 PM

## 2023-03-28 NOTE — Progress Notes (Signed)
GYN Oncology Progress Note  Patient seen with Dr. Tamela Oddi. Patient resting in bed, oriented, in no acute distress. Patient is currently rating pain at a level 8 in the left abdomen near the lateral incision. No abnormalities noted at this incision. Abdomen is soft, no ecchymosis or active drainage with any of the incisions. Mild tenderness with palpation. She is also concerned about her BP being 120/50. No symptoms of hypotension reported. She is concerned about going home because she states it can take 20-30 minutes for a tech to come and provide assistance to the bathroom in the assisted living area. She is slower getting in and out of bed and needs assistance.   Will have BP repeated. PT to work with patient today. Order placed to give flexeril now. Heating pad to the room. Will work on improving pain control. Will plan for discharge back to assisted living this afternoon if pain improved per Dr. Tamela Oddi. If persistent pain not relieved with interventions, could consider CT imaging.

## 2023-03-28 NOTE — Progress Notes (Signed)
GYN Oncology Progress Note  Patient is alert, oriented, in no acute distress, sitting in the chair. She is tolerating her diet with no nausea or emesis. Reports left abdominal pain as improving but continues to have moderate pain with movement that affects her mobility. She currently feels loopy from the medications. She walked in the hall with the walker and PT assist. No flatus. Emptying her bladder without difficulty. Reports hx of chronic UTIs. Using heating pad to abdomen. Discussed placing an abdominal binder.  Per PT recommendation, "feel pt may benefit from remaining in hospital, at least overnight, to continue working with therapies. Recommend HHPT f/u at this time." OT has been ordered by PT. SW to arrange for HHPT. Orders placed. Patient continues to voice concern about pain and mobility.

## 2023-03-29 DIAGNOSIS — N83291 Other ovarian cyst, right side: Secondary | ICD-10-CM | POA: Diagnosis not present

## 2023-03-29 LAB — BASIC METABOLIC PANEL
Anion gap: 7 (ref 5–15)
BUN: 25 mg/dL — ABNORMAL HIGH (ref 8–23)
CO2: 24 mmol/L (ref 22–32)
Calcium: 8.4 mg/dL — ABNORMAL LOW (ref 8.9–10.3)
Chloride: 102 mmol/L (ref 98–111)
Creatinine, Ser: 0.9 mg/dL (ref 0.44–1.00)
GFR, Estimated: >60 mL/min (ref >60)
Glucose, Bld: 99 mg/dL (ref 70–99)
Potassium: 3.7 mmol/L (ref 3.5–5.1)
Sodium: 133 mmol/L — ABNORMAL LOW (ref 135–145)

## 2023-03-29 LAB — CBC
HCT: 30.8 % — ABNORMAL LOW (ref 36.0–46.0)
Hemoglobin: 10.4 g/dL — ABNORMAL LOW (ref 12.0–15.0)
MCH: 32.2 pg (ref 26.0–34.0)
MCHC: 33.8 g/dL (ref 30.0–36.0)
MCV: 95.4 fL (ref 80.0–100.0)
Platelets: 156 10*3/uL (ref 150–400)
RBC: 3.23 MIL/uL — ABNORMAL LOW (ref 3.87–5.11)
RDW: 13.8 % (ref 11.5–15.5)
WBC: 10.5 10*3/uL (ref 4.0–10.5)
nRBC: 0 % (ref 0.0–0.2)

## 2023-03-29 MED ORDER — OXYCODONE HCL 5 MG PO TABS: 5.0000 mg | ORAL_TABLET | ORAL | Status: DC | PRN

## 2023-03-29 NOTE — Plan of Care (Signed)
  Problem: Education: Goal: Understanding of CV disease, CV risk reduction, and recovery process will improve Outcome: Adequate for Discharge Goal: Individualized Educational Video(s) Outcome: Adequate for Discharge   Problem: Activity: Goal: Ability to return to baseline activity level will improve Outcome: Adequate for Discharge   Problem: Cardiovascular: Goal: Ability to achieve and maintain adequate cardiovascular perfusion will improve Outcome: Adequate for Discharge Goal: Vascular access site(s) Level 0-1 will be maintained Outcome: Adequate for Discharge   Problem: Health Behavior/Discharge Planning: Goal: Ability to safely manage health-related needs after discharge will improve Outcome: Adequate for Discharge   Problem: Education: Goal: Knowledge of General Education information will improve Description: Including pain rating scale, medication(s)/side effects and non-pharmacologic comfort measures Outcome: Adequate for Discharge   Problem: Health Behavior/Discharge Planning: Goal: Ability to manage health-related needs will improve Outcome: Adequate for Discharge   Problem: Clinical Measurements: Goal: Ability to maintain clinical measurements within normal limits will improve Outcome: Adequate for Discharge Goal: Will remain free from infection Outcome: Adequate for Discharge Goal: Diagnostic test results will improve Outcome: Adequate for Discharge Goal: Respiratory complications will improve Outcome: Adequate for Discharge Goal: Cardiovascular complication will be avoided Outcome: Adequate for Discharge   Problem: Activity: Goal: Risk for activity intolerance will decrease Outcome: Adequate for Discharge   Problem: Nutrition: Goal: Adequate nutrition will be maintained Outcome: Adequate for Discharge   Problem: Coping: Goal: Level of anxiety will decrease Outcome: Adequate for Discharge   Problem: Elimination: Goal: Will not experience complications  related to bowel motility Outcome: Adequate for Discharge Goal: Will not experience complications related to urinary retention Outcome: Adequate for Discharge   Problem: Pain Management: Goal: General experience of comfort will improve Outcome: Adequate for Discharge   Problem: Safety: Goal: Ability to remain free from injury will improve Outcome: Adequate for Discharge   Problem: Skin Integrity: Goal: Risk for impaired skin integrity will decrease Outcome: Adequate for Discharge   Problem: Education: Goal: Knowledge of the prescribed therapeutic regimen will improve Outcome: Adequate for Discharge Goal: Understanding of sexual limitations or changes related to disease process or condition will improve Outcome: Adequate for Discharge Goal: Individualized Educational Video(s) Outcome: Adequate for Discharge   Problem: Self-Concept: Goal: Communication of feelings regarding changes in body function or appearance will improve Outcome: Adequate for Discharge   Problem: Skin Integrity: Goal: Demonstration of wound healing without infection will improve Outcome: Adequate for Discharge

## 2023-03-29 NOTE — Evaluation (Signed)
Occupational Therapy Evaluation Patient Details Name: Amber [name] MRN: [id number] DOB: [birth year] Today's Date: 03/29/2023   History of Present Illness 82 yo female s/p oophorectomy 03/27/23. Hx of bil shoulder surgery, back surgery, TKA, chronic pain, macular degeneration, DVT, LBBB, OA   Clinical Impression   Pt was seen for OT evaluation this date. Prior to hospital admission, pt required assistance with ADLs (bathing, dressing) and used RW for mobility. Pt lives in an ALF, and reports that she typically is able to complete mobility to/from bathroom MOD I. Pt unsure if ALF can provide increased levels of support upon hospital discharge. She says that meals can be delivered from the dining room, but sometimes it takes staff 20+ mins to respond to calls for ADL assist. 5/10 pain reported start and end of session, pt wearing abdominal binder.   Pt presents to acute OT demonstrating impaired ADL performance and functional mobility 2/2 generalized weakness, decreased tolerance to activity, and pain (See OT problem list for additional functional deficits). Pt currently functioning below baseline for ADL performance; bed mobility performed supine to sit with CGA and cues for technique, requires MIN A standing level for UB dressing, MOD A for LB dressing and CGA for mobility using RW. Pt able to maintain dynamic standing balance without UE support on RW while practicing UB dressing, no LOB but wide stance and forward flexed posture. Pt uses compensatory strategies due to R UE ROM deficits at baseline. Pt would benefit from skilled OT services to address noted impairments and functional limitations (see below for any additional details) in order to maximize safety and independence while minimizing falls risk and caregiver burden. Anticipate the need for follow up OT services upon acute hospital DC.  Patient will benefit from continued inpatient follow up therapy, <3 hours/day. Care team updated via secure  chat.       If plan is discharge home, recommend the following: A little help with walking and/or transfers;A little help with bathing/dressing/bathroom;Direct supervision/assist for medications management;Direct supervision/assist for financial management;Assist for transportation    Functional Status Assessment  Patient has had a recent decline in their functional status and demonstrates the ability to make significant improvements in function in a reasonable and predictable amount of time.  Equipment Recommendations  None recommended by OT (has necessary DME)    Recommendations for Other Services Other (comment)     Precautions / Restrictions Precautions Precautions: Fall Restrictions Weight Bearing Restrictions: No      Mobility Bed Mobility Overal bed mobility: Needs Assistance Bed Mobility: Supine to Sit, Sit to Supine     Supine to sit: Contact guard, HOB elevated, Used rails Sit to supine: Contact guard assist, HOB elevated, Used rails   General bed mobility comments: pt did not feel she could attempt rolling, sleeps in recliner at baseline    Transfers Overall transfer level: Needs assistance Equipment used: Rolling walker (2 wheels) Transfers: Sit to/from Stand, Bed to chair/wheelchair/BSC Sit to Stand: Min assist     Step pivot transfers: Min assist     General transfer comment: increased time, pt protective of surgical site      Balance Overall balance assessment: Needs assistance Sitting-balance support: Feet supported       Standing balance support: During functional activity, No upper extremity supported Standing balance-Leahy Scale: Fair Standing balance comment: stands for UB dressing wide stance  ADL either performed or assessed with clinical judgement   ADL Overall ADL's : Needs assistance/impaired                 Upper Body Dressing : Minimal assistance;Standing;Cueing for sequencing Upper  Body Dressing Details (indicate cue type and reason): standing to practice donning shirt Lower Body Dressing: Moderate assistance;Cueing for sequencing;Sitting/lateral leans;Cueing for compensatory techniques Lower Body Dressing Details (indicate cue type and reason): Socks and simulated LB dressing, edu on AE and technique for pain mgmt Toilet Transfer: Contact guard assist;Ambulation;Regular Toilet;Rolling walker (2 wheels) Toilet Transfer Details (indicate cue type and reason): CGA, pt reports having raised toilet at home Toileting- Clothing Manipulation and Hygiene: Contact guard assist;Sitting/lateral lean       Functional mobility during ADLs: Rolling walker (2 wheels);Contact guard assist General ADL Comments: Pt currently functioning below baseline for ADL performance; bed mobility performed supine to sit with CGA and cues for technique, requires MIN A standing level for UB dressing, MOD A for LB dressing and CGA for mobility using RW. Pt able to maintain dynamic standing balance without UE support on RW while practicing UB dressing, no LOB but wide stance and forward flexed posture. Pt uses compensatory strategies due to R UE ROM deficits at baseline. Limited on eval with generalized weakness and decreased tolerance to activity, with pain at 5/10 at surgical site.      Pertinent Vitals/Pain Pain Assessment Pain Assessment: 0-10 Pain Score: 5  Pain Location: abdomen Pain Descriptors / Indicators: Operative site guarding, Grimacing     Extremity/Trunk Assessment Upper Extremity Assessment Upper Extremity Assessment: RUE deficits/detail;Overall [facility] for tasks assessed;Generalized weakness (chronic RTC issues in R shoulder, limited ROM to 0-45 deg, pt aware and implements compensatory strategies for UB ADLs) RUE Coordination: decreased gross motor   Lower Extremity Assessment Lower Extremity Assessment: Generalized weakness       Communication Communication Communication: No  apparent difficulties   Cognition Arousal: Alert Behavior During Therapy: WFL for tasks assessed/performed Overall Cognitive Status: Within Functional Limits for tasks assessed                                                  Home Living Family/patient expects to be discharged to:: Assisted living   Available Help at Discharge: Personal care attendant   Home Access: Level entry     Home Layout: One level               Home Equipment: Agricultural consultant (2 wheels);Shower seat;Grab bars - toilet;Grab bars - tub/shower   Additional Comments: pt reports that while it is an ALF, it is hard to get assistance.      Prior Functioning/Environment Prior Level of Function : Needs assist             Mobility Comments: uses walker. walks to dining room ADLs Comments: assist with meds and ADLs as needed. Required assist with dressing, bathing but could walk to bathroom and perform hygine mod I        OT Problem List: Decreased strength;Decreased range of motion;Decreased activity tolerance;Impaired balance (sitting and/or standing);Decreased knowledge of use of DME or AE;Pain;Impaired UE functional use      OT Treatment/Interventions: Self-care/ADL training;Therapeutic exercise;Energy conservation;DME and/or AE instruction;Balance training;Patient/family education    OT Goals(Current goals can be found in the care plan section) Acute Rehab OT Goals  OT Goal Formulation: With patient Time For Goal Achievement: 04/12/23 Potential to Achieve Goals: Good  OT Frequency: Min 1X/week       AM-PAC OT "6 Clicks" Daily Activity     Outcome Measure Help from another person eating meals?: None Help from another person taking care of personal grooming?: None Help from another person toileting, which includes using toliet, bedpan, or urinal?: A Little Help from another person bathing (including washing, rinsing, drying)?: A Little Help from another person to put on and  taking off regular upper body clothing?: A Little Help from another person to put on and taking off regular lower body clothing?: A Little 6 Click Score: 20   End of Session Equipment Utilized During Treatment: Other (comment);Rolling walker (2 wheels) (ab binder) Nurse Communication: Other (comment)  Activity Tolerance: Patient tolerated treatment well Patient left: in bed;with call bell/phone within reach;with bed alarm set;with SCD's reapplied  OT Visit Diagnosis: Unsteadiness on feet (R26.81);Muscle weakness (generalized) (M62.81);Pain Pain - Right/Left: Left Pain - part of body:  (abdp,em)                Time: 7829-5621 OT Time Calculation (min): 28 min Charges:  OT General Charges $OT Visit: 1 Visit OT Evaluation $OT Eval Moderate Complexity: 1 Mod OT Treatments $Self Care/Home Management : 8-22 mins  Dickie Labarre L. Rodgerick Gilliand, OTR/L  03/29/23, 12:00 PM

## 2023-03-29 NOTE — TOC Transition Note (Signed)
Transition of Care [facility]) - CM/SW Discharge Note  Patient Details  Name: Amber [name] MRN: [id number] Date of Birth: [birth year]  Transition of Care [facility]) CM/SW Contact:  Ewing Schlein, LCSW Phone Number: 03/29/2023, 12:00 PM  Clinical Narrative: FL2 completed. Discharge summary and FL2 faxed to [facility] for review [phone]). CSW spoke with Marquita at [facility] and confirmed the paperwork has been reviewed. CSW notified Cindie with [facility] that patient will discharge today and HHPT orders have been placed. CSW updated LPN. TOC signing off.   Final next level of care: Assisted Living Barriers to Discharge: Barriers Resolved  Patient Goals and CMS Choice CMS Medicare.gov Compare Post Acute Care list provided to:: Patient Choice offered to / list presented to : Patient  Discharge Plan and Services Additional resources added to the After Visit Summary for   In-house Referral: Clinical Social Work Post Acute Care Choice: Home Health          DME Arranged: N/A DME Agency: NA HH Arranged: PT HH Agency: [facility] Date [facility] Agency Contacted: 03/28/23 Time HH Agency Contacted: [id number] Representative spoke with at [facility] Agency: Cindie  Social Determinants of Health (SDOH) Interventions SDOH Screenings   Food Insecurity: No Food Insecurity (03/27/2023)  Housing: Low Risk  (03/27/2023)  Transportation Needs: No Transportation Needs (03/27/2023)  Utilities: Not At Risk (03/27/2023)  Depression (PHQ2-9): Low Risk  (09/06/2022)  Financial Resource Strain: Low Risk  (11/08/2021)  Physical Activity: Inactive (11/08/2021)  Social Connections: Moderately Integrated (11/08/2021)  Stress: No Stress Concern Present (11/08/2021)  Tobacco Use: Medium Risk (03/27/2023)   Readmission Risk Interventions     No data to display

## 2023-03-29 NOTE — Progress Notes (Signed)
I spoke with son Hermione Hausknecht at 12:58 pm and Daughter in law Prem Moyeda at 1:01 pm and advised them both that pt will be returning back to [facility]. Will have HHPT thru [facility]. I contacted [facility] at 1:05 pm and spoke with [facility], advised her that pt will be returning after having lunch.

## 2023-03-29 NOTE — NC FL2 (Addendum)
[place] MEDICAID FL2 LEVEL OF CARE FORM     IDENTIFICATION  Patient Name: Amber [name] Birthdate: Oct 12, 1940 Sex: female Admission Date (Current Location): 03/27/2023  [facility] and [place] Number:  Producer, television/film/video and Address:  [facility],  [address] [place], [place] [address]      Provider Number: [id number]  Attending Physician Name and Address:  Clide Cliff, MD  Relative Name and Phone Number:  Aziana Dall (son) Ph: [phone]    Current Level of Care: Hospital Recommended Level of Care: Assisted Living Facility Prior Approval Number:    Date Approved/Denied:   PASRR Number:    Discharge Plan: Other (Comment) ([facility])    Current Diagnoses: Patient Active Problem List   Diagnosis Date Noted   Adnexal mass 03/27/2023   Leukocytosis 04/16/2022   Septic shock (HCC) 04/16/2022   Hypoalbuminemia 04/16/2022   E coli bacteremia 04/16/2022   Coronary artery disease 04/16/2022   Abnormal LFTs 04/16/2022   PAD (peripheral artery disease) (HCC) 04/16/2022   Chronic back pain 04/16/2022   Lumbar radiculopathy 04/16/2022   Hip pain 04/16/2022   Constipation 04/16/2022   Obesity (BMI 30-39.9) 04/16/2022   Chest pain of uncertain etiology 04/10/2022   NSTEMI (non-ST elevated myocardial infarction) (HCC) 04/09/2022   E. coli UTI (urinary tract infection) 04/08/2022   Severe sepsis (HCC) 04/08/2022   Transient hypotension 04/08/2022   Acute metabolic encephalopathy 04/08/2022   Cellulitis, face 04/08/2022   Elevated troponin 04/08/2022   AKI (acute kidney injury) (HCC) 04/08/2022   Normocytic anemia 04/08/2022   Thrombocytopenia (HCC) 04/08/2022   Sepsis (HCC) 04/08/2022   Lumbosacral radiculopathy at S1 12/06/2020   Hyponatremia 12/06/2020   Mixed hyperlipidemia 12/06/2020   Osteopenia after menopause 12/06/2020   Class 1 obesity due to excess calories in adult 03/29/2020   Prediabetes 03/18/2020   Laryngopharyngeal reflux (LPR)  03/11/2019   Gastroesophageal reflux disease 01/19/2016   Hoarseness, chronic 12/28/2015   Complete left bundle branch block 12/08/2015   Essential hypertension 12/08/2015   Acquired hypothyroidism 12/08/2015   Peripheral vascular disease, unspecified (HCC) 04/18/2013   Chronic venous insufficiency 04/18/2013    Orientation RESPIRATION BLADDER Height & Weight     Self, Time, Situation, Place  Normal Continent Weight: 167 lb (75.8 kg) Height:  5\' 1"  (154.9 cm)  BEHAVIORAL SYMPTOMS/MOOD NEUROLOGICAL BOWEL NUTRITION STATUS      Continent Diet (Heart healthy diet)  AMBULATORY STATUS COMMUNICATION OF NEEDS Skin   Limited Assist Verbally Surgical wounds                       Personal Care Assistance Level of Assistance  Bathing, Feeding, Dressing Bathing Assistance: Limited assistance Feeding assistance: Independent Dressing Assistance: Limited assistance     Functional Limitations Info  Sight, Hearing, Speech Sight Info: Impaired Hearing Info: Adequate Speech Info: Adequate    SPECIAL CARE FACTORS FREQUENCY                       Contractures Contractures Info: Not present    Additional Factors Info  Code Status, Allergies Code Status Info: Full Allergies Info: Sulfa Antibiotics, Codeine, Darvocet (Propoxyphene N-acetaminophen), Nickel, Vicodin (Hydrocodone-acetaminophen)           Current Medications (03/29/2023):  This is the current hospital active medication list Current Facility-Administered Medications  Medication Dose Route Frequency Provider Last Rate Last Admin   acetaminophen (TYLENOL) tablet 650 mg  650 mg Oral Q6H Clide Cliff, MD  650 mg at 03/29/23 0527   amLODipine (NORVASC) tablet 5 mg  5 mg Oral Daily Antionette Char, MD   5 mg at 03/28/23 0859   atorvastatin (LIPITOR) tablet 20 mg  20 mg Oral QHS Antionette Char, MD   20 mg at 03/28/23 2058   cyclobenzaprine (FLEXERIL) tablet 10 mg  10 mg Oral TID PRN Antionette Char, MD    10 mg at 03/28/23 2107   enoxaparin (LOVENOX) injection 40 mg  40 mg Subcutaneous Q24H Clide Cliff, MD   40 mg at 03/28/23 0901   gabapentin (NEURONTIN) capsule 100 mg  100 mg Oral TID Antionette Char, MD   100 mg at 03/28/23 2058   ibuprofen (ADVIL) tablet 800 mg  800 mg Oral Q8H Cross, Melissa D, NP   800 mg at 03/29/23 0532   levothyroxine (SYNTHROID) tablet 50 mcg  50 mcg Oral Q0600 Antionette Char, MD   50 mcg at 03/29/23 0532   metoprolol tartrate (LOPRESSOR) tablet 25 mg  25 mg Oral BID Antionette Char, MD   25 mg at 03/28/23 2059   nitroGLYCERIN (NITROSTAT) SL tablet 0.4 mg  0.4 mg Sublingual Q5 min PRN Antionette Char, MD       ondansetron [facility]) tablet 4 mg  4 mg Oral Q6H PRN Antionette Char, MD       Or   ondansetron [facility]) injection 4 mg  4 mg Intravenous Q6H PRN Antionette Char, MD       oxybutynin (DITROPAN-XL) 24 hr tablet 5 mg  5 mg Oral QHS Antionette Char, MD   5 mg at 03/28/23 2107   oxyCODONE (Oxy IR/ROXICODONE) immediate release tablet 5 mg  5 mg Oral Q4H PRN Clide Cliff, MD       pantoprazole (PROTONIX) EC tablet 20 mg  20 mg Oral Daily Antionette Char, MD   20 mg at 03/28/23 0900   polyvinyl alcohol (LIQUIFILM TEARS) 1.4 % ophthalmic solution 1 drop  1 drop Both Eyes QHS Antionette Char, MD   1 drop at 03/28/23 2108   senna-docusate (Senokot-S) tablet 2 tablet  2 tablet Oral QHS Antionette Char, MD   2 tablet at 03/28/23 2059   sodium chloride flush (NS) 0.9 % injection 10 mL  10 mL Intravenous Q12H Antionette Char, MD   10 mL at 03/28/23 2108   traMADol (ULTRAM) tablet 50-100 mg  50-100 mg Oral Q12H PRN Warner Mccreedy D, NP   50 mg at 03/29/23 1610     Discharge Medications: Please see discharge summary for a list of discharge medications.  acetaminophen 500 MG tablet Commonly known as: TYLENOL Take 1,000 mg by mouth 3 (three) times daily.    amLODipine 5 MG tablet Commonly known as: NORVASC Take 5 mg by  mouth daily.    aspirin EC 81 MG tablet Take 1 tablet (81 mg total) by mouth daily. Swallow whole.    atorvastatin 20 MG tablet Commonly known as: LIPITOR Take 20 mg by mouth at bedtime.    AZO CRANBERRY PO Take 1 tablet by mouth daily.    calcium carbonate 750 MG chewable tablet Commonly known as: TUMS EX Chew 1 tablet by mouth every 4 (four) hours as needed for heartburn.    clobetasol ointment 0.05 % Commonly known as: TEMOVATE Apply 1 Application topically every evening.    cyclobenzaprine 10 MG tablet Commonly known as: FLEXERIL Take 1 tablet (10 mg total) by mouth 3 (three) times daily as needed for muscle spasms.    diclofenac Sodium 1 %  Gel Commonly known as: VOLTAREN Apply 2 g topically 3 (three) times daily as needed (joint pain).    fluconazole 150 MG tablet Commonly known as: DIFLUCAN Take 1 pill at the first sign of a yeast infection, may repeat in 48 hours if needed    gabapentin 100 MG capsule Commonly known as: NEURONTIN TAKE 1 CAPSULE(100 MG) BY MOUTH THREE TIMES DAILY    levothyroxine 50 MCG tablet Commonly known as: SYNTHROID Take 1 tablet (50 mcg total) by mouth daily before breakfast.    losartan-hydrochlorothiazide 100-12.5 MG tablet Commonly known as: HYZAAR Take 1 tablet by mouth daily. What changed: Another medication with the same name was removed. Continue taking this medication, and follow the directions you see here.    metoprolol tartrate 25 MG tablet Commonly known as: LOPRESSOR Take 1 tablet (25 mg total) by mouth 2 (two) times daily.    nitrofurantoin 100 MG capsule Commonly known as: MACRODANTIN Take 100 mg by mouth daily.    nitroGLYCERIN 0.4 MG SL tablet Commonly known as: NITROSTAT Place 1 tablet (0.4 mg total) under the tongue every 5 (five) minutes as needed for chest pain.    nystatin powder Commonly known as: MYCOSTATIN/NYSTOP Apply 1 Application topically daily as needed (for rash/redness).    ondansetron 4 MG  tablet Commonly known as: ZOFRAN Take 1 tablet (4 mg total) by mouth every 6 (six) hours as needed for nausea.    oxybutynin 5 MG 24 hr tablet Commonly known as: DITROPAN-XL Take 5 mg by mouth at bedtime.    oxyCODONE 5 MG immediate release tablet Commonly known as: Oxy IR/ROXICODONE Take 1 tablet (5 mg total) by mouth every 6 (six) hours as needed for severe pain. What changed: when to take this    pantoprazole 20 MG tablet Commonly known as: PROTONIX Take 20 mg by mouth daily.    polyethylene glycol 17 g packet Commonly known as: MIRALAX / GLYCOLAX Take 17 g by mouth daily. What changed: when to take this    polyvinyl alcohol 1.4 % ophthalmic solution Commonly known as: LIQUIFILM TEARS Place 1 drop into both eyes daily as needed for dry eyes.    PRESERVISION AREDS PO Take 1 tablet by mouth 2 (two) times daily.    Refresh 1.4-0.6 % Soln Generic drug: Polyvinyl Alcohol-Povidone PF Place 1 drop into both eyes in the morning and at bedtime.    senna-docusate 8.6-50 MG tablet Commonly known as: Senokot-S Take 1 tablet by mouth at bedtime.    UTI-Stat Liqd Take 30 mLs by mouth daily in the afternoon.    Vitamin D3 25 MCG (1000 UT) Caps Take 1,000 Units by mouth daily.    Vitamin-B Complex Tabs Take 1 tablet by mouth daily.    Relevant Imaging Results:  Relevant Lab Results:   Additional Information SSN: [id number]  Ewing Schlein, LCSW

## 2023-03-29 NOTE — Plan of Care (Signed)
  Problem: Education: Goal: Understanding of CV disease, CV risk reduction, and recovery process will improve Outcome: Progressing   Problem: Activity: Goal: Ability to return to baseline activity level will improve Outcome: Progressing   Problem: Education: Goal: Knowledge of General Education information will improve Description: Including pain rating scale, medication(s)/side effects and non-pharmacologic comfort measures Outcome: Progressing   Problem: Health Behavior/Discharge Planning: Goal: Ability to manage health-related needs will improve Outcome: Progressing   Problem: Clinical Measurements: Goal: Will remain free from infection Outcome: Progressing Goal: Diagnostic test results will improve Outcome: Progressing

## 2023-03-29 NOTE — Discharge Summary (Signed)
Physician Discharge Summary  Patient ID: Amber [name] MRN: [id number] DOB/AGE: [birth year] 84 y.o.  Admit date: 03/27/2023 Discharge date: 03/29/2023  Admission Diagnoses: Adnexal mass  Discharge Diagnoses:  Principal Problem:   Adnexal mass   Discharged Condition:  The patient is in good condition and stable for discharge.    Hospital Course: On 03/27/2023, the patient underwent the following: Procedure(s): XI ROBOTIC ASSISTED RIGHT OOPHORECTOMY. The postoperative course included moderate to severe at times abdominal pain affecting mobility.  PT/OT consults performed. HHPT recommended at discharge. She was discharged to home on postoperative day 2 tolerating a regular diet, ambulating with walker in halls without difficulty and assist from staff, voiding, pain controlled with oral medications and improved from POD 1, passing flatus.   Consults: PT/OT  Significant Diagnostic Studies: Labs  Treatments: surgery: see above  Discharge Exam: Blood pressure (!) 119/53, pulse 62, temperature 98.1 F (36.7 C), temperature source Oral, resp. rate 18, height 5\' 1"  (1.549 m), weight 167 lb (75.8 kg), SpO2 97%. General appearance: alert, cooperative, and no distress Resp: clear to auscultation bilaterally Cardio: regular rate and rhythm, S1, S2 normal, no murmur, click, rub or gallop GI: soft, non-tender; bowel sounds normal; no masses,  no organomegaly Extremities: extremities normal, atraumatic, no cyanosis or edema Incision/Wound: Lap sites to the abdomen with dermabond intact without active drainage or erythema. Binder in place. Heating pad over incisions.  Disposition: Discharge disposition: 70-Another Health Care Institution Not Defined     Discharged back to Assisted Living  Discharge Instructions     Call MD for:  difficulty breathing, headache or visual disturbances   Complete by: As directed    Call MD for:  extreme fatigue   Complete by: As directed    Call MD for:   hives   Complete by: As directed    Call MD for:  persistant dizziness or light-headedness   Complete by: As directed    Call MD for:  persistant nausea and vomiting   Complete by: As directed    Call MD for:  redness, tenderness, or signs of infection (pain, swelling, redness, odor or green/yellow discharge around incision site)   Complete by: As directed    Call MD for:  severe uncontrolled pain   Complete by: As directed    Call MD for:  temperature >100.4   Complete by: As directed    Diet - low sodium heart healthy   Complete by: As directed    Driving Restrictions   Complete by: As directed    No driving for 1-2 week(s) based on the following.  Do not take narcotics and drive. You need to make sure your reaction time has returned.   Increase activity slowly   Complete by: As directed    Lifting restrictions   Complete by: As directed    No lifting greater than 10 lbs, pushing, pulling, straining for 6 weeks.   Sexual Activity Restrictions   Complete by: As directed    No sexual activity, nothing in the vagina, for 6 weeks.      Allergies as of 03/29/2023       Reactions   Sulfa Antibiotics Rash   Like a sunburn   Codeine Nausea And Vomiting   Darvocet [propoxyphene N-acetaminophen] Nausea And Vomiting   Nickel Rash   Vicodin [hydrocodone-acetaminophen] Other (See Comments)   GI upset        Medication List     TAKE these medications    acetaminophen 500 MG tablet  Commonly known as: TYLENOL Take 1,000 mg by mouth 3 (three) times daily.   amLODipine 5 MG tablet Commonly known as: NORVASC Take 5 mg by mouth daily.   aspirin EC 81 MG tablet Take 1 tablet (81 mg total) by mouth daily. Swallow whole.   atorvastatin 20 MG tablet Commonly known as: LIPITOR Take 20 mg by mouth at bedtime.   AZO CRANBERRY PO Take 1 tablet by mouth daily.   calcium carbonate 750 MG chewable tablet Commonly known as: TUMS EX Chew 1 tablet by mouth every 4 (four) hours as  needed for heartburn.   clobetasol ointment 0.05 % Commonly known as: TEMOVATE Apply 1 Application topically every evening.   cyclobenzaprine 10 MG tablet Commonly known as: FLEXERIL Take 1 tablet (10 mg total) by mouth 3 (three) times daily as needed for muscle spasms.   diclofenac Sodium 1 % Gel Commonly known as: VOLTAREN Apply 2 g topically 3 (three) times daily as needed (joint pain).   fluconazole 150 MG tablet Commonly known as: DIFLUCAN Take 1 pill at the first sign of a yeast infection, may repeat in 48 hours if needed   gabapentin 100 MG capsule Commonly known as: NEURONTIN TAKE 1 CAPSULE(100 MG) BY MOUTH THREE TIMES DAILY   levothyroxine 50 MCG tablet Commonly known as: SYNTHROID Take 1 tablet (50 mcg total) by mouth daily before breakfast.   losartan-hydrochlorothiazide 100-12.5 MG tablet Commonly known as: HYZAAR Take 1 tablet by mouth daily. What changed: Another medication with the same name was removed. Continue taking this medication, and follow the directions you see here.   metoprolol tartrate 25 MG tablet Commonly known as: LOPRESSOR Take 1 tablet (25 mg total) by mouth 2 (two) times daily.   nitrofurantoin 100 MG capsule Commonly known as: MACRODANTIN Take 100 mg by mouth daily.   nitroGLYCERIN 0.4 MG SL tablet Commonly known as: NITROSTAT Place 1 tablet (0.4 mg total) under the tongue every 5 (five) minutes as needed for chest pain.   nystatin powder Commonly known as: MYCOSTATIN/NYSTOP Apply 1 Application topically daily as needed (for rash/redness).   ondansetron 4 MG tablet Commonly known as: ZOFRAN Take 1 tablet (4 mg total) by mouth every 6 (six) hours as needed for nausea.   oxybutynin 5 MG 24 hr tablet Commonly known as: DITROPAN-XL Take 5 mg by mouth at bedtime.   oxyCODONE 5 MG immediate release tablet Commonly known as: Oxy IR/ROXICODONE Take 1 tablet (5 mg total) by mouth every 6 (six) hours as needed for severe pain. What  changed: when to take this   pantoprazole 20 MG tablet Commonly known as: PROTONIX Take 20 mg by mouth daily.   polyethylene glycol 17 g packet Commonly known as: MIRALAX / GLYCOLAX Take 17 g by mouth daily. What changed: when to take this   polyvinyl alcohol 1.4 % ophthalmic solution Commonly known as: LIQUIFILM TEARS Place 1 drop into both eyes daily as needed for dry eyes.   PRESERVISION AREDS PO Take 1 tablet by mouth 2 (two) times daily.   Refresh 1.4-0.6 % Soln Generic drug: Polyvinyl Alcohol-Povidone PF Place 1 drop into both eyes in the morning and at bedtime.   senna-docusate 8.6-50 MG tablet Commonly known as: Senokot-S Take 1 tablet by mouth at bedtime.   UTI-Stat Liqd Take 30 mLs by mouth daily in the afternoon.   Vitamin D3 25 MCG (1000 UT) Caps Take 1,000 Units by mouth daily.   Vitamin-B Complex Tabs Take 1 tablet by mouth daily.  Follow-up Information     Clide Cliff, MD Follow up on 04/09/2023.   Specialty: Gynecologic Oncology Why: at 2:15pm at the [facility] information: [address] [place] [address] [phone]                 Greater than thirty minutes were spend for face to face discharge instructions and discharge orders/summary in EPIC.   Signed: Doylene Bode 03/29/2023, 7:42 AM

## 2023-03-30 ENCOUNTER — Telehealth: Payer: Self-pay | Admitting: *Deleted

## 2023-03-30 NOTE — Telephone Encounter (Signed)
Attempted to reach patient for post op call. Left voicemail requesting call back.

## 2023-03-30 NOTE — Telephone Encounter (Signed)
Spoke with Ms. Hilgeman this morning. She states she is eating, drinking and urinating well. She has not had a BM yet but is passing gas. She is taking senokot as prescribed and encouraged her to drink plenty of water. She denies fever or chills. Incisions are dry and intact. She rates her pain 4/10. Her pain is controlled with oxycodone.    Instructed to call office with any fever, chills, purulent drainage, uncontrolled pain or any other questions or concerns. Patient verbalizes understanding.   Pt aware of post op appointments as well as the office number [phone] and after hours number [phone] to call if she has any questions or concerns

## 2023-04-09 ENCOUNTER — Ambulatory Visit: Payer: Medicare Other

## 2023-04-09 ENCOUNTER — Encounter: Payer: Self-pay | Admitting: Psychiatry

## 2023-04-09 ENCOUNTER — Inpatient Hospital Stay: Payer: Medicare Other | Attending: Gynecologic Oncology | Admitting: Psychiatry

## 2023-04-09 VITALS — BP 144/58 | HR 69 | Temp 97.8°F | Resp 19 | Wt 169.6 lb

## 2023-04-09 DIAGNOSIS — Z90721 Acquired absence of ovaries, unilateral: Secondary | ICD-10-CM

## 2023-04-09 DIAGNOSIS — T7840XA Allergy, unspecified, initial encounter: Secondary | ICD-10-CM

## 2023-04-09 DIAGNOSIS — N9489 Other specified conditions associated with female genital organs and menstrual cycle: Secondary | ICD-10-CM

## 2023-04-09 NOTE — Progress Notes (Signed)
Gynecologic Oncology Return Clinic Visit  Date of Service: 04/09/2023 Referring Provider: Belva Agee, MD   Assessment & Plan: Amber [name] is a 82 y.o. woman with a complex adnexal mass who is s/p RA-right oophorectomy, adhesiolysis on 03/27/23, final pathology benign.  Postop: - Pt recovering well from surgery and healing appropriately postoperatively - Intraoperative findings and pathology results reviewed. Benign ovary and cytology. - Reviewed that left oophorectomy was deferred given normal right ovary on frozen, benign atrophic left ovary and densely adherent to colon, minimize surgical risk.  - Ongoing postoperative expectations and precautions reviewed. Okay to resume the exercise bike. - Dermabond reaction. Dermabond removed. Okay to use hydrocortisone cream prn for itching.  - Okay to resume routine care.   RTC prn.  Clide Cliff, MD Gynecologic Oncology    ----------------------- Reason for Visit: Postop  Interval History: Pt reports that she is recovering well from surgery. She is using her baseline tylenol and oxycodone for pain but not needing additional medications at this time. She is eating and drinking well. She is voiding without issue and having regular bowel movements.   Does note redness and itching around her incisions.  Past Medical/Surgical History: Past Medical History:  Diagnosis Date   Arthritis    Coronary artery disease    DVT (deep venous thrombosis) (HCC)    Family history of blood clots    Hx of blood clots    Hypertension    Hypothyroidism    Left bundle branch block    Macular degeneration    Peripheral vascular disease (HCC)    Pneumonia    Post-phlebitic syndrome 04/18/2013   Rotator cuff rupture, complete 07/05/2011   Varicose veins     Past Surgical History:  Procedure Laterality Date   APPENDECTOMY  1979   BACK SURGERY  2009   ENDOVENOUS ABLATION SAPHENOUS VEIN W/ LASER Right 09/15/2013   right greater saphenous vein,  by Gretta Began MD   ENDOVENOUS ABLATION SAPHENOUS VEIN W/ LASER Left 10/16/2013   left greater saphenous vein, by Gretta Began MD   LEFT HEART CATH AND CORONARY ANGIOGRAPHY N/A 04/10/2022   Procedure: LEFT HEART CATH AND CORONARY ANGIOGRAPHY;  Surgeon: Lennette Bihari, MD;  Location: [facility];  Service: Cardiovascular;  Laterality: N/A;   left open rotator cuff surgery   2010   lumbar infusion  01/16/2008   PARTIAL HYSTERECTOMY  1979   TAH pfannenstiel, fibroids   ROBOTIC ASSISTED SALPINGO OOPHERECTOMY Bilateral 03/27/2023   Procedure: XI ROBOTIC ASSISTED RIGHT OOPHORECTOMY;  Surgeon: Clide Cliff, MD;  Location: [facility];  Service: Gynecology;  Laterality: Bilateral;   SHOULDER OPEN ROTATOR CUFF REPAIR  07/05/2011   Procedure: ROTATOR CUFF REPAIR SHOULDER OPEN;  Surgeon: Jacki Cones, MD;  Location: [facility];  Service: Orthopedics;  Laterality: Right;   TOTAL KNEE ARTHROPLASTY Right 09/09/2014   Procedure: TOTAL RIGHT  KNEE ARTHROPLASTY;  Surgeon: Ranee Gosselin, MD;  Location: [facility];  Service: Orthopedics;  Laterality: Right;    Family History  Problem Relation Age of Onset   Heart disease Mother    Esophageal cancer Mother    Varicose Veins Mother    Deep vein thrombosis Mother    Diabetes Mother    Hypertension Mother    Depression Mother    Obesity Mother    Heart disease Father    AAA (abdominal aortic aneurysm) Father    Varicose Veins Sister    Lung cancer Sister    Diabetes Sister  Heart disease Sister 35   Hypertension Sister    Vision loss Sister    Diabetes Sister    Heart disease Sister    Hypertension Sister    Vision loss Sister    Diabetes Sister    Vision loss Sister    Diabetes Brother    Vision loss Brother    Peripheral vascular disease Brother    AAA (abdominal aortic aneurysm) Brother    Breast cancer Neg Hx    Ovarian cancer Neg Hx    Endometrial cancer Neg Hx    Colon cancer Neg Hx     Social History   Socioeconomic History    Marital status: Widowed    Spouse name: Not on file   Number of children: Not on file   Years of education: Not on file   Highest education level: Not on file  Occupational History   Occupation: retired  Tobacco Use   Smoking status: Former    Current packs/day: 0.00    Average packs/day: 0.3 packs/day for 13.0 years (3.3 ttl pk-yrs)    Types: Cigarettes    Start date: 06/23/1951    Quit date: 06/22/1964    Years since quitting: 58.8   Smokeless tobacco: Never  Vaping Use   Vaping status: Never Used  Substance and Sexual Activity   Alcohol use: No   Drug use: No   Sexual activity: Never    Birth control/protection: Post-menopausal  Other Topics Concern   Not on file  Social History Narrative   Lives home alone.  Is a widow.  Education: HS grad.  2 children. Caffeine 2.5 cups daily.   Social Determinants of Health   Financial Resource Strain: Low Risk  (11/08/2021)   Overall Financial Resource Strain (CARDIA)    Difficulty of Paying Living Expenses: Not hard at all  Food Insecurity: No Food Insecurity (03/27/2023)   Hunger Vital Sign    Worried About Running Out of Food in the Last Year: Never true    Ran Out of Food in the Last Year: Never true  Transportation Needs: No Transportation Needs (03/27/2023)   PRAPARE - Administrator, Civil Service (Medical): No    Lack of Transportation (Non-Medical): No  Physical Activity: Inactive (11/08/2021)   Exercise Vital Sign    Days of Exercise per Week: 0 days    Minutes of Exercise per Session: 0 min  Stress: No Stress Concern Present (11/08/2021)   [employer] of Occupational Health - Occupational Stress Questionnaire    Feeling of Stress : Not at all  Social Connections: Moderately Integrated (11/08/2021)   Social Connection and Isolation Panel [NHANES]    Frequency of Communication with Friends and Family: More than three times a week    Frequency of Social Gatherings with Friends and Family: More than three  times a week    Attends Religious Services: More than 4 times per year    Active Member of [employer] or Organizations: Yes    Attends Banker Meetings: 1 to 4 times per year    Marital Status: Widowed    Current Medications:  Current Outpatient Medications:    acetaminophen (TYLENOL) 500 MG tablet, Take 1,000 mg by mouth 3 (three) times daily., Disp: , Rfl:    amLODipine (NORVASC) 5 MG tablet, Take 5 mg by mouth daily., Disp: , Rfl:    aspirin EC 81 MG tablet, Take 1 tablet (81 mg total) by mouth daily. Swallow whole., Disp: 30 tablet, Rfl: 12  atorvastatin (LIPITOR) 20 MG tablet, Take 20 mg by mouth at bedtime., Disp: , Rfl:    B Complex Vitamins (VITAMIN-B COMPLEX) TABS, Take 1 tablet by mouth daily. , Disp: , Rfl:    calcium carbonate (TUMS EX) 750 MG chewable tablet, Chew 1 tablet by mouth every 4 (four) hours as needed for heartburn., Disp: , Rfl:    Cholecalciferol (VITAMIN D3) 1000 units CAPS, Take 1,000 Units by mouth daily. , Disp: , Rfl:    clobetasol ointment (TEMOVATE) 0.05 %, Apply 1 Application topically every evening., Disp: , Rfl:    Cranberry-Vitamin C-Inulin (UTI-STAT) LIQD, Take 30 mLs by mouth daily in the afternoon., Disp: , Rfl:    Cranberry-Vitamin C-Probiotic (AZO CRANBERRY PO), Take 1 tablet by mouth daily., Disp: , Rfl:    cyclobenzaprine (FLEXERIL) 10 MG tablet, Take 1 tablet (10 mg total) by mouth 3 (three) times daily as needed for muscle spasms., Disp: 30 tablet, Rfl: 0   diclofenac Sodium (VOLTAREN) 1 % GEL, Apply 2 g topically 3 (three) times daily as needed (joint pain)., Disp: , Rfl:    fluconazole (DIFLUCAN) 150 MG tablet, Take 1 pill at the first sign of a yeast infection, may repeat in 48 hours if needed, Disp: , Rfl:    gabapentin (NEURONTIN) 100 MG capsule, TAKE 1 CAPSULE(100 MG) BY MOUTH THREE TIMES DAILY, Disp: 90 capsule, Rfl: 1   levothyroxine (SYNTHROID) 50 MCG tablet, Take 1 tablet (50 mcg total) by mouth daily before breakfast., Disp: 90  tablet, Rfl: 3   losartan-hydrochlorothiazide (HYZAAR) 100-12.5 MG tablet, Take 1 tablet by mouth daily., Disp: , Rfl:    metoprolol tartrate (LOPRESSOR) 25 MG tablet, Take 1 tablet (25 mg total) by mouth 2 (two) times daily., Disp: 60 tablet, Rfl: 0   Multiple Vitamins-Minerals (PRESERVISION AREDS PO), Take 1 tablet by mouth 2 (two) times daily., Disp: , Rfl:    nitrofurantoin (MACRODANTIN) 100 MG capsule, Take 100 mg by mouth daily., Disp: , Rfl:    nitroGLYCERIN (NITROSTAT) 0.4 MG SL tablet, Place 1 tablet (0.4 mg total) under the tongue every 5 (five) minutes as needed for chest pain., Disp: 30 tablet, Rfl: 12   nystatin (MYCOSTATIN/NYSTOP) powder, Apply 1 Application topically daily as needed (for rash/redness)., Disp: , Rfl:    ondansetron (ZOFRAN) 4 MG tablet, Take 1 tablet (4 mg total) by mouth every 6 (six) hours as needed for nausea., Disp: 20 tablet, Rfl: 0   oxybutynin (DITROPAN-XL) 5 MG 24 hr tablet, Take 5 mg by mouth at bedtime., Disp: , Rfl:    oxyCODONE (OXY IR/ROXICODONE) 5 MG immediate release tablet, Take 1 tablet (5 mg total) by mouth every 6 (six) hours as needed for severe pain. (Patient taking differently: Take 5 mg by mouth in the morning, at noon, in the evening, and at bedtime.), Disp: 10 tablet, Rfl: 0   pantoprazole (PROTONIX) 20 MG tablet, Take 20 mg by mouth daily., Disp: , Rfl:    polyethylene glycol (MIRALAX / GLYCOLAX) 17 g packet, Take 17 g by mouth daily. (Patient taking differently: Take 17 g by mouth 2 (two) times daily.), Disp: 14 each, Rfl: 0   polyvinyl alcohol (LIQUIFILM TEARS) 1.4 % ophthalmic solution, Place 1 drop into both eyes daily as needed for dry eyes. (Patient not taking: Reported on 03/09/2023), Disp: , Rfl:    REFRESH 1.4-0.6 % SOLN, Place 1 drop into both eyes in the morning and at bedtime., Disp: , Rfl:    senna-docusate (SENOKOT-S) 8.6-50 MG tablet, Take  1 tablet by mouth at bedtime., Disp: , Rfl:   Review of Symptoms: Complete 10-system  review is negative except as above in Interval History.  Physical Exam: BP (!) 144/58 (BP Location: Left Arm, Patient Position: Sitting) Comment: patient request no recheck  Pulse 69   Temp 97.8 F (36.6 C) (Oral)   Resp 19   Wt 169 lb 9.6 oz (76.9 kg)   LMP  (LMP Unknown)   SpO2 99%   BMI 32.05 kg/m  General: Alert, oriented, no acute distress. HEENT: Normocephalic, atraumatic. Neck symmetric without masses. Sclera anicteric.  Chest: Normal work of breathing. Clear to auscultation bilaterally.   Cardiovascular: Regular rate and rhythm, no murmurs. Abdomen: Soft, nontender.  Normoactive bowel sounds.  No masses appreciated.  Well-healing incisions.  Erythema surrounding each incision that appears consistent with Dermabond reaction.  Adhesive remover applied to remove Dermabond from all incisions. Extremities: Grossly normal range of motion.  Warm, well perfused.  Skin: Dermabond reaction as above  Laboratory & Radiologic Studies: Surgical pathology (03/27/23): FINAL MICROSCOPIC DIAGNOSIS:   A. OVARY, RIGHT, OOPHORECTOMY:  Right ovary      Serous cystadenofibroma.      Benign follicular cyst.      Negative for borderline change or malignancy.   Right fallopian tube      Unremarkable.      Negative for endometriosis or malignancy.   Cytology (03/27/23): FINAL MICROSCOPIC DIAGNOSIS:  - No malignant cells identified

## 2023-04-09 NOTE — Patient Instructions (Signed)
It was a pleasure to see you in clinic today. - Okay to use hydrocortisone cream on area of red/itching but try to avoid directly rubbing on the incisions - Okay to return to routine care.  Thank you very much for allowing me to provide care for you today.  I appreciate your confidence in choosing our Gynecologic Oncology team at [facility].  If you have any questions about your visit today please call our office or send [place] a MyChart message and we will get back to you as soon as possible.

## 2023-04-11 ENCOUNTER — Telehealth: Payer: Self-pay | Admitting: *Deleted

## 2023-04-11 NOTE — Telephone Encounter (Signed)
Received a call from [facility] nurse that pt's allergic response on her abdominal incisions are red and itchy but Pt denies them worsening since her last visit with Dr. Alvester Morin on Monday. Pt has no concerns or questions at this time.

## 2023-05-08 ENCOUNTER — Telehealth: Payer: Self-pay

## 2023-05-08 NOTE — Telephone Encounter (Signed)
Our office received order console report [id number] from [facility] for [facility] To sign.   Forms reviewed and faxed back to [employer] Fax number: [phone]

## 2023-08-08 ENCOUNTER — Telehealth: Payer: Self-pay | Admitting: Cardiology

## 2023-08-08 NOTE — Telephone Encounter (Addendum)
 Patient identification verified by 2 forms. Marilynn Rail, RN    Called and spoke to patient  Patient states:    -she is having SOB, started 6 weeks ago   -she stays in an assisted living   -noticed she has to sit and catch breath when walking down halls   -feels like SOB is increasing   -breathing heavier when getting ready in the morning  Patient denies:   -Swelling   -weight gain   -chest pain  Patient scheduled for OV 3/14 at 8:00am  Reviewed ED warning signs/precautions  Patient verbalized understanding, no questions at this time

## 2023-08-08 NOTE — Telephone Encounter (Signed)
 Pt c/o Shortness Of Breath: STAT if SOB developed within the last 24 hours or pt is noticeably SOB on the phone  1. Are you currently SOB (can you hear that pt is SOB on the phone)? No  2. How long have you been experiencing SOB? x6wks  3. Are you SOB when sitting or when up moving around? Up moving around  4. Are you currently experiencing any other symptoms? Fatigue, headache

## 2023-08-10 ENCOUNTER — Ambulatory Visit: Attending: Physician Assistant | Admitting: Physician Assistant

## 2023-08-10 ENCOUNTER — Encounter: Payer: Self-pay | Admitting: Physician Assistant

## 2023-08-10 VITALS — BP 124/62 | HR 83 | Ht 60.5 in | Wt 164.0 lb

## 2023-08-10 DIAGNOSIS — I10 Essential (primary) hypertension: Secondary | ICD-10-CM | POA: Diagnosis present

## 2023-08-10 DIAGNOSIS — I2510 Atherosclerotic heart disease of native coronary artery without angina pectoris: Secondary | ICD-10-CM | POA: Diagnosis present

## 2023-08-10 DIAGNOSIS — I5022 Chronic systolic (congestive) heart failure: Secondary | ICD-10-CM

## 2023-08-10 DIAGNOSIS — R0602 Shortness of breath: Secondary | ICD-10-CM | POA: Diagnosis not present

## 2023-08-10 DIAGNOSIS — Z79899 Other long term (current) drug therapy: Secondary | ICD-10-CM

## 2023-08-10 DIAGNOSIS — I739 Peripheral vascular disease, unspecified: Secondary | ICD-10-CM

## 2023-08-10 DIAGNOSIS — I872 Venous insufficiency (chronic) (peripheral): Secondary | ICD-10-CM

## 2023-08-10 DIAGNOSIS — E785 Hyperlipidemia, unspecified: Secondary | ICD-10-CM | POA: Diagnosis present

## 2023-08-10 NOTE — Patient Instructions (Signed)
 Medication Instructions:  NO CHANGES *If you need a refill on your cardiac medications before your next appointment, please call your pharmacy*   Lab Work: CBC+DIFF,BMP,BNP,D-DIMER, AND TSH TODAY If you have labs (blood work) drawn today and your tests are completely normal, you will receive your results only by: MyChart Message (if you have MyChart) OR A paper copy in the mail If you have any lab test that is abnormal or we need to change your treatment, we will call you to review the results.   Testing/Procedures:[address] Your physician has requested that you have an echocardiogram. Echocardiography is a painless test that uses sound waves to create images of your heart. It provides your doctor with information about the size and shape of your heart and how well your heart's chambers and valves are working. This procedure takes approximately one hour. There are no restrictions for this procedure. Please do NOT wear cologne, perfume, aftershave, or lotions (deodorant is allowed). Please arrive 15 minutes prior to your appointment time.  Please note: We ask at that you not bring children with you during ultrasound (echo/ vascular) testing. Due to room size and safety concerns, children are not allowed in the ultrasound rooms during exams. Our front office staff cannot provide observation of children in our lobby area while testing is being conducted. An adult accompanying a patient to their appointment will only be allowed in the ultrasound room at the discretion of the ultrasound technician under special circumstances. We apologize for any inconvenience.    Follow-Up: At [facility], you and your health needs are our priority.  As part of our continuing mission to provide you with exceptional heart care, we have created designated Provider Care Teams.  These Care Teams include your primary Cardiologist (physician) and Advanced Practice Providers (APPs -  Physician  Assistants and Nurse Practitioners) who all work together to provide you with the care you need, when you need it.  Your next appointment:   4 week(s)  Provider:   Azalee Course, PA    Other Instructions

## 2023-08-10 NOTE — Progress Notes (Signed)
 Cardiology Office Note:  .   Date:  08/10/2023  ID:  Amber [name], DOB February 17, 1941, MRN [id number] PCP: [facility] Providers Cardiologist:  Olga Millers, MD     History of Present Illness: .   Amber [name] is a 83 y.o. female with PMH of nonobstructive CAD on cath 11/23, HFmrEF, PAD, carotid artery disease, LBBB, chronic venous insufficiency, hypertension, hyperlipidemia and prediabetes.  She was admitted to the hospital in November 2023 with altered mental status, decreased p.o. intake and was found to be hypotensive.  She was found to have E. coli bacteremia.  A CT scan consistent with pyelonephritis, incidental finding of atherosclerotic calcification of the aorta and branch vessels.  She was treated with antibiotic.  Hospital course complicated by AKI and elevated lactic acid.  Echocardiogram at the time showed EF borderline low at 45 to 50%, no regional wall motion abnormality, normal RV function, no significant valve disease.  During the hospitalization, she had elevation of the troponin.  She subsequently underwent cardiac catheterization on 04/10/2022 that showed 20% mid LAD, 30% mid left circumflex artery, 20% mid to distal left circumflex artery disease, 30% proximal RCA lesion, LVEDP 24 mmHg, medical therapy was recommended.  She was treated with IV diuretic.  I saw the patient for posthospital follow-up in December 2023.  She presented in the ED in August 2024 with chest pain shortness of breath and found to be hypertensive with a systolic blood pressure of 200.  Troponin negative x 2.  Potassium 2.7.  CTA was negative for PE despite bilateral calf pain.  She was seen in the ED again in September 2024 with complaint of headache and hypertension.  Manual blood pressure 204/122.  CTA of the head and neck negative for large vessel occlusion or other emergent finding.  She left without being seen by ED physician.  She was last seen by Carlos Levering NP  in September 2020 for accompanied by son and daughter-in-law.  She was concerned about her blood pressure spike.  She reported dyspnea with heavy exertion at the time, however not with routine activity.  She did have increased lower extremity edema that was worsened secondary to increased sodium intake.  She was cleared for ovarian cyst surgery and a 32-month follow-up was recommended.  She eventually underwent a robotic assisted right oophorectomy by GYN surgery on 03/27/2023 for adnexal mass.  She contacted cardiology service on 08/08/2023 complaining of shortness of breath that started 6 weeks ago.  She currently resides at the [facility].  Patient presents today for follow-up.  On physical exam, she has crackles in the right base of the lung, she says she recently had a chest x-ray at [facility] facility, however the result is not back yet.  She has occasional chills but no obvious fever.  She denies any significant chest discomfort but does notice occasional chest tightness that is not associated with physical exertion.  She has been noticing increasing shortness of breath for the past 6 weeks.  On physical exam, she appears to be euvolemic without lower extremity edema or significant crackles in the lung.  Only mild crackling in the right base of the lung.  I will hold off on diuresing her.  I recommended initial blood work UA with CBC with differential, basic metabolic panel, BNP, D-dimer, TSH.  We will obtain echocardiogram and to see the patient back in 4 weeks for reassessment.  ROS:   Patient complains of shortness of  breath for the past 6 weeks.  She denies any chest pain, significant lower extremity edema, orthopnea or PND.  Studies Reviewed: .        Cardiac Studies & Procedures   ______________________________________________________________________________________________ CARDIAC CATHETERIZATION  CARDIAC CATHETERIZATION 04/10/2022  Narrative   Mid LAD lesion is 20% stenosed.    Mid Cx lesion is 30% stenosed.   Mid Cx to Dist Cx lesion is 20% stenosed.   Prox RCA-1 lesion is 30% stenosed.   Prox RCA-2 lesion is 20% stenosed.   Mid RCA lesion is 20% stenosed.  Mild nonobstructive CAD with a percent mid LAD stenosis; 30% AV groove circumflex stenosis; and 20 to 30% RCA stenosis in a dominant vessel.  There is moderate mitral annular calcification.  Elevated LVEDP at 24 mm.  RECOMMENDATION: Aspirin 81 mg.  Medical therapy for CAD.  Findings Coronary Findings Diagnostic  Dominance: Right  Left Anterior Descending Mid LAD lesion is 20% stenosed.  Left Circumflex Mid Cx lesion is 30% stenosed. Mid Cx to Dist Cx lesion is 20% stenosed.  Right Coronary Artery Prox RCA-1 lesion is 30% stenosed. Prox RCA-2 lesion is 20% stenosed. Mid RCA lesion is 20% stenosed.  Intervention  No interventions have been documented.     ECHOCARDIOGRAM  ECHOCARDIOGRAM LIMITED 04/08/2022  Narrative ECHOCARDIOGRAM LIMITED REPORT    Patient Name:   Amber [name] Date of Exam: 04/08/2022 Medical Rec #:  [id number]     Height:       62.0 in Accession #:    [id number]    Weight:       175.0 lb Date of Birth:  Jun 17, 1940     BSA:          1.806 m Patient Age:    81 years      BP:           115/52 mmHg Patient Gender: F             HR:           105 bpm. Exam Location:  Inpatient  Procedure: Limited Echo, Cardiac Doppler and Color Doppler  STAT ECHO Reported to: Dr Luane School on 04/08/2022 3:54:00 PM.  Indications:    Pulmonary embolus  History:        Patient has no prior history of Echocardiogram examinations. Arrythmias:LBBB; Risk Factors:Hypertension and Former Smoker. Sepsis. AKI. Elevated troponin.  Sonographer:    Ross Ludwig RDCS (AE) Referring Phys: GRACE E BOWSER  IMPRESSIONS   1. Left ventricular ejection fraction, by estimation, is 45 to 50%. The left ventricle has mildly decreased function. The left ventricle has no regional wall motion  abnormalities. There is moderate left ventricular hypertrophy. Left ventricular diastolic parameters are consistent with Grade II diastolic dysfunction (pseudonormalization). 2. Right ventricular systolic function is normal. The right ventricular size is normal. There is mildly elevated pulmonary artery systolic pressure. 3. The mitral valve is normal in structure. Mild mitral valve regurgitation. No evidence of mitral stenosis. 4. The tricuspid valve is abnormal. Tricuspid valve regurgitation is mild to moderate. 5. The aortic valve is tricuspid. Aortic valve regurgitation is not visualized. No aortic stenosis is present. 6. The inferior vena cava is dilated in size with >50% respiratory variability, suggesting right atrial pressure of 8 mmHg.  Comparison(s): No prior Echocardiogram.  FINDINGS Left Ventricle: Left ventricular ejection fraction, by estimation, is 45 to 50%. The left ventricle has mildly decreased function. The left ventricle has no regional wall motion abnormalities. The left ventricular internal cavity  size was normal in size. There is moderate left ventricular hypertrophy. Abnormal (paradoxical) septal motion, consistent with left bundle branch block. Left ventricular diastolic parameters are consistent with Grade II diastolic dysfunction (pseudonormalization).  Right Ventricle: The right ventricular size is normal. No increase in right ventricular wall thickness. Right ventricular systolic function is normal. There is mildly elevated pulmonary artery systolic pressure. The tricuspid regurgitant velocity is 3.01 m/s, and with an assumed right atrial pressure of 8 mmHg, the estimated right ventricular systolic pressure is 44.2 mmHg.  Left Atrium: Left atrial size was normal in size.  Right Atrium: Right atrial size was normal in size.  Pericardium: There is no evidence of pericardial effusion.  Mitral Valve: The mitral valve is normal in structure. There is moderate  thickening of the mitral valve leaflet(s). Normal mobility of the mitral valve leaflets. Mild mitral valve regurgitation. No evidence of mitral valve stenosis. MV peak gradient, 6.2 mmHg. The mean mitral valve gradient is 4.0 mmHg.  Tricuspid Valve: The tricuspid valve is abnormal. Tricuspid valve regurgitation is mild to moderate. No evidence of tricuspid stenosis.  Aortic Valve: The aortic valve is tricuspid. Aortic valve regurgitation is not visualized. No aortic stenosis is present. Aortic valve mean gradient measures 4.0 mmHg. Aortic valve peak gradient measures 8.1 mmHg. Aortic valve area, by VTI measures 1.61 cm.  Pulmonic Valve: The pulmonic valve was not well visualized. Pulmonic valve regurgitation is not visualized. No evidence of pulmonic stenosis.  Aorta: The aortic root and ascending aorta are structurally normal, with no evidence of dilitation.  Venous: The inferior vena cava is dilated in size with greater than 50% respiratory variability, suggesting right atrial pressure of 8 mmHg.  IAS/Shunts: No atrial level shunt detected by color flow Doppler.  Additional Comments: Spectral Doppler performed. Color Doppler performed.  LEFT VENTRICLE PLAX 2D LVIDd:         4.20 cm   Diastology LVIDs:         3.10 cm   LV e' medial:    5.77 cm/s LV PW:         1.20 cm   LV E/e' medial:  21.7 LV IVS:        1.70 cm   LV e' lateral:   8.16 cm/s LVOT diam:     1.80 cm   LV E/e' lateral: 15.3 LV SV:         34 LV SV Index:   19 LVOT Area:     2.54 cm   IVC IVC diam: 2.20 cm  LEFT ATRIUM         Index LA diam:    3.00 cm 1.66 cm/m AORTIC VALVE AV Area (Vmax):    1.61 cm AV Area (Vmean):   1.71 cm AV Area (VTI):     1.61 cm AV Vmax:           142.00 cm/s AV Vmean:          90.500 cm/s AV VTI:            0.212 m AV Peak Grad:      8.1 mmHg AV Mean Grad:      4.0 mmHg LVOT Vmax:         89.60 cm/s LVOT Vmean:        60.800 cm/s LVOT VTI:          0.134 m LVOT/AV VTI  ratio: 0.63  AORTA Ao Root diam: 2.90 cm Ao Asc diam:  2.80 cm  MITRAL VALVE  TRICUSPID VALVE MV Area (PHT): 4.06 cm     TR Peak grad:   36.2 mmHg MV Area VTI:   1.42 cm     TR Vmax:        301.00 cm/s MV Peak grad:  6.2 mmHg MV Mean grad:  4.0 mmHg     SHUNTS MV Vmax:       1.24 m/s     Systemic VTI:  0.13 m MV Vmean:      92.5 cm/s    Systemic Diam: 1.80 cm MV Decel Time: 187 msec MV E velocity: 125.00 cm/s MV A velocity: 116.00 cm/s MV E/A ratio:  1.08  Vishnu Priya Mallipeddi Electronically signed by Winfield Rast Mallipeddi Signature Date/Time: 04/08/2022/5:21:33 PM    Final          ______________________________________________________________________________________________      Risk Assessment/Calculations:             Physical Exam:   VS:  BP 124/62 (BP Location: Right Arm, Patient Position: Sitting, Cuff Size: Normal)   Pulse 83   Ht 5' 0.5" (1.537 m)   Wt 164 lb (74.4 kg)   LMP  (LMP Unknown)   SpO2 92%   BMI 31.50 kg/m    Wt Readings from Last 3 Encounters:  08/10/23 164 lb (74.4 kg)  04/09/23 169 lb 9.6 oz (76.9 kg)  03/27/23 167 lb (75.8 kg)    GEN: Well nourished, well developed in no acute distress NECK: No JVD; No carotid bruits CARDIAC: RRR, no murmurs, rubs, gallops RESPIRATORY:  Clear to auscultation without rales, wheezing or rhonchi  ABDOMEN: Soft, non-tender, non-distended EXTREMITIES:  No edema; No deformity   ASSESSMENT AND PLAN: .    Shortness of breath Increasing dyspnea over six weeks, particularly with exertion. Differential diagnosis includes cardiac causes such as heart failure, pulmonary causes like infection, or other systemic issues.  - Order CBC with differential, basic metabolic panel, BNP, D-dimer, and TSH. - Order echocardiogram. - Recent CXR, result pending. Request recent chest x-ray results from [facility].  Heart failure with reduced ejection fraction (HFrEF) Previous echocardiogram showed  borderline low ejection fraction (45-50%). Current symptoms could be related to heart failure. Repeat echocardiogram to reassess cardiac function.  Coronary artery disease (CAD) Nonstructural CAD with previous cardiac catheterization showing mild disease. Current symptoms warrant further cardiac evaluation due to history and potential cardiac causes.  Hypertension Previous episodes of significantly elevated blood pressure. Ongoing monitoring and management necessary.  Prediabetes: On atorvastatin        Dispo: Follow-up in 4 weeks for reassessment.  Signed, Azalee Course, PA

## 2023-08-11 LAB — CBC WITH DIFFERENTIAL/PLATELET
Basophils Absolute: 0.1 10*3/uL (ref 0.0–0.2)
Basos: 0 %
EOS (ABSOLUTE): 0.1 10*3/uL (ref 0.0–0.4)
Eos: 1 %
Hematocrit: 35.8 % (ref 34.0–46.6)
Hemoglobin: 12.2 g/dL (ref 11.1–15.9)
Immature Grans (Abs): 0.1 10*3/uL (ref 0.0–0.1)
Immature Granulocytes: 1 %
Lymphocytes Absolute: 1.9 10*3/uL (ref 0.7–3.1)
Lymphs: 11 %
MCH: 32.4 pg (ref 26.6–33.0)
MCHC: 34.1 g/dL (ref 31.5–35.7)
MCV: 95 fL (ref 79–97)
Monocytes Absolute: 1.8 10*3/uL — ABNORMAL HIGH (ref 0.1–0.9)
Monocytes: 11 %
Neutrophils Absolute: 12.9 10*3/uL — ABNORMAL HIGH (ref 1.4–7.0)
Neutrophils: 76 %
Platelets: 186 10*3/uL (ref 150–450)
RBC: 3.77 x10E6/uL (ref 3.77–5.28)
RDW: 13.6 % (ref 11.7–15.4)
WBC: 16.9 10*3/uL — ABNORMAL HIGH (ref 3.4–10.8)

## 2023-08-11 LAB — BASIC METABOLIC PANEL
BUN/Creatinine Ratio: 15 (ref 12–28)
BUN: 12 mg/dL (ref 8–27)
CO2: 19 mmol/L — ABNORMAL LOW (ref 20–29)
Calcium: 9.7 mg/dL (ref 8.7–10.3)
Chloride: 100 mmol/L (ref 96–106)
Creatinine, Ser: 0.79 mg/dL (ref 0.57–1.00)
Glucose: 93 mg/dL (ref 70–99)
Potassium: 4.1 mmol/L (ref 3.5–5.2)
Sodium: 135 mmol/L (ref 134–144)
eGFR: 73 mL/min/{1.73_m2} (ref >59)

## 2023-08-11 LAB — TSH: TSH: 1.22 u[IU]/mL (ref 0.450–4.500)

## 2023-08-11 LAB — BRAIN NATRIURETIC PEPTIDE: BNP: 49.3 pg/mL (ref 0.0–100.0)

## 2023-08-11 LAB — D-DIMER, QUANTITATIVE: D-DIMER: 12.5 mg{FEU}/L — ABNORMAL HIGH (ref 0.00–0.49)

## 2023-08-12 ENCOUNTER — Other Ambulatory Visit: Payer: Self-pay | Admitting: Physician Assistant

## 2023-08-13 ENCOUNTER — Other Ambulatory Visit: Payer: Self-pay

## 2023-08-13 DIAGNOSIS — R0602 Shortness of breath: Secondary | ICD-10-CM

## 2023-08-14 ENCOUNTER — Other Ambulatory Visit: Payer: Self-pay

## 2023-08-14 ENCOUNTER — Encounter (HOSPITAL_COMMUNITY): Payer: Self-pay

## 2023-08-14 ENCOUNTER — Emergency Department (HOSPITAL_COMMUNITY)

## 2023-08-14 ENCOUNTER — Inpatient Hospital Stay (HOSPITAL_COMMUNITY)
Admission: EM | Admit: 2023-08-14 | Discharge: 2023-08-18 | DRG: 189 | Disposition: A | Discharge status: Nursing Home (Includes ICF) | Source: Skilled Nursing Facility | Attending: Family Medicine | Admitting: Family Medicine

## 2023-08-14 ENCOUNTER — Inpatient Hospital Stay (HOSPITAL_COMMUNITY)

## 2023-08-14 ENCOUNTER — Emergency Department (HOSPITAL_BASED_OUTPATIENT_CLINIC_OR_DEPARTMENT_OTHER)
Admit: 2023-08-14 | Discharge: 2023-08-14 | Disposition: A | Discharge status: Home / Self Care | Attending: Emergency Medicine | Admitting: Emergency Medicine

## 2023-08-14 DIAGNOSIS — I110 Hypertensive heart disease with heart failure: Secondary | ICD-10-CM | POA: Diagnosis present

## 2023-08-14 DIAGNOSIS — M5416 Radiculopathy, lumbar region: Secondary | ICD-10-CM | POA: Diagnosis present

## 2023-08-14 DIAGNOSIS — I87009 Postthrombotic syndrome without complications of unspecified extremity: Secondary | ICD-10-CM | POA: Diagnosis present

## 2023-08-14 DIAGNOSIS — R7989 Other specified abnormal findings of blood chemistry: Secondary | ICD-10-CM | POA: Diagnosis present

## 2023-08-14 DIAGNOSIS — Z6832 Body mass index (BMI) 32.0-32.9, adult: Secondary | ICD-10-CM

## 2023-08-14 DIAGNOSIS — E782 Mixed hyperlipidemia: Secondary | ICD-10-CM | POA: Diagnosis present

## 2023-08-14 DIAGNOSIS — R634 Abnormal weight loss: Secondary | ICD-10-CM | POA: Diagnosis present

## 2023-08-14 DIAGNOSIS — I739 Peripheral vascular disease, unspecified: Secondary | ICD-10-CM | POA: Diagnosis present

## 2023-08-14 DIAGNOSIS — I2510 Atherosclerotic heart disease of native coronary artery without angina pectoris: Secondary | ICD-10-CM | POA: Diagnosis present

## 2023-08-14 DIAGNOSIS — E871 Hypo-osmolality and hyponatremia: Secondary | ICD-10-CM | POA: Diagnosis present

## 2023-08-14 DIAGNOSIS — Z90711 Acquired absence of uterus with remaining cervical stump: Secondary | ICD-10-CM

## 2023-08-14 DIAGNOSIS — I10 Essential (primary) hypertension: Secondary | ICD-10-CM | POA: Diagnosis present

## 2023-08-14 DIAGNOSIS — R0600 Dyspnea, unspecified: Secondary | ICD-10-CM | POA: Diagnosis not present

## 2023-08-14 DIAGNOSIS — Z66 Do not resuscitate: Secondary | ICD-10-CM | POA: Diagnosis present

## 2023-08-14 DIAGNOSIS — H3530 Unspecified macular degeneration: Secondary | ICD-10-CM | POA: Diagnosis present

## 2023-08-14 DIAGNOSIS — Z96651 Presence of right artificial knee joint: Secondary | ICD-10-CM | POA: Diagnosis present

## 2023-08-14 DIAGNOSIS — I5022 Chronic systolic (congestive) heart failure: Secondary | ICD-10-CM | POA: Diagnosis not present

## 2023-08-14 DIAGNOSIS — Z86718 Personal history of other venous thrombosis and embolism: Secondary | ICD-10-CM

## 2023-08-14 DIAGNOSIS — Z8619 Personal history of other infectious and parasitic diseases: Secondary | ICD-10-CM

## 2023-08-14 DIAGNOSIS — I447 Left bundle-branch block, unspecified: Secondary | ICD-10-CM | POA: Diagnosis not present

## 2023-08-14 DIAGNOSIS — J189 Pneumonia, unspecified organism: Secondary | ICD-10-CM | POA: Diagnosis present

## 2023-08-14 DIAGNOSIS — E039 Hypothyroidism, unspecified: Secondary | ICD-10-CM | POA: Diagnosis present

## 2023-08-14 DIAGNOSIS — Z8249 Family history of ischemic heart disease and other diseases of the circulatory system: Secondary | ICD-10-CM

## 2023-08-14 DIAGNOSIS — Z833 Family history of diabetes mellitus: Secondary | ICD-10-CM

## 2023-08-14 DIAGNOSIS — I5033 Acute on chronic diastolic (congestive) heart failure: Secondary | ICD-10-CM | POA: Diagnosis present

## 2023-08-14 DIAGNOSIS — Z79899 Other long term (current) drug therapy: Secondary | ICD-10-CM

## 2023-08-14 DIAGNOSIS — K219 Gastro-esophageal reflux disease without esophagitis: Secondary | ICD-10-CM | POA: Diagnosis present

## 2023-08-14 DIAGNOSIS — M1990 Unspecified osteoarthritis, unspecified site: Secondary | ICD-10-CM | POA: Diagnosis present

## 2023-08-14 DIAGNOSIS — Z87891 Personal history of nicotine dependence: Secondary | ICD-10-CM

## 2023-08-14 DIAGNOSIS — N390 Urinary tract infection, site not specified: Secondary | ICD-10-CM | POA: Diagnosis present

## 2023-08-14 DIAGNOSIS — E669 Obesity, unspecified: Secondary | ICD-10-CM | POA: Diagnosis present

## 2023-08-14 DIAGNOSIS — J9601 Acute respiratory failure with hypoxia: Secondary | ICD-10-CM | POA: Diagnosis present

## 2023-08-14 DIAGNOSIS — Z9109 Other allergy status, other than to drugs and biological substances: Secondary | ICD-10-CM

## 2023-08-14 DIAGNOSIS — Z1152 Encounter for screening for COVID-19: Secondary | ICD-10-CM | POA: Diagnosis not present

## 2023-08-14 DIAGNOSIS — Z79890 Hormone replacement therapy (postmenopausal): Secondary | ICD-10-CM

## 2023-08-14 DIAGNOSIS — G8929 Other chronic pain: Secondary | ICD-10-CM | POA: Diagnosis present

## 2023-08-14 DIAGNOSIS — Z90721 Acquired absence of ovaries, unilateral: Secondary | ICD-10-CM

## 2023-08-14 DIAGNOSIS — R0602 Shortness of breath: Secondary | ICD-10-CM | POA: Diagnosis present

## 2023-08-14 DIAGNOSIS — Z7982 Long term (current) use of aspirin: Secondary | ICD-10-CM

## 2023-08-14 DIAGNOSIS — R0609 Other forms of dyspnea: Secondary | ICD-10-CM | POA: Diagnosis not present

## 2023-08-14 DIAGNOSIS — Z882 Allergy status to sulfonamides status: Secondary | ICD-10-CM

## 2023-08-14 DIAGNOSIS — Z91048 Other nonmedicinal substance allergy status: Secondary | ICD-10-CM

## 2023-08-14 DIAGNOSIS — M7989 Other specified soft tissue disorders: Secondary | ICD-10-CM | POA: Diagnosis not present

## 2023-08-14 DIAGNOSIS — N3281 Overactive bladder: Secondary | ICD-10-CM | POA: Diagnosis present

## 2023-08-14 DIAGNOSIS — R6881 Early satiety: Secondary | ICD-10-CM | POA: Diagnosis present

## 2023-08-14 DIAGNOSIS — I252 Old myocardial infarction: Secondary | ICD-10-CM

## 2023-08-14 DIAGNOSIS — R7303 Prediabetes: Secondary | ICD-10-CM | POA: Diagnosis present

## 2023-08-14 DIAGNOSIS — Z885 Allergy status to narcotic agent status: Secondary | ICD-10-CM

## 2023-08-14 DIAGNOSIS — Z9049 Acquired absence of other specified parts of digestive tract: Secondary | ICD-10-CM

## 2023-08-14 LAB — BLOOD GAS, VENOUS
Acid-Base Excess: 2.6 mmol/L — ABNORMAL HIGH (ref 0.0–2.0)
Bicarbonate: 26.2 mmol/L (ref 20.0–28.0)
Drawn by: 66347
O2 Saturation: 93.3 %
Patient temperature: 37
pCO2, Ven: 36 mmHg — ABNORMAL LOW (ref 44–60)
pH, Ven: 7.47 — ABNORMAL HIGH (ref 7.25–7.43)
pO2, Ven: 59 mmHg — ABNORMAL HIGH (ref 32–45)

## 2023-08-14 LAB — COMPREHENSIVE METABOLIC PANEL
ALT: 92 U/L — ABNORMAL HIGH (ref 0–44)
AST: 129 U/L — ABNORMAL HIGH (ref 15–41)
Albumin: 3.4 g/dL — ABNORMAL LOW (ref 3.5–5.0)
Alkaline Phosphatase: 119 U/L (ref 38–126)
Anion gap: 9 (ref 5–15)
BUN: 12 mg/dL (ref 8–23)
CO2: 24 mmol/L (ref 22–32)
Calcium: 9.2 mg/dL (ref 8.9–10.3)
Chloride: 99 mmol/L (ref 98–111)
Creatinine, Ser: 0.66 mg/dL (ref 0.44–1.00)
GFR, Estimated: >60 mL/min (ref >60)
Glucose, Bld: 92 mg/dL (ref 70–99)
Potassium: 4 mmol/L (ref 3.5–5.1)
Sodium: 132 mmol/L — ABNORMAL LOW (ref 135–145)
Total Bilirubin: 1.5 mg/dL — ABNORMAL HIGH (ref 0.0–1.2)
Total Protein: 7.2 g/dL (ref 6.5–8.1)

## 2023-08-14 LAB — CBC
HCT: 35.6 % — ABNORMAL LOW (ref 36.0–46.0)
Hemoglobin: 12 g/dL (ref 12.0–15.0)
MCH: 31.8 pg (ref 26.0–34.0)
MCHC: 33.7 g/dL (ref 30.0–36.0)
MCV: 94.4 fL (ref 80.0–100.0)
Platelets: 155 10*3/uL (ref 150–400)
RBC: 3.77 MIL/uL — ABNORMAL LOW (ref 3.87–5.11)
RDW: 15.5 % (ref 11.5–15.5)
WBC: 10.7 10*3/uL — ABNORMAL HIGH (ref 4.0–10.5)
nRBC: 0 % (ref 0.0–0.2)

## 2023-08-14 LAB — URINALYSIS, W/ REFLEX TO CULTURE (INFECTION SUSPECTED)
Bacteria, UA: NONE SEEN
Bilirubin Urine: NEGATIVE
Glucose, UA: NEGATIVE mg/dL
Hgb urine dipstick: NEGATIVE
Ketones, ur: NEGATIVE mg/dL
Nitrite: NEGATIVE
Protein, ur: NEGATIVE mg/dL
Specific Gravity, Urine: 1.03 (ref 1.005–1.030)
pH: 6 (ref 5.0–8.0)

## 2023-08-14 LAB — TROPONIN I (HIGH SENSITIVITY)
Troponin I (High Sensitivity): 5 ng/L (ref <18)
Troponin I (High Sensitivity): 6 ng/L (ref <18)

## 2023-08-14 LAB — BRAIN NATRIURETIC PEPTIDE: B Natriuretic Peptide: 55.5 pg/mL (ref 0.0–100.0)

## 2023-08-14 LAB — PROCALCITONIN: Procalcitonin: <0.1 ng/mL

## 2023-08-14 LAB — MAGNESIUM: Magnesium: 1.6 mg/dL — ABNORMAL LOW (ref 1.7–2.4)

## 2023-08-14 LAB — PHOSPHORUS: Phosphorus: 4 mg/dL (ref 2.5–4.6)

## 2023-08-14 MED ORDER — OXYCODONE HCL 5 MG PO TABS
5.0000 mg | ORAL_TABLET | Freq: Four times a day (QID) | ORAL | Status: DC | PRN
  Administered 2023-08-14 – 2023-08-18 (×13): 5 mg via ORAL
  Filled 2023-08-14 (×13): qty 1

## 2023-08-14 MED ORDER — IOHEXOL 350 MG/ML SOLN
75.0000 mL | Freq: Once | INTRAVENOUS | Status: AC | PRN
  Administered 2023-08-14: 75 mL via INTRAVENOUS

## 2023-08-14 MED ORDER — MAGNESIUM SULFATE 2 GM/50ML IV SOLN
2.0000 g | Freq: Once | INTRAVENOUS | Status: AC
  Administered 2023-08-14: 2 g via INTRAVENOUS
  Filled 2023-08-14: qty 50

## 2023-08-14 NOTE — ED Triage Notes (Signed)
 Pt arrived from facility for shob and abnormal labs

## 2023-08-14 NOTE — Assessment & Plan Note (Signed)
 this patient has acute respiratory failure with Hypoxia a as documented by the presence of following: O2 saturatio< 90% on RA   Likely due to: atypical  Pneumonia vs CHF exacerbation,   Provide O2 therapy and titrate as needed  Continuous pulse ox   check Pulse ox with ambulation prior to discharge   may need  TC consult for home O2 set up    flutter valve ordered Pro calcitonin   unremarkable bacterial pneumonia less likely Could be viral illness COVID influenza and RSV negative If persists may benefit from pulmonology follow-up to further evaluate

## 2023-08-14 NOTE — ED Provider Notes (Signed)
 Patient signed to me Dr. Lynelle Doctor pending results of CT angio chest which showed no evidence of PE.  Patient was ambulated on room air pulse ox and her sats dropped to 85%.  Concern for possible UTI urinalysis negative.  Will admit for further workup   Lorre Nick, MD 08/14/23 2132

## 2023-08-14 NOTE — H&P (Signed)
 Amber [name] YQM:[id number] DOB: 06-01-1940 DOA: 08/14/2023     PCP: [facility]   Outpatient Specialists:   CARDS:  Dr. Olga Millers, MD    Patient arrived to ER on 08/14/23 at 1152 Referred by Attending Lorre Nick, MD   Patient coming from:   [facility], [place]  Chief Complaint:   Chief Complaint  Patient presents with   Shortness of Breath    HPI: Amber [name] is a 83 y.o. female with medical history significant of nonobstructive CAD, CHF, peripheral vascular disease carotid artery disease left bundle branch block, venous insufficiency, HTN, HLD, prediabetes    Presented with  shortness of breath Presents with worsening shortness of breath that has been ongoing for the past 6 weeks she was seen by cardiology last time in March 14 but her symptoms continued to progress she also endorsed occasional coughing no fevers  Patient felt some discomfort in her left thigh became concerned and wanted to get evaluated for DVT as she had them in the past presented to ER   Patient have had some chest tightness although no chest pain no leg edema Reports early satiety lost 20 lb over past year She has history of ovarian cyst which was found to be benign and that was operated on in October  Denies  CP  Denies significant ETOH intake   Does not smoke,  Lab Results  Component Value Date   SARSCOV2NAA Not Detected 06/07/2020      Regarding pertinent Chronic problems:    Hyperlipidemia -  on statins Lipitor (atorvastatin)  Lipid Panel     Component Value Date/Time   CHOL 136 03/15/2021 1139   CHOL 133 03/17/2020 1158   TRIG 100.0 03/15/2021 1139   HDL 65.20 03/15/2021 1139   HDL 53 03/17/2020 1158   CHOLHDL 2 03/15/2021 1139   VLDL 20.0 03/15/2021 1139   LDLCALC 51 03/15/2021 1139   LDLCALC 59 03/17/2020 1158   LABVLDL 21 03/17/2020 1158     HTN on norvasc    chronic CHF  systolic - last echo 2023 showed EF 45-50%     CAD  - On  Aspirin, statin,                  - followed by cardiology                 last cardiac cath 2023      Hx of DVT/PE no longer on  anticoagulation     While in ER: Clinical Course as of 08/14/23 2141  Tue Aug 14, 2023  1159 Outpatient labs reviewed from March 14.  Patient had D-dimer of 12.5.  BMP was normal.  CBC shows elevated white blood cell count [JK]  1247 Doppler study negative for DVT [JK]    Clinical Course User Index [JK] Linwood Dibbles, MD       Lab Orders         Comprehensive metabolic panel         CBC         Brain natriuretic peptide         Urinalysis, w/ Reflex to Culture (Infection Suspected) -Urine, Clean Catch        CXR -  NON acute    CTA chest -  Diffuse bilateral interlobular septal thickening and patchy ground-glass opacities may be due to pulmonary edema or atypical infection  Following Medications were ordered in ER: Medications  oxyCODONE (Oxy IR/ROXICODONE)  immediate release tablet 5 mg (5 mg Oral Given 08/14/23 1659)  iohexol (OMNIPAQUE) 350 MG/ML injection 75 mL (75 mLs Intravenous Contrast Given 08/14/23 1726)       ED Triage Vitals  Encounter Vitals Group     BP 08/14/23 1203 (!) 152/69     Systolic BP Percentile --      Diastolic BP Percentile --      Pulse Rate 08/14/23 1203 79     Resp 08/14/23 1203 18     Temp 08/14/23 1203 97.9 F (36.6 C)     Temp Source 08/14/23 1203 Oral     SpO2 08/14/23 1203 97 %     Weight 08/14/23 1221 164 lb (74.4 kg)     Height 08/14/23 1221 5' (1.524 m)     Head Circumference --      Peak Flow --      Pain Score 08/14/23 1222 0     Pain Loc --      Pain Education --      Exclude from Growth Chart --   ZOXW(96)@     _________________________________________ Significant initial  Findings: Abnormal Labs Reviewed  COMPREHENSIVE METABOLIC PANEL - Abnormal; Notable for the following components:      Result Value   Sodium 132 (*)    Albumin 3.4 (*)    AST 129 (*)    ALT 92 (*)    Total Bilirubin 1.5  (*)    All other components within normal limits  CBC - Abnormal; Notable for the following components:   WBC 10.7 (*)    RBC 3.77 (*)    HCT 35.6 (*)    All other components within normal limits  URINALYSIS, W/ REFLEX TO CULTURE (INFECTION SUSPECTED) - Abnormal; Notable for the following components:   Color, Urine STRAW (*)    Leukocytes,Ua SMALL (*)    All other components within normal limits    _________________________ Troponin   Cardiac Panel (last 3 results) Recent Labs    08/14/23 1218 08/14/23 2003  TROPONINIHS 5 6    ECG: Ordered Personally reviewed and interpreted by me showing: HR : 81 Rhythm: Sinus rhythm Left bundle branch block No significant change since last tracing QTC 494  BNP (last 3 results) Recent Labs    08/10/23 0918 08/14/23 1218  BNP 49.3 55.5     COVID-19 Labs  No results for input(s): "DDIMER", "FERRITIN", "LDH", "CRP" in the last 72 hours.  Lab Results  Component Value Date   SARSCOV2NAA Not Detected 06/07/2020      The recent clinical data is shown below. Vitals:   08/14/23 2002 08/14/23 2015 08/14/23 2030 08/14/23 2045  BP: (!) 156/77 (!) 147/75 139/68 (!) 153/72  Pulse: 100 96 100 99  Resp: (!) 22 20 16  (!) 21  Temp:      TempSrc:      SpO2: 94% 95% 94% 93%  Weight:      Height:        WBC     Component Value Date/Time   WBC 10.7 (H) 08/14/2023 1218   LYMPHSABS 1.9 08/10/2023 0918   MONOABS 1.0 01/30/2023 1922   EOSABS 0.1 08/10/2023 0918   BASOSABS 0.1 08/10/2023 0918    Procalcitonin   Ordered      UA   no evidence of UTI     Urine analysis:    Component Value Date/Time   COLORURINE STRAW (A) 08/14/2023 2026   APPEARANCEUR CLEAR 08/14/2023 2026   LABSPEC 1.030 08/14/2023  2026   PHURINE 6.0 08/14/2023 2026   GLUCOSEU NEGATIVE 08/14/2023 2026   HGBUR NEGATIVE 08/14/2023 2026   BILIRUBINUR NEGATIVE 08/14/2023 2026   KETONESUR NEGATIVE 08/14/2023 2026   PROTEINUR NEGATIVE 08/14/2023 2026   UROBILINOGEN  0.2 09/01/2014 1055   NITRITE NEGATIVE 08/14/2023 2026   LEUKOCYTESUR SMALL (A) 08/14/2023 2026    Results for orders placed or performed in visit on 06/05/22  Culture, blood (single) w Reflex to ID Panel     Status: None   Collection Time: 06/05/22  9:29 AM   Specimen: Blood  Result Value Ref Range Status   MICRO NUMBER: [id number]  Final   SPECIMEN QUALITY: Adequate  Final   Source BLOOD 1  Final   STATUS: FINAL  Final   Result: No growth after 5 days  Final   COMMENT: Aerobic and anaerobic bottle received.  Final  Culture, blood (single) w Reflex to ID Panel     Status: None   Collection Time: 06/05/22  9:29 AM   Specimen: Blood  Result Value Ref Range Status   MICRO NUMBER: [id number]  Final   SPECIMEN QUALITY: Adequate  Final   Source BLOOD 2  Final   STATUS: FINAL  Final   Result: No growth after 5 days  Final   COMMENT: Aerobic and anaerobic bottle received.  Final    ABX started Antibiotics Given (last 72 hours)     None       No results found for the last 90 days.   __________________________________________________________ Recent Labs  Lab 08/10/23 0918 08/14/23 1218 08/14/23 2003  NA 135 132*  --   K 4.1 4.0  --   CO2 19* 24  --   GLUCOSE 93 92  --   BUN 12 12  --   CREATININE 0.79 0.66  --   CALCIUM 9.7 9.2  --   MG  --   --  1.6*  PHOS  --   --  4.0    Cr  stable,    Lab Results  Component Value Date   CREATININE 0.66 08/14/2023   CREATININE 0.79 08/10/2023   CREATININE 0.90 03/29/2023    Recent Labs  Lab 08/14/23 1218  AST 129*  ALT 92*  ALKPHOS 119  BILITOT 1.5*  PROT 7.2  ALBUMIN 3.4*   Lab Results  Component Value Date   CALCIUM 9.2 08/14/2023   PHOS 3.5 04/16/2022       Plt: Lab Results  Component Value Date   PLT 155 08/14/2023       Recent Labs  Lab 08/10/23 0918 08/14/23 1218  WBC 16.9* 10.7*  NEUTROABS 12.9*  --   HGB 12.2 12.0  HCT 35.8 35.6*  MCV 95 94.4  PLT 186 155    HG/HCT  stable,       Component Value Date/Time   HGB 12.0 08/14/2023 1218   HGB 12.2 08/10/2023 0918   HCT 35.6 (L) 08/14/2023 1218   HCT 35.8 08/10/2023 0918   MCV 94.4 08/14/2023 1218   MCV 95 08/10/2023 0918    _______________________________________________ Hospitalist was called for admission for  hypoxia   The following Work up has been ordered so far:  Orders Placed This Encounter  Procedures   DG Chest Port 1 View   CT Angio Chest PE W and/or Wo Contrast   Comprehensive metabolic panel   CBC   Brain natriuretic peptide   Urinalysis, w/ Reflex to Culture (Infection Suspected) -Urine, Clean Catch   ED Cardiac  monitoring   Initiate Carrier Fluid Protocol   Consult to hospitalist   EKG 12-Lead   Saline lock IV   VAS [place] LOWER EXTREMITY VENOUS (DVT) (7a-7p)     OTHER Significant initial  Findings:  labs showing:     DM  labs:  HbA1C: No results for input(s): "HGBA1C" in the last 8760 hours.     CBG (last 3)  No results for input(s): "GLUCAP" in the last 72 hours.        Cultures:    Component Value Date/Time   SDES BLOOD LEFT HAND 04/08/2022 1707   SPECREQUEST  04/08/2022 1707    BOTTLES DRAWN AEROBIC AND ANAEROBIC Blood Culture results may not be optimal due to an inadequate volume of blood received in culture bottles   CULT  04/08/2022 1707    NO GROWTH 5 DAYS Performed at [facility], [address] [place], [place] [address]    REPTSTATUS 04/13/2022 FINAL 04/08/2022 1707     Radiological Exams on Admission: CT Angio Chest PE W and/or Wo Contrast Result Date: 08/14/2023 CLINICAL DATA:  Shortness of breath. Positive D-dimer. PE suspected EXAM: CT ANGIOGRAPHY CHEST WITH CONTRAST TECHNIQUE: Multidetector CT imaging of the chest was performed using the standard protocol during bolus administration of intravenous contrast. Multiplanar CT image reconstructions and MIPs were obtained to evaluate the vascular anatomy. RADIATION DOSE REDUCTION: This exam was performed  according to the departmental dose-optimization program which includes automated exposure control, adjustment of the mA and/or kV according to patient size and/or use of iterative reconstruction technique. CONTRAST:  75mL OMNIPAQUE IOHEXOL 350 MG/ML SOLN COMPARISON:  Radiograph 08/14/2023 and CTA chest 01/18/2023 FINDINGS: Cardiovascular: No pericardial effusion. Normal caliber aorta without dissection. Aortic and coronary artery atherosclerotic calcification. Negative for acute pulmonary embolism. Mediastinum/Nodes: Small hiatal hernia. Trachea is unremarkable. Subcentimeter mediastinal and hilar lymph nodes are likely reactive. Lungs/Pleura: Diffuse bilateral interlobular septal thickening and patchy ground-glass opacities. No pleural effusion or pneumothorax. Upper Abdomen: No acute abnormality. Musculoskeletal: No acute fracture. Review of the MIP images confirms the above findings. IMPRESSION: 1. Negative for acute pulmonary embolism. 2. Diffuse bilateral interlobular septal thickening and patchy ground-glass opacities may be due to pulmonary edema or atypical infection 3. Aortic Atherosclerosis (ICD10-I70.0). Electronically Signed   By: Minerva Fester M.D.   On: 08/14/2023 19:26   VAS [place] LOWER EXTREMITY VENOUS (DVT) (7a-7p) Result Date: 08/14/2023  Lower Venous DVT Study Patient Name:  Amber [name]  Date of Exam:   08/14/2023 Medical Rec #: [id number]      Accession #:    [id number] Date of Birth: 12/02/1940      Patient Gender: F Patient Age:   68 years Exam Location:  [facility] Procedure:      VAS [place] LOWER EXTREMITY VENOUS (DVT) Referring Phys: JON KNAPP --------------------------------------------------------------------------------  Indications: Swelling.  Risk Factors: None identified. Comparison Study: No prior studies. Performing Technologist: Chanda Busing RVT  Examination Guidelines: A complete evaluation includes B-mode imaging, spectral Doppler, color Doppler, and power Doppler as  needed of all accessible portions of each vessel. Bilateral testing is considered an integral part of a complete examination. Limited examinations for reoccurring indications may be performed as noted. The reflux portion of the exam is performed with the patient in reverse Trendelenburg.  +-----+---------------+---------+-----------+----------+--------------+ RIGHTCompressibilityPhasicitySpontaneityPropertiesThrombus Aging +-----+---------------+---------+-----------+----------+--------------+ CFV  Full           Yes      Yes                                 +-----+---------------+---------+-----------+----------+--------------+   +---------+---------------+---------+-----------+----------+--------------+  LEFT     CompressibilityPhasicitySpontaneityPropertiesThrombus Aging +---------+---------------+---------+-----------+----------+--------------+ CFV      Full           Yes      Yes                                 +---------+---------------+---------+-----------+----------+--------------+ SFJ      Full                                                        +---------+---------------+---------+-----------+----------+--------------+ FV Prox  Full                                                        +---------+---------------+---------+-----------+----------+--------------+ FV Mid   Full                                                        +---------+---------------+---------+-----------+----------+--------------+ FV DistalFull                                                        +---------+---------------+---------+-----------+----------+--------------+ PFV      Full                                                        +---------+---------------+---------+-----------+----------+--------------+ POP      Full           Yes      Yes                                 +---------+---------------+---------+-----------+----------+--------------+ PTV       Full                                                        +---------+---------------+---------+-----------+----------+--------------+ PERO     Full                                                        +---------+---------------+---------+-----------+----------+--------------+    Summary: RIGHT: - No evidence of common femoral vein obstruction.   LEFT: - There is no evidence of deep vein thrombosis in the lower extremity.  - No cystic structure found in the popliteal fossa.  *See table(s) above for measurements and observations.  Electronically signed by Lemar Livings MD on 08/14/2023 at 3:44:36 PM.    Final    DG Chest Port 1 View Result Date: 08/14/2023 CLINICAL DATA:  Dyspnea. EXAM: PORTABLE CHEST 1 VIEW COMPARISON:  January 18, 2023. FINDINGS: The heart size and mediastinal contours are within normal limits. Both lungs are clear. The visualized skeletal structures are unremarkable. IMPRESSION: No active disease. Electronically Signed   By: Lupita Raider M.D.   On: 08/14/2023 14:56   _______________________________________________________________________________________________________ Latest  Blood pressure (!) 153/72, pulse 99, temperature 98.7 F (37.1 C), temperature source Oral, resp. rate (!) 21, height 5' (1.524 m), weight 74.4 kg, SpO2 93%.   Vitals  labs and radiology finding personally reviewed  Review of Systems:    Pertinent positives include:  fatigue, shortness of breath at rest.   dyspnea on exertion   Constitutional:  No weight loss, night sweats, Fevers, chills, weight loss  HEENT:  No headaches, Difficulty swallowing,Tooth/dental problems,Sore throat,  No sneezing, itching, ear ache, nasal congestion, post nasal drip,  Cardio-vascular:  No chest pain, Orthopnea, PND, anasarca, dizziness, palpitations.no Bilateral lower extremity swelling  GI:  No heartburn, indigestion, abdominal pain, nausea, vomiting, diarrhea, change in bowel habits, loss of appetite,  melena, blood in stool, hematemesis Resp:  no , No excess mucus, no productive cough, No non-productive cough, No coughing up of blood.No change in color of mucus.No wheezing. Skin:  no rash or lesions. No jaundice GU:  no dysuria, change in color of urine, no urgency or frequency. No straining to urinate.  No flank pain.  Musculoskeletal:  No joint pain or no joint swelling. No decreased range of motion. No back pain.  Psych:  No change in mood or affect. No depression or anxiety. No memory loss.  Neuro: no localizing neurological complaints, no tingling, no weakness, no double vision, no gait abnormality, no slurred speech, no confusion  All systems reviewed and apart from HOPI all are negative _______________________________________________________________________________________________ Past Medical History:   Past Medical History:  Diagnosis Date   Arthritis    Coronary artery disease    DVT (deep venous thrombosis) (HCC)    Family history of blood clots    Hx of blood clots    Hypertension    Hypothyroidism    Left bundle branch block    Macular degeneration    Peripheral vascular disease (HCC)    Pneumonia    Post-phlebitic syndrome 04/18/2013   Rotator cuff rupture, complete 07/05/2011   Varicose veins       Past Surgical History:  Procedure Laterality Date   APPENDECTOMY  1979   BACK SURGERY  2009   ENDOVENOUS ABLATION SAPHENOUS VEIN W/ LASER Right 09/15/2013   right greater saphenous vein, by Gretta Began MD   ENDOVENOUS ABLATION SAPHENOUS VEIN W/ LASER Left 10/16/2013   left greater saphenous vein, by Gretta Began MD   LEFT HEART CATH AND CORONARY ANGIOGRAPHY N/A 04/10/2022   Procedure: LEFT HEART CATH AND CORONARY ANGIOGRAPHY;  Surgeon: Lennette Bihari, MD;  Location: [facility];  Service: Cardiovascular;  Laterality: N/A;   left open rotator cuff surgery   2010   lumbar infusion  01/16/2008   PARTIAL HYSTERECTOMY  1979   TAH pfannenstiel, fibroids    ROBOTIC ASSISTED SALPINGO OOPHERECTOMY Bilateral 03/27/2023   Procedure: XI ROBOTIC ASSISTED RIGHT OOPHORECTOMY;  Surgeon: Clide Cliff, MD;  Location: [facility];  Service: Gynecology;  Laterality: Bilateral;   SHOULDER OPEN ROTATOR CUFF REPAIR  07/05/2011   Procedure: ROTATOR  CUFF REPAIR SHOULDER OPEN;  Surgeon: Jacki Cones, MD;  Location: [facility];  Service: Orthopedics;  Laterality: Right;   TOTAL KNEE ARTHROPLASTY Right 09/09/2014   Procedure: TOTAL RIGHT  KNEE ARTHROPLASTY;  Surgeon: Ranee Gosselin, MD;  Location: [facility];  Service: Orthopedics;  Laterality: Right;    Social History:  Ambulatory   walker        reports that she quit smoking about 59 years ago. Her smoking use included cigarettes. She started smoking about 72 years ago. She has a 3.3 pack-year smoking history. She has never used smokeless tobacco. She reports that she does not drink alcohol and does not use drugs.   Family History:   Family History  Problem Relation Age of Onset   Heart disease Mother    Esophageal cancer Mother    Varicose Veins Mother    Deep vein thrombosis Mother    Diabetes Mother    Hypertension Mother    Depression Mother    Obesity Mother    Heart disease Father    AAA (abdominal aortic aneurysm) Father    Varicose Veins Sister    Lung cancer Sister    Diabetes Sister    Heart disease Sister 72   Hypertension Sister    Vision loss Sister    Diabetes Sister    Heart disease Sister    Hypertension Sister    Vision loss Sister    Diabetes Sister    Vision loss Sister    Diabetes Brother    Vision loss Brother    Peripheral vascular disease Brother    AAA (abdominal aortic aneurysm) Brother    Breast cancer Neg Hx    Ovarian cancer Neg Hx    Endometrial cancer Neg Hx    Colon cancer Neg Hx    ______________________________________________________________________________________________ Allergies: Allergies  Allergen Reactions   Sulfa Antibiotics Rash    Like a sunburn    Codeine Nausea And Vomiting   Darvocet [Propoxyphene N-Acetaminophen] Nausea And Vomiting   Nickel Rash   Other Rash    Dermabond   Vicodin [Hydrocodone-Acetaminophen] Other (See Comments)    GI upset     Prior to Admission medications   Medication Sig Start Date End Date Taking? Authorizing Provider  acetaminophen (TYLENOL) 500 MG tablet Take 1,000 mg by mouth 3 (three) times daily.    [provider]  albuterol (VENTOLIN HFA) 108 (90 Base) MCG/ACT inhaler Inhale 2 puffs into the lungs every 4 (four) hours as needed. 08/07/23   [provider]  amLODipine (NORVASC) 5 MG tablet Take 5 mg by mouth daily.    [provider]  aspirin EC 81 MG tablet Take 1 tablet (81 mg total) by mouth daily. Swallow whole. 04/17/22   Marguerita Merles Latif, DO  atorvastatin (LIPITOR) 20 MG tablet Take 20 mg by mouth at bedtime.    [provider]  B Complex Vitamins (VITAMIN-B COMPLEX) TABS Take 1 tablet by mouth daily.     [provider]  calcium carbonate (TUMS EX) 750 MG chewable tablet Chew 1 tablet by mouth every 4 (four) hours as needed for heartburn.    [provider]  Cholecalciferol (VITAMIN D3) 1000 units CAPS Take 1,000 Units by mouth daily.     [provider]  clobetasol ointment (TEMOVATE) 0.05 % Apply 1 Application topically every evening.    [provider]  Cranberry-Vitamin C-Inulin (UTI-STAT) LIQD Take 30 mLs by mouth daily in the afternoon.    [provider]  Cranberry-Vitamin C-Probiotic (AZO CRANBERRY PO) Take 1 tablet by mouth daily.    [provider]  cyclobenzaprine (FLEXERIL) 10 MG tablet Take 1 tablet (10 mg total) by mouth 3 (three) times daily as needed for muscle spasms. 11/11/20   Willow Ora, MD  diclofenac Sodium (VOLTAREN) 1 % GEL Apply 2 g topically 3 (three) times daily as needed (joint pain). 11/14/22   [provider]  fluconazole (DIFLUCAN) 150 MG tablet Take 1 pill at  the first sign of a yeast infection, may repeat in 48 hours if needed 09/22/22   [provider]  gabapentin (NEURONTIN) 100 MG capsule TAKE 1 CAPSULE(100 MG) BY MOUTH THREE TIMES DAILY 02/28/21   Willow Ora, MD  levothyroxine (SYNTHROID) 50 MCG tablet Take 1 tablet (50 mcg total) by mouth daily before breakfast. 11/03/21   Willow Ora, MD  losartan-hydrochlorothiazide (HYZAAR) 100-12.5 MG tablet Take 1 tablet by mouth daily. 03/06/23   [provider]  metoprolol tartrate (LOPRESSOR) 25 MG tablet Take 1 tablet (25 mg total) by mouth 2 (two) times daily. 04/16/22   Sheikh, Omair Latif, DO  MUCUS RELIEF 600 MG 12 hr tablet Take 600 mg by mouth 2 (two) times daily. 08/07/23   [provider]  Multiple Vitamins-Minerals (PRESERVISION AREDS PO) Take 1 tablet by mouth 2 (two) times daily.    [provider]  nitrofurantoin (MACRODANTIN) 100 MG capsule Take 100 mg by mouth daily.    [provider]  nitroGLYCERIN (NITROSTAT) 0.4 MG SL tablet Place 1 tablet (0.4 mg total) under the tongue every 5 (five) minutes as needed for chest pain. Patient not taking: Reported on 08/10/2023 04/16/22   Marguerita Merles Latif, DO  nystatin (MYCOSTATIN/NYSTOP) powder Apply 1 Application topically daily as needed (for rash/redness).    [provider]  ondansetron (ZOFRAN) 4 MG tablet Take 1 tablet (4 mg total) by mouth every 6 (six) hours as needed for nausea. 04/16/22   Marguerita Merles Latif, DO  oxybutynin (DITROPAN-XL) 5 MG 24 hr tablet Take 5 mg by mouth at bedtime. 10/26/22   [provider]  oxyCODONE (OXY IR/ROXICODONE) 5 MG immediate release tablet Take 1 tablet (5 mg total) by mouth every 6 (six) hours as needed for severe pain. 04/16/22   Sheikh, Omair Latif, DO  pantoprazole (PROTONIX) 20 MG tablet Take 20 mg by mouth daily. 02/07/23   [provider]  polyethylene glycol (MIRALAX / GLYCOLAX) 17 g packet Take 17 g by mouth daily. Patient taking  differently: Take 17 g by mouth 2 (two) times daily. 04/16/22   Marguerita Merles Latif, DO  polyvinyl alcohol (LIQUIFILM TEARS) 1.4 % ophthalmic solution Place 1 drop into both eyes daily as needed for dry eyes.    [provider]  potassium chloride SA (KLOR-CON M) 20 MEQ tablet Take 20 mEq by mouth daily. 03/20/23   [provider]  REFRESH 1.4-0.6 % SOLN Place 1 drop into both eyes in the morning and at bedtime. 01/08/23   [provider]  REFRESH TEARS 0.5 % SOLN Apply to eye. 05/11/23   [provider]  senna-docusate (SENOKOT-S) 8.6-50 MG tablet Take 1 tablet by mouth at bedtime.    [provider]  traMADol (ULTRAM) 50 MG tablet Take 50 mg by mouth 2 (two) times daily as needed. 05/15/23   [provider]    ___________________________________________________________________________________________________ Physical Exam:    08/14/2023    8:45 PM 08/14/2023    8:30 PM 08/14/2023  8:15 PM  Vitals with BMI  Systolic 153 139 161  Diastolic 72 68 75  Pulse 99 100 96     1. General:  in No  Acute distress   Chronically ill -appearing 2. Psychological: Alert and  Oriented 3. Head/ENT:    Dry Mucous Membranes                          Head Non traumatic, neck supple                            Poor Dentition 4. SKIN:  decreased Skin turgor,  Skin clean Dry and intact no rash    5. Heart: Regular rate and rhythm no  Murmur, no Rub or gallop 6. Lungs:  no wheezes or crackles   7. Abdomen: Soft,  non-tender, Non distended   obese  bowel sounds present 8. Lower extremities: no clubbing, cyanosis, no  edema 9. Neurologically Grossly intact, moving all 4 extremities equally   10. MSK: Normal range of motion    Chart has been reviewed  ______________________________________________________________________________________________  Assessment/Plan 83 y.o. female with medical history significant of nonobstructive CAD, CHF, peripheral  vascular disease carotid artery disease left bundle branch block, venous insufficiency, HTN, HLD, prediabetes    Admitted for acute respiratory failure with hypoxia   Present on Admission:  Acute respiratory failure with hypoxia (HCC)  Abnormal LFTs  Acquired hypothyroidism  Mixed hyperlipidemia  Coronary artery disease  Essential hypertension  Hypomagnesemia  Lumbar radiculopathy  Chronic systolic CHF (congestive heart failure) (HCC)     Abnormal LFTs Obtain right upper quadrant ultrasound  Acquired hypothyroidism Check TSH continue Synthroid at 50 mcg a day  Acute respiratory failure with hypoxia (HCC)  this patient has acute respiratory failure with Hypoxia a as documented by the presence of following: O2 saturatio< 90% on RA   Likely due to: atypical  Pneumonia vs CHF exacerbation,   Provide O2 therapy and titrate as needed  Continuous pulse ox   check Pulse ox with ambulation prior to discharge   may need  TC consult for home O2 set up    flutter valve ordered Pro calcitonin   unremarkable bacterial pneumonia less likely Could be viral illness COVID influenza and RSV negative If persists may benefit from pulmonology follow-up to further evaluate    Mixed hyperlipidemia Given elevated LFTs we will hold Lipitor  Coronary artery disease Continue Lipitor 40 mg a day   Metoprolol 25 mg twice daily  Essential hypertension Continue Norvasc 5 mg a day hold losartan hydrochlorothiazide given slightly hyponatremia continue Lopressor 25 mg p.o. twice daily  Hypomagnesemia - will replace electrolytes and repeat  check Mg, phos and Ca level and replace as needed Monitor on telemetry   Lab Results  Component Value Date   K 4.0 08/14/2023     Lab Results  Component Value Date   CREATININE 0.66 08/14/2023   Lab Results  Component Value Date   MG 1.6 (L) 08/14/2023   Lab Results  Component Value Date   CALCIUM 9.2 08/14/2023   PHOS 4.0 08/14/2023      Lumbar radiculopathy Continue home medications  Chronic systolic CHF (congestive heart failure) (HCC) Clinically does not appear to be fluid overloaded BNP is unremarkable repeat echogram will need to continue to follow-up with cardiology avoid fluid overload   Other plan as per orders.  DVT prophylaxis:  SCD  Code Status:  DNR/DNI   as per patient  I had personally discussed CODE STATUS with patient   ACP   has been reviewed     Family Communication:   Family not at  Bedside    Diet heart healthy   Disposition Plan:                              Back to current facility when stable                           Following barriers for discharge:                            Hypoxia work up is complete                                                       Will likely need home health, home O2, set up                                 Consult Orders  (From admission, onward)           Start     Ordered   08/14/23 2113  Consult to hospitalist  Once       Provider:  (Not yet assigned)  Question Answer Comment  Place call to: [facility]   Reason for Consult Admit      08/14/23 2112                               Would benefit from PT/OT eval prior to DC  Ordered                                     Transition of care consulted                                     Consults called: none     Admission status:  ED Disposition     ED Disposition  Admit   Condition  --   Comment  Hospital Area: [facility] [[id number]]  Level of Care: Telemetry [5]  Admit to tele based on following criteria: Other see comments  Comments: chf?  May admit patient to [facility] or [facility] if equivalent level of care is available:: No  Covid Evaluation: Asymptomatic - no recent exposure (last 10 days) testing not required  Diagnosis: Acute respiratory failure with hypoxia [facility]) [[id number]]  Admitting Physician: Therisa Doyne [[id number]]  Attending  Physician: Therisa Doyne [[id number]]  Certification:: I certify this patient will need inpatient services for at least 2 midnights  Expected Medical Readiness: 08/17/2023            inpatient     I Expect 2 midnight stay secondary to severity of patient's current illness need for inpatient interventions justified by the following:  hemodynamic instability despite optimal treatment ( hypoxia, )  Severe lab/radiological/exam abnormalities including:   Abnormal CT scan and extensive comorbidities including:  CHF  CAD    That are currently affecting medical management.   I expect  patient to be hospitalized for 2 midnights requiring inpatient medical care.  Patient is at high risk for adverse outcome (such as loss of life or disability) if not treated.  Indication for inpatient stay as follows:    New or worsening hypoxia   Need for IV antibiotics,    Level of care     tele  For  24H       Jazmin Ley 08/14/2023, 10:52 PM    [facility]     after 2 AM please page floor coverage PA If 7AM-7PM, please contact the day team taking care of the patient using [email or web address]

## 2023-08-14 NOTE — Assessment & Plan Note (Signed)
 Given elevated LFTs we will hold Lipitor

## 2023-08-14 NOTE — Assessment & Plan Note (Signed)
 Continue Norvasc 5 mg a day hold losartan hydrochlorothiazide given slightly hyponatremia continue Lopressor 25 mg p.o. twice daily

## 2023-08-14 NOTE — Assessment & Plan Note (Signed)
 Check TSH continue Synthroid at 50 mcg a day

## 2023-08-14 NOTE — ED Notes (Signed)
 Upon standing and starting to ambulate, patient's pulse ox immediately drops to 85% on RA.

## 2023-08-14 NOTE — Subjective & Objective (Signed)
 Presents with worsening shortness of breath that has been ongoing for the past 6 weeks she was seen by cardiology last time in March 14 but her symptoms continued to progress she also endorsed occasional coughing no fevers  Patient felt some discomfort in her left thigh became concerned and wanted to get evaluated for DVT as she had them in the past presented to ER

## 2023-08-14 NOTE — ED Provider Notes (Signed)
 [place] EMERGENCY DEPARTMENT AT [facility] Provider Note   CSN: [id number] Arrival date & time: 08/14/23  1152     History {Add pertinent medical, surgical, social history, OB history to HPI:1} No chief complaint on file.   Amber [name] is a 83 y.o. female.  HPI   Patient has a peripheral vascular disease left bundle branch block acid reflux, hypertension obesity, sepsis, cellulitis, acute kidney injury, thrombocytopenia, NSTEMI.  Patient was recently seen at the cardiologist office on March 14.  Patient mentioned increasing shortness of breath over the previous 6 weeks.  Patient had laboratory testing ordered.  She already had had an outpatient chest x-ray.  Plan was for follow-up.  Patient states that she has noted that her symptoms continue to increase.  Today she was getting short of breath just trying to get dressed.  She has had some occasional coughing no fever.  She has had some discomfort in her left thigh region.  Patient states she has a history of DVTs in the past but it was a long time ago.  Home Medications Prior to Admission medications   Medication Sig Start Date End Date Taking? Authorizing Provider  acetaminophen (TYLENOL) 500 MG tablet Take 1,000 mg by mouth 3 (three) times daily.    [provider]  albuterol (VENTOLIN HFA) 108 (90 Base) MCG/ACT inhaler Inhale 2 puffs into the lungs every 4 (four) hours as needed. 08/07/23   [provider]  amLODipine (NORVASC) 5 MG tablet Take 5 mg by mouth daily.    [provider]  aspirin EC 81 MG tablet Take 1 tablet (81 mg total) by mouth daily. Swallow whole. 04/17/22   Marguerita Merles Latif, DO  atorvastatin (LIPITOR) 20 MG tablet Take 20 mg by mouth at bedtime.    [provider]  B Complex Vitamins (VITAMIN-B COMPLEX) TABS Take 1 tablet by mouth daily.     [provider]  calcium carbonate (TUMS EX) 750 MG chewable tablet Chew 1 tablet by mouth every 4 (four) hours  as needed for heartburn.    [provider]  Cholecalciferol (VITAMIN D3) 1000 units CAPS Take 1,000 Units by mouth daily.     [provider]  clobetasol ointment (TEMOVATE) 0.05 % Apply 1 Application topically every evening.    [provider]  Cranberry-Vitamin C-Inulin (UTI-STAT) LIQD Take 30 mLs by mouth daily in the afternoon.    [provider]  Cranberry-Vitamin C-Probiotic (AZO CRANBERRY PO) Take 1 tablet by mouth daily.    [provider]  cyclobenzaprine (FLEXERIL) 10 MG tablet Take 1 tablet (10 mg total) by mouth 3 (three) times daily as needed for muscle spasms. 11/11/20   Willow Ora, MD  diclofenac Sodium (VOLTAREN) 1 % GEL Apply 2 g topically 3 (three) times daily as needed (joint pain). 11/14/22   [provider]  fluconazole (DIFLUCAN) 150 MG tablet Take 1 pill at the first sign of a yeast infection, may repeat in 48 hours if needed 09/22/22   [provider]  gabapentin (NEURONTIN) 100 MG capsule TAKE 1 CAPSULE(100 MG) BY MOUTH THREE TIMES DAILY 02/28/21   Willow Ora, MD  levothyroxine (SYNTHROID) 50 MCG tablet Take 1 tablet (50 mcg total) by mouth daily before breakfast. 11/03/21   Willow Ora, MD  losartan-hydrochlorothiazide (HYZAAR) 100-12.5 MG tablet Take 1 tablet by mouth daily. 03/06/23   [provider]  metoprolol tartrate (LOPRESSOR) 25 MG tablet Take 1 tablet (25 mg total) by  mouth 2 (two) times daily. 04/16/22   Sheikh, Omair Latif, DO  MUCUS RELIEF 600 MG 12 hr tablet Take 600 mg by mouth 2 (two) times daily. 08/07/23   [provider]  Multiple Vitamins-Minerals (PRESERVISION AREDS PO) Take 1 tablet by mouth 2 (two) times daily.    [provider]  nitrofurantoin (MACRODANTIN) 100 MG capsule Take 100 mg by mouth daily.    [provider]  nitroGLYCERIN (NITROSTAT) 0.4 MG SL tablet Place 1 tablet (0.4 mg total) under the tongue every 5 (five) minutes as needed for  chest pain. Patient not taking: Reported on 08/10/2023 04/16/22   Marguerita Merles Latif, DO  nystatin (MYCOSTATIN/NYSTOP) powder Apply 1 Application topically daily as needed (for rash/redness).    [provider]  ondansetron (ZOFRAN) 4 MG tablet Take 1 tablet (4 mg total) by mouth every 6 (six) hours as needed for nausea. 04/16/22   Marguerita Merles Latif, DO  oxybutynin (DITROPAN-XL) 5 MG 24 hr tablet Take 5 mg by mouth at bedtime. 10/26/22   [provider]  oxyCODONE (OXY IR/ROXICODONE) 5 MG immediate release tablet Take 1 tablet (5 mg total) by mouth every 6 (six) hours as needed for severe pain. Patient taking differently: Take 5 mg by mouth in the morning, at noon, in the evening, and at bedtime. 04/16/22   Sheikh, Omair Latif, DO  pantoprazole (PROTONIX) 20 MG tablet Take 20 mg by mouth daily. 02/07/23   [provider]  polyethylene glycol (MIRALAX / GLYCOLAX) 17 g packet Take 17 g by mouth daily. Patient taking differently: Take 17 g by mouth 2 (two) times daily. 04/16/22   Marguerita Merles Latif, DO  polyvinyl alcohol (LIQUIFILM TEARS) 1.4 % ophthalmic solution Place 1 drop into both eyes daily as needed for dry eyes.    [provider]  potassium chloride SA (KLOR-CON M) 20 MEQ tablet Take 20 mEq by mouth daily. 03/20/23   [provider]  REFRESH 1.4-0.6 % SOLN Place 1 drop into both eyes in the morning and at bedtime. 01/08/23   [provider]  REFRESH TEARS 0.5 % SOLN Apply to eye. 05/11/23   [provider]  senna-docusate (SENOKOT-S) 8.6-50 MG tablet Take 1 tablet by mouth at bedtime.    [provider]  traMADol (ULTRAM) 50 MG tablet Take 50 mg by mouth 2 (two) times daily as needed. 05/15/23   [provider]      Allergies    Sulfa antibiotics, Codeine, Darvocet [propoxyphene n-acetaminophen], Nickel, Other, and Vicodin [hydrocodone-acetaminophen]    Review of Systems   Review of Systems  Physical  Exam Updated Vital Signs LMP  (LMP Unknown)  Physical Exam Vitals and nursing note reviewed.  Constitutional:      General: She is not in acute distress.    Appearance: She is well-developed.  HENT:     Head: Normocephalic and atraumatic.     Right Ear: External ear normal.     Left Ear: External ear normal.  Eyes:     General: No scleral icterus.       Right eye: No discharge.        Left eye: No discharge.     Conjunctiva/sclera: Conjunctivae normal.  Neck:     Trachea: No tracheal deviation.  Cardiovascular:     Rate and Rhythm: Normal rate and regular rhythm.  Pulmonary:     Effort: Pulmonary effort is normal. No respiratory distress.     Breath sounds: Normal breath sounds. No stridor.  No wheezing or rales.  Abdominal:     General: Bowel sounds are normal. There is no distension.     Palpations: Abdomen is soft.     Tenderness: There is no abdominal tenderness. There is no guarding or rebound.  Musculoskeletal:        General: No tenderness or deformity.     Cervical back: Neck supple.  Skin:    General: Skin is warm and dry.     Findings: No rash.  Neurological:     General: No focal deficit present.     Mental Status: She is alert.     Cranial Nerves: No cranial nerve deficit, dysarthria or facial asymmetry.     Sensory: No sensory deficit.     Motor: No abnormal muscle tone or seizure activity.     Coordination: Coordination normal.  Psychiatric:        Mood and Affect: Mood normal.     ED Results / Procedures / Treatments   Labs (all labs ordered are listed, but only abnormal results are displayed) Labs Reviewed - No data to display  EKG None  Radiology No results found.  Procedures Procedures  {Document cardiac monitor, telemetry assessment procedure when appropriate:1}  Medications Ordered in ED Medications - No data to display  ED Course/ Medical Decision Making/ A&P Clinical Course as of 08/14/23 1210  Tue Aug 14, 2023  1159 Outpatient  labs reviewed from March 14.  Patient had D-dimer of 12.5.  BMP was normal.  CBC shows elevated white blood cell count [JK]    Clinical Course User Index [JK] Linwood Dibbles, MD   {   Click here for ABCD2, HEART and other calculatorsREFRESH Note before signing :1}                              Medical Decision Making Patient with increasing shortness of breath.  Already had some outpatient laboratory test a few days ago.  BNP was normal arguing against CHF.  Patient did have notable elevation in D-dimer.  Pneumonia, pulmonary embolism is a concern.  Will proceed with CT angio imaging laboratory tests.  Currently oxygen saturations normal at rest  Amount and/or Complexity of Data Reviewed Labs: ordered. Radiology: ordered.   ***  {Document critical care time when appropriate:1} {Document review of labs and clinical decision tools ie heart score, Chads2Vasc2 etc:1}  {Document your independent review of radiology images, and any outside records:1} {Document your discussion with family members, caretakers, and with consultants:1} {Document social determinants of health affecting pt's care:1} {Document your decision making why or why not admission, treatments were needed:1} Final Clinical Impression(s) / ED Diagnoses Final diagnoses:  None    Rx / DC Orders ED Discharge Orders     None

## 2023-08-14 NOTE — Progress Notes (Signed)
 Left lower extremity venous duplex has been completed. Preliminary results can be found in CV Proc through chart review.  Results were given to Dr. Lynelle Doctor.   08/14/23 12:45 PM Olen Cordial RVT

## 2023-08-14 NOTE — Assessment & Plan Note (Signed)
 Chronic continue to monitor.

## 2023-08-14 NOTE — Assessment & Plan Note (Signed)
 Continue Lipitor 40 mg a day   Metoprolol 25 mg twice daily

## 2023-08-15 ENCOUNTER — Inpatient Hospital Stay (HOSPITAL_COMMUNITY)

## 2023-08-15 DIAGNOSIS — I5022 Chronic systolic (congestive) heart failure: Secondary | ICD-10-CM | POA: Diagnosis present

## 2023-08-15 DIAGNOSIS — J9601 Acute respiratory failure with hypoxia: Secondary | ICD-10-CM | POA: Diagnosis not present

## 2023-08-15 DIAGNOSIS — R0609 Other forms of dyspnea: Secondary | ICD-10-CM | POA: Diagnosis not present

## 2023-08-15 LAB — CBC
HCT: 38.2 % (ref 36.0–46.0)
Hemoglobin: 13 g/dL (ref 12.0–15.0)
MCH: 31.8 pg (ref 26.0–34.0)
MCHC: 34 g/dL (ref 30.0–36.0)
MCV: 93.4 fL (ref 80.0–100.0)
Platelets: 171 10*3/uL (ref 150–400)
RBC: 4.09 MIL/uL (ref 3.87–5.11)
RDW: 15.3 % (ref 11.5–15.5)
WBC: 9.8 10*3/uL (ref 4.0–10.5)
nRBC: 0 % (ref 0.0–0.2)

## 2023-08-15 LAB — ECHOCARDIOGRAM COMPLETE
AR max vel: 2.08 cm2
AV Area VTI: 2.07 cm2
AV Area mean vel: 2.15 cm2
AV Mean grad: 5 mmHg
AV Peak grad: 8.6 mmHg
Ao pk vel: 1.47 m/s
Area-P 1/2: 3.77 cm2
Calc EF: 60.2 %
Height: 60 in
MV VTI: 1.64 cm2
S' Lateral: 3.6 cm
Single Plane A2C EF: 62.5 %
Single Plane A4C EF: 62.3 %
Weight: 2624 [oz_av]

## 2023-08-15 LAB — RESP PANEL BY RT-PCR (RSV, FLU A&B, COVID)  RVPGX2
Influenza A by PCR: NEGATIVE
Influenza B by PCR: NEGATIVE
Resp Syncytial Virus by PCR: NEGATIVE
SARS Coronavirus 2 by RT PCR: NEGATIVE

## 2023-08-15 LAB — OSMOLALITY: Osmolality: 294 mosm/kg (ref 275–295)

## 2023-08-15 LAB — COMPREHENSIVE METABOLIC PANEL
ALT: 99 U/L — ABNORMAL HIGH (ref 0–44)
AST: 134 U/L — ABNORMAL HIGH (ref 15–41)
Albumin: 3.4 g/dL — ABNORMAL LOW (ref 3.5–5.0)
Alkaline Phosphatase: 128 U/L — ABNORMAL HIGH (ref 38–126)
Anion gap: 10 (ref 5–15)
BUN: 12 mg/dL (ref 8–23)
CO2: 25 mmol/L (ref 22–32)
Calcium: 9.3 mg/dL (ref 8.9–10.3)
Chloride: 99 mmol/L (ref 98–111)
Creatinine, Ser: 0.71 mg/dL (ref 0.44–1.00)
GFR, Estimated: >60 mL/min (ref >60)
Glucose, Bld: 94 mg/dL (ref 70–99)
Potassium: 3.8 mmol/L (ref 3.5–5.1)
Sodium: 134 mmol/L — ABNORMAL LOW (ref 135–145)
Total Bilirubin: 1.4 mg/dL — ABNORMAL HIGH (ref 0.0–1.2)
Total Protein: 7.5 g/dL (ref 6.5–8.1)

## 2023-08-15 LAB — PREALBUMIN: Prealbumin: 18 mg/dL (ref 18–38)

## 2023-08-15 LAB — OSMOLALITY, URINE: Osmolality, Ur: 361 mosm/kg (ref 300–900)

## 2023-08-15 LAB — MAGNESIUM: Magnesium: 2.2 mg/dL (ref 1.7–2.4)

## 2023-08-15 LAB — PHOSPHORUS: Phosphorus: 4.4 mg/dL (ref 2.5–4.6)

## 2023-08-15 MED ORDER — ATORVASTATIN CALCIUM 10 MG PO TABS: 20.0000 mg | ORAL_TABLET | Freq: Every day | ORAL | Status: DC

## 2023-08-15 MED ORDER — ORAL CARE MOUTH RINSE: 15.0000 mL | OROMUCOSAL | Status: DC | PRN

## 2023-08-15 MED ORDER — METOPROLOL TARTRATE 25 MG PO TABS
25.0000 mg | ORAL_TABLET | Freq: Two times a day (BID) | ORAL | Status: DC
  Administered 2023-08-15 – 2023-08-18 (×6): 25 mg via ORAL
  Filled 2023-08-15 (×6): qty 1

## 2023-08-15 MED ORDER — NITROFURANTOIN MONOHYD MACRO 100 MG PO CAPS
100.0000 mg | ORAL_CAPSULE | Freq: Every day | ORAL | Status: DC
  Administered 2023-08-15 – 2023-08-17 (×3): 100 mg via ORAL
  Filled 2023-08-15 (×4): qty 1

## 2023-08-15 MED ORDER — ENSURE MAX PROTEIN PO LIQD
11.0000 [oz_av] | Freq: Every day | ORAL | Status: DC
  Administered 2023-08-16: 11 [oz_av] via ORAL
  Filled 2023-08-15 (×3): qty 330

## 2023-08-15 MED ORDER — AMLODIPINE BESYLATE 5 MG PO TABS
5.0000 mg | ORAL_TABLET | Freq: Every day | ORAL | Status: DC
Start: 2023-08-15 — End: 2023-08-18
  Administered 2023-08-15 – 2023-08-18 (×4): 5 mg via ORAL
  Filled 2023-08-15 (×4): qty 1

## 2023-08-15 MED ORDER — PANTOPRAZOLE SODIUM 20 MG PO TBEC
20.0000 mg | DELAYED_RELEASE_TABLET | Freq: Every day | ORAL | Status: DC
  Administered 2023-08-15 – 2023-08-17 (×3): 20 mg via ORAL
  Filled 2023-08-15 (×3): qty 1

## 2023-08-15 MED ORDER — FUROSEMIDE 10 MG/ML IJ SOLN
40.0000 mg | Freq: Once | INTRAMUSCULAR | Status: AC
  Administered 2023-08-15: 40 mg via INTRAVENOUS
  Filled 2023-08-15: qty 4

## 2023-08-15 MED ORDER — ONDANSETRON HCL 4 MG/2ML IJ SOLN: 4.0000 mg | Freq: Four times a day (QID) | INTRAMUSCULAR | Status: DC | PRN

## 2023-08-15 MED ORDER — GABAPENTIN 100 MG PO CAPS
100.0000 mg | ORAL_CAPSULE | Freq: Three times a day (TID) | ORAL | Status: DC
  Administered 2023-08-15 – 2023-08-18 (×11): 100 mg via ORAL
  Filled 2023-08-15 (×11): qty 1

## 2023-08-15 MED ORDER — PERFLUTREN LIPID MICROSPHERE
1.0000 mL | INTRAVENOUS | Status: AC | PRN
  Administered 2023-08-15: 3 mL via INTRAVENOUS

## 2023-08-15 MED ORDER — ASPIRIN 81 MG PO TBEC
81.0000 mg | DELAYED_RELEASE_TABLET | Freq: Every day | ORAL | Status: DC
  Administered 2023-08-15 – 2023-08-18 (×4): 81 mg via ORAL
  Filled 2023-08-15 (×4): qty 1

## 2023-08-15 MED ORDER — ALBUTEROL SULFATE (2.5 MG/3ML) 0.083% IN NEBU: 3.0000 mL | INHALATION_SOLUTION | RESPIRATORY_TRACT | Status: DC | PRN

## 2023-08-15 MED ORDER — SODIUM CHLORIDE 0.9 % IV SOLN: 250.0000 mL | INTRAVENOUS | Status: AC | PRN

## 2023-08-15 MED ORDER — ACETAMINOPHEN 325 MG PO TABS
650.0000 mg | ORAL_TABLET | Freq: Four times a day (QID) | ORAL | Status: DC | PRN
  Administered 2023-08-15 – 2023-08-18 (×9): 650 mg via ORAL
  Filled 2023-08-15 (×9): qty 2

## 2023-08-15 MED ORDER — SODIUM CHLORIDE 0.9% FLUSH
3.0000 mL | Freq: Two times a day (BID) | INTRAVENOUS | Status: DC
  Administered 2023-08-15 – 2023-08-18 (×8): 3 mL via INTRAVENOUS

## 2023-08-15 MED ORDER — OXYBUTYNIN CHLORIDE ER 5 MG PO TB24
5.0000 mg | ORAL_TABLET | Freq: Every day | ORAL | Status: DC
  Administered 2023-08-15 – 2023-08-17 (×3): 5 mg via ORAL
  Filled 2023-08-15 (×4): qty 1

## 2023-08-15 MED ORDER — SODIUM CHLORIDE 0.9% FLUSH
3.0000 mL | INTRAVENOUS | Status: DC | PRN
  Administered 2023-08-15: 3 mL via INTRAVENOUS

## 2023-08-15 MED ORDER — LEVOTHYROXINE SODIUM 50 MCG PO TABS
50.0000 ug | ORAL_TABLET | Freq: Every day | ORAL | Status: DC
  Administered 2023-08-15 – 2023-08-18 (×4): 50 ug via ORAL
  Filled 2023-08-15 (×4): qty 1

## 2023-08-15 MED ORDER — ONDANSETRON HCL 4 MG PO TABS: 4.0000 mg | ORAL_TABLET | Freq: Four times a day (QID) | ORAL | Status: DC | PRN

## 2023-08-15 NOTE — Progress Notes (Signed)
 Family member unhooked the pulse oximetry and took the machine out of patient's room and placed it on the floor in front of patient's room. Charge RN was alert by staff and came to patient's room. Daughter stated that she did not see any charger or anything hooked together. Charge RN assessed patient and the pulse oximetry machine, the charger was unplug and hid under the recliner. Patient and family was educated to call RN or NT by call light for assistance if anything happened related to patient 's needs or medical devices's function, and should not disconnect any medical device from patient.

## 2023-08-15 NOTE — Assessment & Plan Note (Signed)
-   will replace electrolytes and repeat  check Mg, phos and Ca level and replace as needed Monitor on telemetry   Lab Results  Component Value Date   K 4.0 08/14/2023     Lab Results  Component Value Date   CREATININE 0.66 08/14/2023   Lab Results  Component Value Date   MG 1.6 (L) 08/14/2023   Lab Results  Component Value Date   CALCIUM 9.2 08/14/2023   PHOS 4.0 08/14/2023

## 2023-08-15 NOTE — Progress Notes (Signed)
 PROGRESS NOTE    Amber [name]  ZOX:[id number] DOB: Apr 11, 1941 DOA: 08/14/2023 PCP: [facility]  Chief Complaint  Patient presents with   Shortness of Breath    Brief Narrative:   83 yo with hx CAD, heart failure, PAD and multiple other medical issues here with progressive SOB with exertion.   Assessment & Plan:   Principal Problem:   Acute respiratory failure with hypoxia (HCC) Active Problems:   Essential hypertension   Acquired hypothyroidism   Mixed hyperlipidemia   Coronary artery disease   Abnormal LFTs   Lumbar radiculopathy   Hypomagnesemia   Chronic systolic CHF (congestive heart failure) (HCC)  Dyspnea on Exertion CT chest with diffuse bilateral interlobular septal thickening and patchy ground glass opacities (edema vs infection) Trial lasix Will follow echo Hold abx for now, lower suspicion for this with subacute presentation over period of 4-5 weeks  Elevated LFT's Steatosis on [place] Holding statin for now  Weight Loss 20 lbs over past year per her report Needs additional w/u outpatient   Hypothyroidism Synthroid  Hypertension Amlodipine, Metoprolol Hydrochlorothiazide on hold with diuresis with loop diuretic above.  Losartan on hold.  Chronic Pain  Lumbar Radiculopathy Oxycodone prn, tylenol prn  Gabapentin  Overactive Bladder Oxybutynin  GERD PPI  CAD Aspirin Metoprolol Holding statin      DVT prophylaxis: SCD Code Status: DNR Family Communication: none at bedside Disposition:   Status is: Inpatient Remains inpatient appropriate because: need for continued inpatient w/u of DOE   Consultants:  none  Procedures:  LE [place] Summary:  RIGHT:  - No evidence of common femoral vein obstruction.         LEFT:      - There is no evidence of deep vein thrombosis in the lower extremity.    - No cystic structure found in the popliteal fossa.      Antimicrobials:  Anti-infectives (From admission, onward)     None       Subjective: Tired C/o DOE x4-5 weeks  Objective: Vitals:   08/15/23 0112 08/15/23 0425 08/15/23 0905 08/15/23 1151  BP: (!) 178/81 (!) 150/69 (!) 129/53 (!) 131/55  Pulse: (!) 104 88 95 90  Resp: 19 19 20    Temp: 97.6 F (36.4 C) 98.2 F (36.8 C) 98.3 F (36.8 C) 98.4 F (36.9 C)  TempSrc: Oral Oral    SpO2: 96% 98% 98% 94%  Weight:      Height:        Intake/Output Summary (Last 24 hours) at 08/15/2023 1353 Last data filed at 08/15/2023 0937 Gross per 24 hour  Intake 118 ml  Output --  Net 118 ml   Filed Weights   08/14/23 1221  Weight: 74.4 kg    Examination:  General exam: Appears calm and comfortable  Respiratory system: Clear to auscultation. Respiratory effort normal. Cardiovascular system: RRR Gastrointestinal system: Abdomen is nondistended, soft and nontender.  Central nervous system: Alert and oriented. No focal neurological deficits. Extremities: trace LE edema   Data Reviewed: I have personally reviewed following labs and imaging studies  CBC: Recent Labs  Lab 08/10/23 0918 08/14/23 1218 08/15/23 0606  WBC 16.9* 10.7* 9.8  NEUTROABS 12.9*  --   --   HGB 12.2 12.0 13.0  HCT 35.8 35.6* 38.2  MCV 95 94.4 93.4  PLT 186 155 171    Basic Metabolic Panel: Recent Labs  Lab 08/10/23 0918 08/14/23 1218 08/14/23 2003 08/15/23 0606  NA 135 132*  --  134*  K 4.1 4.0  --  3.8  CL 100 99  --  99  CO2 19* 24  --  25  GLUCOSE 93 92  --  94  BUN 12 12  --  12  CREATININE 0.79 0.66  --  0.71  CALCIUM 9.7 9.2  --  9.3  MG  --   --  1.6* 2.2  PHOS  --   --  4.0 4.4    GFR: Estimated Creatinine Clearance: 46.3 mL/min (by C-G formula based on SCr of 0.71 mg/dL).  Liver Function Tests: Recent Labs  Lab 08/14/23 1218 08/15/23 0606  AST 129* 134*  ALT 92* 99*  ALKPHOS 119 128*  BILITOT 1.5* 1.4*  PROT 7.2 7.5  ALBUMIN 3.4* 3.4*    CBG: No results for input(s): "GLUCAP" in the last 168 hours.   Recent Results (from  the past 240 hours)  Resp panel by RT-PCR (RSV, Flu Bryse Blanchette&B, Covid) Urine, Clean Catch     Status: None   Collection Time: 08/14/23 11:06 PM   Specimen: Urine, Clean Catch; Nasal Swab  Result Value Ref Range Status   SARS Coronavirus 2 by RT PCR NEGATIVE NEGATIVE Final    Comment: (NOTE) SARS-CoV-2 target nucleic acids are NOT DETECTED.  The SARS-CoV-2 RNA is generally detectable in upper respiratory specimens during the acute phase of infection. The lowest concentration of SARS-CoV-2 viral copies this assay can detect is 138 copies/mL. Joani Cosma negative result does not preclude SARS-Cov-2 infection and should not be used as the sole basis for treatment or other patient management decisions. Wood Novacek negative result may occur with  improper specimen collection/handling, submission of specimen other than nasopharyngeal swab, presence of viral mutation(s) within the areas targeted by this assay, and inadequate number of viral copies(<138 copies/mL). Shaili Donalson negative result must be combined with clinical observations, patient history, and epidemiological information. The expected result is Negative.  Fact Sheet for Patients:  BloggerCourse.com  Fact Sheet for Healthcare Providers:  SeriousBroker.it  This test is no t yet approved or cleared by the [place] FDA and  has been authorized for detection and/or diagnosis of SARS-CoV-2 by FDA under an Emergency Use Authorization (EUA). This EUA will remain  in effect (meaning this test can be used) for the duration of the COVID-19 declaration under Section 564(b)(1) of the Act, 21 U.S.C.section 360bbb-3(b)(1), unless the authorization is terminated  or revoked sooner.       Influenza Jarome Trull by PCR NEGATIVE NEGATIVE Final   Influenza B by PCR NEGATIVE NEGATIVE Final    Comment: (NOTE) The Xpert Xpress SARS-CoV-2/FLU/RSV plus assay is intended as an aid in the diagnosis of influenza from Nasopharyngeal swab  specimens and should not be used as Domingue Coltrain sole basis for treatment. Nasal washings and aspirates are unacceptable for Xpert Xpress SARS-CoV-2/FLU/RSV testing.  Fact Sheet for Patients: BloggerCourse.com  Fact Sheet for Healthcare Providers: SeriousBroker.it  This test is not yet approved or cleared by the [place] FDA and has been authorized for detection and/or diagnosis of SARS-CoV-2 by FDA under an Emergency Use Authorization (EUA). This EUA will remain in effect (meaning this test can be used) for the duration of the COVID-19 declaration under Section 564(b)(1) of the Act, 21 U.S.C. section 360bbb-3(b)(1), unless the authorization is terminated or revoked.     Resp Syncytial Virus by PCR NEGATIVE NEGATIVE Final    Comment: (NOTE) Fact Sheet for Patients: BloggerCourse.com  Fact Sheet for Healthcare Providers: SeriousBroker.it  This test is not yet approved or cleared  by the [place] and has been authorized for detection and/or diagnosis of SARS-CoV-2 by FDA under an Emergency Use Authorization (EUA). This EUA will remain in effect (meaning this test can be used) for the duration of the COVID-19 declaration under Section 564(b)(1) of the Act, 21 U.S.C. section 360bbb-3(b)(1), unless the authorization is terminated or revoked.  Performed at [facility], [address], [place], [place] [address]          Radiology Studies: US Abdomen Limited RUQ (LIVER/GB) Result Date: 08/15/2023 CLINICAL DATA:  Elevated LFTs EXAM: ULTRASOUND ABDOMEN LIMITED RIGHT UPPER QUADRANT COMPARISON:  CT 03/01/2023 FINDINGS: Gallbladder: No gallstones or wall thickening visualized. No sonographic Murphy sign noted by sonographer. Common bile duct: Diameter: Normal caliber, 2 mm Liver: Increased echotexture compatible with fatty infiltration. No focal abnormality or  biliary ductal dilatation. Portal vein is patent on color Doppler imaging with normal direction of blood flow towards the liver. Other: None. IMPRESSION: Hepatic steatosis. No acute findings. Electronically Signed   By: Charlett Nose M.D.   On: 08/15/2023 00:24   CT Angio Chest PE W and/or Wo Contrast Result Date: 08/14/2023 CLINICAL DATA:  Shortness of breath. Positive D-dimer. PE suspected EXAM: CT ANGIOGRAPHY CHEST WITH CONTRAST TECHNIQUE: Multidetector CT imaging of the chest was performed using the standard protocol during bolus administration of intravenous contrast. Multiplanar CT image reconstructions and MIPs were obtained to evaluate the vascular anatomy. RADIATION DOSE REDUCTION: This exam was performed according to the departmental dose-optimization program which includes automated exposure control, adjustment of the mA and/or kV according to patient size and/or use of iterative reconstruction technique. CONTRAST:  75mL OMNIPAQUE IOHEXOL 350 MG/ML SOLN COMPARISON:  Radiograph 08/14/2023 and CTA chest 01/18/2023 FINDINGS: Cardiovascular: No pericardial effusion. Normal caliber aorta without dissection. Aortic and coronary artery atherosclerotic calcification. Negative for acute pulmonary embolism. Mediastinum/Nodes: Small hiatal hernia. Trachea is unremarkable. Subcentimeter mediastinal and hilar lymph nodes are likely reactive. Lungs/Pleura: Diffuse bilateral interlobular septal thickening and patchy ground-glass opacities. No pleural effusion or pneumothorax. Upper Abdomen: No acute abnormality. Musculoskeletal: No acute fracture. Review of the MIP images confirms the above findings. IMPRESSION: 1. Negative for acute pulmonary embolism. 2. Diffuse bilateral interlobular septal thickening and patchy ground-glass opacities may be due to pulmonary edema or atypical infection 3. Aortic Atherosclerosis (ICD10-I70.0). Electronically Signed   By: Minerva Fester M.D.   On: 08/14/2023 19:26   VAS [place] LOWER  EXTREMITY VENOUS (DVT) (7a-7p) Result Date: 08/14/2023  Lower Venous DVT Study Patient Name:  Amber [name]  Date of Exam:   08/14/2023 Medical Rec #: [id number]      Accession #:    [id number] Date of Birth: 11/24/1940      Patient Gender: F Patient Age:   28 years Exam Location:  [facility] Procedure:      VAS [place] LOWER EXTREMITY VENOUS (DVT) Referring Phys: JON KNAPP --------------------------------------------------------------------------------  Indications: Swelling.  Risk Factors: None identified. Comparison Study: No prior studies. Performing Technologist: Chanda Busing RVT  Examination Guidelines: Casandra Dallaire complete evaluation includes B-mode imaging, spectral Doppler, color Doppler, and power Doppler as needed of all accessible portions of each vessel. Bilateral testing is considered an integral part of Molley Houser complete examination. Limited examinations for reoccurring indications may be performed as noted. The reflux portion of the exam is performed with the patient in reverse Trendelenburg.  +-----+---------------+---------+-----------+----------+--------------+ RIGHTCompressibilityPhasicitySpontaneityPropertiesThrombus Aging +-----+---------------+---------+-----------+----------+--------------+ CFV  Full           Yes      Yes                                 +-----+---------------+---------+-----------+----------+--------------+   +---------+---------------+---------+-----------+----------+--------------+  LEFT     CompressibilityPhasicitySpontaneityPropertiesThrombus Aging +---------+---------------+---------+-----------+----------+--------------+ CFV      Full           Yes      Yes                                 +---------+---------------+---------+-----------+----------+--------------+ SFJ      Full                                                        +---------+---------------+---------+-----------+----------+--------------+ FV Prox  Full                                                         +---------+---------------+---------+-----------+----------+--------------+ FV Mid   Full                                                        +---------+---------------+---------+-----------+----------+--------------+ FV DistalFull                                                        +---------+---------------+---------+-----------+----------+--------------+ PFV      Full                                                        +---------+---------------+---------+-----------+----------+--------------+ POP      Full           Yes      Yes                                 +---------+---------------+---------+-----------+----------+--------------+ PTV      Full                                                        +---------+---------------+---------+-----------+----------+--------------+ PERO     Full                                                        +---------+---------------+---------+-----------+----------+--------------+    Summary: RIGHT: - No evidence of common femoral vein obstruction.   LEFT: - There is no evidence of deep vein thrombosis in the lower extremity.  - No cystic structure found in the popliteal fossa.  *See table(s) above for measurements and observations.  Electronically signed by Lemar Livings MD on 08/14/2023 at 3:44:36 PM.    Final    DG Chest Port 1 View Result Date: 08/14/2023 CLINICAL DATA:  Dyspnea. EXAM: PORTABLE CHEST 1 VIEW COMPARISON:  January 18, 2023. FINDINGS: The heart size and mediastinal contours are within normal limits. Both lungs are clear. The visualized skeletal structures are unremarkable. IMPRESSION: No active disease. Electronically Signed   By: Lupita Raider M.D.   On: 08/14/2023 14:56        Scheduled Meds:  amLODipine  5 mg Oral Daily   aspirin EC  81 mg Oral Daily   gabapentin  100 mg Oral TID   levothyroxine  50 mcg Oral Q0600   pantoprazole  20 mg Oral Daily   [START  ON 08/16/2023] Ensure Max Protein  11 oz Oral Daily   sodium chloride flush  3 mL Intravenous Q12H   Continuous Infusions:  sodium chloride       LOS: 1 day    Time spent: over 30 min    Lacretia Nicks, MD [facility]   To contact the attending provider between 7A-7P or the covering provider during after hours 7P-7A, please log into the web site [email or web address] and access using universal [place] password for that web site. If you do not have the password, please call the hospital operator.  08/15/2023, 1:53 PM

## 2023-08-15 NOTE — Plan of Care (Signed)

## 2023-08-15 NOTE — ED Notes (Signed)
 ..ED TO INPATIENT HANDOFF REPORT  Name/Age/Gender Amber [name] 83 y.o. female  Code Status    Code Status Orders  (From admission, onward)           Start     Ordered   08/14/23 2254  Do not attempt resuscitation (DNR)- Limited -Do Not Intubate (DNI)  (Code Status)  Continuous       Question Answer Comment  If pulseless and not breathing No CPR or chest compressions.   In Pre-Arrest Conditions (Patient Is Breathing and Has A Pulse) Do not intubate. Provide all appropriate non-invasive medical interventions. Avoid ICU transfer unless indicated or required.   Consent: Discussion documented in EHR or advanced directives reviewed      08/14/23 2253           Code Status History     Date Active Date Inactive Code Status Order ID Comments User Context   08/14/2023 2230 08/14/2023 2253 Full Code [id number]  Therisa Doyne, MD ED   03/27/2023 1519 03/29/2023 2044 Full Code [id number]  Antionette Char, MD Inpatient   03/27/2023 0848 03/27/2023 1519 Full Code [id number]  Doylene Bode, NP Inpatient   04/08/2022 1447 04/16/2022 2102 Full Code [id number]  Tomma Lightning, MD ED   04/08/2022 0802 04/08/2022 1447 Full Code [id number]  Clydie Braun, MD ED   09/09/2014 1238 09/12/2014 1750 Full Code [id number]  Ranee Gosselin, MD Inpatient   07/05/2011 1455 07/07/2011 1451 Full Code [id number]  Placido Sou, RN Inpatient       Home/SNF/Other Skilled nursing facility  Chief Complaint Acute respiratory failure with hypoxia (HCC) [J96.01]  Level of Care/Admitting Diagnosis ED Disposition     ED Disposition  Admit   Condition  --   Comment  Hospital Area: [facility] [[id number]]  Level of Care: Telemetry [5]  Admit to tele based on following criteria: Other see comments  Comments: chf?  May admit patient to [facility] or [facility] if equivalent level of care is available:: No  Covid Evaluation: Asymptomatic - no recent exposure (last 10 days)  testing not required  Diagnosis: Acute respiratory failure with hypoxia [facility]) [[id number]]  Admitting Physician: Therisa Doyne [[id number]]  Attending Physician: Therisa Doyne [[id number]]  Certification:: I certify this patient will need inpatient services for at least 2 midnights  Expected Medical Readiness: 08/17/2023          Medical History Past Medical History:  Diagnosis Date   Arthritis    Coronary artery disease    DVT (deep venous thrombosis) (HCC)    Family history of blood clots    Hx of blood clots    Hypertension    Hypothyroidism    Left bundle branch block    Macular degeneration    Peripheral vascular disease (HCC)    Pneumonia    Post-phlebitic syndrome 04/18/2013   Rotator cuff rupture, complete 07/05/2011   Varicose veins     Allergies Allergies  Allergen Reactions   Sulfa Antibiotics Rash and Other (See Comments)    "Like a sunburn"   Codeine Nausea And Vomiting   Darvocet [Propoxyphene N-Acetaminophen] Nausea And Vomiting   Nickel Rash   Other Rash    Dermabond   Vicodin [Hydrocodone-Acetaminophen] Other (See Comments)    GI upset    IV Location/Drains/Wounds Patient Lines/Drains/Airways Status     Active Line/Drains/Airways     Name Placement date Placement time Site Days   Peripheral IV 08/14/23 22 G 1" Posterior;Right Hand 08/14/23  2308  Hand  1   Incision - 4 Ports Abdomen 1: Umbilicus;Superior 2: Right;Lateral 3: Left;Lateral;Upper 4: Left;Lateral;Mid 03/27/23  1148  -- 141            Labs/Imaging Results for orders placed or performed during the hospital encounter of 08/14/23 (from the past 48 hours)  Comprehensive metabolic panel     Status: Abnormal   Collection Time: 08/14/23 12:18 PM  Result Value Ref Range   Sodium 132 (L) 135 - 145 mmol/L   Potassium 4.0 3.5 - 5.1 mmol/L   Chloride 99 98 - 111 mmol/L   CO2 24 22 - 32 mmol/L   Glucose, Bld 92 70 - 99 mg/dL    Comment: Glucose reference range applies only to samples  taken after fasting for at least 8 hours.   BUN 12 8 - 23 mg/dL   Creatinine, Ser 2.59 0.44 - 1.00 mg/dL   Calcium 9.2 8.9 - 56.3 mg/dL   Total Protein 7.2 6.5 - 8.1 g/dL   Albumin 3.4 (L) 3.5 - 5.0 g/dL   AST 875 (H) 15 - 41 U/L   ALT 92 (H) 0 - 44 U/L   Alkaline Phosphatase 119 38 - 126 U/L   Total Bilirubin 1.5 (H) 0.0 - 1.2 mg/dL   GFR, Estimated >64 >33 mL/min    Comment: (NOTE) Calculated using the CKD-EPI Creatinine Equation (2021)    Anion gap 9 5 - 15    Comment: Performed at [facility], [address], [place], [place] [address]  CBC     Status: Abnormal   Collection Time: 08/14/23 12:18 PM  Result Value Ref Range   WBC 10.7 (H) 4.0 - 10.5 K/uL   RBC 3.77 (L) 3.87 - 5.11 MIL/uL   Hemoglobin 12.0 12.0 - 15.0 g/dL   HCT 84.1 (L) 66.0 - 63.0 %   MCV 94.4 80.0 - 100.0 fL   MCH 31.8 26.0 - 34.0 pg   MCHC 33.7 30.0 - 36.0 g/dL   RDW 16.0 10.9 - 32.3 %   Platelets 155 150 - 400 K/uL   nRBC 0.0 0.0 - 0.2 %    Comment: Performed at [facility], [address] [place], [place] [address]  Brain natriuretic peptide     Status: None   Collection Time: 08/14/23 12:18 PM  Result Value Ref Range   B Natriuretic Peptide 55.5 0.0 - 100.0 pg/mL    Comment: Performed at [facility], [address], [place], [place] [address]  Troponin I (High Sensitivity)     Status: None   Collection Time: 08/14/23 12:18 PM  Result Value Ref Range   Troponin I (High Sensitivity) 5 <18 ng/L    Comment: (NOTE) Elevated high sensitivity troponin I (hsTnI) values and significant  changes across serial measurements may suggest ACS but many other  chronic and acute conditions are known to elevate hsTnI results.  Refer to the "Links" section for chest pain algorithms and additional  guidance. Performed at [facility], [address], [place], [place] [address]   Troponin I (High Sensitivity)     Status: None    Collection Time: 08/14/23  8:03 PM  Result Value Ref Range   Troponin I (High Sensitivity) 6 <18 ng/L    Comment: (NOTE) Elevated high sensitivity troponin I (hsTnI) values and significant  changes across serial measurements may suggest ACS but many other  chronic and acute conditions are known to elevate hsTnI results.  Refer to  the "Links" section for chest pain algorithms and additional  guidance. Performed at [facility], [address] [place], [place] [address]   Procalcitonin     Status: None   Collection Time: 08/14/23  8:03 PM  Result Value Ref Range   Procalcitonin <0.10 ng/mL    Comment:        Interpretation: PCT (Procalcitonin) <= 0.5 ng/mL: Systemic infection (sepsis) is not likely. Local bacterial infection is possible. (NOTE)       Sepsis PCT Algorithm           Lower Respiratory Tract                                      Infection PCT Algorithm    ----------------------------     ----------------------------         PCT < 0.25 ng/mL                PCT < 0.10 ng/mL          Strongly encourage             Strongly discourage   discontinuation of antibiotics    initiation of antibiotics    ----------------------------     -----------------------------       PCT 0.25 - 0.50 ng/mL            PCT 0.10 - 0.25 ng/mL               OR       >80% decrease in PCT            Discourage initiation of                                            antibiotics      Encourage discontinuation           of antibiotics    ----------------------------     -----------------------------         PCT >= 0.50 ng/mL              PCT 0.26 - 0.50 ng/mL               AND        <80% decrease in PCT             Encourage initiation of                                             antibiotics       Encourage continuation           of antibiotics    ----------------------------     -----------------------------        PCT >= 0.50 ng/mL                  PCT > 0.50 ng/mL                AND         increase in PCT                  Strongly encourage  initiation of antibiotics    Strongly encourage escalation           of antibiotics                                     -----------------------------                                           PCT <= 0.25 ng/mL                                                 OR                                        > 80% decrease in PCT                                      Discontinue / Do not initiate                                             antibiotics  Performed at [facility], [address] [place], [place] [address]   Magnesium     Status: Abnormal   Collection Time: 08/14/23  8:03 PM  Result Value Ref Range   Magnesium 1.6 (L) 1.7 - 2.4 mg/dL    Comment: Performed at [facility], [address], [place], [place] [address]  Phosphorus     Status: None   Collection Time: 08/14/23  8:03 PM  Result Value Ref Range   Phosphorus 4.0 2.5 - 4.6 mg/dL    Comment: Performed at [facility], [address] [place], [place] [address]  Urinalysis, w/ Reflex to Culture (Infection Suspected) -Urine, Clean Catch     Status: Abnormal   Collection Time: 08/14/23  8:26 PM  Result Value Ref Range   Specimen Source URINE, CLEAN CATCH    Color, Urine STRAW (A) YELLOW   APPearance CLEAR CLEAR   Specific Gravity, Urine 1.030 1.005 - 1.030   pH 6.0 5.0 - 8.0   Glucose, UA NEGATIVE NEGATIVE mg/dL   Hgb urine dipstick NEGATIVE NEGATIVE   Bilirubin Urine NEGATIVE NEGATIVE   Ketones, ur NEGATIVE NEGATIVE mg/dL   Protein, ur NEGATIVE NEGATIVE mg/dL   Nitrite NEGATIVE NEGATIVE   Leukocytes,Ua SMALL (A) NEGATIVE   RBC / HPF 0-5 0 - 5 RBC/hpf   WBC, UA 0-5 0 - 5 WBC/hpf    Comment:        Reflex urine culture not performed if WBC <=10, OR if Squamous epithelial cells >5. If Squamous epithelial cells >5 suggest recollection.    Bacteria,  UA NONE SEEN NONE SEEN   Squamous Epithelial / HPF 0-5 0 - 5 /HPF    Comment: Performed at [facility], [address] [place], [place] [address]  Blood gas, venous  Status: Abnormal   Collection Time: 08/14/23 11:06 PM  Result Value Ref Range   pH, Ven 7.47 (H) 7.25 - 7.43   pCO2, Ven 36 (L) 44 - 60 mmHg   pO2, Ven 59 (H) 32 - 45 mmHg   Bicarbonate 26.2 20.0 - 28.0 mmol/L   Acid-Base Excess 2.6 (H) 0.0 - 2.0 mmol/L   O2 Saturation 93.3 %   Patient temperature 37.0    Drawn by [address]     Comment: Performed at [facility], [address] [place], [place] [address]   CT Angio Chest PE W and/or Wo Contrast Result Date: 08/14/2023 CLINICAL DATA:  Shortness of breath. Positive D-dimer. PE suspected EXAM: CT ANGIOGRAPHY CHEST WITH CONTRAST TECHNIQUE: Multidetector CT imaging of the chest was performed using the standard protocol during bolus administration of intravenous contrast. Multiplanar CT image reconstructions and MIPs were obtained to evaluate the vascular anatomy. RADIATION DOSE REDUCTION: This exam was performed according to the departmental dose-optimization program which includes automated exposure control, adjustment of the mA and/or kV according to patient size and/or use of iterative reconstruction technique. CONTRAST:  75mL OMNIPAQUE IOHEXOL 350 MG/ML SOLN COMPARISON:  Radiograph 08/14/2023 and CTA chest 01/18/2023 FINDINGS: Cardiovascular: No pericardial effusion. Normal caliber aorta without dissection. Aortic and coronary artery atherosclerotic calcification. Negative for acute pulmonary embolism. Mediastinum/Nodes: Small hiatal hernia. Trachea is unremarkable. Subcentimeter mediastinal and hilar lymph nodes are likely reactive. Lungs/Pleura: Diffuse bilateral interlobular septal thickening and patchy ground-glass opacities. No pleural effusion or pneumothorax. Upper Abdomen: No acute abnormality. Musculoskeletal: No acute fracture. Review of  the MIP images confirms the above findings. IMPRESSION: 1. Negative for acute pulmonary embolism. 2. Diffuse bilateral interlobular septal thickening and patchy ground-glass opacities may be due to pulmonary edema or atypical infection 3. Aortic Atherosclerosis (ICD10-I70.0). Electronically Signed   By: Minerva Fester M.D.   On: 08/14/2023 19:26   VAS [place] LOWER EXTREMITY VENOUS (DVT) (7a-7p) Result Date: 08/14/2023  Lower Venous DVT Study Patient Name:  LADASIA SIRCY  Date of Exam:   08/14/2023 Medical Rec #: [id number]      Accession #:    [id number] Date of Birth: 06/20/1940      Patient Gender: F Patient Age:   55 years Exam Location:  [facility] Procedure:      VAS [place] LOWER EXTREMITY VENOUS (DVT) Referring Phys: JON KNAPP --------------------------------------------------------------------------------  Indications: Swelling.  Risk Factors: None identified. Comparison Study: No prior studies. Performing Technologist: Chanda Busing RVT  Examination Guidelines: A complete evaluation includes B-mode imaging, spectral Doppler, color Doppler, and power Doppler as needed of all accessible portions of each vessel. Bilateral testing is considered an integral part of a complete examination. Limited examinations for reoccurring indications may be performed as noted. The reflux portion of the exam is performed with the patient in reverse Trendelenburg.  +-----+---------------+---------+-----------+----------+--------------+ RIGHTCompressibilityPhasicitySpontaneityPropertiesThrombus Aging +-----+---------------+---------+-----------+----------+--------------+ CFV  Full           Yes      Yes                                 +-----+---------------+---------+-----------+----------+--------------+   +---------+---------------+---------+-----------+----------+--------------+ LEFT     CompressibilityPhasicitySpontaneityPropertiesThrombus Aging  +---------+---------------+---------+-----------+----------+--------------+ CFV      Full           Yes      Yes                                 +---------+---------------+---------+-----------+----------+--------------+  SFJ      Full                                                        +---------+---------------+---------+-----------+----------+--------------+ FV Prox  Full                                                        +---------+---------------+---------+-----------+----------+--------------+ FV Mid   Full                                                        +---------+---------------+---------+-----------+----------+--------------+ FV DistalFull                                                        +---------+---------------+---------+-----------+----------+--------------+ PFV      Full                                                        +---------+---------------+---------+-----------+----------+--------------+ POP      Full           Yes      Yes                                 +---------+---------------+---------+-----------+----------+--------------+ PTV      Full                                                        +---------+---------------+---------+-----------+----------+--------------+ PERO     Full                                                        +---------+---------------+---------+-----------+----------+--------------+    Summary: RIGHT: - No evidence of common femoral vein obstruction.   LEFT: - There is no evidence of deep vein thrombosis in the lower extremity.  - No cystic structure found in the popliteal fossa.  *See table(s) above for measurements and observations. Electronically signed by Lemar Livings MD on 08/14/2023 at 3:44:36 PM.    Final    DG Chest Port 1 View Result Date: 08/14/2023 CLINICAL DATA:  Dyspnea. EXAM: PORTABLE CHEST 1 VIEW COMPARISON:  January 18, 2023. FINDINGS: The heart size and  mediastinal contours are within normal limits. Both lungs are clear. The visualized skeletal structures are unremarkable. IMPRESSION: No active disease.  Electronically Signed   By: Lupita Raider M.D.   On: 08/14/2023 14:56    Pending Labs Unresulted Labs (From admission, onward)     Start     Ordered   08/15/23 0500  Prealbumin  Tomorrow morning,   R        08/14/23 2151   08/14/23 2230  Osmolality  Once,   AD        08/14/23 2230   08/14/23 2156  Resp panel by RT-PCR (RSV, Flu A&B, Covid) Urine, Clean Catch  (Resp Panel by RT-PCR (RSV, Flu A&B, Covid) with precautions)  Once,   URGENT        08/14/23 2155   08/14/23 2152  Osmolality, urine  Once,   URGENT        08/14/23 2151   Signed and Held  Magnesium  Tomorrow morning,   R        Signed and Held   Signed and Held  Phosphorus  Tomorrow morning,   R        Signed and Held   Signed and Held  Comprehensive metabolic panel  Tomorrow morning,   R       Question:  Release to patient  Answer:  Immediate   Signed and Held   Signed and Held  CBC  Tomorrow morning,   R       Question:  Release to patient  Answer:  Immediate   Signed and Held            Vitals/Pain Today's Vitals   08/14/23 2045 08/14/23 2300 08/14/23 2311 08/14/23 2349  BP: (!) 153/72 (!) 130/59    Pulse: 99 85    Resp: (!) 21 16    Temp:   98.4 F (36.9 C)   TempSrc:   Oral   SpO2: 93% 93%    Weight:      Height:      PainSc:    10-Worst pain ever    Isolation Precautions Airborne and Contact precautions  Medications Medications  oxyCODONE (Oxy IR/ROXICODONE) immediate release tablet 5 mg (5 mg Oral Given 08/14/23 2349)  magnesium sulfate IVPB 2 g 50 mL (2 g Intravenous New Bag/Given 08/14/23 2309)  iohexol (OMNIPAQUE) 350 MG/ML injection 75 mL (75 mLs Intravenous Contrast Given 08/14/23 1726)    Mobility Walks with person assist

## 2023-08-15 NOTE — Assessment & Plan Note (Signed)
 Clinically does not appear to be fluid overloaded BNP is unremarkable repeat echogram will need to continue to follow-up with cardiology avoid fluid overload

## 2023-08-15 NOTE — Progress Notes (Signed)
  Echocardiogram 2D Echocardiogram has been performed.  Amber [name] Amber [name] 08/15/2023, 3:32 PM

## 2023-08-15 NOTE — Assessment & Plan Note (Signed)
 Continue home medications

## 2023-08-15 NOTE — Evaluation (Signed)
 Physical Therapy Evaluation Patient Details Name: Amber [name] MRN: [id number] DOB: 03-Aug-1940 Today's Date: 08/15/2023  History of Present Illness  83 yo female admitted with acute resp failure. Hx of CAD, CHF, PVD, LBBB, R TKA, chronic pain, macular degeneration, DVT, OA, venours insufficiency, anemia  Clinical Impression  On eval, pt was Supv level for mobility. She walked to and from bathroom with a RW. O2 >90% on RA during brief session. Session interrupted by cardiac [place] tech arrival. Assisted pt into bed for procedure. Will plan to follow pt during hospital stay and progress activity as tolerated. Do not anticipate any f/u PT needs at this time.         If plan is discharge home, recommend the following: A little help with walking and/or transfers;A little help with bathing/dressing/bathroom;Assistance with cooking/housework;Assist for transportation;Help with stairs or ramp for entrance   Can travel by private vehicle        Equipment Recommendations None recommended by PT  Recommendations for Other Services       Functional Status Assessment Patient has had a recent decline in their functional status and demonstrates the ability to make significant improvements in function in a reasonable and predictable amount of time.     Precautions / Restrictions Precautions Precautions: Fall Restrictions Weight Bearing Restrictions Per Provider Order: No      Mobility  Bed Mobility               General bed mobility comments: oob in recliner. O2 90% on RA.    Transfers Overall transfer level: Needs assistance Equipment used: Rolling walker (2 wheels) Transfers: Sit to/from Stand Sit to Stand: Supervision           General transfer comment: Increased time.    Ambulation/Gait Ambulation/Gait assistance: Supervision Gait Distance (Feet): 15 Feet (x2) Assistive device: Rolling walker (2 wheels) Gait Pattern/deviations: Step-through pattern       General Gait  Details: Slow gait speed. No LOB with RW use. Unfortunately, [place] tech arrived while pt was in bathroom-unable to ambulate in hallway to further assess O2 with ambulation. Once pt back in bed, O2 94% on RA.  Stairs            Wheelchair Mobility     Tilt Bed    Modified Rankin (Stroke Patients Only)       Balance Overall balance assessment: Needs assistance         Standing balance support: Bilateral upper extremity supported, During functional activity, Reliant on assistive device for balance Standing balance-Leahy Scale: Fair                               Pertinent Vitals/Pain Pain Assessment Pain Assessment: 0-10 Pain Score: 9  Pain Location: back-chronic Pain Descriptors / Indicators: Aching Pain Intervention(s): Limited activity within patient's tolerance, Monitored during session, Repositioned    Home Living Family/patient expects to be discharged to:: Assisted living                 Home Equipment: Agricultural consultant (2 wheels);Shower seat;Grab bars - toilet;Grab bars - tub/shower Additional Comments: [facility]    Prior Function               Mobility Comments: uses walker. walks to dining room ADLs Comments: assists with bathing, meals,meds     Extremity/Trunk Assessment   Upper Extremity Assessment Upper Extremity Assessment: Defer to OT evaluation    Lower Extremity  Assessment Lower Extremity Assessment: Generalized weakness    Cervical / Trunk Assessment Cervical / Trunk Assessment: Normal  Communication   Communication Communication: No apparent difficulties    Cognition Arousal: Alert Behavior During Therapy: WFL for tasks assessed/performed   PT - Cognitive impairments: No apparent impairments                         Following commands: Intact       Cueing Cueing Techniques: Verbal cues     General Comments      Exercises     Assessment/Plan    PT Assessment Patient needs continued PT  services  PT Problem List Decreased strength;Decreased range of motion;Decreased activity tolerance;Decreased balance;Decreased mobility;Decreased knowledge of use of DME       PT Treatment Interventions DME instruction;Gait training;Functional mobility training;Therapeutic activities;Therapeutic exercise;Patient/family education;Balance training    PT Goals (Current goals can be found in the Care Plan section)  Acute Rehab PT Goals Patient Stated Goal: breathe better PT Goal Formulation: With patient Time For Goal Achievement: 08/29/23 Potential to Achieve Goals: Good    Frequency Min 1X/week     Co-evaluation               AM-PAC PT "6 Clicks" Mobility  Outcome Measure Help needed turning from your back to your side while in a flat bed without using bedrails?: A Little Help needed moving from lying on your back to sitting on the side of a flat bed without using bedrails?: A Little Help needed moving to and from a bed to a chair (including a wheelchair)?: A Little Help needed standing up from a chair using your arms (e.g., wheelchair or bedside chair)?: A Little Help needed to walk in hospital room?: A Little Help needed climbing 3-5 steps with a railing? : A Lot 6 Click Score: 17    End of Session   Activity Tolerance: Patient tolerated treatment well Patient left: in bed;with call bell/phone within reach   PT Visit Diagnosis: Difficulty in walking, not elsewhere classified (R26.2)    Time: 3086-5784 PT Time Calculation (min) (ACUTE ONLY): 12 min   Charges:   PT Evaluation $PT Eval Low Complexity: 1 Low   PT General Charges $$ ACUTE PT VISIT: 1 Visit            Faye Ramsay, PT Acute Rehabilitation  Office: [phone]

## 2023-08-15 NOTE — Evaluation (Signed)
 Occupational Therapy Evaluation Patient Details Name: Amber [name] MRN: [id number] DOB: 1941/04/27 Today's Date: 08/15/2023   History of Present Illness   83 yo female admitted with acute resp failure. Hx of CAD, CHF, PVD, LBBB, R TKA, chronic pain, macular degeneration, DVT, OA, venours insufficiency, anemia     Clinical Impressions Pt presents with decline in function and safety with ADLs and ADL mobility with impaired strength, balance and endurance. PTA pt lives at [facility] and was Ind with ADLs with assist with bathing, used rollater for mobility. Pt's O2 SATs dropping to 84% on RA with exertion/ADL mobility with RW, HR up to 121. Pt placed on 2L O2 and recoverd to 94-100%. Pt reports feeling SOB during activity. Pt currently requires CGA -Sup with LB ADLs and mobility with RW. Pt would benefit from acute OT services to address impairments to maximize level of function and safety     If plan is discharge home, recommend the following:   A little help with bathing/dressing/bathroom;A little help with walking and/or transfers;Assist for transportation     Functional Status Assessment   Patient has had a recent decline in their functional status and demonstrates the ability to make significant improvements in function in a reasonable and predictable amount of time.     Equipment Recommendations   None recommended by OT     Recommendations for Other Services         Precautions/Restrictions   Precautions Precautions: Fall Restrictions Weight Bearing Restrictions Per Provider Order: No     Mobility Bed Mobility               General bed mobility comments: oob in recliner. O2 87% on RA.    Transfers Overall transfer level: Needs assistance Equipment used: Rolling walker (2 wheels) Transfers: Sit to/from Stand Sit to Stand: Supervision                  Balance Overall balance assessment: Needs assistance         Standing  balance support: Bilateral upper extremity supported, During functional activity, Reliant on assistive device for balance Standing balance-Leahy Scale: Fair                             ADL either performed or assessed with clinical judgement   ADL Overall ADL's : Needs assistance/impaired Eating/Feeding: Independent;Sitting   Grooming: Wash/dry hands;Wash/dry face;Brushing hair;Supervision/safety   Upper Body Bathing: Supervision/ safety   Lower Body Bathing: Contact guard assist   Upper Body Dressing : Supervision/safety   Lower Body Dressing: Contact guard assist   Toilet Transfer: Supervision/safety;Ambulation;Rolling walker (2 wheels)   Toileting- Clothing Manipulation and Hygiene: Supervision/safety;Sit to/from stand       Functional mobility during ADLs: Supervision/safety;Rolling walker (2 wheels) General ADL Comments: pt's O2 SATs dropping to 84% on RA with exertion/ADL mobility with RW, HR up t 121. Pt placed on 2L O2 and recoverd to 94-100%     Vision Baseline Vision/History: 1 Wears glasses Ability to See in Adequate Light: 0 Adequate Patient Visual Report: No change from baseline       Perception         Praxis         Pertinent Vitals/Pain Pain Assessment Pain Assessment: Faces Faces Pain Scale: Hurts even more Pain Location: back-chronic Pain Descriptors / Indicators: Aching Pain Intervention(s): Limited activity within patient's tolerance, Monitored during session, Repositioned     Extremity/Trunk Assessment Upper  Extremity Assessment Upper Extremity Assessment: Generalized weakness   Lower Extremity Assessment Lower Extremity Assessment: Defer to PT evaluation   Cervical / Trunk Assessment Cervical / Trunk Assessment: Normal   Communication Communication Communication: No apparent difficulties   Cognition Arousal: Alert Behavior During Therapy: [facility] for tasks assessed/performed                                          Cueing  General Comments          Exercises     Shoulder Instructions      Home Living Family/patient expects to be discharged to:: Assisted living                             Home Equipment: Rolling Walker (2 wheels);Shower seat;Grab bars - toilet;Grab bars - tub/shower   Additional Comments: [facility]      Prior Functioning/Environment Prior Level of Function : Needs assist             Mobility Comments: uses walker. walks to dining room ADLs Comments: assists with bathing, meals,meds    OT Problem List: Decreased activity tolerance;Decreased knowledge of use of DME or AE;Decreased strength   OT Treatment/Interventions: Self-care/ADL training;DME and/or AE instruction;Therapeutic activities;Patient/family education;Energy conservation      OT Goals(Current goals can be found in the care plan section)   Acute Rehab OT Goals Patient Stated Goal: get better OT Goal Formulation: With patient/family Time For Goal Achievement: 08/29/23 Potential to Achieve Goals: Good ADL Goals Pt Will Perform Grooming: with set-up;with modified independence;standing Pt Will Perform Lower Body Bathing: with supervision;with set-up;sit to/from stand Pt Will Perform Lower Body Dressing: with supervision;with set-up;sit to/from stand Pt Will Transfer to Toilet: with modified independence;ambulating Pt Will Perform Tub/Shower Transfer: with supervision;ambulating;rolling walker;shower seat;grab bars Additional ADL Goal #1: Pt will verbalize and demo 3 energy conservation techniques for ADLs and ADL mobility   OT Frequency:  Min 1X/week    Co-evaluation              AM-PAC OT "6 Clicks" Daily Activity     Outcome Measure Help from another person eating meals?: None Help from another person taking care of personal grooming?: A Little Help from another person toileting, which includes using toliet, bedpan, or urinal?: A Little Help from  another person bathing (including washing, rinsing, drying)?: A Little Help from another person to put on and taking off regular upper body clothing?: A Little Help from another person to put on and taking off regular lower body clothing?: A Little 6 Click Score: 19   End of Session Equipment Utilized During Treatment: Gait belt;Rolling walker (2 wheels)  Activity Tolerance: Patient tolerated treatment well Patient left: in chair;with call bell/phone within reach;with family/visitor present  OT Visit Diagnosis: Muscle weakness (generalized) (M62.81);Other abnormalities of gait and mobility (R26.89)                Time: 1650-1450 OT Time Calculation (min): 26 min Charges:  OT General Charges $OT Visit: 1 Visit OT Evaluation $OT Eval Low Complexity: 1 Low OT Treatments $Therapeutic Activity: 8-22 mins     Galen Manila 08/15/2023, 5:01 PM

## 2023-08-15 NOTE — Progress Notes (Signed)
 SATURATION QUALIFICATIONS: (This note is used to comply with regulatory documentation for home oxygen)  Patient Saturations on Room Air at Rest = 99%  Patient Saturations on Room Air while Ambulating = 87%  Patient Saturations on 2 Liters of oxygen while Ambulating = 99% While ambulating without oxygen she got dyspneic and heart rate went up to 122.

## 2023-08-15 NOTE — Progress Notes (Signed)
 Initial Nutrition Assessment  DOCUMENTATION CODES:   Obesity unspecified  INTERVENTION:  - Heart diet - Ensure Max po once daily,1 provides 150 kcal and 30 grams of protein.  - Encourage intake as tolerated. - Monitor weight trends.  NUTRITION DIAGNOSIS:   Increased nutrient needs related to acute illness as evidenced by estimated needs.  GOAL:   Patient will meet greater than or equal to 90% of their needs  MONITOR:   PO intake, Supplement acceptance, Weight trends  REASON FOR ASSESSMENT:   Consult Assessment of nutrition requirement/status  ASSESSMENT:   83 y.o. female with PMH significant of nonobstructive CAD, CHF, PVD, HTN, HLD, prediabetes who presented with shortness of breath for the past 6 weeks and admitted for acute respiratory failure.  Patient reports a UBW of 179# and weight loss over the past year due to moving into an assisted living facility and not liking the food in addition to decreased appetite which resulted in eating less.  Per EMR, patient weighed at 175# in April 2024 and has continued to lose to current weight of 164#. This is an 11# or 6% weight loss in 11 months, which is not significant for the time frame.   Patient endorses eating 2 meals a day at her facility. Skips breakfast due to it being early and her sleeping in. She has also noticed early satiety since October when she was admitted for an ovarian cyst which was operated on.  Her current appetite is normal for her. She is documented to have had 100% of breakfast today. She has previously consumed Premier Protein when she lived at home. She is agreeable to try Ensure Max. Would like to receive at 8am daily to count for a breakfast time "meal".    Medications reviewed and include: -  Labs reviewed:  Na 134 No HA1C since 2022   NUTRITION - FOCUSED PHYSICAL EXAM:  MD entered room at end of interview, will attempt at next visit  Diet Order:   Diet Order             Diet Heart Room  service appropriate? Yes; Fluid consistency: Thin  Diet effective now                   EDUCATION NEEDS:  Education needs have been addressed  Skin:  Skin Assessment: Reviewed RN Assessment  Last BM:  3/19  Height:  Ht Readings from Last 1 Encounters:  08/14/23 5' (1.524 m)   Weight:  Wt Readings from Last 1 Encounters:  08/14/23 74.4 kg    BMI:  Body mass index is 32.03 kg/m.  Estimated Nutritional Needs:  Kcal:  1550-1850 kcals Protein:  75-90 grams Fluid:  >/= 1.6L    Shelle Iron RD, LDN Contact via Secure Chat.

## 2023-08-16 DIAGNOSIS — J9601 Acute respiratory failure with hypoxia: Secondary | ICD-10-CM | POA: Diagnosis not present

## 2023-08-16 LAB — CBC WITH DIFFERENTIAL/PLATELET
Abs Immature Granulocytes: 0.03 10*3/uL (ref 0.00–0.07)
Basophils Absolute: 0 10*3/uL (ref 0.0–0.1)
Basophils Relative: 1 %
Eosinophils Absolute: 0.2 10*3/uL (ref 0.0–0.5)
Eosinophils Relative: 2 %
HCT: 34.6 % — ABNORMAL LOW (ref 36.0–46.0)
Hemoglobin: 11.8 g/dL — ABNORMAL LOW (ref 12.0–15.0)
Immature Granulocytes: 0 %
Lymphocytes Relative: 32 %
Lymphs Abs: 2.7 10*3/uL (ref 0.7–4.0)
MCH: 32.2 pg (ref 26.0–34.0)
MCHC: 34.1 g/dL (ref 30.0–36.0)
MCV: 94.5 fL (ref 80.0–100.0)
Monocytes Absolute: 1 10*3/uL (ref 0.1–1.0)
Monocytes Relative: 12 %
Neutro Abs: 4.4 10*3/uL (ref 1.7–7.7)
Neutrophils Relative %: 53 %
Platelets: 152 10*3/uL (ref 150–400)
RBC: 3.66 MIL/uL — ABNORMAL LOW (ref 3.87–5.11)
RDW: 15.2 % (ref 11.5–15.5)
WBC: 8.4 10*3/uL (ref 4.0–10.5)
nRBC: 0 % (ref 0.0–0.2)

## 2023-08-16 LAB — COMPREHENSIVE METABOLIC PANEL
ALT: 71 U/L — ABNORMAL HIGH (ref 0–44)
AST: 78 U/L — ABNORMAL HIGH (ref 15–41)
Albumin: 3.1 g/dL — ABNORMAL LOW (ref 3.5–5.0)
Alkaline Phosphatase: 109 U/L (ref 38–126)
Anion gap: 10 (ref 5–15)
BUN: 17 mg/dL (ref 8–23)
CO2: 25 mmol/L (ref 22–32)
Calcium: 9 mg/dL (ref 8.9–10.3)
Chloride: 97 mmol/L — ABNORMAL LOW (ref 98–111)
Creatinine, Ser: 0.6 mg/dL (ref 0.44–1.00)
GFR, Estimated: >60 mL/min (ref >60)
Glucose, Bld: 96 mg/dL (ref 70–99)
Potassium: 4 mmol/L (ref 3.5–5.1)
Sodium: 132 mmol/L — ABNORMAL LOW (ref 135–145)
Total Bilirubin: 1 mg/dL (ref 0.0–1.2)
Total Protein: 6.8 g/dL (ref 6.5–8.1)

## 2023-08-16 LAB — PHOSPHORUS: Phosphorus: 4.7 mg/dL — ABNORMAL HIGH (ref 2.5–4.6)

## 2023-08-16 LAB — MAGNESIUM: Magnesium: 1.8 mg/dL (ref 1.7–2.4)

## 2023-08-16 MED ORDER — FUROSEMIDE 10 MG/ML IJ SOLN
40.0000 mg | Freq: Once | INTRAMUSCULAR | Status: AC
  Administered 2023-08-16: 40 mg via INTRAVENOUS
  Filled 2023-08-16: qty 4

## 2023-08-16 MED ORDER — POLYVINYL ALCOHOL 1.4 % OP SOLN
1.0000 [drp] | OPHTHALMIC | Status: DC | PRN
  Administered 2023-08-16: 1 [drp] via OPHTHALMIC
  Filled 2023-08-16: qty 15

## 2023-08-16 NOTE — TOC Progression Note (Signed)
 Transition of Care [facility]) - Progression Note    Patient Details  Name: Amber [name] MRN: [id number] Date of Birth: 27-May-1941  [facility] CM/SW Contact  Otelia Santee, LCSW Phone Number: 08/16/2023, 3:30 PM  Clinical Narrative:    Have left 3 VM's for [facility]. Awaiting return call.    Expected Discharge Plan: Assisted Living Barriers to Discharge: Continued Medical Work up  Expected Discharge Plan and Services   Discharge Planning Services: CM Consult   Living arrangements for the past 2 months: Assisted Living Facility                 DME Arranged: N/A DME Agency: NA       HH Arranged: NA HH Agency: NA         Social Determinants of Health (SDOH) Interventions SDOH Screenings   Food Insecurity: No Food Insecurity (08/15/2023)  Housing: Low Risk  (08/15/2023)  Transportation Needs: No Transportation Needs (08/15/2023)  Utilities: Not At Risk (08/15/2023)  Depression (PHQ2-9): Low Risk  (09/06/2022)  Financial Resource Strain: Low Risk  (11/08/2021)  Physical Activity: Inactive (11/08/2021)  Social Connections: Moderately Integrated (08/15/2023)  Stress: No Stress Concern Present (11/08/2021)  Tobacco Use: Medium Risk (08/14/2023)    Readmission Risk Interventions     No data to display

## 2023-08-16 NOTE — Plan of Care (Signed)
  Problem: Clinical Measurements: Goal: Will remain free from infection Outcome: Progressing Goal: Respiratory complications will improve Outcome: Progressing Goal: Cardiovascular complication will be avoided Outcome: Progressing   Problem: Nutrition: Goal: Adequate nutrition will be maintained Outcome: Progressing   Problem: Coping: Goal: Level of anxiety will decrease Outcome: Progressing   Problem: Pain Managment: Goal: General experience of comfort will improve and/or be controlled Outcome: Progressing   Problem: Safety: Goal: Ability to remain free from injury will improve Outcome: Progressing

## 2023-08-16 NOTE — Plan of Care (Addendum)
 VSS. Patient c/o chronic back, given heat packs, Tylenol and Oxycodone. Patient remains on 2L [place] with O2 sat >95% at rest. Patient continues to report DOE. LBM 3/19. No acute events overnight.  Problem: Education: Goal: Knowledge of General Education information will improve Description: Including pain rating scale, medication(s)/side effects and non-pharmacologic comfort measures Outcome: Progressing   Problem: Clinical Measurements: Goal: Ability to maintain clinical measurements within normal limits will improve Outcome: Progressing Goal: Will remain free from infection Outcome: Progressing Goal: Respiratory complications will improve Outcome: Progressing   Problem: Activity: Goal: Risk for activity intolerance will decrease Outcome: Progressing   Problem: Pain Managment: Goal: General experience of comfort will improve and/or be controlled Outcome: Progressing   Problem: Safety: Goal: Ability to remain free from injury will improve Outcome: Progressing

## 2023-08-16 NOTE — Progress Notes (Addendum)
 PROGRESS NOTE    Amber [name]  UUV:[id number] DOB: Mar 04, 1941 DOA: 08/14/2023 PCP: [facility]  Chief Complaint  Patient presents with   Shortness of Breath    Brief Narrative:   83 yo with hx CAD, heart failure, PAD and multiple other medical issues here with progressive SOB with exertion.   Treating for heart failure.    Maybe ready for discharge in 1-2 days.  Assessment & Plan:   Principal Problem:   Acute respiratory failure with hypoxia (HCC) Active Problems:   Essential hypertension   Acquired hypothyroidism   Mixed hyperlipidemia   Coronary artery disease   Abnormal LFTs   Lumbar radiculopathy   Hypomagnesemia   Chronic systolic CHF (congestive heart failure) (HCC)  Dyspnea on Exertion Treating for presumed diastolic heart failure with findings concerning for edema on CT, though she doesn't have impressive edema on exam CT chest with diffuse bilateral interlobular septal thickening and patchy ground glass opacities (edema vs infection) Continue lasix, seems to have some mild improvement Will follow echo -> EF 60-65%, no RWMA, grade 1 diastolic dysfunction, moderately elevated PASP Hold abx for now, lower suspicion for this with subacute presentation over period of 4-5 weeks  Elevated LFT's Steatosis on [place] Mild, possibly volume related, improving with diuressis Holding statin for now  Weight Loss 20 lbs over past year per her report (per our records, weight 79.4 kg in April 2024.  74.4 kg now). Needs additional w/u outpatient  RD c/s  Hypothyroidism Synthroid  Hypertension Amlodipine, Metoprolol Hydrochlorothiazide on hold with diuresis with loop diuretic above.  Losartan on hold.  Consider holding losartan at discharge if BP's ok if starting new loop.   Chronic Pain  Lumbar Radiculopathy Oxycodone prn, tylenol prn  Gabapentin  Overactive Bladder Oxybutynin  GERD PPI  CAD Aspirin Metoprolol Holding statin     DVT  prophylaxis: SCD Code Status: DNR Family Communication: none at bedside Disposition:   Status is: Inpatient Remains inpatient appropriate because: need for continued inpatient w/u of DOE   Consultants:  none  Procedures:  LE [place] Summary:  RIGHT:  - No evidence of common femoral vein obstruction.         LEFT:      - There is no evidence of deep vein thrombosis in the lower extremity.    - No cystic structure found in the popliteal fossa.      Antimicrobials:  Anti-infectives (From admission, onward)    Start     Dose/Rate Route Frequency Ordered Stop   08/15/23 2200  nitrofurantoin (macrocrystal-monohydrate) (MACROBID) capsule 100 mg        100 mg Oral Daily at bedtime 08/15/23 1813         Subjective: Feels Amber [name] little better - difficult to pin down, but she notes general improvement  Objective: Vitals:   08/15/23 2005 08/15/23 2119 08/16/23 0454 08/16/23 1259  BP: (!) 144/57 (!) 134/56 (!) 140/60 (!) 100/54  Pulse: 92 84 69 66  Resp: 18  18 16   Temp: 97.6 F (36.4 C)  97.6 F (36.4 C) 98.1 F (36.7 C)  TempSrc:      SpO2: 96% 97% 100% 94%  Weight:      Height:        Intake/Output Summary (Last 24 hours) at 08/16/2023 1735 Last data filed at 08/16/2023 1328 Gross per 24 hour  Intake 360 ml  Output 3 ml  Net 357 ml   Filed Weights   08/14/23 1221  Weight: 74.4  kg    Examination:  General: No acute distress. Cardiovascular: RRR Lungs: Clear to auscultation bilaterally  Abdomen: Soft, nontender, nondistended  Neurological: Alert and oriented 3. Moves all extremities 4 with equal strength. Cranial nerves II through XII grossly intact. Extremities: No clubbing or cyanosis. No edema.   Data Reviewed: I have personally reviewed following labs and imaging studies  CBC: Recent Labs  Lab 08/10/23 0918 08/14/23 1218 08/15/23 0606 08/16/23 0551  WBC 16.9* 10.7* 9.8 8.4  NEUTROABS 12.9*  --   --  4.4  HGB 12.2 12.0 13.0 11.8*  HCT 35.8  35.6* 38.2 34.6*  MCV 95 94.4 93.4 94.5  PLT 186 155 171 152    Basic Metabolic Panel: Recent Labs  Lab 08/10/23 0918 08/14/23 1218 08/14/23 2003 08/15/23 0606 08/16/23 0551  NA 135 132*  --  134* 132*  K 4.1 4.0  --  3.8 4.0  CL 100 99  --  99 97*  CO2 19* 24  --  25 25  GLUCOSE 93 92  --  94 96  BUN 12 12  --  12 17  CREATININE 0.79 0.66  --  0.71 0.60  CALCIUM 9.7 9.2  --  9.3 9.0  MG  --   --  1.6* 2.2 1.8  PHOS  --   --  4.0 4.4 4.7*    GFR: Estimated Creatinine Clearance: 46.3 mL/min (by C-G formula based on SCr of 0.6 mg/dL).  Liver Function Tests: Recent Labs  Lab 08/14/23 1218 08/15/23 0606 08/16/23 0551  AST 129* 134* 78*  ALT 92* 99* 71*  ALKPHOS 119 128* 109  BILITOT 1.5* 1.4* 1.0  PROT 7.2 7.5 6.8  ALBUMIN 3.4* 3.4* 3.1*    CBG: No results for input(s): "GLUCAP" in the last 168 hours.   Recent Results (from the past 240 hours)  Resp panel by RT-PCR (RSV, Flu Amber [name]&B, Covid) Urine, Clean Catch     Status: None   Collection Time: 08/14/23 11:06 PM   Specimen: Urine, Clean Catch; Nasal Swab  Result Value Ref Range Status   SARS Coronavirus 2 by RT PCR NEGATIVE NEGATIVE Final    Comment: (NOTE) SARS-CoV-2 target nucleic acids are NOT DETECTED.  The SARS-CoV-2 RNA is generally detectable in upper respiratory specimens during the acute phase of infection. The lowest concentration of SARS-CoV-2 viral copies this assay can detect is 138 copies/mL. Amber [name] negative result does not preclude SARS-Cov-2 infection and should not be used as the sole basis for treatment or other patient management decisions. Amber [name] negative result may occur with  improper specimen collection/handling, submission of specimen other than nasopharyngeal swab, presence of viral mutation(s) within the areas targeted by this assay, and inadequate number of viral copies(<138 copies/mL). Amber [name] negative result must be combined with clinical observations, patient history, and  epidemiological information. The expected result is Negative.  Fact Sheet for Patients:  BloggerCourse.com  Fact Sheet for Healthcare Providers:  SeriousBroker.it  This test is no t yet approved or cleared by the [place] FDA and  has been authorized for detection and/or diagnosis of SARS-CoV-2 by FDA under an Emergency Use Authorization (EUA). This EUA will remain  in effect (meaning this test can be used) for the duration of the COVID-19 declaration under Section 564(b)(1) of the Act, 21 U.S.C.section 360bbb-3(b)(1), unless the authorization is terminated  or revoked sooner.       Influenza Amber [name] by PCR NEGATIVE NEGATIVE Final   Influenza B by PCR NEGATIVE NEGATIVE Final  Comment: (NOTE) The Xpert Xpress SARS-CoV-2/FLU/RSV plus assay is intended as an aid in the diagnosis of influenza from Nasopharyngeal swab specimens and should not be used as Amber [name] sole basis for treatment. Nasal washings and aspirates are unacceptable for Xpert Xpress SARS-CoV-2/FLU/RSV testing.  Fact Sheet for Patients: BloggerCourse.com  Fact Sheet for Healthcare Providers: SeriousBroker.it  This test is not yet approved or cleared by the [place] FDA and has been authorized for detection and/or diagnosis of SARS-CoV-2 by FDA under an Emergency Use Authorization (EUA). This EUA will remain in effect (meaning this test can be used) for the duration of the COVID-19 declaration under Section 564(b)(1) of the Act, 21 U.S.C. section 360bbb-3(b)(1), unless the authorization is terminated or revoked.     Resp Syncytial Virus by PCR NEGATIVE NEGATIVE Final    Comment: (NOTE) Fact Sheet for Patients: BloggerCourse.com  Fact Sheet for Healthcare Providers: SeriousBroker.it  This test is not yet approved or cleared by the [place] FDA and has been  authorized for detection and/or diagnosis of SARS-CoV-2 by FDA under an Emergency Use Authorization (EUA). This EUA will remain in effect (meaning this test can be used) for the duration of the COVID-19 declaration under Section 564(b)(1) of the Act, 21 U.S.C. section 360bbb-3(b)(1), unless the authorization is terminated or revoked.  Performed at [facility], [address] [place], [place] [address]          Radiology Studies: ECHOCARDIOGRAM COMPLETE Result Date: 08/15/2023    ECHOCARDIOGRAM REPORT   Patient Name:   LUDWIKA RODD Date of Exam: 08/15/2023 Medical Rec #:  [id number]     Height:       60.0 in Accession #:    [id number]    Weight:       164.0 lb Date of Birth:  [birth year]     BSA:          1.716 m Patient Age:    83 years      BP:           150/89 mmHg Patient Gender: F             HR:           84 bpm. Exam Location:  Inpatient Procedure: 2D Echo, Cardiac Doppler, Color Doppler and Intracardiac            Opacification Agent (Both Spectral and Color Flow Doppler were            utilized during procedure). Indications:    Dyspnea  History:        Patient has prior history of Echocardiogram examinations, most                 recent 04/08/2022. Risk Factors:Hypertension, Dyslipidemia and                 Former Smoker.  Sonographer:    Karma Ganja Referring Phys: [id number] ANASTASSIA DOUTOVA IMPRESSIONS  1. Left ventricular ejection fraction, by estimation, is 60 to 65%. The left ventricle has normal function. The left ventricle has no regional wall motion abnormalities. Left ventricular diastolic parameters are consistent with Grade I diastolic dysfunction (impaired relaxation). Elevated left ventricular end-diastolic pressure.  2. Right ventricular systolic function is normal. The right ventricular size is normal. There is moderately elevated pulmonary artery systolic pressure.  3. Left atrial size was moderately dilated.  4. The mitral valve is normal in structure.  Trivial mitral valve regurgitation. No evidence of mitral stenosis.  5. The  aortic valve is tricuspid. Aortic valve regurgitation is not visualized. No aortic stenosis is present.  6. The inferior vena cava is dilated in size with >50% respiratory variability, suggesting right atrial pressure of 8 mmHg. FINDINGS  Left Ventricle: Left ventricular ejection fraction, by estimation, is 60 to 65%. The left ventricle has normal function. The left ventricle has no regional wall motion abnormalities. Definity contrast agent was given IV to delineate the left ventricular  endocardial borders. The left ventricular internal cavity size was normal in size. There is no left ventricular hypertrophy. Left ventricular diastolic parameters are consistent with Grade I diastolic dysfunction (impaired relaxation). Elevated left ventricular end-diastolic pressure. Right Ventricle: The right ventricular size is normal. No increase in right ventricular wall thickness. Right ventricular systolic function is normal. There is moderately elevated pulmonary artery systolic pressure. The tricuspid regurgitant velocity is 3.12 m/s, and with an assumed right atrial pressure of 8 mmHg, the estimated right ventricular systolic pressure is 46.9 mmHg. Left Atrium: Left atrial size was moderately dilated. Right Atrium: Right atrial size was normal in size. Pericardium: There is no evidence of pericardial effusion. Mitral Valve: The mitral valve is normal in structure. Mild mitral annular calcification. Trivial mitral valve regurgitation. No evidence of mitral valve stenosis. MV peak gradient, 6.4 mmHg. The mean mitral valve gradient is 4.0 mmHg. Tricuspid Valve: The tricuspid valve is normal in structure. Tricuspid valve regurgitation is mild . No evidence of tricuspid stenosis. Aortic Valve: The aortic valve is tricuspid. Aortic valve regurgitation is not visualized. No aortic stenosis is present. Aortic valve mean gradient measures 5.0 mmHg. Aortic  valve peak gradient measures 8.6 mmHg. Aortic valve area, by VTI measures 2.07 cm. Pulmonic Valve: The pulmonic valve was normal in structure. Pulmonic valve regurgitation is not visualized. No evidence of pulmonic stenosis. Aorta: The aortic root is normal in size and structure. Venous: The inferior vena cava is dilated in size with greater than 50% respiratory variability, suggesting right atrial pressure of 8 mmHg. IAS/Shunts: No atrial level shunt detected by color flow Doppler.  LEFT VENTRICLE PLAX 2D LVIDd:         5.20 cm      Diastology LVIDs:         3.60 cm      LV e' medial:    5.11 cm/s LV PW:         1.00 cm      LV E/e' medial:  22.1 LV IVS:        0.80 cm      LV e' lateral:   8.16 cm/s LVOT diam:     1.90 cm      LV E/e' lateral: 13.8 LV SV:         56 LV SV Index:   33 LVOT Area:     2.84 cm  LV Volumes (MOD) LV vol d, MOD A2C: 100.0 ml LV vol d, MOD A4C: 90.6 ml LV vol s, MOD A2C: 37.5 ml LV vol s, MOD A4C: 34.2 ml LV SV MOD A2C:     62.5 ml LV SV MOD A4C:     90.6 ml LV SV MOD BP:      60.5 ml RIGHT VENTRICLE             IVC RV Basal diam:  3.40 cm     IVC diam: 2.10 cm RV S prime:     11.20 cm/s TAPSE (M-mode): 2.9 cm LEFT ATRIUM  Index        RIGHT ATRIUM           Index LA diam:        3.50 cm 2.04 cm/m   RA Area:     17.30 cm LA Vol (A2C):   51.2 ml 29.84 ml/m  RA Volume:   48.60 ml  28.33 ml/m LA Vol (A4C):   67.3 ml 39.23 ml/m LA Biplane Vol: 59.9 ml 34.91 ml/m  AORTIC VALVE AV Area (Vmax):    2.08 cm AV Area (Vmean):   2.15 cm AV Area (VTI):     2.07 cm AV Vmax:           147.00 cm/s AV Vmean:          99.100 cm/s AV VTI:            0.272 m AV Peak Grad:      8.6 mmHg AV Mean Grad:      5.0 mmHg LVOT Vmax:         108.00 cm/s LVOT Vmean:        75.000 cm/s LVOT VTI:          0.199 m LVOT/AV VTI ratio: 0.73  AORTA Ao Root diam: 2.90 cm MITRAL VALVE                TRICUSPID VALVE MV Area (PHT): 3.77 cm     TR Peak grad:   38.9 mmHg MV Area VTI:   1.64 cm     TR Vmax:         312.00 cm/s MV Peak grad:  6.4 mmHg MV Mean grad:  4.0 mmHg     SHUNTS MV Vmax:       1.26 m/s     Systemic VTI:  0.20 m MV Vmean:      96.5 cm/s    Systemic Diam: 1.90 cm MV Decel Time: 201 msec MV E velocity: 113.00 cm/s MV Collen Hostler velocity: 121.00 cm/s MV E/Daanya Lanphier ratio:  0.93 Amber Si MD Electronically signed by Amber Si MD Signature Date/Time: 08/15/2023/4:29:24 PM    Final    US Abdomen Limited RUQ (LIVER/GB) Result Date: 08/15/2023 CLINICAL DATA:  Elevated LFTs EXAM: ULTRASOUND ABDOMEN LIMITED RIGHT UPPER QUADRANT COMPARISON:  CT 03/01/2023 FINDINGS: Gallbladder: No gallstones or wall thickening visualized. No sonographic Murphy sign noted by sonographer. Common bile duct: Diameter: Normal caliber, 2 mm Liver: Increased echotexture compatible with fatty infiltration. No focal abnormality or biliary ductal dilatation. Portal vein is patent on color Doppler imaging with normal direction of blood flow towards the liver. Other: None. IMPRESSION: Hepatic steatosis. No acute findings. Electronically Signed   By: Charlett Nose M.D.   On: 08/15/2023 00:24   CT Angio Chest PE W and/or Wo Contrast Result Date: 08/14/2023 CLINICAL DATA:  Shortness of breath. Positive D-dimer. PE suspected EXAM: CT ANGIOGRAPHY CHEST WITH CONTRAST TECHNIQUE: Multidetector CT imaging of the chest was performed using the standard protocol during bolus administration of intravenous contrast. Multiplanar CT image reconstructions and MIPs were obtained to evaluate the vascular anatomy. RADIATION DOSE REDUCTION: This exam was performed according to the departmental dose-optimization program which includes automated exposure control, adjustment of the mA and/or kV according to patient size and/or use of iterative reconstruction technique. CONTRAST:  75mL OMNIPAQUE IOHEXOL 350 MG/ML SOLN COMPARISON:  Radiograph 08/14/2023 and CTA chest 01/18/2023 FINDINGS: Cardiovascular: No pericardial effusion. Normal caliber aorta without  dissection. Aortic and coronary artery atherosclerotic calcification. Negative for acute pulmonary embolism. Mediastinum/Nodes: Small  hiatal hernia. Trachea is unremarkable. Subcentimeter mediastinal and hilar lymph nodes are likely reactive. Lungs/Pleura: Diffuse bilateral interlobular septal thickening and patchy ground-glass opacities. No pleural effusion or pneumothorax. Upper Abdomen: No acute abnormality. Musculoskeletal: No acute fracture. Review of the MIP images confirms the above findings. IMPRESSION: 1. Negative for acute pulmonary embolism. 2. Diffuse bilateral interlobular septal thickening and patchy ground-glass opacities may be due to pulmonary edema or atypical infection 3. Aortic Atherosclerosis (ICD10-I70.0). Electronically Signed   By: Minerva Fester M.D.   On: 08/14/2023 19:26        Scheduled Meds:  amLODipine  5 mg Oral Daily   aspirin EC  81 mg Oral Daily   furosemide  40 mg Intravenous Once   gabapentin  100 mg Oral TID   levothyroxine  50 mcg Oral Q0600   metoprolol tartrate  25 mg Oral BID   nitrofurantoin (macrocrystal-monohydrate)  100 mg Oral QHS   oxybutynin  5 mg Oral QHS   pantoprazole  20 mg Oral Daily   Ensure Max Protein  11 oz Oral Daily   sodium chloride flush  3 mL Intravenous Q12H   Continuous Infusions:     LOS: 2 days    Time spent: over 30 min    Lacretia Nicks, MD [facility]   To contact the attending provider between 7A-7P or the covering provider during after hours 7P-7A, please log into the web site [email or web address] and access using universal [place] password for that web site. If you do not have the password, please call the hospital operator.  08/16/2023, 5:35 PM

## 2023-08-16 NOTE — TOC Initial Note (Signed)
 Transition of Care [facility]) - Initial/Assessment Note    Patient Details  Name: Amber [name] MRN: [id number] Date of Birth: Feb 17, 1941  [facility]) CM/SW Contact:    Jessie Foot, RN Phone Number: 08/16/2023, 1:56 PM  Clinical Narrative:                 Pt from [facility]. Pt has DME (cane, RW, rollator, shower chair, wheelchair) she uses walker & rollator. Pt reports she likes to be independent. She eats all of her meals in the dining hall and attends all activities she is able to. Patient does use oxygen with ambulation.  TOC will continue to follow for discharge oxygen needs. FL2 & discharge summary will be sent at discharge. Patient said her son will transport to [facility] via private vehicle at discharge  Expected Discharge Plan: Assisted Living Barriers to Discharge: Continued Medical Work up   Patient Goals and CMS Choice Patient states their goals for this hospitalization and ongoing recovery are:: Return to  [facility] ([place])          Expected Discharge Plan and Services   Discharge Planning Services: CM Consult   Living arrangements for the past 2 months: Assisted Living Facility                 DME Arranged: N/A DME Agency: NA       HH Arranged: NA HH Agency: NA        Prior Living Arrangements/Services Living arrangements for the past 2 months: Assisted Living Facility Lives with:: Self, Facility Resident Patient language and need for interpreter reviewed:: Yes Do you feel safe going back to the place where you live?: Yes      Need for Family Participation in Patient Care: No (Comment) Care giver support system in place?: Yes (comment) Current home services: DME Criminal Activity/Legal Involvement Pertinent to Current Situation/Hospitalization: No - Comment as needed  Activities of Daily Living   ADL Screening (condition at time of admission) Independently performs ADLs?: Yes (appropriate for developmental age) Is the patient  deaf or have difficulty hearing?: No Does the patient have difficulty seeing, even when wearing glasses/contacts?: No Does the patient have difficulty concentrating, remembering, or making decisions?: No  Permission Sought/Granted Permission sought to share information with : Case Manager Permission granted to share information with : Yes, Verbal Permission Granted     Permission granted to share info w AGENCY: [facility]        Emotional Assessment Appearance:: Appears stated age Attitude/Demeanor/Rapport: Engaged Affect (typically observed): Accepting, Appropriate Orientation: : Oriented to Self, Oriented to Place, Oriented to  Time, Oriented to Situation Alcohol / Substance Use: Not Applicable Psych Involvement: No (comment)  Admission diagnosis:  Acute respiratory failure with hypoxia (HCC) [J96.01] Dyspnea, unspecified type [R06.00] Patient Active Problem List   Diagnosis Date Noted   Hypomagnesemia 08/15/2023   Chronic systolic CHF (congestive heart failure) (HCC) 08/15/2023   Acute respiratory failure with hypoxia (HCC) 08/14/2023   Adnexal mass 03/27/2023   Leukocytosis 04/16/2022   Septic shock (HCC) 04/16/2022   Hypoalbuminemia 04/16/2022   E coli bacteremia 04/16/2022   Coronary artery disease 04/16/2022   Abnormal LFTs 04/16/2022   PAD (peripheral artery disease) (HCC) 04/16/2022   Chronic back pain 04/16/2022   Lumbar radiculopathy 04/16/2022   Hip pain 04/16/2022   Constipation 04/16/2022   Obesity (BMI 30-39.9) 04/16/2022   Chest pain of uncertain etiology 04/10/2022   NSTEMI (non-ST elevated myocardial infarction) (HCC)  04/09/2022   E. coli UTI (urinary tract infection) 04/08/2022   Severe sepsis (HCC) 04/08/2022   Transient hypotension 04/08/2022   Acute metabolic encephalopathy 04/08/2022   Cellulitis, face 04/08/2022   Elevated troponin 04/08/2022   AKI (acute kidney injury) (HCC) 04/08/2022   Normocytic anemia 04/08/2022   Thrombocytopenia (HCC)  04/08/2022   Sepsis (HCC) 04/08/2022   Lumbosacral radiculopathy at S1 12/06/2020   Hyponatremia 12/06/2020   Mixed hyperlipidemia 12/06/2020   Osteopenia after menopause 12/06/2020   Class 1 obesity due to excess calories in adult 03/29/2020   Prediabetes 03/18/2020   Laryngopharyngeal reflux (LPR) 03/11/2019   Gastroesophageal reflux disease 01/19/2016   Hoarseness, chronic 12/28/2015   Complete left bundle branch block 12/08/2015   Essential hypertension 12/08/2015   Acquired hypothyroidism 12/08/2015   Peripheral vascular disease, unspecified (HCC) 04/18/2013   Chronic venous insufficiency 04/18/2013   PCP:  [facility]:   [facility] [place] - [address] [place] [address] Phone: [phone] Fax: [phone]     Social Drivers of Health (SDOH) Social History: SDOH Screenings   Food Insecurity: No Food Insecurity (08/15/2023)  Housing: Low Risk  (08/15/2023)  Transportation Needs: No Transportation Needs (08/15/2023)  Utilities: Not At Risk (08/15/2023)  Depression (PHQ2-9): Low Risk  (09/06/2022)  Financial Resource Strain: Low Risk  (11/08/2021)  Physical Activity: Inactive (11/08/2021)  Social Connections: Moderately Integrated (08/15/2023)  Stress: No Stress Concern Present (11/08/2021)  Tobacco Use: Medium Risk (08/14/2023)   SDOH Interventions:     Readmission Risk Interventions     No data to display

## 2023-08-17 ENCOUNTER — Telehealth: Payer: Self-pay | Admitting: Pulmonary Disease

## 2023-08-17 DIAGNOSIS — I5022 Chronic systolic (congestive) heart failure: Secondary | ICD-10-CM | POA: Diagnosis not present

## 2023-08-17 DIAGNOSIS — R0600 Dyspnea, unspecified: Secondary | ICD-10-CM | POA: Diagnosis not present

## 2023-08-17 DIAGNOSIS — N390 Urinary tract infection, site not specified: Secondary | ICD-10-CM

## 2023-08-17 DIAGNOSIS — I447 Left bundle-branch block, unspecified: Secondary | ICD-10-CM | POA: Diagnosis not present

## 2023-08-17 DIAGNOSIS — R7989 Other specified abnormal findings of blood chemistry: Secondary | ICD-10-CM | POA: Diagnosis not present

## 2023-08-17 DIAGNOSIS — J9601 Acute respiratory failure with hypoxia: Secondary | ICD-10-CM | POA: Diagnosis not present

## 2023-08-17 DIAGNOSIS — E039 Hypothyroidism, unspecified: Secondary | ICD-10-CM | POA: Diagnosis not present

## 2023-08-17 LAB — CBC WITH DIFFERENTIAL/PLATELET
Abs Immature Granulocytes: 0.05 10*3/uL (ref 0.00–0.07)
Basophils Absolute: 0 10*3/uL (ref 0.0–0.1)
Basophils Relative: 0 %
Eosinophils Absolute: 0.2 10*3/uL (ref 0.0–0.5)
Eosinophils Relative: 2 %
HCT: 34.2 % — ABNORMAL LOW (ref 36.0–46.0)
Hemoglobin: 11.9 g/dL — ABNORMAL LOW (ref 12.0–15.0)
Immature Granulocytes: 1 %
Lymphocytes Relative: 21 %
Lymphs Abs: 2 10*3/uL (ref 0.7–4.0)
MCH: 32.2 pg (ref 26.0–34.0)
MCHC: 34.8 g/dL (ref 30.0–36.0)
MCV: 92.7 fL (ref 80.0–100.0)
Monocytes Absolute: 1 10*3/uL (ref 0.1–1.0)
Monocytes Relative: 10 %
Neutro Abs: 6.2 10*3/uL (ref 1.7–7.7)
Neutrophils Relative %: 66 %
Platelets: 137 10*3/uL — ABNORMAL LOW (ref 150–400)
RBC: 3.69 MIL/uL — ABNORMAL LOW (ref 3.87–5.11)
RDW: 14.9 % (ref 11.5–15.5)
WBC: 9.4 10*3/uL (ref 4.0–10.5)
nRBC: 0 % (ref 0.0–0.2)

## 2023-08-17 LAB — COMPREHENSIVE METABOLIC PANEL
ALT: 66 U/L — ABNORMAL HIGH (ref 0–44)
AST: 79 U/L — ABNORMAL HIGH (ref 15–41)
Albumin: 3.2 g/dL — ABNORMAL LOW (ref 3.5–5.0)
Alkaline Phosphatase: 111 U/L (ref 38–126)
Anion gap: 8 (ref 5–15)
BUN: 18 mg/dL (ref 8–23)
CO2: 27 mmol/L (ref 22–32)
Calcium: 8.9 mg/dL (ref 8.9–10.3)
Chloride: 98 mmol/L (ref 98–111)
Creatinine, Ser: 0.7 mg/dL (ref 0.44–1.00)
GFR, Estimated: >60 mL/min (ref >60)
Glucose, Bld: 90 mg/dL (ref 70–99)
Potassium: 3.6 mmol/L (ref 3.5–5.1)
Sodium: 133 mmol/L — ABNORMAL LOW (ref 135–145)
Total Bilirubin: 1.2 mg/dL (ref 0.0–1.2)
Total Protein: 6.7 g/dL (ref 6.5–8.1)

## 2023-08-17 LAB — PHOSPHORUS: Phosphorus: 4 mg/dL (ref 2.5–4.6)

## 2023-08-17 LAB — MAGNESIUM: Magnesium: 1.7 mg/dL (ref 1.7–2.4)

## 2023-08-17 MED ORDER — ALUM & MAG HYDROXIDE-SIMETH 200-200-20 MG/5ML PO SUSP
30.0000 mL | Freq: Once | ORAL | Status: AC
  Administered 2023-08-17: 30 mL via ORAL
  Filled 2023-08-17: qty 30

## 2023-08-17 MED ORDER — PANTOPRAZOLE SODIUM 40 MG PO TBEC
40.0000 mg | DELAYED_RELEASE_TABLET | Freq: Every day | ORAL | Status: DC
  Administered 2023-08-18: 40 mg via ORAL
  Filled 2023-08-17: qty 1

## 2023-08-17 NOTE — TOC Progression Note (Signed)
 Transition of Care [facility]) - Progression Note    Patient Details  Name: Amber [name] MRN: [id number] Date of Birth: 1941-01-13  [facility]) CM/SW Contact  Jessie Foot, RN Phone Number: 08/17/2023, 9:33 AM  Clinical Narrative:    I spoke with [facility]). Patient can return to Rm 213. At discharge call [phone] ext. [phone]. Will need to fax [phone])  FL2 and Discharge Summary.   Expected Discharge Plan: Assisted Living Barriers to Discharge: Continued Medical Work up  Expected Discharge Plan and Services   Discharge Planning Services: CM Consult   Living arrangements for the past 2 months: Assisted Living Facility                 DME Arranged: N/A DME Agency: NA       HH Arranged: NA HH Agency: NA         Social Determinants of Health (SDOH) Interventions SDOH Screenings   Food Insecurity: No Food Insecurity (08/15/2023)  Housing: Low Risk  (08/15/2023)  Transportation Needs: No Transportation Needs (08/15/2023)  Utilities: Not At Risk (08/15/2023)  Depression (PHQ2-9): Low Risk  (09/06/2022)  Financial Resource Strain: Low Risk  (11/08/2021)  Physical Activity: Inactive (11/08/2021)  Social Connections: Moderately Integrated (08/15/2023)  Stress: No Stress Concern Present (11/08/2021)  Tobacco Use: Medium Risk (08/14/2023)    Readmission Risk Interventions     No data to display

## 2023-08-17 NOTE — Consult Note (Signed)
 NAME:  Amber [name], MRN:  [id number], DOB:  1940-10-30, LOS: 3 ADMISSION DATE:  08/14/2023, CONSULTATION DATE: 08/17/2023 REFERRING MD: Mauro Kaufmann, MD, CHIEF COMPLAINT: Acute hypoxic respiratory failure  History of Present Illness:   The patient is an 83 year old with PMH as below who presents with difficulty breathing and chest heaviness.  She experiences persistent difficulty breathing and a sensation of heaviness on her chest, described as feeling like 'a book's laying on my chest.' Episodes of near panic occur due to the inability to breathe. Extensive cardiac evaluations have been conducted, including checks for blood clots in the legs and lungs, and heart assessments. An x-ray revealed potential lung issues.  She has a history of coronary artery disease and left bundle branch block. No stents have been placed, and she has not been informed of any significant blockages. She also has a history of hypertension and high cholesterol, which were not considered severe at the time of diagnosis.  Significant weight loss is noted, with early satiety described as feeling full after only a few bites, as if being 'pushed under' her breasts. This has impacted her ability to eat normally. She also reports feeling tired almost all the time, which she finds exhausting.  She has a permanent urinary tract infection for which she has been on nitrofurantoin for over a year. This condition developed after sepsis in 2023, following the removal of her right ovary in October 2024. The UTI persists despite ongoing antibiotic treatment.  She resides in an assisted living facility due to her inability to care for herself following a severe episode of sepsis. She has not smoked since the age of 86 and has no current exposure to smoke, perfumes, or other potential respiratory irritants. She has no pets and has not been exposed to mold or other environmental factors that could contribute to lung issues.  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   Significant Hospital Events: Including procedures, antibiotic start and stop dates in addition to other pertinent events     Interim History / Subjective:    Objective   Blood pressure (!) 126/55, pulse 79, temperature 97.6 F (36.4 C), resp. rate 20, height 5' (1.524 m), weight 74.4 kg, SpO2 93%.        Intake/Output Summary (Last 24 hours) at 08/17/2023 1225 Last data filed at 08/17/2023 0900 Gross per 24 hour  Intake 360 ml  Output --  Net 360 ml   Filed Weights   08/14/23 1221  Weight: 74.4 kg    Examination: Blood pressure (!) 126/55, pulse 79, temperature 97.6 F (36.4 C), resp. rate 20, height 5' (1.524 m), weight 74.4 kg, SpO2 93%. Gen:      No acute distress HEENT:  EOMI, sclera anicteric Neck:     No masses; no thyromegaly Lungs:    Clear to auscultation bilaterally; normal respiratory effort CV:         Regular rate and rhythm; no murmurs Abd:      + bowel sounds; soft, non-tender; no palpable masses, no distension Ext:    No edema; adequate peripheral perfusion Skin:      Warm and dry; no rash Neuro: alert and oriented x 3 Psych: normal mood  and affect   Lab/imaging reviewed Significant for  CT angiogram on 08/14/2023 showing no pulm embolism, diffuse bilateral interlobular septal thickening, mild patchy groundglass opacities CT abdomen pelvis on 03/01/2023 with no acute findings in visualized lung base.  Resolved Hospital Problem list     Assessment & Plan:  Dyspnea and Chest Heaviness Experiencing dyspnea and chest heaviness, initially suspected to be cardiac in  origin. Coronary artery disease and left bundle branch block present. CT scan reveals mild lung base changes, possibly due to fluid or inflammation, not indicative of interstitial lung disease.  She had a CT abdomen pelvis in October 2024 which did not show any interstitial lung disease at visualized lung base.  Differential includes fluid overload from heart failure or mild pulmonary issue. Long-term nitrofurantoin use considered, but CT findings atypical for nitrofurantoin-induced lung disease. Antibiotic continuation advised to prevent UTI recurrence until further assessment.  - Will need high-resolution outpatient CT scan to reassess lungs in 1-2 months - Continue current heart failure treatment to manage fluid overload - Evaluate need for home oxygen before discharge - Check baseline serologies for connective tissue disease  Heart Failure Coronary artery disease and left bundle branch block present. Symptoms of chest heaviness and dyspnea may relate to fluid overload from heart failure. Current management focuses on fluid reduction. - Continue current heart failure management  Chronic Urinary Tract Infection (UTI) Chronic UTI managed with nitrofurantoin for over a year post-sepsis. CT scan does not show typical nitrofurantoin-induced lung changes. Antibiotic continuation advised to prevent infection recurrence until follow-up CT scan. - Continue nitrofurantoin for chronic UTI - Reassess antibiotic use after follow-up CT scan  Follow-up - Requires follow-up to reassess lung condition and manage ongoing symptoms.  - Schedule follow-up appointment in the pulmonary clinic in 1-2 months - Perform blood tests for connective tissue disease  Best Practice (right click and "Reselect all SmartList Selections" daily)   Per [facility]  Signature:   Chilton Greathouse MD [facility] See Amion for pager  If no response to pager , please call [phone] until 7pm After 7:00 pm call  Elink  [phone] 08/17/2023, 1:20 PM

## 2023-08-17 NOTE — Progress Notes (Signed)
 Occupational Therapy Treatment Patient Details Name: Amber [name] MRN: [id number] DOB: Jan 09, 1941 Today's Date: 08/17/2023   History of present illness 83 yo female admitted with acute resp failure. Hx of CAD, CHF, PVD, LBBB, R TKA, chronic pain, macular degeneration, DVT, OA, venours insufficiency, anemia   OT comments  Pt. Seen for skilled OT treatment session with focus on EC including the 5P's.  Provided handouts and examples for how to implement in home environment.  Pt. Endorses feeling weak, tired, and difficulty breathing now and prior to admission during any mobility and ADLs.  States she was needing to rest after each task.  Reviewed how implementation of these strategies will help her remain independent and safe during mobility and adls as she is getting stronger.  Reviewed PLB, pt. With good return demo.  Discussed signs/symptoms to notify someone immediately next time ie: reports of feeling "heavy brick on her chest" when she was laying down at night several nights prior to admission. Cont. With acute OT POC.         If plan is discharge home, recommend the following:  A little help with bathing/dressing/bathroom;A little help with walking and/or transfers;Assist for transportation   Equipment Recommendations  None recommended by OT    Recommendations for Other Services      Precautions / Restrictions Precautions Precautions: Fall       Mobility Bed Mobility                    Transfers                         Balance                                           ADL either performed or assessed with clinical judgement   ADL Overall ADL's : Needs assistance/impaired                                       General ADL Comments: provided ec handouts including 5Ps reviewed at length with examples provided.  pt. discussed needing to take seated rest break after each task leading up to hospital admission, describing  chestheaviness "like having a brick on her chest".  reviewed to always notify medical professional if she notices these feelings moving forward.    Extremity/Trunk Assessment              Vision       Restaurant manager, fast food Communication: No apparent difficulties   Cognition Arousal: Alert Behavior During Therapy: WFL for tasks assessed/performed                                 Following commands: Intact        Cueing   Cueing Techniques: Verbal cues  Exercises      Shoulder Instructions       General Comments nails interfere with pulse ox    Pertinent Vitals/ Pain       Pain Assessment Pain Assessment: Faces Faces Pain Scale: Hurts a little bit Pain Location: talking about stomach pressure and pains when shes eating Pain Descriptors / Indicators: Aching Pain  Intervention(s): Monitored during session  Home Living                                          Prior Functioning/Environment              Frequency  Min 1X/week        Progress Toward Goals  OT Goals(current goals can now be found in the care plan section)  Progress towards OT goals: Progressing toward goals     Plan      Co-evaluation                 AM-PAC OT "6 Clicks" Daily Activity     Outcome Measure   Help from another person eating meals?: None Help from another person taking care of personal grooming?: A Little Help from another person toileting, which includes using toliet, bedpan, or urinal?: A Little Help from another person bathing (including washing, rinsing, drying)?: A Little Help from another person to put on and taking off regular upper body clothing?: A Little Help from another person to put on and taking off regular lower body clothing?: A Little 6 Click Score: 19    End of Session    OT Visit Diagnosis: Muscle weakness (generalized) (M62.81);Other abnormalities of gait and mobility  (R26.89)   Activity Tolerance Patient tolerated treatment well   Patient Left in chair;with call bell/phone within reach   Nurse Communication Other (comment) (alerted rn who also spoked with md in the hall pts. concerns with not understanding her dx. questions related to resp. failure, hx. of CHF, and gerd that could be what she is describing with stomach pressure when and while shes trying to eat.)        Time: 1140-1207 OT Time Calculation (min): 27 min  Charges: OT General Charges $OT Visit: 1 Visit OT Treatments $Self Care/Home Management : 23-37 mins  Boneta Lucks, COTA/L Acute Rehabilitation [phone]   Alessandra Bevels Lorraine-COTA/L 08/17/2023, 1:14 PM

## 2023-08-17 NOTE — Plan of Care (Addendum)
 VSS. Patient c/o back pain and left lung "heaviness." Patient given heat packs, Tylenol and Oxycodone. Patient ambulating with walker within room with standby assist. LBM 3/21. No acute events overnight.  Problem: Education: Goal: Knowledge of General Education information will improve Description: Including pain rating scale, medication(s)/side effects and non-pharmacologic comfort measures Outcome: Progressing   Problem: Clinical Measurements: Goal: Ability to maintain clinical measurements within normal limits will improve Outcome: Progressing Goal: Will remain free from infection Outcome: Progressing Goal: Respiratory complications will improve Outcome: Progressing   Problem: Pain Managment: Goal: General experience of comfort will improve and/or be controlled Outcome: Progressing   Problem: Safety: Goal: Ability to remain free from injury will improve Outcome: Progressing

## 2023-08-17 NOTE — Progress Notes (Signed)
 Nutrition Follow-up  DOCUMENTATION CODES:   Obesity unspecified  INTERVENTION:   -Ensure MAX Protein po daily, each supplement provides 150 kcal and 30 grams of protein. Requested for breakfast time.  NUTRITION DIAGNOSIS:   Increased nutrient needs related to acute illness as evidenced by estimated needs.  Ongoing.  GOAL:   Patient will meet greater than or equal to 90% of their needs  Progressing.  MONITOR:   PO intake, Supplement acceptance, Weight trends  REASON FOR ASSESSMENT:   Consult Assessment of nutrition requirement/status  ASSESSMENT:   83 y.o. female with PMH significant of nonobstructive CAD, CHF, PVD, HTN, HLD, prediabetes who presented with shortness of breath for the past 6 weeks and admitted for acute respiratory failure.  Initial assessment completed 3/19. Patient currently consuming 100% of breakfast this morning (oatmeal, english muffin). Accepted 1 Ensure Max yesterday. Not today.   Admission weight: 164 lbs No other weights for admission.  Medications reviewed.  Labs reviewed: Low Na  Diet Order:   Diet Order             Diet Heart Room service appropriate? Yes; Fluid consistency: Thin  Diet effective now                   EDUCATION NEEDS:   Education needs have been addressed  Skin:  Skin Assessment: Reviewed RN Assessment  Last BM:  3/21  Height:   Ht Readings from Last 1 Encounters:  08/14/23 5' (1.524 m)    Weight:   Wt Readings from Last 1 Encounters:  08/14/23 74.4 kg    BMI:  Body mass index is 32.03 kg/m.  Estimated Nutritional Needs:   Kcal:  1550-1850 kcals  Protein:  75-90 grams  Fluid:  >/= 1.6L   Tilda Franco, MS, RD, LDN Inpatient Clinical Dietitian Contact via Secure chat

## 2023-08-17 NOTE — Telephone Encounter (Signed)
 Please make appointment with me or APP in 1 to 2 months in clinic.

## 2023-08-17 NOTE — Progress Notes (Signed)
 [facility]  PROGRESS NOTE  Amber [name]:[id number] DOB: 03/30/1941 DOA: 08/14/2023 PCP: [facility]   Brief HPI:   83 yo with hx CAD, heart failure, PAD and multiple other medical issues here with progressive SOB with exertion.       Assessment/Plan:   Dyspnea on Exertion -Unclear etiology, CT chest concerning for atypical infection versus pulmonary edema -Patient was given Lasix with very minimal improvement CT chest with diffuse bilateral interlobular septal thickening and patchy ground glass opacities (edema vs infection) Will follow echo -> EF 60-65%, no RWMA, grade 1 diastolic dysfunction, moderately elevated PASP Hold abx for now, lower suspicion for this with subacute presentation over period of 4-5 weeks -Will consult pulmonology for further recommendations  Chest heaviness -Symptoms going on for past many weeks -?  GERD, patient had negative cardiac workup as outpatient -Recently seen by cardiology on 3/14 -She has known structural CAD with previous cardiac cath showing mild disease     Elevated LFT's Steatosis on [place] Mild, possibly volume related, improving with diuressis Holding statin for now   Weight Loss 20 lbs over past year per her report (per our records, weight 79.4 kg in April 2024.  74.4 kg now). Needs additional w/u outpatient  RD c/s   Hypothyroidism Synthroid   Hypertension Amlodipine, Metoprolol Hydrochlorothiazide on hold with diuresis with loop diuretic above.  Losartan on hold.  Consider holding losartan at discharge if BP's ok if starting new loop.    Chronic Pain  Lumbar Radiculopathy Oxycodone prn, tylenol prn  Gabapentin   Overactive Bladder Oxybutynin   GERD PPI  CAD Aspirin Metoprolol Holding statin     Medications     amLODipine  5 mg Oral Daily   aspirin EC  81 mg Oral Daily   gabapentin  100 mg Oral TID   levothyroxine  50 mcg Oral Q0600   metoprolol tartrate  25 mg Oral BID    nitrofurantoin (macrocrystal-monohydrate)  100 mg Oral QHS   oxybutynin  5 mg Oral QHS   [START ON 08/18/2023] pantoprazole  40 mg Oral Daily   Ensure Max Protein  11 oz Oral Daily   sodium chloride flush  3 mL Intravenous Q12H     Data Reviewed:   CBG:  No results for input(s): "GLUCAP" in the last 168 hours.  SpO2: 93 % O2 Flow Rate (L/min): 2 L/min    Vitals:   08/16/23 2215 08/17/23 0359 08/17/23 1025 08/17/23 1027  BP: (!) 144/53 134/64 (!) 121/52 (!) 126/55  Pulse: 87 73 79   Resp:  20    Temp:  97.6 F (36.4 C)    TempSrc:      SpO2:  93%    Weight:      Height:          Data Reviewed:  Basic Metabolic Panel: Recent Labs  Lab 08/14/23 1218 08/14/23 2003 08/15/23 0606 08/16/23 0551 08/17/23 0530  NA 132*  --  134* 132* 133*  K 4.0  --  3.8 4.0 3.6  CL 99  --  99 97* 98  CO2 24  --  25 25 27   GLUCOSE 92  --  94 96 90  BUN 12  --  12 17 18   CREATININE 0.66  --  0.71 0.60 0.70  CALCIUM 9.2  --  9.3 9.0 8.9  MG  --  1.6* 2.2 1.8 1.7  PHOS  --  4.0 4.4 4.7* 4.0    CBC: Recent Labs  Lab 08/14/23 1218 08/15/23 0606 08/16/23 0551 08/17/23 0530  WBC 10.7* 9.8 8.4 9.4  NEUTROABS  --   --  4.4 6.2  HGB 12.0 13.0 11.8* 11.9*  HCT 35.6* 38.2 34.6* 34.2*  MCV 94.4 93.4 94.5 92.7  PLT 155 171 152 137*    LFT Recent Labs  Lab 08/14/23 1218 08/15/23 0606 08/16/23 0551 08/17/23 0530  AST 129* 134* 78* 79*  ALT 92* 99* 71* 66*  ALKPHOS 119 128* 109 111  BILITOT 1.5* 1.4* 1.0 1.2  PROT 7.2 7.5 6.8 6.7  ALBUMIN 3.4* 3.4* 3.1* 3.2*     Antibiotics: Anti-infectives (From admission, onward)    Start     Dose/Rate Route Frequency Ordered Stop   08/15/23 2200  nitrofurantoin (macrocrystal-monohydrate) (MACROBID) capsule 100 mg        100 mg Oral Daily at bedtime 08/15/23 1813          DVT prophylaxis: SCDs  Code Status: DNR  Family Communication: No family at bedside   CONSULTS    Subjective   Complains of chest  heaviness.  Objective    Physical Examination:   General-appears in no acute distress Heart-S1-S2, regular, no murmur auscultated Lungs-clear to auscultation bilaterally, no wheezing or crackles auscultated Abdomen-soft, nontender, no organomegaly Extremities-no edema in the lower extremities Neuro-alert, oriented x3, no focal deficit noted  Status is: Inpatient:             Meredeth Ide   [facility] If 7PM-7AM, please contact night-coverage at [email or web address], Office  [phone]   08/17/2023, 10:38 AM  LOS: 3 days

## 2023-08-17 NOTE — Progress Notes (Signed)
 Physical Therapy Treatment Patient Details Name: Amber [name] MRN: [id number] DOB: 06-02-1940 Today's Date: 08/17/2023   History of Present Illness 83 yo female admitted with acute resp failure. Hx of CAD, CHF, PVD, LBBB, R TKA, chronic pain, macular degeneration, DVT, OA, venours insufficiency, anemia    PT Comments  Pt tolerated session well, is able to remain asymptomatic throughout the session with amb bouts of 80 ft x2 at sup A with 2WW. O2 monitored but pt has long nails and poor pleth readings on both portable and room pulse ox machines. Pt shows good stability and support, is able to come to stand from reclining chair without physical assist. Upon return from amb, takes 2-3 steps without walker to sit. Pt left with call bell in recliner.     If plan is discharge home, recommend the following: A little help with walking and/or transfers;A little help with bathing/dressing/bathroom;Assistance with cooking/housework;Assist for transportation;Help with stairs or ramp for entrance   Can travel by private vehicle        Equipment Recommendations  None recommended by PT    Recommendations for Other Services       Precautions / Restrictions Precautions Precautions: Fall Restrictions Weight Bearing Restrictions Per Provider Order: No     Mobility  Bed Mobility               General bed mobility comments: oob in recliner. O2 92% on RA.    Transfers Overall transfer level: Needs assistance Equipment used: Rolling walker (2 wheels) Transfers: Sit to/from Stand Sit to Stand: Modified independent (Device/Increase time)           General transfer comment: Increased time    Ambulation/Gait Ambulation/Gait assistance: Supervision Gait Distance (Feet): 80 Feet Assistive device: Rolling walker (2 wheels) Gait Pattern/deviations: Step-through pattern, Wide base of support, Trunk flexed, Decreased stride length Gait velocity: dec     General Gait Details: 80 feet  twice (one bout on RA and one bout with 2L O2), variable readings with pulse ox during these trials, ranging from 75%-100% (fluctuating pleth) likely due to pt nails. She is able to complete reciprocal movements with use of 2WW which is familiar to her during community amb. Wide turns with forward movement durign directional changes   Stairs             Wheelchair Mobility     Tilt Bed    Modified Rankin (Stroke Patients Only)       Balance Overall balance assessment: Needs assistance Sitting-balance support: No upper extremity supported, Feet supported Sitting balance-Leahy Scale: Good     Standing balance support: During functional activity, Reliant on assistive device for balance, Single extremity supported Standing balance-Leahy Scale: Good                              Communication Communication Communication: No apparent difficulties  Cognition Arousal: Alert Behavior During Therapy: WFL for tasks assessed/performed   PT - Cognitive impairments: No apparent impairments                         Following commands: Intact      Cueing Cueing Techniques: Verbal cues  Exercises      General Comments General comments (skin integrity, edema, etc.): nails interfere with pulse ox      Pertinent Vitals/Pain Pain Assessment Pain Assessment: No/denies pain Pain Intervention(s): Monitored during session    Home Living  Prior Function            PT Goals (current goals can now be found in the care plan section) Acute Rehab PT Goals Patient Stated Goal: breathe better PT Goal Formulation: With patient Potential to Achieve Goals: Good Progress towards PT goals: Progressing toward goals    Frequency    Min 1X/week      PT Plan      Co-evaluation              AM-PAC PT "6 Clicks" Mobility   Outcome Measure  Help needed turning from your back to your side while in a flat bed without  using bedrails?: A Little Help needed moving from lying on your back to sitting on the side of a flat bed without using bedrails?: A Little Help needed moving to and from a bed to a chair (including a wheelchair)?: None Help needed standing up from a chair using your arms (e.g., wheelchair or bedside chair)?: None Help needed to walk in hospital room?: A Little Help needed climbing 3-5 steps with a railing? : A Lot 6 Click Score: 19    End of Session Equipment Utilized During Treatment: Gait belt Activity Tolerance: Patient tolerated treatment well Patient left: with call bell/phone within reach;in chair;with chair alarm set Nurse Communication: Mobility status PT Visit Diagnosis: Difficulty in walking, not elsewhere classified (R26.2)     Time: 4098-1191 PT Time Calculation (min) (ACUTE ONLY): 20 min  Charges:    $Gait Training: 8-22 mins PT General Charges $$ ACUTE PT VISIT: 1 Visit                     Madaline Guthrie, PT Acute Rehabilitation Services Office: [phone] 08/17/2023    Evelena Peat 08/17/2023, 12:52 PM

## 2023-08-17 NOTE — Telephone Encounter (Signed)
 Lm for patient.  Please schedule appt when patient calls back.

## 2023-08-18 DIAGNOSIS — R7989 Other specified abnormal findings of blood chemistry: Secondary | ICD-10-CM | POA: Diagnosis not present

## 2023-08-18 DIAGNOSIS — J9601 Acute respiratory failure with hypoxia: Secondary | ICD-10-CM | POA: Diagnosis not present

## 2023-08-18 DIAGNOSIS — E039 Hypothyroidism, unspecified: Secondary | ICD-10-CM | POA: Diagnosis not present

## 2023-08-18 DIAGNOSIS — I5022 Chronic systolic (congestive) heart failure: Secondary | ICD-10-CM | POA: Diagnosis not present

## 2023-08-18 LAB — SJOGRENS SYNDROME-A EXTRACTABLE NUCLEAR ANTIBODY: SSA (Ro) (ENA) Antibody, IgG: <0.2 AI (ref 0.0–0.9)

## 2023-08-18 LAB — SJOGRENS SYNDROME-B EXTRACTABLE NUCLEAR ANTIBODY: SSB (La) (ENA) Antibody, IgG: <0.2 AI (ref 0.0–0.9)

## 2023-08-18 LAB — SCLERODERMA DIAGNOSTIC PROFILE
Anti Nuclear Antibody (ANA): NEGATIVE
Scleroderma (Scl-70) (ENA) Antibody, IgG: <0.2 AI (ref 0.0–0.9)

## 2023-08-18 LAB — ANA W/REFLEX IF POSITIVE: Anti Nuclear Antibody (ANA): NEGATIVE

## 2023-08-18 LAB — RHEUMATOID FACTOR: Rheumatoid fact SerPl-aCnc: <10 [IU]/mL (ref <14.0)

## 2023-08-18 MED ORDER — FUROSEMIDE 20 MG PO TABS: 20.0000 mg | ORAL_TABLET | Freq: Every day | ORAL | 3 refills | Status: AC

## 2023-08-18 NOTE — [place] FL2 (Signed)
 [place] MEDICAID [id number] LEVEL OF CARE FORM     IDENTIFICATION  Patient Name: Amber [name] Birthdate: [birth year] Sex: female Admission Date (Current Location): 08/14/2023  [facility] and [place] Number:  Producer, television/film/video and Address:  [facility],  [address] [place], [place] [address]      Provider Number: [id number]  Attending Physician Name and Address:  Meredeth Ide, MD  Relative Name and Phone Number:  Shadae, Reino [phone])  [phone] [facility])    Current Level of Care: Hospital Recommended Level of Care: Assisted Living Facility Prior Approval Number:    Date Approved/Denied:   PASRR Number:    Discharge Plan: Other (Comment) (ALF)    Current Diagnoses: Patient Active Problem List   Diagnosis Date Noted   Hypomagnesemia 08/15/2023   Chronic systolic CHF (congestive heart failure) (HCC) 08/15/2023   Acute respiratory failure with hypoxia (HCC) 08/14/2023   Adnexal mass 03/27/2023   Leukocytosis 04/16/2022   Septic shock (HCC) 04/16/2022   Hypoalbuminemia 04/16/2022   E coli bacteremia 04/16/2022   Coronary artery disease 04/16/2022   Abnormal LFTs 04/16/2022   PAD (peripheral artery disease) (HCC) 04/16/2022   Chronic back pain 04/16/2022   Lumbar radiculopathy 04/16/2022   Hip pain 04/16/2022   Constipation 04/16/2022   Obesity (BMI 30-39.9) 04/16/2022   Chest pain of uncertain etiology 04/10/2022   NSTEMI (non-ST elevated myocardial infarction) (HCC) 04/09/2022   E. coli UTI (urinary tract infection) 04/08/2022   Severe sepsis (HCC) 04/08/2022   Transient hypotension 04/08/2022   Acute metabolic encephalopathy 04/08/2022   Cellulitis, face 04/08/2022   Elevated troponin 04/08/2022   AKI (acute kidney injury) (HCC) 04/08/2022   Normocytic anemia 04/08/2022   Thrombocytopenia (HCC) 04/08/2022   Sepsis (HCC) 04/08/2022   Lumbosacral radiculopathy at S1 12/06/2020   Hyponatremia 12/06/2020   Mixed hyperlipidemia 12/06/2020   Osteopenia after  menopause 12/06/2020   Class 1 obesity due to excess calories in adult 03/29/2020   Prediabetes 03/18/2020   Laryngopharyngeal reflux (LPR) 03/11/2019   Gastroesophageal reflux disease 01/19/2016   Hoarseness, chronic 12/28/2015   Complete left bundle branch block 12/08/2015   Essential hypertension 12/08/2015   Acquired hypothyroidism 12/08/2015   Peripheral vascular disease, unspecified (HCC) 04/18/2013   Chronic venous insufficiency 04/18/2013    Orientation RESPIRATION BLADDER Height & Weight     Self  O2 (2L) Continent Weight: 164 lb (74.4 kg) Height:  5' (152.4 cm)  BEHAVIORAL SYMPTOMS/MOOD NEUROLOGICAL BOWEL NUTRITION STATUS      Continent Diet  AMBULATORY STATUS COMMUNICATION OF NEEDS Skin   Independent Verbally Normal                       Personal Care Assistance Level of Assistance  Bathing, Feeding, Dressing Bathing Assistance: Limited assistance Feeding assistance: Independent Dressing Assistance: Limited assistance     Functional Limitations Info  Sight, Hearing, Speech Sight Info: Adequate Hearing Info: Adequate Speech Info: Adequate    SPECIAL CARE FACTORS FREQUENCY                       Contractures Contractures Info: Not present    Additional Factors Info  Code Status, Allergies Code Status Info: DNR Allergies Info: Sulfa Antibiotics  Codeine  Darvocet (Propoxyphene N-acetaminophen)  Nickel  Other  Vicodin (Hydrocodone-acetaminophen)           Current Medications (08/18/2023):  This is the current hospital active medication list Current Facility-Administered Medications  Medication Dose Route Frequency  Provider Last Rate Last Admin   acetaminophen (TYLENOL) tablet 650 mg  650 mg Oral Q6H PRN Zigmund Daniel., MD   650 mg at 08/18/23 0512   albuterol (PROVENTIL) (2.5 MG/3ML) 0.083% nebulizer solution 3 mL  3 mL Inhalation Q4H PRN Therisa Doyne, MD       amLODipine (NORVASC) tablet 5 mg  5 mg Oral Daily Doutova,  Anastassia, MD   5 mg at 08/18/23 0955   aspirin EC tablet 81 mg  81 mg Oral Daily Doutova, Jonny Ruiz, MD   81 mg at 08/18/23 0955   gabapentin (NEURONTIN) capsule 100 mg  100 mg Oral TID Therisa Doyne, MD   100 mg at 08/18/23 1507   levothyroxine (SYNTHROID) tablet 50 mcg  50 mcg Oral Q0600 Therisa Doyne, MD   50 mcg at 08/18/23 1610   metoprolol tartrate (LOPRESSOR) tablet 25 mg  25 mg Oral BID Zigmund Daniel., MD   25 mg at 08/18/23 9604   nitrofurantoin (macrocrystal-monohydrate) (MACROBID) capsule 100 mg  100 mg Oral QHS Zigmund Daniel., MD   100 mg at 08/17/23 2146   ondansetron (ZOFRAN) tablet 4 mg  4 mg Oral Q6H PRN Therisa Doyne, MD       Or   ondansetron (ZOFRAN) injection 4 mg  4 mg Intravenous Q6H PRN Therisa Doyne, MD       Oral care mouth rinse  15 mL Mouth Rinse PRN Therisa Doyne, MD       oxybutynin (DITROPAN-XL) 24 hr tablet 5 mg  5 mg Oral QHS Zigmund Daniel., MD   5 mg at 08/17/23 2146   oxyCODONE (Oxy IR/ROXICODONE) immediate release tablet 5 mg  5 mg Oral Q6H PRN Therisa Doyne, MD   5 mg at 08/18/23 0512   pantoprazole (PROTONIX) EC tablet 40 mg  40 mg Oral Daily Meredeth Ide, MD   40 mg at 08/18/23 5409   polyvinyl alcohol (LIQUIFILM TEARS) 1.4 % ophthalmic solution 1 drop  1 drop Both Eyes PRN Zigmund Daniel., MD   1 drop at 08/16/23 8119   protein supplement (ENSURE MAX) liquid  11 oz Oral Daily Zigmund Daniel., MD   11 oz at 08/16/23 1478   sodium chloride flush (NS) 0.9 % injection 3 mL  3 mL Intravenous Q12H Doutova, Jonny Ruiz, MD   3 mL at 08/18/23 0950   sodium chloride flush (NS) 0.9 % injection 3 mL  3 mL Intravenous PRN Therisa Doyne, MD   3 mL at 08/15/23 0129     Discharge Medications: STOP taking these medications     losartan-hydrochlorothiazide 100-12.5 MG tablet Commonly known as: HYZAAR           TAKE these medications     acetaminophen 500 MG tablet Commonly known  as: TYLENOL Take 1,000 mg by mouth 3 (three) times daily.    albuterol 108 (90 Base) MCG/ACT inhaler Commonly known as: VENTOLIN HFA Inhale 2 puffs into the lungs every 4 (four) hours as needed for wheezing.    amLODipine 5 MG tablet Commonly known as: NORVASC Take 5 mg by mouth daily.    aspirin EC 81 MG tablet Take 1 tablet (81 mg total) by mouth daily. Swallow whole.    atorvastatin 20 MG tablet Commonly known as: LIPITOR Take 20 mg by mouth at bedtime.    AZO CRANBERRY PO Take 1 tablet by mouth daily.    calcium carbonate 750 MG chewable tablet Commonly known as:  TUMS EX Chew 1 tablet by mouth every 4 (four) hours as needed for heartburn (or indigestion).    clobetasol ointment 0.05 % Commonly known as: TEMOVATE Apply 1 Application topically daily as needed (for eczema, in the evening).    cyclobenzaprine 10 MG tablet Commonly known as: FLEXERIL Take 1 tablet (10 mg total) by mouth 3 (three) times daily as needed for muscle spasms. What changed: when to take this    diclofenac Sodium 1 % Gel Commonly known as: VOLTAREN Apply 2 g topically 3 (three) times daily as needed (joint pain).    furosemide 20 MG tablet Commonly known as: Lasix Take 1 tablet (20 mg total) by mouth daily.    gabapentin 100 MG capsule Commonly known as: NEURONTIN TAKE 1 CAPSULE(100 MG) BY MOUTH THREE TIMES DAILY What changed: See the new instructions.    levothyroxine 50 MCG tablet Commonly known as: SYNTHROID Take 1 tablet (50 mcg total) by mouth daily before breakfast.    metoprolol tartrate 25 MG tablet Commonly known as: LOPRESSOR Take 1 tablet (25 mg total) by mouth 2 (two) times daily. What changed:  when to take this additional instructions    Mucus Relief 600 MG 12 hr tablet Generic drug: guaiFENesin Take 600 mg by mouth 2 (two) times daily.    nitrofurantoin (macrocrystal-monohydrate) 100 MG capsule Commonly known as: MACROBID Take 100 mg by mouth at bedtime.     nitroGLYCERIN 0.4 MG SL tablet Commonly known as: NITROSTAT Place 1 tablet (0.4 mg total) under the tongue every 5 (five) minutes as needed for chest pain. What changed:  when to take this reasons to take this    nystatin powder Commonly known as: MYCOSTATIN/NYSTOP Apply 1 Application topically daily as needed (for rash/redness).    ondansetron 4 MG tablet Commonly known as: ZOFRAN Take 1 tablet (4 mg total) by mouth every 6 (six) hours as needed for nausea. What changed: reasons to take this    OVER THE COUNTER MEDICATION Place 1 drop into both eyes See admin instructions. Refresh Classic Eye Drops- Instill 1 drop into both eyes in the morning and at bedtime    oxybutynin 5 MG 24 hr tablet Commonly known as: DITROPAN-XL Take 5 mg by mouth at bedtime.    oxyCODONE 5 MG immediate release tablet Commonly known as: Oxy IR/ROXICODONE Take 1 tablet (5 mg total) by mouth every 6 (six) hours as needed for severe pain. What changed:  when to take this additional instructions    pantoprazole 20 MG tablet Commonly known as: PROTONIX Take 20 mg by mouth 2 (two) times daily.    GoodSense ClearLax 17 GM/SCOOP powder Generic drug: polyethylene glycol powder Take 17 g by mouth daily.    polyethylene glycol 17 g packet Commonly known as: MIRALAX / GLYCOLAX Take 17 g by mouth daily.    potassium chloride SA 20 MEQ tablet Commonly known as: KLOR-CON M Take 20 mEq by mouth daily.    PRESERVISION AREDS PO Take 1 capsule by mouth 2 (two) times daily.    Refresh Tears 0.5 % Soln Generic drug: carboxymethylcellulose Place 2 drops into both eyes 4 (four) times daily as needed (foir redness or irritation).    senna-docusate 8.6-50 MG tablet Commonly known as: Senokot-S Take 1 tablet by mouth at bedtime.    traMADol 50 MG tablet Commonly known as: ULTRAM Take 50 mg by mouth every 12 (twelve) hours as needed (for pain).    Vitamin D3 25 MCG (1000 UT) Caps Take 1,000  Units by  mouth daily.    Vitamin-B Complex Tabs Take 1 tablet by mouth daily.      Relevant Imaging Results:  Relevant Lab Results:   Additional Information SSN:[id number]  Valentina Shaggy Jonnie Truxillo, LCSW

## 2023-08-18 NOTE — TOC Transition Note (Signed)
 Transition of Care [facility]) - Discharge Note   Patient Details  Name: Amber [name] MRN: [id number] Date of Birth: Oct 08, 1940  [facility] CM/SW Contact:  Larrie Kass, LCSW Phone Number: 08/18/2023, 4:17 PM   Clinical Narrative:     CSW spoke with Precious from [facility]. She is requesting the FL2 and d/c summary for review. The FL2 and d/c summary were faxed to the facility. CSW spoke with the facility, and it was confirmed that the pt can be discharged back. Pt's son will provide transportation back. Home oxygen will need to be arranged. [facility] contracts with [employer], and they will need walking sats and orders for a portable oxygen tank to be delivered to the room.   Final next level of care: Assisted Living Barriers to Discharge: Continued Medical Work up   Patient Goals and CMS Choice Patient states their goals for this hospitalization and ongoing recovery are:: return to ALF          Discharge Placement                    Patient and family notified of of transfer: 08/18/23  Discharge Plan and Services Additional resources added to the After Visit Summary for     Discharge Planning Services: CM Consult            DME Arranged: N/A DME Agency: NA       HH Arranged: NA HH Agency: NA        Social Drivers of Health (SDOH) Interventions SDOH Screenings   Food Insecurity: No Food Insecurity (08/15/2023)  Housing: Low Risk  (08/15/2023)  Transportation Needs: No Transportation Needs (08/15/2023)  Utilities: Not At Risk (08/15/2023)  Depression (PHQ2-9): Low Risk  (09/06/2022)  Financial Resource Strain: Low Risk  (11/08/2021)  Physical Activity: Inactive (11/08/2021)  Social Connections: Moderately Integrated (08/15/2023)  Stress: No Stress Concern Present (11/08/2021)  Tobacco Use: Medium Risk (08/14/2023)     Readmission Risk Interventions     No data to display

## 2023-08-18 NOTE — Plan of Care (Addendum)
 VSS. Patient c/o back pain, given PRN Tylenol, Oxycodone, and aquathermia placed. LBM 3/22. Patient ambulating within room using walker with standby assist. No acute events overnight.  Problem: Education: Goal: Knowledge of General Education information will improve Description: Including pain rating scale, medication(s)/side effects and non-pharmacologic comfort measures Outcome: Progressing   Problem: Clinical Measurements: Goal: Ability to maintain clinical measurements within normal limits will improve Outcome: Progressing Goal: Will remain free from infection Outcome: Progressing Goal: Respiratory complications will improve Outcome: Progressing Goal: Cardiovascular complication will be avoided Outcome: Progressing   Problem: Activity: Goal: Risk for activity intolerance will decrease Outcome: Progressing   Problem: Pain Managment: Goal: General experience of comfort will improve and/or be controlled Outcome: Progressing   Problem: Safety: Goal: Ability to remain free from injury will improve Outcome: Progressing

## 2023-08-18 NOTE — Discharge Summary (Signed)
 Physician Discharge Summary   Patient: Amber [name] MRN: [id number] DOB: 1940/08/14  Admit date:     08/14/2023  Discharge date: 08/18/23  Discharge Physician: Meredeth Ide   PCP: [facility]   Recommendations at discharge:   Follow-up pulmonology in 2 months Check LFTs in 1 week at PCP office  Discharge Diagnoses: Principal Problem:   Acute respiratory failure with hypoxia (HCC) Active Problems:   Essential hypertension   Acquired hypothyroidism   Mixed hyperlipidemia   Coronary artery disease   Abnormal LFTs   Lumbar radiculopathy   Hypomagnesemia   Chronic systolic CHF (congestive heart failure) (HCC)  Resolved Problems:   * No resolved hospital problems. *  Hospital Course: 83 yo with hx CAD, heart failure, PAD and multiple other medical issues here with progressive SOB with exertion.     Assessment and Plan:  Dyspnea on Exertion -Unclear etiology, CT chest concerning for atypical infection versus pulmonary edema -Patient was given Lasix with very minimal improvement CT chest with diffuse bilateral interlobular septal thickening and patchy ground glass opacities (edema vs infection)  echo -> EF 60-65%, no RWMA, grade 1 diastolic dysfunction, moderately elevated PASP Hold abx for now, lower suspicion for this with subacute presentation over period of 4-5 weeks -Pulmonology was consulted -Recommend follow-up as outpatient in 1 to 2 months, she will need high-resolution outpatient CT scan to assess lungs as outpatient. Patient will require 2 L/min of oxygen as O2 sats was 71% on room air on ambulation  Acute on chronic diastolic heart failure -Echocardiogram shows grade 1 diastolic dysfunction, EF 60 to 65% -No RWMA -Will discharge on Lasix 20 mg daily   Chest heaviness -Resolved; echo shows no RWMA -Symptoms going on for past many weeks -?  GERD, patient had negative cardiac workup as outpatient -Recently seen by cardiology on 3/14 -She has  known structural CAD with previous cardiac cath showing mild disease   Elevated LFT's Steatosis on [place] Mild, possibly volume related, improved with diuresis Statin has been restarted -Check LFTs in 1 week at PCP office    Weight Loss 20 lbs over past year per her report (per our records, weight 79.4 kg in April 2024.  74.4 kg now). Needs additional w/u outpatient  RD c/s   Hypothyroidism Synthroid   Hypertension Amlodipine, Metoprolol Will discontinue hydrochlorothiazide and losartan -Started on low-dose Lasix as above     Chronic Pain  Lumbar Radiculopathy Oxycodone prn, tylenol prn  Gabapentin   Overactive Bladder Oxybutynin   GERD PPI  CAD Aspirin Metoprolol Continue statin         Consultants:  Procedures performed:  Disposition: Assisted living Diet recommendation:  Discharge Diet Orders (From admission, onward)     Start     Ordered   08/18/23 0000  Diet - low sodium heart healthy        08/18/23 1528           Cardiac diet DISCHARGE MEDICATION: Allergies as of 08/18/2023       Reactions   Sulfa Antibiotics Rash, Other (See Comments)   "Like a sunburn"   Codeine Nausea And Vomiting   Darvocet [propoxyphene N-acetaminophen] Nausea And Vomiting   Nickel Rash   Other Rash   Dermabond   Vicodin [hydrocodone-acetaminophen] Other (See Comments)   GI upset        Medication List     STOP taking these medications    losartan-hydrochlorothiazide 100-12.5 MG tablet Commonly known as: HYZAAR  TAKE these medications    acetaminophen 500 MG tablet Commonly known as: TYLENOL Take 1,000 mg by mouth 3 (three) times daily.   albuterol 108 (90 Base) MCG/ACT inhaler Commonly known as: VENTOLIN HFA Inhale 2 puffs into the lungs every 4 (four) hours as needed for wheezing.   amLODipine 5 MG tablet Commonly known as: NORVASC Take 5 mg by mouth daily.   aspirin EC 81 MG tablet Take 1 tablet (81 mg total) by mouth daily. Swallow  whole.   atorvastatin 20 MG tablet Commonly known as: LIPITOR Take 20 mg by mouth at bedtime.   AZO CRANBERRY PO Take 1 tablet by mouth daily.   calcium carbonate 750 MG chewable tablet Commonly known as: TUMS EX Chew 1 tablet by mouth every 4 (four) hours as needed for heartburn (or indigestion).   clobetasol ointment 0.05 % Commonly known as: TEMOVATE Apply 1 Application topically daily as needed (for eczema, in the evening).   cyclobenzaprine 10 MG tablet Commonly known as: FLEXERIL Take 1 tablet (10 mg total) by mouth 3 (three) times daily as needed for muscle spasms. What changed: when to take this   diclofenac Sodium 1 % Gel Commonly known as: VOLTAREN Apply 2 g topically 3 (three) times daily as needed (joint pain).   furosemide 20 MG tablet Commonly known as: Lasix Take 1 tablet (20 mg total) by mouth daily.   gabapentin 100 MG capsule Commonly known as: NEURONTIN TAKE 1 CAPSULE(100 MG) BY MOUTH THREE TIMES DAILY What changed: See the new instructions.   levothyroxine 50 MCG tablet Commonly known as: SYNTHROID Take 1 tablet (50 mcg total) by mouth daily before breakfast.   metoprolol tartrate 25 MG tablet Commonly known as: LOPRESSOR Take 1 tablet (25 mg total) by mouth 2 (two) times daily. What changed:  when to take this additional instructions   Mucus Relief 600 MG 12 hr tablet Generic drug: guaiFENesin Take 600 mg by mouth 2 (two) times daily.   nitrofurantoin (macrocrystal-monohydrate) 100 MG capsule Commonly known as: MACROBID Take 100 mg by mouth at bedtime.   nitroGLYCERIN 0.4 MG SL tablet Commonly known as: NITROSTAT Place 1 tablet (0.4 mg total) under the tongue every 5 (five) minutes as needed for chest pain. What changed:  when to take this reasons to take this   nystatin powder Commonly known as: MYCOSTATIN/NYSTOP Apply 1 Application topically daily as needed (for rash/redness).   ondansetron 4 MG tablet Commonly known as:  ZOFRAN Take 1 tablet (4 mg total) by mouth every 6 (six) hours as needed for nausea. What changed: reasons to take this   OVER THE COUNTER MEDICATION Place 1 drop into both eyes See admin instructions. Refresh Classic Eye Drops- Instill 1 drop into both eyes in the morning and at bedtime   oxybutynin 5 MG 24 hr tablet Commonly known as: DITROPAN-XL Take 5 mg by mouth at bedtime.   oxyCODONE 5 MG immediate release tablet Commonly known as: Oxy IR/ROXICODONE Take 1 tablet (5 mg total) by mouth every 6 (six) hours as needed for severe pain. What changed:  when to take this additional instructions   pantoprazole 20 MG tablet Commonly known as: PROTONIX Take 20 mg by mouth 2 (two) times daily.   GoodSense ClearLax 17 GM/SCOOP powder Generic drug: polyethylene glycol powder Take 17 g by mouth daily.   polyethylene glycol 17 g packet Commonly known as: MIRALAX / GLYCOLAX Take 17 g by mouth daily.   potassium chloride SA 20 MEQ tablet Commonly known  asJerene Dilling Take 20 mEq by mouth daily.   PRESERVISION AREDS PO Take 1 capsule by mouth 2 (two) times daily.   Refresh Tears 0.5 % Soln Generic drug: carboxymethylcellulose Place 2 drops into both eyes 4 (four) times daily as needed (foir redness or irritation).   senna-docusate 8.6-50 MG tablet Commonly known as: Senokot-S Take 1 tablet by mouth at bedtime.   traMADol 50 MG tablet Commonly known as: ULTRAM Take 50 mg by mouth every 12 (twelve) hours as needed (for pain).   Vitamin D3 25 MCG (1000 UT) Caps Take 1,000 Units by mouth daily.   Vitamin-B Complex Tabs Take 1 tablet by mouth daily.        Discharge Exam: Filed Weights   08/14/23 1221  Weight: 74.4 kg   General-appears in no acute distress Heart-S1-S2, regular, no murmur auscultated Lungs-clear to auscultation bilaterally, no wheezing or crackles auscultated Abdomen-soft, nontender, no organomegaly Extremities-no edema in the lower  extremities Neuro-alert, oriented x3, no focal deficit noted  Condition at discharge: good  The results of significant diagnostics from this hospitalization (including imaging, microbiology, ancillary and laboratory) are listed below for reference.   Imaging Studies: ECHOCARDIOGRAM COMPLETE Result Date: 08/15/2023    ECHOCARDIOGRAM REPORT   Patient Name:   ZOUA CAPORASO Date of Exam: 08/15/2023 Medical Rec #:  [id number]     Height:       60.0 in Accession #:    [id number]    Weight:       164.0 lb Date of Birth:  08-26-1940     BSA:          1.716 m Patient Age:    83 years      BP:           150/89 mmHg Patient Gender: F             HR:           84 bpm. Exam Location:  Inpatient Procedure: 2D Echo, Cardiac Doppler, Color Doppler and Intracardiac            Opacification Agent (Both Spectral and Color Flow Doppler were            utilized during procedure). Indications:    Dyspnea  History:        Patient has prior history of Echocardiogram examinations, most                 recent 04/08/2022. Risk Factors:Hypertension, Dyslipidemia and                 Former Smoker.  Sonographer:    Karma Ganja Referring Phys: [id number] ANASTASSIA DOUTOVA IMPRESSIONS  1. Left ventricular ejection fraction, by estimation, is 60 to 65%. The left ventricle has normal function. The left ventricle has no regional wall motion abnormalities. Left ventricular diastolic parameters are consistent with Grade I diastolic dysfunction (impaired relaxation). Elevated left ventricular end-diastolic pressure.  2. Right ventricular systolic function is normal. The right ventricular size is normal. There is moderately elevated pulmonary artery systolic pressure.  3. Left atrial size was moderately dilated.  4. The mitral valve is normal in structure. Trivial mitral valve regurgitation. No evidence of mitral stenosis.  5. The aortic valve is tricuspid. Aortic valve regurgitation is not visualized. No aortic stenosis is present.  6. The inferior  vena cava is dilated in size with >50% respiratory variability, suggesting right atrial pressure of 8 mmHg. FINDINGS  Left Ventricle: Left ventricular ejection fraction, by estimation,  is 60 to 65%. The left ventricle has normal function. The left ventricle has no regional wall motion abnormalities. Definity contrast agent was given IV to delineate the left ventricular  endocardial borders. The left ventricular internal cavity size was normal in size. There is no left ventricular hypertrophy. Left ventricular diastolic parameters are consistent with Grade I diastolic dysfunction (impaired relaxation). Elevated left ventricular end-diastolic pressure. Right Ventricle: The right ventricular size is normal. No increase in right ventricular wall thickness. Right ventricular systolic function is normal. There is moderately elevated pulmonary artery systolic pressure. The tricuspid regurgitant velocity is 3.12 m/s, and with an assumed right atrial pressure of 8 mmHg, the estimated right ventricular systolic pressure is 46.9 mmHg. Left Atrium: Left atrial size was moderately dilated. Right Atrium: Right atrial size was normal in size. Pericardium: There is no evidence of pericardial effusion. Mitral Valve: The mitral valve is normal in structure. Mild mitral annular calcification. Trivial mitral valve regurgitation. No evidence of mitral valve stenosis. MV peak gradient, 6.4 mmHg. The mean mitral valve gradient is 4.0 mmHg. Tricuspid Valve: The tricuspid valve is normal in structure. Tricuspid valve regurgitation is mild . No evidence of tricuspid stenosis. Aortic Valve: The aortic valve is tricuspid. Aortic valve regurgitation is not visualized. No aortic stenosis is present. Aortic valve mean gradient measures 5.0 mmHg. Aortic valve peak gradient measures 8.6 mmHg. Aortic valve area, by VTI measures 2.07 cm. Pulmonic Valve: The pulmonic valve was normal in structure. Pulmonic valve regurgitation is not visualized. No  evidence of pulmonic stenosis. Aorta: The aortic root is normal in size and structure. Venous: The inferior vena cava is dilated in size with greater than 50% respiratory variability, suggesting right atrial pressure of 8 mmHg. IAS/Shunts: No atrial level shunt detected by color flow Doppler.  LEFT VENTRICLE PLAX 2D LVIDd:         5.20 cm      Diastology LVIDs:         3.60 cm      LV e' medial:    5.11 cm/s LV PW:         1.00 cm      LV E/e' medial:  22.1 LV IVS:        0.80 cm      LV e' lateral:   8.16 cm/s LVOT diam:     1.90 cm      LV E/e' lateral: 13.8 LV SV:         56 LV SV Index:   33 LVOT Area:     2.84 cm  LV Volumes (MOD) LV vol d, MOD A2C: 100.0 ml LV vol d, MOD A4C: 90.6 ml LV vol s, MOD A2C: 37.5 ml LV vol s, MOD A4C: 34.2 ml LV SV MOD A2C:     62.5 ml LV SV MOD A4C:     90.6 ml LV SV MOD BP:      60.5 ml RIGHT VENTRICLE             IVC RV Basal diam:  3.40 cm     IVC diam: 2.10 cm RV S prime:     11.20 cm/s TAPSE (M-mode): 2.9 cm LEFT ATRIUM             Index        RIGHT ATRIUM           Index LA diam:        3.50 cm 2.04 cm/m   RA Area:  17.30 cm LA Vol (A2C):   51.2 ml 29.84 ml/m  RA Volume:   48.60 ml  28.33 ml/m LA Vol (A4C):   67.3 ml 39.23 ml/m LA Biplane Vol: 59.9 ml 34.91 ml/m  AORTIC VALVE AV Area (Vmax):    2.08 cm AV Area (Vmean):   2.15 cm AV Area (VTI):     2.07 cm AV Vmax:           147.00 cm/s AV Vmean:          99.100 cm/s AV VTI:            0.272 m AV Peak Grad:      8.6 mmHg AV Mean Grad:      5.0 mmHg LVOT Vmax:         108.00 cm/s LVOT Vmean:        75.000 cm/s LVOT VTI:          0.199 m LVOT/AV VTI ratio: 0.73  AORTA Ao Root diam: 2.90 cm MITRAL VALVE                TRICUSPID VALVE MV Area (PHT): 3.77 cm     TR Peak grad:   38.9 mmHg MV Area VTI:   1.64 cm     TR Vmax:        312.00 cm/s MV Peak grad:  6.4 mmHg MV Mean grad:  4.0 mmHg     SHUNTS MV Vmax:       1.26 m/s     Systemic VTI:  0.20 m MV Vmean:      96.5 cm/s    Systemic Diam: 1.90 cm MV Decel Time:  201 msec MV E velocity: 113.00 cm/s MV A velocity: 121.00 cm/s MV E/A ratio:  0.93 Chilton Si MD Electronically signed by Chilton Si MD Signature Date/Time: 08/15/2023/4:29:24 PM    Final    US Abdomen Limited RUQ (LIVER/GB) Result Date: 08/15/2023 CLINICAL DATA:  Elevated LFTs EXAM: ULTRASOUND ABDOMEN LIMITED RIGHT UPPER QUADRANT COMPARISON:  CT 03/01/2023 FINDINGS: Gallbladder: No gallstones or wall thickening visualized. No sonographic Murphy sign noted by sonographer. Common bile duct: Diameter: Normal caliber, 2 mm Liver: Increased echotexture compatible with fatty infiltration. No focal abnormality or biliary ductal dilatation. Portal vein is patent on color Doppler imaging with normal direction of blood flow towards the liver. Other: None. IMPRESSION: Hepatic steatosis. No acute findings. Electronically Signed   By: Charlett Nose M.D.   On: 08/15/2023 00:24   CT Angio Chest PE W and/or Wo Contrast Result Date: 08/14/2023 CLINICAL DATA:  Shortness of breath. Positive D-dimer. PE suspected EXAM: CT ANGIOGRAPHY CHEST WITH CONTRAST TECHNIQUE: Multidetector CT imaging of the chest was performed using the standard protocol during bolus administration of intravenous contrast. Multiplanar CT image reconstructions and MIPs were obtained to evaluate the vascular anatomy. RADIATION DOSE REDUCTION: This exam was performed according to the departmental dose-optimization program which includes automated exposure control, adjustment of the mA and/or kV according to patient size and/or use of iterative reconstruction technique. CONTRAST:  75mL OMNIPAQUE IOHEXOL 350 MG/ML SOLN COMPARISON:  Radiograph 08/14/2023 and CTA chest 01/18/2023 FINDINGS: Cardiovascular: No pericardial effusion. Normal caliber aorta without dissection. Aortic and coronary artery atherosclerotic calcification. Negative for acute pulmonary embolism. Mediastinum/Nodes: Small hiatal hernia. Trachea is unremarkable. Subcentimeter mediastinal  and hilar lymph nodes are likely reactive. Lungs/Pleura: Diffuse bilateral interlobular septal thickening and patchy ground-glass opacities. No pleural effusion or pneumothorax. Upper Abdomen: No acute abnormality. Musculoskeletal: No acute fracture. Review of the MIP  images confirms the above findings. IMPRESSION: 1. Negative for acute pulmonary embolism. 2. Diffuse bilateral interlobular septal thickening and patchy ground-glass opacities may be due to pulmonary edema or atypical infection 3. Aortic Atherosclerosis (ICD10-I70.0). Electronically Signed   By: Minerva Fester M.D.   On: 08/14/2023 19:26   VAS [place] LOWER EXTREMITY VENOUS (DVT) (7a-7p) Result Date: 08/14/2023  Lower Venous DVT Study Patient Name:  KATERYN MARASIGAN  Date of Exam:   08/14/2023 Medical Rec #: [id number]      Accession #:    [id number] Date of Birth: 1940-08-20      Patient Gender: F Patient Age:   54 years Exam Location:  [facility] Procedure:      VAS [place] LOWER EXTREMITY VENOUS (DVT) Referring Phys: JON KNAPP --------------------------------------------------------------------------------  Indications: Swelling.  Risk Factors: None identified. Comparison Study: No prior studies. Performing Technologist: Chanda Busing RVT  Examination Guidelines: A complete evaluation includes B-mode imaging, spectral Doppler, color Doppler, and power Doppler as needed of all accessible portions of each vessel. Bilateral testing is considered an integral part of a complete examination. Limited examinations for reoccurring indications may be performed as noted. The reflux portion of the exam is performed with the patient in reverse Trendelenburg.  +-----+---------------+---------+-----------+----------+--------------+ RIGHTCompressibilityPhasicitySpontaneityPropertiesThrombus Aging +-----+---------------+---------+-----------+----------+--------------+ CFV  Full           Yes      Yes                                  +-----+---------------+---------+-----------+----------+--------------+   +---------+---------------+---------+-----------+----------+--------------+ LEFT     CompressibilityPhasicitySpontaneityPropertiesThrombus Aging +---------+---------------+---------+-----------+----------+--------------+ CFV      Full           Yes      Yes                                 +---------+---------------+---------+-----------+----------+--------------+ SFJ      Full                                                        +---------+---------------+---------+-----------+----------+--------------+ FV Prox  Full                                                        +---------+---------------+---------+-----------+----------+--------------+ FV Mid   Full                                                        +---------+---------------+---------+-----------+----------+--------------+ FV DistalFull                                                        +---------+---------------+---------+-----------+----------+--------------+ PFV      Full                                                        +---------+---------------+---------+-----------+----------+--------------+  POP      Full           Yes      Yes                                 +---------+---------------+---------+-----------+----------+--------------+ PTV      Full                                                        +---------+---------------+---------+-----------+----------+--------------+ PERO     Full                                                        +---------+---------------+---------+-----------+----------+--------------+    Summary: RIGHT: - No evidence of common femoral vein obstruction.   LEFT: - There is no evidence of deep vein thrombosis in the lower extremity.  - No cystic structure found in the popliteal fossa.  *See table(s) above for measurements and observations. Electronically signed  by Lemar Livings MD on 08/14/2023 at 3:44:36 PM.    Final    DG Chest Port 1 View Result Date: 08/14/2023 CLINICAL DATA:  Dyspnea. EXAM: PORTABLE CHEST 1 VIEW COMPARISON:  January 18, 2023. FINDINGS: The heart size and mediastinal contours are within normal limits. Both lungs are clear. The visualized skeletal structures are unremarkable. IMPRESSION: No active disease. Electronically Signed   By: Lupita Raider M.D.   On: 08/14/2023 14:56    Microbiology: Results for orders placed or performed during the hospital encounter of 08/14/23  Resp panel by RT-PCR (RSV, Flu A&B, Covid) Urine, Clean Catch     Status: None   Collection Time: 08/14/23 11:06 PM   Specimen: Urine, Clean Catch; Nasal Swab  Result Value Ref Range Status   SARS Coronavirus 2 by RT PCR NEGATIVE NEGATIVE Final    Comment: (NOTE) SARS-CoV-2 target nucleic acids are NOT DETECTED.  The SARS-CoV-2 RNA is generally detectable in upper respiratory specimens during the acute phase of infection. The lowest concentration of SARS-CoV-2 viral copies this assay can detect is 138 copies/mL. A negative result does not preclude SARS-Cov-2 infection and should not be used as the sole basis for treatment or other patient management decisions. A negative result may occur with  improper specimen collection/handling, submission of specimen other than nasopharyngeal swab, presence of viral mutation(s) within the areas targeted by this assay, and inadequate number of viral copies(<138 copies/mL). A negative result must be combined with clinical observations, patient history, and epidemiological information. The expected result is Negative.  Fact Sheet for Patients:  BloggerCourse.com  Fact Sheet for Healthcare Providers:  SeriousBroker.it  This test is no t yet approved or cleared by the [place] FDA and  has been authorized for detection and/or diagnosis of SARS-CoV-2 by FDA under an  Emergency Use Authorization (EUA). This EUA will remain  in effect (meaning this test can be used) for the duration of the COVID-19 declaration under Section 564(b)(1) of the Act, 21 U.S.C.section 360bbb-3(b)(1), unless the authorization is terminated  or revoked sooner.       Influenza A by PCR NEGATIVE NEGATIVE Final  Influenza B by PCR NEGATIVE NEGATIVE Final    Comment: (NOTE) The Xpert Xpress SARS-CoV-2/FLU/RSV plus assay is intended as an aid in the diagnosis of influenza from Nasopharyngeal swab specimens and should not be used as a sole basis for treatment. Nasal washings and aspirates are unacceptable for Xpert Xpress SARS-CoV-2/FLU/RSV testing.  Fact Sheet for Patients: BloggerCourse.com  Fact Sheet for Healthcare Providers: SeriousBroker.it  This test is not yet approved or cleared by the [place] FDA and has been authorized for detection and/or diagnosis of SARS-CoV-2 by FDA under an Emergency Use Authorization (EUA). This EUA will remain in effect (meaning this test can be used) for the duration of the COVID-19 declaration under Section 564(b)(1) of the Act, 21 U.S.C. section 360bbb-3(b)(1), unless the authorization is terminated or revoked.     Resp Syncytial Virus by PCR NEGATIVE NEGATIVE Final    Comment: (NOTE) Fact Sheet for Patients: BloggerCourse.com  Fact Sheet for Healthcare Providers: SeriousBroker.it  This test is not yet approved or cleared by the [place] FDA and has been authorized for detection and/or diagnosis of SARS-CoV-2 by FDA under an Emergency Use Authorization (EUA). This EUA will remain in effect (meaning this test can be used) for the duration of the COVID-19 declaration under Section 564(b)(1) of the Act, 21 U.S.C. section 360bbb-3(b)(1), unless the authorization is terminated or revoked.  Performed at [facility], [address], [place], [place] [address]     Labs: CBC: Recent Labs  Lab 08/14/23 1218 08/15/23 0606 08/16/23 0551 08/17/23 0530  WBC 10.7* 9.8 8.4 9.4  NEUTROABS  --   --  4.4 6.2  HGB 12.0 13.0 11.8* 11.9*  HCT 35.6* 38.2 34.6* 34.2*  MCV 94.4 93.4 94.5 92.7  PLT 155 171 152 137*   Basic Metabolic Panel: Recent Labs  Lab 08/14/23 1218 08/14/23 2003 08/15/23 0606 08/16/23 0551 08/17/23 0530  NA 132*  --  134* 132* 133*  K 4.0  --  3.8 4.0 3.6  CL 99  --  99 97* 98  CO2 24  --  25 25 27   GLUCOSE 92  --  94 96 90  BUN 12  --  12 17 18   CREATININE 0.66  --  0.71 0.60 0.70  CALCIUM 9.2  --  9.3 9.0 8.9  MG  --  1.6* 2.2 1.8 1.7  PHOS  --  4.0 4.4 4.7* 4.0   Liver Function Tests: Recent Labs  Lab 08/14/23 1218 08/15/23 0606 08/16/23 0551 08/17/23 0530  AST 129* 134* 78* 79*  ALT 92* 99* 71* 66*  ALKPHOS 119 128* 109 111  BILITOT 1.5* 1.4* 1.0 1.2  PROT 7.2 7.5 6.8 6.7  ALBUMIN 3.4* 3.4* 3.1* 3.2*   CBG: No results for input(s): "GLUCAP" in the last 168 hours.  Discharge time spent: greater than 30 minutes.  Signed: Meredeth Ide, MD [facility] 08/18/2023

## 2023-08-18 NOTE — Progress Notes (Signed)
 [facility]  PROGRESS NOTE  ALEXANDER MCAULEY ZOX:[id number] DOB: 05/25/1941 DOA: 08/14/2023 PCP: [facility]   Brief HPI:   83 yo with hx CAD, heart failure, PAD and multiple other medical issues here with progressive SOB with exertion.       Assessment/Plan:   Dyspnea on Exertion -Unclear etiology, CT chest concerning for atypical infection versus pulmonary edema -Patient was given Lasix with very minimal improvement CT chest with diffuse bilateral interlobular septal thickening and patchy ground glass opacities (edema vs infection)  echo -> EF 60-65%, no RWMA, grade 1 diastolic dysfunction, moderately elevated PASP Hold abx for now, lower suspicion for this with subacute presentation over period of 4-5 weeks -Pulmonology was consulted -Recommend follow-up as outpatient in 1 to 2 months, she will need high-resolution outpatient CT scan to assess lungs as outpatient. Will check pulse oximetry on ambulation to assess the need for oxygen on discharge  Chest heaviness -Symptoms going on for past many weeks -?  GERD, patient had negative cardiac workup as outpatient -Recently seen by cardiology on 3/14 -She has known structural CAD with previous cardiac cath showing mild disease  Elevated LFT's Steatosis on [place] Mild, possibly volume related, improving with diuressis Holding statin for now   Weight Loss 20 lbs over past year per her report (per our records, weight 79.4 kg in April 2024.  74.4 kg now). Needs additional w/u outpatient  RD c/s   Hypothyroidism Synthroid   Hypertension Amlodipine, Metoprolol Hydrochlorothiazide on hold with diuresis with loop diuretic above.  Losartan on hold.  Consider holding losartan at discharge if BP' continues to be soft    Chronic Pain  Lumbar Radiculopathy Oxycodone prn, tylenol prn  Gabapentin   Overactive Bladder Oxybutynin   GERD PPI  CAD Aspirin Metoprolol Holding statin     Medications      amLODipine  5 mg Oral Daily   aspirin EC  81 mg Oral Daily   gabapentin  100 mg Oral TID   levothyroxine  50 mcg Oral Q0600   metoprolol tartrate  25 mg Oral BID   nitrofurantoin (macrocrystal-monohydrate)  100 mg Oral QHS   oxybutynin  5 mg Oral QHS   pantoprazole  40 mg Oral Daily   Ensure Max Protein  11 oz Oral Daily   sodium chloride flush  3 mL Intravenous Q12H     Data Reviewed:   CBG:  No results for input(s): "GLUCAP" in the last 168 hours.  SpO2: 100 % O2 Flow Rate (L/min): 2 L/min    Vitals:   08/17/23 2146 08/18/23 0444 08/18/23 0957 08/18/23 1218  BP: (!) 147/64 (!) 161/80  (!) 119/50  Pulse: 91 90  74  Resp:  18  17  Temp:  98.5 F (36.9 C)  98 F (36.7 C)  TempSrc:      SpO2:  93% 99% 100%  Weight:      Height:          Data Reviewed:  Basic Metabolic Panel: Recent Labs  Lab 08/14/23 1218 08/14/23 2003 08/15/23 0606 08/16/23 0551 08/17/23 0530  NA 132*  --  134* 132* 133*  K 4.0  --  3.8 4.0 3.6  CL 99  --  99 97* 98  CO2 24  --  25 25 27   GLUCOSE 92  --  94 96 90  BUN 12  --  12 17 18   CREATININE 0.66  --  0.71 0.60 0.70  CALCIUM 9.2  --  9.3  9.0 8.9  MG  --  1.6* 2.2 1.8 1.7  PHOS  --  4.0 4.4 4.7* 4.0    CBC: Recent Labs  Lab 08/14/23 1218 08/15/23 0606 08/16/23 0551 08/17/23 0530  WBC 10.7* 9.8 8.4 9.4  NEUTROABS  --   --  4.4 6.2  HGB 12.0 13.0 11.8* 11.9*  HCT 35.6* 38.2 34.6* 34.2*  MCV 94.4 93.4 94.5 92.7  PLT 155 171 152 137*    LFT Recent Labs  Lab 08/14/23 1218 08/15/23 0606 08/16/23 0551 08/17/23 0530  AST 129* 134* 78* 79*  ALT 92* 99* 71* 66*  ALKPHOS 119 128* 109 111  BILITOT 1.5* 1.4* 1.0 1.2  PROT 7.2 7.5 6.8 6.7  ALBUMIN 3.4* 3.4* 3.1* 3.2*     Antibiotics: Anti-infectives (From admission, onward)    Start     Dose/Rate Route Frequency Ordered Stop   08/15/23 2200  nitrofurantoin (macrocrystal-monohydrate) (MACROBID) capsule 100 mg        100 mg Oral Daily at bedtime 08/15/23 1813           DVT prophylaxis: SCDs  Code Status: DNR  Family Communication: No family at bedside   CONSULTS    Subjective   Denies shortness of breath.  Objective    Physical Examination:   General-appears in no acute distress Heart-S1-S2, regular, no murmur auscultated Lungs-clear to auscultation bilaterally, no wheezing or crackles auscultated Abdomen-soft, nontender, no organomegaly Extremities-no edema in the lower extremities Neuro-alert, oriented x3, no focal deficit noted  Status is: Inpatient:             Meredeth Ide   [facility] If 7PM-7AM, please contact night-coverage at [email or web address], Office  [phone]   08/18/2023, 1:57 PM  LOS: 4 days

## 2023-08-18 NOTE — Progress Notes (Signed)
 Mobility Specialist - Progress Note   08/18/23 0957  Oxygen Therapy  SpO2 99 %  O2 Device Nasal Cannula  O2 Flow Rate (L/min) 2 L/min  Patient Activity (if Appropriate) Ambulating  Mobility  Activity Ambulated with assistance in hallway  Level of Assistance Standby assist, set-up cues, supervision of patient - no hands on  Assistive Device Front wheel walker  Distance Ambulated (ft) 275 ft  Activity Response Tolerated well  Mobility Referral Yes  Mobility visit 1 Mobility  Mobility Specialist Start Time (ACUTE ONLY) 0941  Mobility Specialist Stop Time (ACUTE ONLY) 0956  Mobility Specialist Time Calculation (min) (ACUTE ONLY) 15 min   Pt received in recliner and agreeable to mobility. No complaints during session. SOB throughout session but still eager to ambulate. Pt to recliner after session with all needs met.    Pre-mobility: 99 HR, 95% SpO2 (2L [place]) During mobility: 110 HR, 99% SpO2 (2L [place]) Post-mobility: 88 HR, 99% SPO2 (2L [place])  Chief Technology Officer

## 2023-08-18 NOTE — Plan of Care (Signed)

## 2023-08-18 NOTE — Progress Notes (Signed)
 SATURATION QUALIFICATIONS: (This note is used to comply with regulatory documentation for home oxygen)  Patient Saturations on Room Air at Rest = 94%  Patient Saturations on Room Air while Ambulating = 71%  Patient Saturations on 2 Liters of oxygen while Ambulating = 99%  Please briefly explain why patient needs home oxygen:

## 2023-08-21 LAB — CYCLIC CITRUL PEPTIDE ANTIBODY, IGG/IGA: CCP Antibodies IgG/IgA: 5 U (ref 0–19)

## 2023-08-21 NOTE — Telephone Encounter (Signed)
 Lm x for pt.  Mychart message sent.   Will call once more due to nature of call.

## 2023-08-21 NOTE — Telephone Encounter (Signed)
 Pt is scheduled to see Dr Isaiah Serge on 10-04-2023. Nfn

## 2023-09-11 ENCOUNTER — Other Ambulatory Visit (HOSPITAL_COMMUNITY)

## 2023-10-03 NOTE — Progress Notes (Unsigned)
 Cardiology Office Note:  .   Date:  10/04/2023  ID:  Amber [name], DOB 1941-04-21, MRN [id number] PCP: [name] Providers Cardiologist:  Amber Angel, MD     History of Present Illness: .   Amber [name] is a 83 y.o. female with PMH of nonobstructive CAD on cath 11/23, HFmrEF, PAD, carotid artery disease, LBBB, chronic venous insufficiency, hypertension, hyperlipidemia and prediabetes.  She was admitted to the hospital in November 2023 with altered mental status, decreased p.o. intake and was found to be hypotensive.  She was found to have E. coli bacteremia.  A CT scan consistent with pyelonephritis, incidental finding of atherosclerotic calcification of the aorta and branch vessels.  She was treated with antibiotic.  Hospital course complicated by AKI and elevated lactic acid.  Echocardiogram at the time showed EF borderline low at 45 to 50%, no regional wall motion abnormality, normal RV function, no significant valve disease.  During the hospitalization, she had elevation of the troponin.  She subsequently underwent cardiac catheterization on 04/10/2022 that showed 20% mid LAD, 30% mid left circumflex artery, 20% mid to distal left circumflex artery disease, 30% proximal RCA lesion, LVEDP 24 mmHg, medical therapy was recommended.  She was treated with IV diuretic.  I saw the patient for posthospital follow-up in December 2023.  She presented in the ED in August 2024 with chest pain and shortness of breath and found to be hypertensive with a systolic blood pressure of 200.  Troponin negative x 2.  Potassium 2.7.  CTA was negative for PE despite bilateral calf pain.  She was seen in the ED again in September 2024 with complaint of headache and hypertension.  Manual blood pressure 204/122.  CTA of the head and neck negative for large vessel occlusion or other emergent finding.  She left without being seen by ED physician.   I last saw the patient on 08/10/2023 at  which time she had crackles in the right base of the lung.  She comes in increasing shortness of breath for 6 weeks.  She appears to be euvolemic on exam.  I recommend a UA, CBC, basic metabolic panel, BNP, D-dimer and TSH.  I also recommend echocardiogram.  D-dimer came back elevated at 12.5.  CBC showed white blood cell count of 16.69.  She was subsequently admitted to the hospital in March.  CT angiogram with PE protocol of the chest showed no PE, however diffuse bilateral interlobar septal thickening and patchy groundglass opacity, this could represent either edema versus infection.  She was treated with a trial of Lasix .  Echocardiogram obtained during the hospitalization on 08/15/2023 showed EF 60 to 65%, grade 1 DD, elevated LVEDP, normal RV, trivial MR.  She was seen by pulmonology service who recommended high-resolution outpatient CT to reassess the lung in 1 to 2 months.  She was discharged on 20 mg daily of Lasix .  Patient presents today for cardiology follow-up.  She has been doing well since the recent hospital discharge.  She denies any chest pain.  Her breathing has improved.  She can walk from the parking lot all the way up to the fifth floor of heart and vascular building before she gets short of breath.  She has a follow-up with pulmonology service later today.  On physical exam, and no longer hear any crackles in the right base of the lung.  Her lung sounds clear on exam.  I reviewed the echocardiogram with the patient.  Given the fact that she was placed on 20 mg load of Lasix , I will obtain a basic metabolic panel to make sure her renal function and electrolyte are good.  Otherwise, she is stable to follow-up with Amber [name] in 4 to 6 months.  ROS:   She denies chest pain, palpitations, dyspnea, pnd, orthopnea, n, v, dizziness, syncope, edema, weight gain, or early satiety. All other systems reviewed and are otherwise negative except as noted above.    Studies Reviewed: .         Cardiac Studies & Procedures   ______________________________________________________________________________________________ CARDIAC CATHETERIZATION  CARDIAC CATHETERIZATION 04/10/2022  Conclusion   Mid LAD lesion is 20% stenosed.   Mid Cx lesion is 30% stenosed.   Mid Cx to Dist Cx lesion is 20% stenosed.   Prox RCA-1 lesion is 30% stenosed.   Prox RCA-2 lesion is 20% stenosed.   Mid RCA lesion is 20% stenosed.  Mild nonobstructive CAD with a percent mid LAD stenosis; 30% AV groove circumflex stenosis; and 20 to 30% RCA stenosis in a dominant vessel.  There is moderate mitral annular calcification.  Elevated LVEDP at 24 mm.  RECOMMENDATION: Aspirin  81 mg.  Medical therapy for CAD.  Findings Coronary Findings Diagnostic  Dominance: Right  Left Anterior Descending Mid LAD lesion is 20% stenosed.  Left Circumflex Mid Cx lesion is 30% stenosed. Mid Cx to Dist Cx lesion is 20% stenosed.  Right Coronary Artery Prox RCA-1 lesion is 30% stenosed. Prox RCA-2 lesion is 20% stenosed. Mid RCA lesion is 20% stenosed.  Intervention  No interventions have been documented.     ECHOCARDIOGRAM  ECHOCARDIOGRAM COMPLETE 08/15/2023  Narrative ECHOCARDIOGRAM REPORT    Patient Name:   Amber [name] Date of Exam: 08/15/2023 Medical Rec #:  [id number]     Height:       60.0 in Accession #:    [id number]    Weight:       164.0 lb Date of Birth:  1941-03-15     BSA:          1.716 m Patient Age:    83 years      BP:           150/89 mmHg Patient Gender: F             HR:           84 bpm. Exam Location:  Inpatient  Procedure: 2D Echo, Cardiac Doppler, Color Doppler and Intracardiac Opacification Agent (Both Spectral and Color Flow Doppler were utilized during procedure).  Indications:    Dyspnea  History:        Patient has prior history of Echocardiogram examinations, most recent 04/08/2022. Risk Factors:Hypertension, Dyslipidemia and Former Smoker.  Sonographer:     Amber [name] Referring Phys: [id number] Amber [name]  IMPRESSIONS   1. Left ventricular ejection fraction, by estimation, is 60 to 65%. The left ventricle has normal function. The left ventricle has no regional wall motion abnormalities. Left ventricular diastolic parameters are consistent with Grade I diastolic dysfunction (impaired relaxation). Elevated left ventricular end-diastolic pressure. 2. Right ventricular systolic function is normal. The right ventricular size is normal. There is moderately elevated pulmonary artery systolic pressure. 3. Left atrial size was moderately dilated. 4. The mitral valve is normal in structure. Trivial mitral valve regurgitation. No evidence of mitral stenosis. 5. The aortic valve is tricuspid. Aortic valve regurgitation is not visualized. No aortic stenosis is present. 6. The inferior vena cava is dilated in size with >  50% respiratory variability, suggesting right atrial pressure of 8 mmHg.  FINDINGS Left Ventricle: Left ventricular ejection fraction, by estimation, is 60 to 65%. The left ventricle has normal function. The left ventricle has no regional wall motion abnormalities. Definity  contrast agent was given IV to delineate the left ventricular endocardial borders. The left ventricular internal cavity size was normal in size. There is no left ventricular hypertrophy. Left ventricular diastolic parameters are consistent with Grade I diastolic dysfunction (impaired relaxation). Elevated left ventricular end-diastolic pressure.  Right Ventricle: The right ventricular size is normal. No increase in right ventricular wall thickness. Right ventricular systolic function is normal. There is moderately elevated pulmonary artery systolic pressure. The tricuspid regurgitant velocity is 3.12 m/s, and with an assumed right atrial pressure of 8 mmHg, the estimated right ventricular systolic pressure is 46.9 mmHg.  Left Atrium: Left atrial size was moderately  dilated.  Right Atrium: Right atrial size was normal in size.  Pericardium: There is no evidence of pericardial effusion.  Mitral Valve: The mitral valve is normal in structure. Mild mitral annular calcification. Trivial mitral valve regurgitation. No evidence of mitral valve stenosis. MV peak gradient, 6.4 mmHg. The mean mitral valve gradient is 4.0 mmHg.  Tricuspid Valve: The tricuspid valve is normal in structure. Tricuspid valve regurgitation is mild . No evidence of tricuspid stenosis.  Aortic Valve: The aortic valve is tricuspid. Aortic valve regurgitation is not visualized. No aortic stenosis is present. Aortic valve mean gradient measures 5.0 mmHg. Aortic valve peak gradient measures 8.6 mmHg. Aortic valve area, by VTI measures 2.07 cm.  Pulmonic Valve: The pulmonic valve was normal in structure. Pulmonic valve regurgitation is not visualized. No evidence of pulmonic stenosis.  Aorta: The aortic root is normal in size and structure.  Venous: The inferior vena cava is dilated in size with greater than 50% respiratory variability, suggesting right atrial pressure of 8 mmHg.  IAS/Shunts: No atrial level shunt detected by color flow Doppler.   LEFT VENTRICLE PLAX 2D LVIDd:         5.20 cm      Diastology LVIDs:         3.60 cm      LV e' medial:    5.11 cm/s LV PW:         1.00 cm      LV E/e' medial:  22.1 LV IVS:        0.80 cm      LV e' lateral:   8.16 cm/s LVOT diam:     1.90 cm      LV E/e' lateral: 13.8 LV SV:         56 LV SV Index:   33 LVOT Area:     2.84 cm  LV Volumes (MOD) LV vol d, MOD A2C: 100.0 ml LV vol d, MOD A4C: 90.6 ml LV vol s, MOD A2C: 37.5 ml LV vol s, MOD A4C: 34.2 ml LV SV MOD A2C:     62.5 ml LV SV MOD A4C:     90.6 ml LV SV MOD BP:      60.5 ml  RIGHT VENTRICLE             IVC RV Basal diam:  3.40 cm     IVC diam: 2.10 cm RV S prime:     11.20 cm/s TAPSE (M-mode): 2.9 cm  LEFT ATRIUM             Index        RIGHT  ATRIUM            Index LA diam:        3.50 cm 2.04 cm/m   RA Area:     17.30 cm LA Vol (A2C):   51.2 ml 29.84 ml/m  RA Volume:   48.60 ml  28.33 ml/m LA Vol (A4C):   67.3 ml 39.23 ml/m LA Biplane Vol: 59.9 ml 34.91 ml/m AORTIC VALVE AV Area (Vmax):    2.08 cm AV Area (Vmean):   2.15 cm AV Area (VTI):     2.07 cm AV Vmax:           147.00 cm/s AV Vmean:          99.100 cm/s AV VTI:            0.272 m AV Peak Grad:      8.6 mmHg AV Mean Grad:      5.0 mmHg LVOT Vmax:         108.00 cm/s LVOT Vmean:        75.000 cm/s LVOT VTI:          0.199 m LVOT/AV VTI ratio: 0.73  AORTA Ao Root diam: 2.90 cm  MITRAL VALVE                TRICUSPID VALVE MV Area (PHT): 3.77 cm     TR Peak grad:   38.9 mmHg MV Area VTI:   1.64 cm     TR Vmax:        312.00 cm/s MV Peak grad:  6.4 mmHg MV Mean grad:  4.0 mmHg     SHUNTS MV Vmax:       1.26 m/s     Systemic VTI:  0.20 m MV Vmean:      96.5 cm/s    Systemic Diam: 1.90 cm MV Decel Time: 201 msec MV E velocity: 113.00 cm/s MV A velocity: 121.00 cm/s MV E/A ratio:  0.93  Maudine Sos MD Electronically signed by Maudine Sos MD Signature Date/Time: 08/15/2023/4:29:24 PM    Final          ______________________________________________________________________________________________      Risk Assessment/Calculations:             Physical Exam:   VS:  BP 124/60   Pulse 68   Ht 5' (1.524 m)   Wt 164 lb 6.4 oz (74.6 kg)   LMP  (LMP Unknown)   SpO2 96%   BMI 32.11 kg/m    Wt Readings from Last 3 Encounters:  10/04/23 164 lb 6.4 oz (74.6 kg)  08/14/23 164 lb (74.4 kg)  08/10/23 164 lb (74.4 kg)    GEN: Well nourished, well developed in no acute distress NECK: No JVD; No carotid bruits CARDIAC: RRR, no murmurs, rubs, gallops RESPIRATORY:  Clear to auscultation without rales, wheezing or rhonchi  ABDOMEN: Soft, non-tender, non-distended EXTREMITIES:  No edema; No deformity   ASSESSMENT AND PLAN: .    HFmrEF: Patient had a  history of low EF of 45 to 50% in 2023.  Most recent echocardiogram showed normalization of ejection fraction to 60 to 65%.  She appears to be euvolemic on exam.  She was placed on low-dose 20 mg Lasix  during the recent hospitalization.  I will obtain basic metabolic panel  CAD: Minimal CAD noted on previous cardiac catheterization in 2023.  The patient denies any chest pain  Hypertension: Blood pressure well-controlled  Hyperlipidemia: On atorvastatin .       Dispo: Follow-up with  Amber [name] in 4 to 6 months.  Signed, Ervin Heath, PA

## 2023-10-04 ENCOUNTER — Encounter: Payer: Self-pay | Admitting: Physician Assistant

## 2023-10-04 ENCOUNTER — Ambulatory Visit: Admitting: Pulmonary Disease

## 2023-10-04 ENCOUNTER — Ambulatory Visit: Attending: Physician Assistant | Admitting: Physician Assistant

## 2023-10-04 ENCOUNTER — Encounter: Payer: Self-pay | Admitting: Pulmonary Disease

## 2023-10-04 VITALS — BP 128/64 | HR 71 | Ht 60.5 in | Wt 163.0 lb

## 2023-10-04 VITALS — BP 124/60 | HR 68 | Ht 60.0 in | Wt 164.4 lb

## 2023-10-04 DIAGNOSIS — Z79899 Other long term (current) drug therapy: Secondary | ICD-10-CM | POA: Diagnosis present

## 2023-10-04 DIAGNOSIS — N390 Urinary tract infection, site not specified: Secondary | ICD-10-CM | POA: Diagnosis not present

## 2023-10-04 DIAGNOSIS — I2510 Atherosclerotic heart disease of native coronary artery without angina pectoris: Secondary | ICD-10-CM | POA: Diagnosis present

## 2023-10-04 DIAGNOSIS — I10 Essential (primary) hypertension: Secondary | ICD-10-CM | POA: Diagnosis present

## 2023-10-04 DIAGNOSIS — J9600 Acute respiratory failure, unspecified whether with hypoxia or hypercapnia: Secondary | ICD-10-CM

## 2023-10-04 DIAGNOSIS — Z87891 Personal history of nicotine dependence: Secondary | ICD-10-CM

## 2023-10-04 DIAGNOSIS — E785 Hyperlipidemia, unspecified: Secondary | ICD-10-CM | POA: Insufficient documentation

## 2023-10-04 DIAGNOSIS — I5022 Chronic systolic (congestive) heart failure: Secondary | ICD-10-CM | POA: Insufficient documentation

## 2023-10-04 NOTE — Patient Instructions (Signed)
 Medication Instructions:  NO CHANGES *If you need a refill on your cardiac medications before your next appointment, please call your pharmacy*  Lab Work: BMET TODAY If you have labs (blood work) drawn today and your tests are completely normal, you will receive your results only by: MyChart Message (if you have MyChart) OR A paper copy in the mail If you have any lab test that is abnormal or we need to change your treatment, we will call you to review the results.  Testing/Procedures: NO TESTING  Follow-Up: At [facility], you and your health needs are our priority.  As part of our continuing mission to provide you with exceptional heart care, our providers are all part of one team.  This team includes your primary Cardiologist (physician) and Advanced Practice Providers or APPs (Physician Assistants and Nurse Practitioners) who all work together to provide you with the care you need, when you need it.  Your next appointment:   4-6 month(s)  Provider:   Alexandria Angel, MD

## 2023-10-04 NOTE — Patient Instructions (Signed)
 VISIT SUMMARY:  During your visit today, we discussed your recent improvement since your last hospital stay for acute respiratory failure related to heart failure and volume overload. We reviewed your current symptoms, including shortness of breath, chest heaviness, and fatigue, and considered the potential impact of your chronic urinary tract infection medication on your lung health.  YOUR PLAN:  -ACUTE RESPIRATORY FAILURE: Acute respiratory failure occurs when your lungs cannot provide enough oxygen to your body or remove enough carbon dioxide. Your condition has improved, but we need to reassess your lung health with a repeat chest CT scan in 2-3 months and a lung function test. We will also check if you still need oxygen therapy when walking.  -HEART FAILURE: Heart failure means your heart is not pumping blood as well as it should. Your heart function has improved with treatment, and your cardiologist will continue to monitor your condition.  -CHRONIC URINARY TRACT INFECTION: A chronic urinary tract infection is a long-term infection in your urinary system. You have been taking nitrofurantoin  (Macrobid ) to manage this since 2023. We will continue this medication but will reassess its use if your upcoming CT scan shows any lung changes.  INSTRUCTIONS:  Please schedule a chest CT scan in 2-3 months and a lung function test to reassess your lung condition. We will also evaluate your need for continued oxygen therapy during ambulation. Continue taking nitrofurantoin  for your chronic urinary tract infection, and we will reassess its use based on your CT scan results.

## 2023-10-04 NOTE — Progress Notes (Signed)
 Amber [name]    [id number]    [birth year]  Primary Care Physician:[place], [facility]  Referring Physician: [place], [facility] [address] [place],  [place] [address]  Chief complaint: Consult for interstitial lung disease  HPI: 83 y.o. 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  Discussed the use of AI scribe software for clinical note transcription with the patient, who gave verbal consent to proceed.  History of Present Illness Amber [name] is an 83 year old female with heart failure who presents with shortness of breath.  She has experienced significant improvement since her last hospitalization in March 2025 for acute respiratory failure, where she was treated for heart failure and volume overload. A CT scan at that time showed diffuse groundglass opacity, but no blood clots were found. Tests for autoimmune conditions like lupus and rheumatoid arthritis were negative. She was discharged with oxygen therapy, which she did not need for long. Her O2 sats has remained in the nineties, occasionally starting at ninety-one and then increasing.  She has a history of chronic urinary tract infections and has been on nitrofurantoin  (Macrobid ) since 2023 following an episode of sepsis. There is a concern that this medication might cause lung inflammation, although her CT scan findings are not typical for this.  She began experiencing symptoms after visiting a hair salon where perfumed sprays were used. She noticed difficulty walking without needing to rest and felt a heaviness on her chest, which she associates with fluid retention. She also  reports feeling tired all the time.   Pets: No pets Occupation: Retired Manufacturing systems engineer Exposures: No mold, hot tub, Jacuzzi.  No feather pillows or comforters Smoking history: Quit smoking at the age of 88 Travel history:She has lived in [place]  since 1979, originally from [place] .  No significant recent travel Relevant family history:Her father had black lung disease from working in the mines, but there is no other family history of lung disease.   Outpatient Encounter Medications as of 10/04/2023  Medication Sig   acetaminophen  (TYLENOL ) 500 MG tablet Take 1,000 mg by mouth 3 (three) times daily.   albuterol  (VENTOLIN  HFA) 108 (90 Base) MCG/ACT inhaler Inhale 2 puffs into the lungs every 4 (four) hours as needed for wheezing.   amLODipine  (NORVASC ) 5 MG tablet Take 5 mg by mouth daily.   aspirin  EC 81 MG tablet Take 1 tablet (81 mg total) by mouth daily. Swallow whole.   atorvastatin  (LIPITOR) 20 MG tablet Take 20 mg by mouth at bedtime.   AZO CRANBERRY GUMMIES 250 MG CHEW Chew 1 tablet by mouth daily.   B Complex Vitamins (VITAMIN-B COMPLEX) TABS Take 1 tablet by mouth daily.    calcium  carbonate (TUMS EX) 750 MG chewable tablet Chew 1 tablet by mouth every 4 (four) hours as needed for heartburn (or indigestion).   Cholecalciferol (VITAMIN D3) 1000 units CAPS Take 1,000 Units by mouth daily.    clobetasol  ointment (TEMOVATE ) 0.05 % Apply 1 Application topically daily as needed (for eczema, in the evening).   Cranberry-Vitamin C-Probiotic (AZO CRANBERRY PO) Take 1 tablet by mouth daily.   cyclobenzaprine  (FLEXERIL ) 10 MG tablet Take 1 tablet (10 mg total) by mouth 3 (three) times  daily as needed for muscle spasms. (Patient taking differently: Take 10 mg by mouth every 8 (eight) hours as needed for muscle spasms.)   diclofenac Sodium (VOLTAREN) 1 % GEL Apply 2 g topically 3 (three) times daily as needed (joint pain).   furosemide  (LASIX ) 20 MG tablet Take 1 tablet (20 mg  total) by mouth daily.   gabapentin  (NEURONTIN ) 100 MG capsule TAKE 1 CAPSULE(100 MG) BY MOUTH THREE TIMES DAILY (Patient taking differently: Take 100 mg by mouth 3 (three) times daily.)   levothyroxine  (SYNTHROID ) 50 MCG tablet Take 1 tablet (50 mcg total) by mouth daily before breakfast.   metoprolol  tartrate (LOPRESSOR ) 25 MG tablet Take 1 tablet (25 mg total) by mouth 2 (two) times daily.   MUCUS RELIEF 600 MG 12 hr tablet Take 600 mg by mouth 2 (two) times daily.   Multiple Vitamins-Minerals (PRESERVISION AREDS PO) Take 1 capsule by mouth 2 (two) times daily.   nitrofurantoin , macrocrystal-monohydrate, (MACROBID ) 100 MG capsule Take 100 mg by mouth at bedtime.   nitroGLYCERIN  (NITROSTAT ) 0.4 MG SL tablet Place 1 tablet (0.4 mg total) under the tongue every 5 (five) minutes as needed for chest pain. (Patient taking differently: Place 0.4 mg under the tongue every 5 (five) minutes x 3 doses as needed for chest pain (CALL 9-1-1, IF NO RELIEF).)   nystatin  (MYCOSTATIN /NYSTOP ) powder Apply 1 Application topically daily as needed (for rash/redness).   ondansetron  (ZOFRAN ) 4 MG tablet Take 1 tablet (4 mg total) by mouth every 6 (six) hours as needed for nausea. (Patient taking differently: Take 4 mg by mouth every 6 (six) hours as needed for nausea or vomiting.)   OVER THE COUNTER MEDICATION Place 1 drop into both eyes See admin instructions. Refresh Classic Eye Drops- Instill 1 drop into both eyes in the morning and at bedtime   oxyCODONE  (OXY IR/ROXICODONE ) 5 MG immediate release tablet Take 1 tablet (5 mg total) by mouth every 6 (six) hours as needed for severe pain. (Patient taking differently: Take 5 mg by mouth See admin instructions. Take 5 mg by mouth four times a day and hold for sedation)   pantoprazole  (PROTONIX ) 20 MG tablet Take 20 mg by mouth 2 (two) times daily.   polyethylene glycol (MIRALAX  / GLYCOLAX ) 17 g packet Take 17 g by mouth daily.   polyethylene glycol powder (GOODSENSE  CLEARLAX) 17 GM/SCOOP powder Take 17 g by mouth daily.   potassium chloride  SA (KLOR-CON  M) 20 MEQ tablet Take 20 mEq by mouth daily.   REFRESH TEARS 0.5 % SOLN Place 2 drops into both eyes 4 (four) times daily as needed (foir redness or irritation).   senna-docusate (SENOKOT-S) 8.6-50 MG tablet Take 1 tablet by mouth at bedtime.   traMADol  (ULTRAM ) 50 MG tablet Take 50 mg by mouth every 12 (twelve) hours as needed (for pain).   No facility-administered encounter medications on file as of 10/04/2023.   Physical Exam: Blood pressure 128/64, pulse 71, height 5' 0.5" (1.537 m), weight 163 lb (73.9 kg), SpO2 94%. Gen:      No acute distress HEENT:  EOMI, sclera anicteric Neck:     No masses; no thyromegaly Lungs:    Clear to auscultation bilaterally; normal respiratory effort CV:         Regular rate and rhythm; no murmurs Abd:      + bowel sounds; soft, non-tender; no palpable masses, no distension Ext:    No edema; adequate peripheral perfusion Skin:      Warm and  dry; no rash Neuro: alert and oriented x 3 Psych: normal mood and affect  Data Reviewed: Imaging: CT abdomen pelvis 03/12/2023-visualized lungs are clear CTA 08/14/2023-negative for PE, diffuse bilateral subpleural thickening, patchy groundglass opacities I have reviewed the images personally.  PFTs:   Labs: CTD serologies 08/18/2023-negative  Assessment & Plan Acute respiratory failure Acute respiratory failure was initially attributed to heart failure with volume overload, which has improved. Differential diagnosis includes lung issues such as autoimmune conditions, which have been ruled out, and potential medication-induced lung inflammation from nitrofurantoin  the CT scan findings are not typical of this.. The CT scan showed diffuse opacity, possibly due to fluid or viral infection. She has improved clinically, but further assessment is needed to confirm resolution and rule out other causes. There is a consideration that  nitrofurantoin  may cause lung inflammation, although the CT findings are not typical for this. The plan is to reassess the need for this medication based on future CT scan results.  - Order repeat chest CT scan in 2-3 months to reassess lung condition - Order lung function test to evaluate respiratory status - Discontinue oxygen as she did not desat and office today  Chronic urinary tract infection Chronic urinary tract infection is being managed with nitrofurantoin  (Macrobid ) since a sepsis episode in 2023. Further evaluation of nitrofurantoin 's impact on lung health will be conducted after the CT scan results.  - Reassess nitrofurantoin  use if CT scan suggests interstitial changes  Recommendations: Follow-up high-res CT, PFTs Discontinue supplemental oxygen  Depaul Arizpe MD  Pulmonary and Critical Care 10/04/2023, 10:47 AM  CC: [place], [facility]*

## 2023-10-05 LAB — BASIC METABOLIC PANEL WITH GFR
BUN/Creatinine Ratio: 17 (ref 12–28)
BUN: 13 mg/dL (ref 8–27)
CO2: 22 mmol/L (ref 20–29)
Calcium: 9.5 mg/dL (ref 8.7–10.3)
Chloride: 101 mmol/L (ref 96–106)
Creatinine, Ser: 0.77 mg/dL (ref 0.57–1.00)
Glucose: 97 mg/dL (ref 70–99)
Potassium: 4.4 mmol/L (ref 3.5–5.2)
Sodium: 139 mmol/L (ref 134–144)
eGFR: 76 mL/min/{1.73_m2} (ref >59)

## 2023-10-09 ENCOUNTER — Ambulatory Visit: Payer: Self-pay

## 2023-12-15 ENCOUNTER — Emergency Department (HOSPITAL_COMMUNITY)
Admission: EM | Admit: 2023-12-15 | Discharge: 2023-12-15 | Disposition: A | Discharge status: Home / Self Care | Attending: Emergency Medicine | Admitting: Emergency Medicine

## 2023-12-15 ENCOUNTER — Emergency Department (HOSPITAL_COMMUNITY)

## 2023-12-15 ENCOUNTER — Other Ambulatory Visit: Payer: Self-pay

## 2023-12-15 DIAGNOSIS — R1084 Generalized abdominal pain: Secondary | ICD-10-CM | POA: Insufficient documentation

## 2023-12-15 DIAGNOSIS — Z7982 Long term (current) use of aspirin: Secondary | ICD-10-CM | POA: Insufficient documentation

## 2023-12-15 DIAGNOSIS — R197 Diarrhea, unspecified: Secondary | ICD-10-CM

## 2023-12-15 LAB — COMPREHENSIVE METABOLIC PANEL WITH GFR
ALT: 14 U/L (ref 0–44)
AST: 23 U/L (ref 15–41)
Albumin: 3.4 g/dL — ABNORMAL LOW (ref 3.5–5.0)
Alkaline Phosphatase: 102 U/L (ref 38–126)
Anion gap: 12 (ref 5–15)
BUN: 5 mg/dL — ABNORMAL LOW (ref 8–23)
CO2: 25 mmol/L (ref 22–32)
Calcium: 9.3 mg/dL (ref 8.9–10.3)
Chloride: 103 mmol/L (ref 98–111)
Creatinine, Ser: 0.68 mg/dL (ref 0.44–1.00)
GFR, Estimated: >60 mL/min (ref >60)
Glucose, Bld: 97 mg/dL (ref 70–99)
Potassium: 3.2 mmol/L — ABNORMAL LOW (ref 3.5–5.1)
Sodium: 140 mmol/L (ref 135–145)
Total Bilirubin: 1.5 mg/dL — ABNORMAL HIGH (ref 0.0–1.2)
Total Protein: 6.5 g/dL (ref 6.5–8.1)

## 2023-12-15 LAB — CBC WITH DIFFERENTIAL/PLATELET
Abs Immature Granulocytes: 0.03 K/uL (ref 0.00–0.07)
Basophils Absolute: 0 K/uL (ref 0.0–0.1)
Basophils Relative: 0 %
Eosinophils Absolute: 0.1 K/uL (ref 0.0–0.5)
Eosinophils Relative: 1 %
HCT: 35.7 % — ABNORMAL LOW (ref 36.0–46.0)
Hemoglobin: 12.4 g/dL (ref 12.0–15.0)
Immature Granulocytes: 0 %
Lymphocytes Relative: 23 %
Lymphs Abs: 1.8 K/uL (ref 0.7–4.0)
MCH: 31.3 pg (ref 26.0–34.0)
MCHC: 34.7 g/dL (ref 30.0–36.0)
MCV: 90.2 fL (ref 80.0–100.0)
Monocytes Absolute: 0.5 K/uL (ref 0.1–1.0)
Monocytes Relative: 6 %
Neutro Abs: 5.4 K/uL (ref 1.7–7.7)
Neutrophils Relative %: 70 %
Platelets: 138 K/uL — ABNORMAL LOW (ref 150–400)
RBC: 3.96 MIL/uL (ref 3.87–5.11)
RDW: 15.1 % (ref 11.5–15.5)
WBC: 7.8 K/uL (ref 4.0–10.5)
nRBC: 0 % (ref 0.0–0.2)

## 2023-12-15 LAB — URINALYSIS, ROUTINE W REFLEX MICROSCOPIC
Bilirubin Urine: NEGATIVE
Glucose, UA: NEGATIVE mg/dL
Hgb urine dipstick: NEGATIVE
Ketones, ur: NEGATIVE mg/dL
Leukocytes,Ua: NEGATIVE
Nitrite: NEGATIVE
Protein, ur: NEGATIVE mg/dL
Specific Gravity, Urine: 1.003 — ABNORMAL LOW (ref 1.005–1.030)
pH: 5 (ref 5.0–8.0)

## 2023-12-15 LAB — LIPASE, BLOOD: Lipase: 24 U/L (ref 11–51)

## 2023-12-15 MED ORDER — IOHEXOL 350 MG/ML SOLN
75.0000 mL | Freq: Once | INTRAVENOUS | Status: AC | PRN
  Administered 2023-12-15: 75 mL via INTRAVENOUS

## 2023-12-15 MED ORDER — OXYCODONE-ACETAMINOPHEN 5-325 MG PO TABS
2.0000 | ORAL_TABLET | Freq: Once | ORAL | Status: AC
  Administered 2023-12-15: 2 via ORAL
  Filled 2023-12-15: qty 2

## 2023-12-15 MED ORDER — POTASSIUM CHLORIDE CRYS ER 20 MEQ PO TBCR
40.0000 meq | EXTENDED_RELEASE_TABLET | Freq: Once | ORAL | Status: AC
  Administered 2023-12-15: 40 meq via ORAL
  Filled 2023-12-15: qty 2

## 2023-12-15 NOTE — Discharge Instructions (Signed)
 I would trial taking Metamucil supplement for loose stool.  In addition I would discontinue laxative use until your bowel movements begin to be formed stools again.  You can always introduce laxatives back in if need to have a bowel movement.  Follow up with GI. Return to ER with new or worsening symptoms.

## 2023-12-15 NOTE — ED Notes (Signed)
 Patient narmally walks with a walker

## 2023-12-15 NOTE — ED Triage Notes (Signed)
 Pt bib [facility] from [facility] c/o constipation due to not having a solid bowel movement in 2 weeks. Pt recently had a scan that showed calcification in her colon per EMS. On arrival to ED pt immediately up to use the bathroom with her personal walker.

## 2023-12-15 NOTE — ED Provider Notes (Signed)
  Physical Exam  BP (!) 165/63   Pulse 73   Temp 97.7 F (36.5 C) (Oral)   Resp 18   LMP  (LMP Unknown)   SpO2 99%   Physical Exam Cardiovascular:     Rate and Rhythm: Normal rate and regular rhythm.  Pulmonary:     Effort: Pulmonary effort is normal.     Breath sounds: Normal breath sounds.  Abdominal:     General: Abdomen is flat.     Palpations: Abdomen is soft.  Neurological:     Mental Status: She is alert.     Procedures  Procedures  ED Course / MDM    Medical Decision Making Amount and/or Complexity of Data Reviewed Labs: ordered. Radiology: ordered.  Risk Prescription drug management.   Patient presents to emergency room with complaint of constipation.  Her potassium is 3.2 which will be supplemented orally.  CT scan shows no significant stool burden and no other acute findings.  When further speaking to patient she endorses 2 weeks of loose stools approximately 2 loose stools a day.  She notes she has been taking MiraLAX  daily, Colace 1-2 times a day, suppository and enema.  I feel this explains her clinical picture and likely findings on CT scan of liquid stool.  Encouraged her to stop using laxatives and trial fiber supplement.  She will follow-up with GI.  Given return precautions.  I feel she is stable for discharge at this time.       Shermon Warren LOISE DEVONNA 12/15/23 1655    Randol Simmonds, MD 12/16/23 1534

## 2023-12-15 NOTE — ED Notes (Signed)
 Patient transported to CT

## 2023-12-15 NOTE — ED Provider Notes (Signed)
 [facility] AT [facility] Provider Note   CSN: [id number] Arrival date & time: 12/15/23  1126     Patient presents with: No chief complaint on file.   Amber [name] is a 83 y.o. female.   Patient complains of constipation.  Patient reports that she is on daily MiraLAX  and Colace.  Patient reports that the facility where she resides gave her an enema on Thursday.  Patient reports she has had progressively worsening abdominal pain.  Patient complains of discomfort in the upper right abdominal area.  Patient denies any fever or chills she has not had any nausea or vomiting.  Patient states that after the enema she had just water  come out.  Patient reports recent increase to oxycodone .  Patient states that she has been on the oxycodone  for 15 years and has not previously had any problem with constipation from it.  The history is provided by the patient. No language interpreter was used.       Prior to Admission medications   Medication Sig Start Date End Date Taking? Authorizing Provider  acetaminophen  (TYLENOL ) 500 MG tablet Take 1,000 mg by mouth 3 (three) times daily.    [provider]  albuterol  (VENTOLIN  HFA) 108 (90 Base) MCG/ACT inhaler Inhale 2 puffs into the lungs every 4 (four) hours as needed for wheezing. 08/07/23   [provider]  amLODipine  (NORVASC ) 5 MG tablet Take 5 mg by mouth daily.    [provider]  aspirin  EC 81 MG tablet Take 1 tablet (81 mg total) by mouth daily. Swallow whole. 04/17/22   Sherrill Cable Latif, DO  atorvastatin  (LIPITOR) 20 MG tablet Take 20 mg by mouth at bedtime.    [provider]  AZO CRANBERRY GUMMIES 250 MG CHEW Chew 1 tablet by mouth daily. 07/30/23   [provider]  B Complex Vitamins (VITAMIN-B COMPLEX) TABS Take 1 tablet by mouth daily.     [provider]  calcium  carbonate (TUMS EX) 750 MG chewable tablet Chew 1 tablet by mouth every 4 (four) hours as  needed for heartburn (or indigestion).    [provider]  Cholecalciferol (VITAMIN D3) 1000 units CAPS Take 1,000 Units by mouth daily.     [provider]  clobetasol  ointment (TEMOVATE ) 0.05 % Apply 1 Application topically daily as needed (for eczema, in the evening).    [provider]  Cranberry-Vitamin C-Probiotic (AZO CRANBERRY PO) Take 1 tablet by mouth daily.    [provider]  cyclobenzaprine  (FLEXERIL ) 10 MG tablet Take 1 tablet (10 mg total) by mouth 3 (three) times daily as needed for muscle spasms. Patient taking differently: Take 10 mg by mouth every 8 (eight) hours as needed for muscle spasms. 11/11/20   Jodie Lavern CROME, MD  diclofenac Sodium (VOLTAREN) 1 % GEL Apply 2 g topically 3 (three) times daily as needed (joint pain). 11/14/22   [provider]  furosemide  (LASIX ) 20 MG tablet Take 1 tablet (20 mg total) by mouth daily. 08/18/23 08/17/24  Drusilla Sabas GORMAN, MD  gabapentin  (NEURONTIN ) 100 MG capsule TAKE 1 CAPSULE(100 MG) BY MOUTH THREE TIMES DAILY Patient taking differently: Take 100 mg by mouth 3 (three) times daily. 02/28/21   Jodie Lavern CROME, MD  levothyroxine  (SYNTHROID ) 50 MCG tablet Take 1 tablet (50 mcg total) by mouth daily before breakfast. 11/03/21   Jodie Lavern CROME, MD  metoprolol  tartrate (LOPRESSOR ) 25 MG tablet Take 1 tablet (25 mg total) by mouth 2 (  two) times daily. 04/16/22   Sheikh, Omair Latif, DO  MUCUS RELIEF 600 MG 12 hr tablet Take 600 mg by mouth 2 (two) times daily. 08/07/23   [provider]  Multiple Vitamins-Minerals (PRESERVISION AREDS PO) Take 1 capsule by mouth 2 (two) times daily.    [provider]  nitrofurantoin , macrocrystal-monohydrate, (MACROBID ) 100 MG capsule Take 100 mg by mouth at bedtime.    [provider]  nitroGLYCERIN  (NITROSTAT ) 0.4 MG SL tablet Place 1 tablet (0.4 mg total) under the tongue every 5 (five) minutes as needed for chest pain. Patient taking differently:  Place 0.4 mg under the tongue every 5 (five) minutes x 3 doses as needed for chest pain (CALL 9-1-1, IF NO RELIEF). 04/16/22   Sheikh, Omair Latif, DO  nystatin  (MYCOSTATIN /NYSTOP ) powder Apply 1 Application topically daily as needed (for rash/redness).    [provider]  ondansetron  (ZOFRAN ) 4 MG tablet Take 1 tablet (4 mg total) by mouth every 6 (six) hours as needed for nausea. Patient taking differently: Take 4 mg by mouth every 6 (six) hours as needed for nausea or vomiting. 04/16/22   Sheikh, Omair Latif, DO  OVER THE COUNTER MEDICATION Place 1 drop into both eyes See admin instructions. Refresh Classic Eye Drops- Instill 1 drop into both eyes in the morning and at bedtime    [provider]  oxyCODONE  (OXY IR/ROXICODONE ) 5 MG immediate release tablet Take 1 tablet (5 mg total) by mouth every 6 (six) hours as needed for severe pain. Patient taking differently: Take 5 mg by mouth See admin instructions. Take 5 mg by mouth four times a day and hold for sedation 04/16/22   Sheikh, Omair Latif, DO  pantoprazole  (PROTONIX ) 20 MG tablet Take 20 mg by mouth 2 (two) times daily. 02/07/23   [provider]  polyethylene glycol (MIRALAX  / GLYCOLAX ) 17 g packet Take 17 g by mouth daily. 04/16/22   Sheikh, Omair Latif, DO  polyethylene glycol powder (GOODSENSE CLEARLAX) 17 GM/SCOOP powder Take 17 g by mouth daily.    [provider]  potassium chloride  SA (KLOR-CON  M) 20 MEQ tablet Take 20 mEq by mouth daily. 03/20/23   [provider]  REFRESH TEARS 0.5 % SOLN Place 2 drops into both eyes 4 (four) times daily as needed (foir redness or irritation).    [provider]  senna-docusate (SENOKOT-S) 8.6-50 MG tablet Take 1 tablet by mouth at bedtime.    [provider]  traMADol  (ULTRAM ) 50 MG tablet Take 50 mg by mouth every 12 (twelve) hours as needed (for pain). 05/15/23   [provider]    Allergies: Sulfa antibiotics, Codeine,  Darvocet [propoxyphene n-acetaminophen ], Nickel, Other, and Vicodin [hydrocodone -acetaminophen ]    Review of Systems  Gastrointestinal:  Positive for abdominal pain and constipation.  All other systems reviewed and are negative.   Updated Vital Signs BP (!) 162/63 (BP Location: Right Arm)   Pulse 76   Temp 97.6 F (36.4 C) (Oral)   Resp 18   LMP  (LMP Unknown)   SpO2 100%   Physical Exam Vitals and nursing note reviewed.  Constitutional:      Appearance: She is well-developed.  HENT:     Head: Normocephalic.  Cardiovascular:     Rate and Rhythm: Normal rate.  Pulmonary:     Effort: Pulmonary effort is normal.  Abdominal:     General: There is no distension.     Palpations: Abdomen is soft.  Musculoskeletal:  General: Normal range of motion.     Cervical back: Normal range of motion.  Skin:    General: Skin is warm.  Neurological:     General: No focal deficit present.     Mental Status: She is alert and oriented to person, place, and time.  Psychiatric:        Mood and Affect: Mood normal.     (all labs ordered are listed, but only abnormal results are displayed) Labs Reviewed - No data to display  EKG: None  Radiology: No results found.   Procedures   Medications Ordered in the ED - No data to display                                  Medical Decision Making Pt complains of constipation unrelieved by enemas MiraLAX  and Colace.  Amount and/or Complexity of Data Reviewed Labs: ordered. Decision-making details documented in ED Course.    Details: Labs ordered reviewed and interpreted UA is negative CBC no acute findings Radiology: ordered.    Details: CT abdomen and pelvis ordered  Risk Prescription drug management. Risk Details: Patient requesting pain medication.  Patient takes Percocet 4 times a day.  Patient's care turned over to [facility], PA-C.  CT scan is pending.        Final diagnoses:  Generalized abdominal pain     ED Discharge Orders     None          Machel Violante K, PA-C 12/15/23 1527    Patsey Lot, MD 12/16/23 [phone]

## 2023-12-15 NOTE — ED Notes (Signed)
 CCMD called.

## 2023-12-28 ENCOUNTER — Ambulatory Visit
Admission: RE | Admit: 2023-12-28 | Discharge: 2023-12-28 | Disposition: A | Discharge status: Home / Self Care | Source: Ambulatory Visit | Attending: Pulmonary Disease | Admitting: Pulmonary Disease

## 2023-12-28 DIAGNOSIS — J9600 Acute respiratory failure, unspecified whether with hypoxia or hypercapnia: Secondary | ICD-10-CM

## 2024-01-08 ENCOUNTER — Ambulatory Visit: Admitting: Pulmonary Disease

## 2024-01-08 ENCOUNTER — Encounter: Payer: Self-pay | Admitting: Pulmonary Disease

## 2024-01-08 VITALS — BP 125/71 | HR 73 | Temp 98.0°F | Ht 60.5 in | Wt 161.0 lb

## 2024-01-08 DIAGNOSIS — Z8679 Personal history of other diseases of the circulatory system: Secondary | ICD-10-CM

## 2024-01-08 DIAGNOSIS — J4489 Other specified chronic obstructive pulmonary disease: Secondary | ICD-10-CM

## 2024-01-08 DIAGNOSIS — J9600 Acute respiratory failure, unspecified whether with hypoxia or hypercapnia: Secondary | ICD-10-CM

## 2024-01-08 DIAGNOSIS — R918 Other nonspecific abnormal finding of lung field: Secondary | ICD-10-CM

## 2024-01-08 DIAGNOSIS — I272 Pulmonary hypertension, unspecified: Secondary | ICD-10-CM

## 2024-01-08 DIAGNOSIS — Z87440 Personal history of urinary (tract) infections: Secondary | ICD-10-CM

## 2024-01-08 DIAGNOSIS — J849 Interstitial pulmonary disease, unspecified: Secondary | ICD-10-CM

## 2024-01-08 LAB — PULMONARY FUNCTION TEST
DL/VA % pred: 65 %
DL/VA: 2.73 ml/min/mmHg/L
DLCO cor % pred: 50 %
DLCO cor: 8.23 ml/min/mmHg
DLCO unc % pred: 48 %
DLCO unc: 7.96 ml/min/mmHg
FEF 25-75 Post: 1.26 L/s
FEF 25-75 Pre: 1.12 L/s
FEF2575-%Change-Post: 12 %
FEF2575-%Pred-Post: 132 %
FEF2575-%Pred-Pre: 118 %
FEV1-%Change-Post: 2 %
FEV1-%Pred-Post: 112 %
FEV1-%Pred-Pre: 110 %
FEV1-Post: 1.62 L
FEV1-Pre: 1.59 L
FEV1FVC-%Change-Post: 3 %
FEV1FVC-%Pred-Pre: 101 %
FEV6-%Change-Post: -2 %
FEV6-%Pred-Post: 113 %
FEV6-%Pred-Pre: 115 %
FEV6-Post: 2.09 L
FEV6-Pre: 2.13 L
FEV6FVC-%Change-Post: 0 %
FEV6FVC-%Pred-Post: 107 %
FEV6FVC-%Pred-Pre: 106 %
FVC-%Change-Post: -1 %
FVC-%Pred-Post: 107 %
FVC-%Pred-Pre: 108 %
FVC-Post: 2.12 L
FVC-Pre: 2.15 L
Post FEV1/FVC ratio: 76 %
Post FEV6/FVC ratio: 100 %
Pre FEV1/FVC ratio: 74 %
Pre FEV6/FVC Ratio: 99 %
RV % pred: 52 %
RV: 1.21 L
TLC % pred: 76 %
TLC: 3.45 L

## 2024-01-08 MED ORDER — PREDNISONE 10 MG PO TABS: 10.0000 mg | ORAL_TABLET | Freq: Every day | ORAL | 0 refills | Status: DC

## 2024-01-08 MED ORDER — PREDNISONE 10 MG PO TABS: 10.0000 mg | ORAL_TABLET | Freq: Every day | ORAL | 0 refills | Status: AC

## 2024-01-08 NOTE — Progress Notes (Addendum)
 Amber [name]    [id number]    15-Aug-1940  Primary Care Physician:Everson, [facility]  Referring Physician: Zarek Relph, MD [address] [place],  [place] [address]  Chief complaint: Follow up for interstitial lung disease  HPI: 83 y.o. 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  Discussed the use of AI scribe software for clinical note transcription with the patient, who gave verbal consent to proceed.  History of Present Illness Amber [name] is an 83 year old female with heart failure who presents with shortness of breath.  Seen by pulmonary during hospitalization in March 2025 for acute respiratory failure, where she was treated for heart failure and volume overload. A CT scan at that time showed diffuse groundglass opacity, but no blood clots were found. Tests for autoimmune conditions like lupus and rheumatoid arthritis were negative. She was discharged with oxygen therapy, which she did not need for long. Her O2 sats has remained in the nineties, occasionally starting at ninety-one and then increasing.  She has a history of chronic urinary tract infections and has been on nitrofurantoin  (Macrobid ) since 2023 following an episode of sepsis. There is a concern that this medication might cause lung inflammation, although her CT scan findings are not typical for this.  Interim History Amber [name] is an 83 year old female with heart failure who presents with shortness of breath and chest heaviness. She is accompanied by her daughter.  Dyspnea and chest heaviness - Increased shortness of breath and sensation of heaviness on  the chest, particularly with deep inspiration - Describes chest heaviness as 'there's something heavy on my chest' - Symptoms have become more noticeable since moving to assisted living - Dyspnea and chest heaviness impact daily activities, including getting ready in the morning, resulting in significant fatigue ('wore out') - Shortness of breath varies with stress levels  Heart failure with reduced ejection fraction - History of heart failure with reduced ejection fraction (initially 40% during hospitalization in March) - Ejection fraction improved to 60% following treatment - Persistent shortness of breath despite improvement in ejection fraction - History of pulmonary crackles noted during hospital stay  Pulmonary evaluation - Recent CT scan performed - Recent lung function tests completed  Urinary symptoms and recurrent urinary tract infections - On nitrofurantoin  (Macrobid ) since 2003 or 2004 for recurrent urinary tract infections - Prior to nitrofurantoin , urinary tract infections were frequent and closely spaced - Continued dysuria described as urine feeling 'hot'  Relevant pulmonary history Pets: No pets Occupation: Retired Manufacturing systems engineer Exposures: No mold, hot tub, Jacuzzi.  No feather pillows or comforters Smoking history: Quit smoking at the age of 89 Travel history:She has lived in [place]  since 1979, originally from [place] .  No significant recent travel Relevant family history:Her father had black lung disease from working in the mines, but there is no other family history of lung disease.   Outpatient Encounter Medications as of 01/08/2024  Medication Sig   acetaminophen  (TYLENOL ) 500 MG tablet Take 1,000 mg by mouth 3 (three) times daily.   albuterol  (VENTOLIN  HFA) 108 (90 Base) MCG/ACT inhaler Inhale 2 puffs into the lungs every 4 (four) hours as needed for wheezing.  amLODipine  (NORVASC ) 5 MG tablet Take 5 mg by mouth daily.   aspirin  EC 81 MG  tablet Take 1 tablet (81 mg total) by mouth daily. Swallow whole.   atorvastatin  (LIPITOR) 20 MG tablet Take 20 mg by mouth at bedtime.   AZO CRANBERRY GUMMIES 250 MG CHEW Chew 1 tablet by mouth daily.   B Complex Vitamins (VITAMIN-B COMPLEX) TABS Take 1 tablet by mouth daily.    calcium  carbonate (TUMS EX) 750 MG chewable tablet Chew 1 tablet by mouth every 4 (four) hours as needed for heartburn (or indigestion).   Cholecalciferol (VITAMIN D3) 1000 units CAPS Take 1,000 Units by mouth daily.    clobetasol  ointment (TEMOVATE ) 0.05 % Apply 1 Application topically daily as needed (for eczema, in the evening).   Cranberry-Vitamin C-Probiotic (AZO CRANBERRY PO) Take 1 tablet by mouth daily.   cyclobenzaprine  (FLEXERIL ) 10 MG tablet Take 1 tablet (10 mg total) by mouth 3 (three) times daily as needed for muscle spasms. (Patient taking differently: Take 10 mg by mouth every 8 (eight) hours as needed for muscle spasms.)   diclofenac Sodium (VOLTAREN) 1 % GEL Apply 2 g topically 3 (three) times daily as needed (joint pain).   furosemide  (LASIX ) 20 MG tablet Take 1 tablet (20 mg total) by mouth daily.   gabapentin  (NEURONTIN ) 100 MG capsule TAKE 1 CAPSULE(100 MG) BY MOUTH THREE TIMES DAILY (Patient taking differently: Take 100 mg by mouth 3 (three) times daily.)   levothyroxine  (SYNTHROID ) 50 MCG tablet Take 1 tablet (50 mcg total) by mouth daily before breakfast.   metoprolol  tartrate (LOPRESSOR ) 25 MG tablet Take 1 tablet (25 mg total) by mouth 2 (two) times daily.   MUCUS RELIEF 600 MG 12 hr tablet Take 600 mg by mouth 2 (two) times daily.   Multiple Vitamins-Minerals (PRESERVISION AREDS PO) Take 1 capsule by mouth 2 (two) times daily.   nitrofurantoin , macrocrystal-monohydrate, (MACROBID ) 100 MG capsule Take 100 mg by mouth at bedtime.   nitroGLYCERIN  (NITROSTAT ) 0.4 MG SL tablet Place 1 tablet (0.4 mg total) under the tongue every 5 (five) minutes as needed for chest pain. (Patient taking differently:  Place 0.4 mg under the tongue every 5 (five) minutes x 3 doses as needed for chest pain (CALL 9-1-1, IF NO RELIEF).)   nystatin  (MYCOSTATIN /NYSTOP ) powder Apply 1 Application topically daily as needed (for rash/redness).   ondansetron  (ZOFRAN ) 4 MG tablet Take 1 tablet (4 mg total) by mouth every 6 (six) hours as needed for nausea. (Patient taking differently: Take 4 mg by mouth every 6 (six) hours as needed for nausea or vomiting.)   OVER THE COUNTER MEDICATION Place 1 drop into both eyes See admin instructions. Refresh Classic Eye Drops- Instill 1 drop into both eyes in the morning and at bedtime   oxyCODONE  (OXY IR/ROXICODONE ) 5 MG immediate release tablet Take 1 tablet (5 mg total) by mouth every 6 (six) hours as needed for severe pain. (Patient taking differently: Take 5 mg by mouth See admin instructions. Take 5 mg by mouth four times a day and hold for sedation)   pantoprazole  (PROTONIX ) 20 MG tablet Take 20 mg by mouth 2 (two) times daily.   polyethylene glycol (MIRALAX  / GLYCOLAX ) 17 g packet Take 17 g by mouth daily.   polyethylene glycol powder (GOODSENSE CLEARLAX) 17 GM/SCOOP powder Take 17 g by mouth daily.   potassium chloride  SA (KLOR-CON  M) 20 MEQ tablet Take 20 mEq by mouth daily.   REFRESH TEARS 0.5 % SOLN Place 2  drops into both eyes 4 (four) times daily as needed (foir redness or irritation).   senna-docusate (SENOKOT-S) 8.6-50 MG tablet Take 1 tablet by mouth at bedtime.   traMADol  (ULTRAM ) 50 MG tablet Take 50 mg by mouth every 12 (twelve) hours as needed (for pain).   No facility-administered encounter medications on file as of 01/08/2024.   Physical Exam: Blood pressure 128/64, pulse 71, height 5' 0.5 (1.537 m), weight 163 lb (73.9 kg), SpO2 94%. Gen:      No acute distress HEENT:  EOMI, sclera anicteric Neck:     No masses; no thyromegaly Lungs:    Clear to auscultation bilaterally; normal respiratory effort CV:         Regular rate and rhythm; no murmurs Abd:      +  bowel sounds; soft, non-tender; no palpable masses, no distension Ext:    No edema; adequate peripheral perfusion Skin:      Warm and dry; no rash Neuro: alert and oriented x 3 Psych: normal mood and affect  Data Reviewed: Imaging: CT abdomen pelvis 03/12/2023-visualized lungs are clear CTA 08/14/2023-negative for PE, diffuse bilateral subpleural thickening, patchy groundglass opacities High resolution CT 12/29/2023-diffuse ground glass with mild basilar scarring, lobular F trapping, alternate pattern.  Suspect hypersensitivity pneumonitis I have reviewed the images personally.  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 Differential diagnosis excludes autoimmune and connective tissue diseases though serologies are negative. Discussed nitrofurantoin 's potential side effects, including lung issues, and the possibility of switching antibiotics. Risks of continued nitrofurantoin  use include persistent inflammation and potential scarring. Benefits of discontinuation include potential lung function improvement. Medical decision making involves considering alternative antibiotics and prednisone  for inflammation reduction.  Do not use high-dose of prednisone  due to age and frailty.  Bronchoscopy not recommended as well.  - Prescribe prednisone  10 mg daily for 30 days. - Contact urologist Dr. Marykay to discuss  alternative antibiotics for UTI prophylaxis. - Consider stopping nitrofurantoin  after consultation with urologist. - Monitor lung function and symptoms closely.  Pulmonary hypertension History of heart failure with recovered ejection fraction Previously reduced ejection fraction now recovered to 60%. Heart function improved since March hospitalization. Continued monitoring necessary. Pulmonary hypertension present. Improvement in heart function and lung function may improve pulmonary hypertension. - Consider repeating echocardiogram later this year or next year.  Recurrent urinary tract infections Recurrent UTIs managed with long-term nitrofurantoin . No recent UTIs reported, but concerns about nitrofurantoin  causing lung issues. Discussed potential alternatives such as Bactrim, Cipro, or Keflex. Emphasized importance of preventing UTIs while addressing lung concerns. Collaboration with urologist needed to determine best alternative antibiotic. - Discuss alternative UTI prophylaxis with urologist Dr. Gaston - Consider switching to an alternative antibiotic after consultation with urologist.  Recommendations: Start low-dose prednisone  10 mg/day for 1 month Discussed with urology about alternate antibiotics for UTI prophylaxis  Duquan Gillooly MD [facility] 01/08/2024, 10:03 AM  CC: Jocelynne Duquette, MD  Addendum: I called and discussed with Dr. Gaston, Urology who advised that to stop the nitrofurantoin .  He will review the  chart and get back to the patient about alternate antibiotic therapy for prophylaxis.  I called and informed the son about the recommendation.  Orley Lawry MD [facility] See Amion for pager  If no response to pager , please call [phone] until 7pm After 7:00 pm call Elink  [phone] 01/08/2024, 11:13 AM

## 2024-01-08 NOTE — Progress Notes (Signed)
 Full PFT performed today.

## 2024-01-08 NOTE — Patient Instructions (Signed)
  VISIT SUMMARY: Today, we discussed your increased shortness of breath and chest heaviness, which have been affecting your daily activities. We also reviewed your history of heart failure, recent pulmonary evaluations, and urinary symptoms. We suspect that your long-term use of nitrofurantoin  for urinary tract infections may be causing lung inflammation.  YOUR PLAN: -CHRONIC LUNG INFLAMMATION (SUSPECTED NITROFURANTOIN -INDUCED INTERSTITIAL LUNG DISEASE): Chronic lung inflammation is a condition where the lungs are persistently inflamed, which can be caused by long-term use of certain medications like nitrofurantoin . We will start you on prednisone  10 mg daily for 30 days to reduce inflammation. We will also contact your urologist to discuss alternative antibiotics for your urinary tract infections and consider stopping nitrofurantoin  after consulting with them. We will monitor your lung function and symptoms closely.  -PULMONARY HYPERTENSION: Pulmonary hypertension is high blood pressure in the lungs' arteries. Improvement in your heart function may help improve this condition.  -HISTORY OF HEART FAILURE WITH RECOVERED EJECTION FRACTION: Heart failure with recovered ejection fraction means your heart's pumping ability has improved. Your ejection fraction has recovered to 60%, and we will continue to monitor your heart function. We may consider repeating an echocardiogram later this year or next year.  -RECURRENT URINARY TRACT INFECTIONS: Recurrent urinary tract infections are frequent infections of the urinary tract. We will discuss alternative antibiotics with your urologist to prevent UTIs while addressing your lung concerns. We may switch to a different antibiotic after consulting with your urologist.  INSTRUCTIONS: Please take prednisone  10 mg daily for 30 days as prescribed. We will contact your urologist, Dr. Marykay, to discuss alternative antibiotics for your urinary tract infections. We will  consider stopping nitrofurantoin  after consulting with your urologist. Monitor your symptoms and lung function closely, and we may repeat an echocardiogram later this year or next year to check your heart function.  Contains text generated by [employer].

## 2024-01-08 NOTE — Patient Instructions (Signed)
 PFT performed today.

## 2024-01-08 NOTE — Addendum Note (Signed)
 Addended by: ARMAND SOR R on: 01/08/2024 10:39 AM   Modules accepted: Orders

## 2024-02-11 ENCOUNTER — Emergency Department (HOSPITAL_COMMUNITY)
Admission: EM | Admit: 2024-02-11 | Discharge: 2024-02-11 | Disposition: A | Discharge status: Skilled Nursing Facility | Attending: Emergency Medicine | Admitting: Emergency Medicine

## 2024-02-11 ENCOUNTER — Encounter (HOSPITAL_COMMUNITY): Payer: Self-pay | Admitting: Emergency Medicine

## 2024-02-11 ENCOUNTER — Other Ambulatory Visit: Payer: Self-pay

## 2024-02-11 ENCOUNTER — Emergency Department (HOSPITAL_COMMUNITY)

## 2024-02-11 DIAGNOSIS — I2510 Atherosclerotic heart disease of native coronary artery without angina pectoris: Secondary | ICD-10-CM | POA: Diagnosis not present

## 2024-02-11 DIAGNOSIS — R1030 Lower abdominal pain, unspecified: Secondary | ICD-10-CM | POA: Insufficient documentation

## 2024-02-11 DIAGNOSIS — R300 Dysuria: Secondary | ICD-10-CM | POA: Diagnosis present

## 2024-02-11 DIAGNOSIS — Z79899 Other long term (current) drug therapy: Secondary | ICD-10-CM | POA: Diagnosis not present

## 2024-02-11 DIAGNOSIS — E039 Hypothyroidism, unspecified: Secondary | ICD-10-CM | POA: Insufficient documentation

## 2024-02-11 DIAGNOSIS — D72829 Elevated white blood cell count, unspecified: Secondary | ICD-10-CM | POA: Insufficient documentation

## 2024-02-11 DIAGNOSIS — I10 Essential (primary) hypertension: Secondary | ICD-10-CM | POA: Diagnosis not present

## 2024-02-11 DIAGNOSIS — Z7982 Long term (current) use of aspirin: Secondary | ICD-10-CM | POA: Insufficient documentation

## 2024-02-11 DIAGNOSIS — B379 Candidiasis, unspecified: Secondary | ICD-10-CM | POA: Insufficient documentation

## 2024-02-11 DIAGNOSIS — N390 Urinary tract infection, site not specified: Secondary | ICD-10-CM | POA: Diagnosis not present

## 2024-02-11 LAB — COMPREHENSIVE METABOLIC PANEL WITH GFR
ALT: 9 U/L (ref 0–44)
AST: 23 U/L (ref 15–41)
Albumin: 3.9 g/dL (ref 3.5–5.0)
Alkaline Phosphatase: 100 U/L (ref 38–126)
Anion gap: 13 (ref 5–15)
BUN: 13 mg/dL (ref 8–23)
CO2: 26 mmol/L (ref 22–32)
Calcium: 9.6 mg/dL (ref 8.9–10.3)
Chloride: 99 mmol/L (ref 98–111)
Creatinine, Ser: 0.72 mg/dL (ref 0.44–1.00)
GFR, Estimated: >60 mL/min (ref >60)
Glucose, Bld: 94 mg/dL (ref 70–99)
Potassium: 3.9 mmol/L (ref 3.5–5.1)
Sodium: 138 mmol/L (ref 135–145)
Total Bilirubin: 0.8 mg/dL (ref 0.0–1.2)
Total Protein: 6.7 g/dL (ref 6.5–8.1)

## 2024-02-11 LAB — CBC WITH DIFFERENTIAL/PLATELET
Abs Immature Granulocytes: 0.06 K/uL (ref 0.00–0.07)
Basophils Absolute: 0 K/uL (ref 0.0–0.1)
Basophils Relative: 0 %
Eosinophils Absolute: 0.1 K/uL (ref 0.0–0.5)
Eosinophils Relative: 1 %
HCT: 39.6 % (ref 36.0–46.0)
Hemoglobin: 12.8 g/dL (ref 12.0–15.0)
Immature Granulocytes: 1 %
Lymphocytes Relative: 22 %
Lymphs Abs: 2.7 K/uL (ref 0.7–4.0)
MCH: 31 pg (ref 26.0–34.0)
MCHC: 32.3 g/dL (ref 30.0–36.0)
MCV: 95.9 fL (ref 80.0–100.0)
Monocytes Absolute: 1.4 K/uL — ABNORMAL HIGH (ref 0.1–1.0)
Monocytes Relative: 12 %
Neutro Abs: 7.9 K/uL — ABNORMAL HIGH (ref 1.7–7.7)
Neutrophils Relative %: 64 %
Platelets: 159 K/uL (ref 150–400)
RBC: 4.13 MIL/uL (ref 3.87–5.11)
RDW: 15.3 % (ref 11.5–15.5)
WBC: 12.2 K/uL — ABNORMAL HIGH (ref 4.0–10.5)
nRBC: 0 % (ref 0.0–0.2)

## 2024-02-11 LAB — LIPASE, BLOOD: Lipase: 14 U/L (ref 11–51)

## 2024-02-11 LAB — URINALYSIS, ROUTINE W REFLEX MICROSCOPIC
Bacteria, UA: NONE SEEN
Bilirubin Urine: NEGATIVE
Glucose, UA: NEGATIVE mg/dL
Hgb urine dipstick: NEGATIVE
Ketones, ur: NEGATIVE mg/dL
Nitrite: NEGATIVE
Protein, ur: NEGATIVE mg/dL
Specific Gravity, Urine: 1.004 — ABNORMAL LOW (ref 1.005–1.030)
WBC, UA: >50 WBC/hpf (ref 0–5)
pH: 6 (ref 5.0–8.0)

## 2024-02-11 MED ORDER — OXYCODONE-ACETAMINOPHEN 5-325 MG PO TABS
1.0000 | ORAL_TABLET | Freq: Once | ORAL | Status: AC
Start: 2024-02-11 — End: 2024-02-11
  Administered 2024-02-11: 1 via ORAL
  Filled 2024-02-11: qty 1

## 2024-02-11 MED ORDER — IOHEXOL 300 MG/ML  SOLN
100.0000 mL | Freq: Once | INTRAMUSCULAR | Status: AC | PRN
  Administered 2024-02-11: 100 mL via INTRAVENOUS

## 2024-02-11 MED ORDER — SODIUM CHLORIDE (PF) 0.9 % IJ SOLN
INTRAMUSCULAR | Status: AC
  Filled 2024-02-11: qty 50

## 2024-02-11 MED ORDER — SODIUM CHLORIDE 0.9 % IV SOLN
2.0000 g | Freq: Once | INTRAVENOUS | Status: AC
  Administered 2024-02-11: 2 g via INTRAVENOUS
  Filled 2024-02-11: qty 20

## 2024-02-11 MED ORDER — FOSFOMYCIN TROMETHAMINE 3 G PO PACK
3.0000 g | PACK | Freq: Once | ORAL | Status: AC
  Administered 2024-02-11: 3 g via ORAL
  Filled 2024-02-11: qty 3

## 2024-02-11 NOTE — ED Notes (Signed)
 Called [facility] to inquire about the pt's dispo and way back to the facility.  Staff instructed me that I could call her son but if he was not available we would have to find a way to get her back.  Left voicemail for son at number listed in chart.

## 2024-02-11 NOTE — ED Provider Notes (Signed)
 [place] EMERGENCY DEPARTMENT AT [facility] Provider Note   CSN: [id number] Arrival date & time: 02/11/24  1156     Patient presents with: Dysuria   Amber [name] is a 83 y.o. female patient with past medical history of chronic venous insufficiency, coronary artery disease, frequent UTI, hypothyroidism, hypertension presents to emergency room with complaint of 2 weeks of dysuria, urinary frequency and urgency.  Has had suprapubic pain over the last 4 days as well.  Also has noticed over rash to lower abdomen which she reports is red and itching.  She denies any fever or chills. Has been using steroid cream on it.     Dysuria      Prior to Admission medications   Medication Sig Start Date End Date Taking? Authorizing Provider  acetaminophen  (TYLENOL ) 500 MG tablet Take 1,000 mg by mouth 3 (three) times daily.    [provider]  albuterol  (VENTOLIN  HFA) 108 (90 Base) MCG/ACT inhaler Inhale 2 puffs into the lungs every 4 (four) hours as needed for wheezing. 08/07/23   [provider]  amLODipine  (NORVASC ) 5 MG tablet Take 5 mg by mouth daily.    [provider]  aspirin  EC 81 MG tablet Take 1 tablet (81 mg total) by mouth daily. Swallow whole. 04/17/22   Sheikh, Omair Latif, DO  atorvastatin  (LIPITOR) 20 MG tablet Take 20 mg by mouth at bedtime.    [provider]  AZO CRANBERRY GUMMIES 250 MG CHEW Chew 1 tablet by mouth daily. 07/30/23   [provider]  B Complex Vitamins (VITAMIN-B COMPLEX) TABS Take 1 tablet by mouth daily.     [provider]  calcium  carbonate (TUMS EX) 750 MG chewable tablet Chew 1 tablet by mouth every 4 (four) hours as needed for heartburn (or indigestion).    [provider]  Cholecalciferol (VITAMIN D3) 1000 units CAPS Take 1,000 Units by mouth daily.     [provider]  clobetasol  ointment (TEMOVATE ) 0.05 % Apply 1 Application topically daily as needed (for eczema, in the  evening).    [provider]  Cranberry-Vitamin C-Probiotic (AZO CRANBERRY PO) Take 1 tablet by mouth daily.    [provider]  cyclobenzaprine  (FLEXERIL ) 10 MG tablet Take 1 tablet (10 mg total) by mouth 3 (three) times daily as needed for muscle spasms. Patient taking differently: Take 10 mg by mouth every 8 (eight) hours as needed for muscle spasms. 11/11/20   Jodie Lavern CROME, MD  diclofenac Sodium (VOLTAREN) 1 % GEL Apply 2 g topically 3 (three) times daily as needed (joint pain). 11/14/22   [provider]  furosemide  (LASIX ) 20 MG tablet Take 1 tablet (20 mg total) by mouth daily. 08/18/23 08/17/24  Drusilla Sabas GORMAN, MD  gabapentin  (NEURONTIN ) 100 MG capsule TAKE 1 CAPSULE(100 MG) BY MOUTH THREE TIMES DAILY Patient taking differently: Take 100 mg by mouth 3 (three) times daily. 02/28/21   Jodie Lavern CROME, MD  levothyroxine  (SYNTHROID ) 50 MCG tablet Take 1 tablet (50 mcg total) by mouth daily before breakfast. 11/03/21   Jodie Lavern CROME, MD  metoprolol  tartrate (LOPRESSOR ) 25 MG tablet Take 1 tablet (25 mg total) by mouth 2 (two) times daily. 04/16/22   Sheikh, Omair Latif, DO  MUCUS RELIEF 600 MG 12 hr tablet Take 600 mg by mouth 2 (two) times daily. 08/07/23   [provider]  Multiple Vitamins-Minerals (PRESERVISION AREDS PO) Take 1 capsule by mouth 2 (two) times daily.    [provider]  nitrofurantoin , macrocrystal-monohydrate, (MACROBID ) 100 MG capsule Take 100 mg by mouth at bedtime.    [provider]  nitroGLYCERIN  (NITROSTAT ) 0.4 MG SL tablet Place 1 tablet (0.4 mg total) under the tongue every 5 (five) minutes as needed for chest pain. Patient taking differently: Place 0.4 mg under the tongue every 5 (five) minutes x 3 doses as needed for chest pain (CALL 9-1-1, IF NO RELIEF). 04/16/22   Sheikh, Omair Latif, DO  nystatin  (MYCOSTATIN /NYSTOP ) powder Apply 1 Application topically daily as needed (for rash/redness).    [provider]   ondansetron  (ZOFRAN ) 4 MG tablet Take 1 tablet (4 mg total) by mouth every 6 (six) hours as needed for nausea. Patient taking differently: Take 4 mg by mouth every 6 (six) hours as needed for nausea or vomiting. 04/16/22   Sheikh, Omair Latif, DO  OVER THE COUNTER MEDICATION Place 1 drop into both eyes See admin instructions. Refresh Classic Eye Drops- Instill 1 drop into both eyes in the morning and at bedtime    [provider]  oxyCODONE  (OXY IR/ROXICODONE ) 5 MG immediate release tablet Take 1 tablet (5 mg total) by mouth every 6 (six) hours as needed for severe pain. Patient taking differently: Take 5 mg by mouth See admin instructions. Take 5 mg by mouth four times a day and hold for sedation 04/16/22   Sheikh, Omair Latif, DO  pantoprazole  (PROTONIX ) 20 MG tablet Take 20 mg by mouth 2 (two) times daily. 02/07/23   [provider]  polyethylene glycol (MIRALAX  / GLYCOLAX ) 17 g packet Take 17 g by mouth daily. 04/16/22   Sheikh, Omair Latif, DO  polyethylene glycol powder (GOODSENSE CLEARLAX) 17 GM/SCOOP powder Take 17 g by mouth daily.    [provider]  potassium chloride  SA (KLOR-CON  M) 20 MEQ tablet Take 20 mEq by mouth daily. 03/20/23   [provider]  REFRESH TEARS 0.5 % SOLN Place 2 drops into both eyes 4 (four) times daily as needed (foir redness or irritation).    [provider]  senna-docusate (SENOKOT-S) 8.6-50 MG tablet Take 1 tablet by mouth at bedtime.    [provider]  traMADol  (ULTRAM ) 50 MG tablet Take 50 mg by mouth every 12 (twelve) hours as needed (for pain). 05/15/23   [provider]    Allergies: Sulfa antibiotics, Codeine, Darvocet [propoxyphene n-acetaminophen ], Nickel, Other, and Vicodin [hydrocodone -acetaminophen ]    Review of Systems  Genitourinary:  Positive for dysuria.  Skin:  Positive for wound.    Updated Vital Signs BP (!) 182/63   Pulse 77   Temp 98.3 F (36.8 C) (Oral)   Resp 16    LMP  (LMP Unknown)   SpO2 98%   Physical Exam Vitals and nursing note reviewed.  Constitutional:      General: She is not in acute distress.    Appearance: She is not toxic-appearing.  HENT:     Head: Normocephalic and atraumatic.  Eyes:     General: No scleral icterus.    Conjunctiva/sclera: Conjunctivae normal.  Cardiovascular:     Rate and Rhythm: Normal rate and regular rhythm.     Pulses: Normal pulses.     Heart sounds: Normal heart sounds.  Pulmonary:     Effort: Pulmonary effort is normal. No respiratory distress.     Breath sounds: Normal breath sounds.  Abdominal:     General: Abdomen is flat. Bowel sounds are normal.     Palpations: Abdomen is soft.  Tenderness: There is abdominal tenderness. There is no right CVA tenderness or left CVA tenderness.     Comments: Lower abdominal TTP  Musculoskeletal:     Right lower leg: No edema.     Left lower leg: No edema.  Skin:    General: Skin is warm and dry.     Findings: Rash present. No lesion.     Comments: Bright red rash under inguinal fold.   Neurological:     General: No focal deficit present.     Mental Status: She is alert and oriented to person, place, and time. Mental status is at baseline.     (all labs ordered are listed, but only abnormal results are displayed) Labs Reviewed  CBC WITH DIFFERENTIAL/PLATELET - Abnormal; Notable for the following components:      Result Value   WBC 12.2 (*)    Neutro Abs 7.9 (*)    Monocytes Absolute 1.4 (*)    All other components within normal limits  URINALYSIS, ROUTINE W REFLEX MICROSCOPIC - Abnormal; Notable for the following components:   APPearance CLOUDY (*)    Specific Gravity, Urine 1.004 (*)    Leukocytes,Ua LARGE (*)    All other components within normal limits  URINE CULTURE  COMPREHENSIVE METABOLIC PANEL WITH GFR  LIPASE, BLOOD    EKG: None  Radiology: CT ABDOMEN PELVIS W CONTRAST Result Date: 02/11/2024 CLINICAL DATA:  Abdominal pain, acute,  nonlocalized Painful urination for weeks.  Low abdominal pain. EXAM: CT ABDOMEN AND PELVIS WITH CONTRAST TECHNIQUE: Multidetector CT imaging of the abdomen and pelvis was performed using the standard protocol following bolus administration of intravenous contrast. RADIATION DOSE REDUCTION: This exam was performed according to the departmental dose-optimization program which includes automated exposure control, adjustment of the mA and/or kV according to patient size and/or use of iterative reconstruction technique. CONTRAST:  OMNIPAQUE  IOHEXOL  300 MG/ML  SOLN COMPARISON:  Abdominopelvic CT 12/15/2023. FINDINGS: Lower chest: Clear lung bases. No significant pleural or pericardial effusion. Aortic atherosclerosis and mitral annular calcifications are noted. Hepatobiliary: The liver is normal in density without suspicious focal abnormality. No evidence of gallstones, gallbladder wall thickening or biliary dilatation. Pancreas: Mild generalized atrophy. No significant pancreatic ductal dilatation or surrounding inflammation. Spleen: Normal in size without focal abnormality. Adrenals/Urinary Tract: Both adrenal glands appear normal. No hydronephrosis, definite urinary tract calculi or perinephric soft tissue stranding. Stable left renal cortical scarring and atrophy. Pelvic floor laxity. Serpiginous hyperenhancement along the bladder neck is likely vascular, although could indicate mucosal hyperenhancement. No other focal bladder abnormalities are identified. Stomach/Bowel: No enteric contrast administered. The stomach appears unremarkable for its degree of distension. No evidence of bowel wall thickening, distention or surrounding inflammatory change. Vascular/Lymphatic: There are no enlarged abdominal or pelvic lymph nodes. Aortic and branch vessel atherosclerosis without evidence of aneurysm or large vessel occlusion. Reproductive: Status post hysterectomy.  No adnexal mass. Other: No evidence of abdominal wall  mass or hernia. No ascites or pneumoperitoneum. Musculoskeletal: No acute or significant osseous findings. Convex right thoracolumbar scoliosis status post multilevel lumbar fusion. IMPRESSION: 1. No definite acute findings or explanation for the patient's symptoms. 2. Serpiginous hyperenhancement along the bladder neck is likely vascular, although could indicate mucosal hyperenhancement. Correlate with urinalysis. 3. Stable left renal cortical scarring and atrophy. 4.  Aortic Atherosclerosis (ICD10-I70.0). Electronically Signed   By: Elsie Perone M.D.   On: 02/11/2024 15:20     Procedures   Medications Ordered in the ED  oxyCODONE -acetaminophen  (PERCOCET/ROXICET) 5-325 MG per  tablet 1 tablet (has no administration in time range)                                    Medical Decision Making Amount and/or Complexity of Data Reviewed Labs: ordered. Radiology: ordered.  Risk Prescription drug management.   This patient presents to the ED for concern of abdominal pain, this involves an extensive number of treatment options, and is a complaint that carries with it a high risk of complications and morbidity.  The differential diagnosis includes cholecystitis, AAA, appendicitis, renal stone, UTI   Co morbidities that complicate the patient evaluation  Frequent urinary tract infection   Lab Tests:  I personally interpreted labs.  The pertinent results include:   Leukocytosis at 12.2 UA positive for leukocytes and large amount of white blood cells.  Will send for culture   Imaging Studies ordered:  I ordered imaging studies including CT abdomen pelvis I independently visualized and interpreted imaging which showed no definitive acute findings but there is hyperenhancement of bladder neck recommending correlate with urinalysis. I agree with the radiologist interpretation   Cardiac Monitoring: / EKG:  The patient was maintained on a cardiac monitor.     Problem List / ED Course  / Critical interventions / Medication management  Patient presents emergency room with complaint of suprapubic pain dysuria frequency and urgency.  On exam she is significant tenderness to ovation of lower abdomen.  I did get CT scan to rule out acute findings.  She has been mild leukocytosis but no fever and vital signs are stable.  Her pain is well-controlled and she is tolerating oral intake here. Also has known rash under abdomen, she is on topical nystatin  and she is having improvement in symptoms.  Given that she is having significant will not make adjustments to this medication.  Discussed patient's urinary tract infection with pharmacy who recommended one-time dose of fosfomycin.  She can then continue her prophylactic dose of Keflex.  Vital signs are stable throughout stay thus feel patient is appropriate for outpatient management.  She has no sign of systemic illness.  Will send for culture and she was given return precautions.      Final diagnoses:  Lower urinary tract infectious disease  Candidiasis    ED Discharge Orders     None          Shermon Warren LOISE DEVONNA 02/11/24 2122    Doretha Folks, MD 02/12/24 1324

## 2024-02-11 NOTE — Discharge Instructions (Signed)
 You were given one time antibiotic dose for UTI called Fosfomycin.   Continue Nystatin  cream for rash.  Recheck symptoms in 3 days with PCP.

## 2024-02-11 NOTE — ED Notes (Signed)
 Pt reports her son is out of the country.  Pt used phone to call her daughter, Holley, to hopefully get her grandson to come and pick her up.  Pt was not able to reach her daughter but did leave a voicemail.

## 2024-02-11 NOTE — ED Triage Notes (Signed)
 BIB EMS from [facility].  Pt has had painful urination x weeks with lower abdominal pain. Reports taking daily meds for UTI's continuously.  Pt states she had a urine test 3 days ago but hasn't heard the results.

## 2024-02-11 NOTE — ED Notes (Signed)
 Pt did not hear back from family.  [facility] transport called and picked up the patient taking her back to [facility].  Precious RN received report.

## 2024-02-12 LAB — URINE CULTURE: Culture: <10000 — AB

## 2024-02-25 ENCOUNTER — Other Ambulatory Visit: Payer: Self-pay

## 2024-02-25 ENCOUNTER — Encounter (HOSPITAL_COMMUNITY): Payer: Self-pay | Admitting: Emergency Medicine

## 2024-02-25 ENCOUNTER — Emergency Department (HOSPITAL_COMMUNITY)
Admission: EM | Admit: 2024-02-25 | Discharge: 2024-02-26 | Disposition: A | Discharge status: Home / Self Care | Attending: Emergency Medicine | Admitting: Emergency Medicine

## 2024-02-25 DIAGNOSIS — Z7982 Long term (current) use of aspirin: Secondary | ICD-10-CM | POA: Diagnosis not present

## 2024-02-25 DIAGNOSIS — N390 Urinary tract infection, site not specified: Secondary | ICD-10-CM | POA: Insufficient documentation

## 2024-02-25 DIAGNOSIS — R309 Painful micturition, unspecified: Secondary | ICD-10-CM | POA: Diagnosis present

## 2024-02-25 DIAGNOSIS — B9689 Other specified bacterial agents as the cause of diseases classified elsewhere: Secondary | ICD-10-CM | POA: Diagnosis not present

## 2024-02-25 LAB — URINALYSIS, ROUTINE W REFLEX MICROSCOPIC
Bilirubin Urine: NEGATIVE
Glucose, UA: NEGATIVE mg/dL
Hgb urine dipstick: NEGATIVE
Ketones, ur: NEGATIVE mg/dL
Nitrite: NEGATIVE
Protein, ur: NEGATIVE mg/dL
Specific Gravity, Urine: 1.004 — ABNORMAL LOW (ref 1.005–1.030)
pH: 6 (ref 5.0–8.0)

## 2024-02-25 LAB — CBC WITH DIFFERENTIAL/PLATELET
Abs Immature Granulocytes: 0.02 K/uL (ref 0.00–0.07)
Basophils Absolute: 0 K/uL (ref 0.0–0.1)
Basophils Relative: 0 %
Eosinophils Absolute: 0 K/uL (ref 0.0–0.5)
Eosinophils Relative: 1 %
HCT: 34.6 % — ABNORMAL LOW (ref 36.0–46.0)
Hemoglobin: 11.7 g/dL — ABNORMAL LOW (ref 12.0–15.0)
Immature Granulocytes: 0 %
Lymphocytes Relative: 44 %
Lymphs Abs: 3.4 K/uL (ref 0.7–4.0)
MCH: 32 pg (ref 26.0–34.0)
MCHC: 33.8 g/dL (ref 30.0–36.0)
MCV: 94.5 fL (ref 80.0–100.0)
Monocytes Absolute: 0.7 K/uL (ref 0.1–1.0)
Monocytes Relative: 9 %
Neutro Abs: 3.6 K/uL (ref 1.7–7.7)
Neutrophils Relative %: 46 %
Platelets: 136 K/uL — ABNORMAL LOW (ref 150–400)
RBC: 3.66 MIL/uL — ABNORMAL LOW (ref 3.87–5.11)
RDW: 15 % (ref 11.5–15.5)
WBC: 7.7 K/uL (ref 4.0–10.5)
nRBC: 0 % (ref 0.0–0.2)

## 2024-02-25 LAB — BASIC METABOLIC PANEL WITH GFR
Anion gap: 12 (ref 5–15)
BUN: 13 mg/dL (ref 8–23)
CO2: 26 mmol/L (ref 22–32)
Calcium: 9.2 mg/dL (ref 8.9–10.3)
Chloride: 101 mmol/L (ref 98–111)
Creatinine, Ser: 0.94 mg/dL (ref 0.44–1.00)
GFR, Estimated: 59 mL/min — ABNORMAL LOW (ref >60)
Glucose, Bld: 95 mg/dL (ref 70–99)
Potassium: 3.8 mmol/L (ref 3.5–5.1)
Sodium: 138 mmol/L (ref 135–145)

## 2024-02-25 MED ORDER — CEPHALEXIN 500 MG PO CAPS
500.0000 mg | ORAL_CAPSULE | Freq: Once | ORAL | Status: AC
  Administered 2024-02-25: 500 mg via ORAL
  Filled 2024-02-25: qty 1

## 2024-02-25 MED ORDER — CEPHALEXIN 500 MG PO CAPS: 500.0000 mg | ORAL_CAPSULE | Freq: Three times a day (TID) | ORAL | 0 refills | Status: AC

## 2024-02-25 NOTE — ED Notes (Signed)
 Lab Called to have urine culture added

## 2024-02-25 NOTE — ED Triage Notes (Signed)
 Patient BIB EMS from [facility]  c/o dysuria and chills. Patient report worsening dysuria even with oral antibiotics that was prescribe 2 weeks ago.  300 LR given by EMS. Patient denies N/V.   BP 143/57 HR 89 RR 20 O2sa5 98 %On RA CBG 114  18L AC

## 2024-02-25 NOTE — ED Notes (Signed)
 RN called [facility] and gave report to Daphnie. [facility] called to have patient transported back to [facility].

## 2024-02-25 NOTE — Discharge Instructions (Addendum)
 Take next dose of antibiotic tomorrow morning.  Return if symptoms worsen.

## 2024-02-25 NOTE — ED Provider Notes (Signed)
 [place] EMERGENCY DEPARTMENT AT [facility] Provider Note   CSN: [id number] Arrival date & time: 02/25/24  1714     Patient presents with: Dysuria   Amber [name] is a 83 y.o. female.   Patient here with painful urination.  No abdominal pain no nausea vomiting diarrhea.  History of chronic UTIs she is on suppressive Keflex.  She denies any fever or chills.  Sounds like she might of had an outpatient urinalysis done that showed possible infection and she was directed to come here for evaluation.  She has had urosepsis in the past.  She denies any nausea vomiting.  Denies any chest pain.  Denies any abdominal pain.  She does pain and frequency of urination.  The history is provided by the patient.       Prior to Admission medications   Medication Sig Start Date End Date Taking? Authorizing Provider  cephALEXin (KEFLEX) 500 MG capsule Take 1 capsule (500 mg total) by mouth 3 (three) times daily for 5 days. 02/25/24 03/01/24 Yes Bren Borys, DO  acetaminophen  (TYLENOL ) 500 MG tablet Take 1,000 mg by mouth 3 (three) times daily.    [provider]  albuterol  (VENTOLIN  HFA) 108 (90 Base) MCG/ACT inhaler Inhale 2 puffs into the lungs every 4 (four) hours as needed for wheezing. 08/07/23   [provider]  amLODipine  (NORVASC ) 5 MG tablet Take 5 mg by mouth daily.    [provider]  aspirin  EC 81 MG tablet Take 1 tablet (81 mg total) by mouth daily. Swallow whole. 04/17/22   Sherrill Cable Latif, DO  atorvastatin  (LIPITOR) 20 MG tablet Take 20 mg by mouth at bedtime.    [provider]  AZO CRANBERRY GUMMIES 250 MG CHEW Chew 1 tablet by mouth daily. 07/30/23   [provider]  B Complex Vitamins (VITAMIN-B COMPLEX) TABS Take 1 tablet by mouth daily.     [provider]  calcium  carbonate (TUMS EX) 750 MG chewable tablet Chew 1 tablet by mouth every 4 (four) hours as needed for heartburn (or indigestion).    [provider]  Cholecalciferol (VITAMIN D3) 1000 units CAPS Take 1,000 Units by mouth daily.     [provider]  clobetasol  ointment (TEMOVATE ) 0.05 % Apply 1 Application topically daily as needed (for eczema, in the evening).    [provider]  Cranberry-Vitamin C-Probiotic (AZO CRANBERRY PO) Take 1 tablet by mouth daily.    [provider]  cyclobenzaprine  (FLEXERIL ) 10 MG tablet Take 1 tablet (10 mg total) by mouth 3 (three) times daily as needed for muscle spasms. Patient taking differently: Take 10 mg by mouth every 8 (eight) hours as needed for muscle spasms. 11/11/20   Jodie Lavern CROME, MD  diclofenac Sodium (VOLTAREN) 1 % GEL Apply 2 g topically 3 (three) times daily as needed (joint pain). 11/14/22   [provider]  furosemide  (LASIX ) 20 MG tablet Take 1 tablet (20 mg total) by mouth daily. 08/18/23 08/17/24  Drusilla Sabas GORMAN, MD  gabapentin  (NEURONTIN ) 100 MG capsule TAKE 1 CAPSULE(100 MG) BY MOUTH THREE TIMES DAILY Patient taking differently: Take 100 mg by mouth 3 (three) times daily. 02/28/21   Jodie Lavern CROME, MD  levothyroxine  (SYNTHROID ) 50 MCG tablet Take 1 tablet (50 mcg total) by mouth daily before breakfast. 11/03/21   Jodie Lavern CROME, MD  metoprolol  tartrate (LOPRESSOR ) 25 MG tablet Take 1 tablet (25 mg total) by mouth 2 (two) times daily. 04/16/22  Sheikh, Omair Latif, DO  MUCUS RELIEF 600 MG 12 hr tablet Take 600 mg by mouth 2 (two) times daily. 08/07/23   [provider]  Multiple Vitamins-Minerals (PRESERVISION AREDS PO) Take 1 capsule by mouth 2 (two) times daily.    [provider]  nitrofurantoin , macrocrystal-monohydrate, (MACROBID ) 100 MG capsule Take 100 mg by mouth at bedtime.    [provider]  nitroGLYCERIN  (NITROSTAT ) 0.4 MG SL tablet Place 1 tablet (0.4 mg total) under the tongue every 5 (five) minutes as needed for chest pain. Patient taking differently: Place 0.4 mg under the tongue every 5 (five) minutes x 3 doses as  needed for chest pain (CALL 9-1-1, IF NO RELIEF). 04/16/22   Sheikh, Omair Latif, DO  nystatin  (MYCOSTATIN /NYSTOP ) powder Apply 1 Application topically daily as needed (for rash/redness).    [provider]  ondansetron  (ZOFRAN ) 4 MG tablet Take 1 tablet (4 mg total) by mouth every 6 (six) hours as needed for nausea. Patient taking differently: Take 4 mg by mouth every 6 (six) hours as needed for nausea or vomiting. 04/16/22   Sheikh, Omair Latif, DO  OVER THE COUNTER MEDICATION Place 1 drop into both eyes See admin instructions. Refresh Classic Eye Drops- Instill 1 drop into both eyes in the morning and at bedtime    [provider]  oxyCODONE  (OXY IR/ROXICODONE ) 5 MG immediate release tablet Take 1 tablet (5 mg total) by mouth every 6 (six) hours as needed for severe pain. Patient taking differently: Take 5 mg by mouth See admin instructions. Take 5 mg by mouth four times a day and hold for sedation 04/16/22   Sheikh, Omair Latif, DO  pantoprazole  (PROTONIX ) 20 MG tablet Take 20 mg by mouth 2 (two) times daily. 02/07/23   [provider]  polyethylene glycol (MIRALAX  / GLYCOLAX ) 17 g packet Take 17 g by mouth daily. 04/16/22   Sheikh, Omair Latif, DO  polyethylene glycol powder (GOODSENSE CLEARLAX) 17 GM/SCOOP powder Take 17 g by mouth daily.    [provider]  potassium chloride  SA (KLOR-CON  M) 20 MEQ tablet Take 20 mEq by mouth daily. 03/20/23   [provider]  REFRESH TEARS 0.5 % SOLN Place 2 drops into both eyes 4 (four) times daily as needed (foir redness or irritation).    [provider]  senna-docusate (SENOKOT-S) 8.6-50 MG tablet Take 1 tablet by mouth at bedtime.    [provider]  traMADol  (ULTRAM ) 50 MG tablet Take 50 mg by mouth every 12 (twelve) hours as needed (for pain). 05/15/23   [provider]    Allergies: Sulfa antibiotics, Codeine, Darvocet [propoxyphene n-acetaminophen ], Nickel, Other, and Vicodin  [hydrocodone -acetaminophen ]    Review of Systems  Updated Vital Signs BP (!) 163/71   Pulse 79   Temp 98 F (36.7 C)   Resp 18   LMP  (LMP Unknown)   SpO2 99%   Physical Exam Vitals and nursing note reviewed.  Constitutional:      General: She is not in acute distress.    Appearance: She is well-developed. She is not ill-appearing.  HENT:     Head: Normocephalic and atraumatic.     Nose: Nose normal.     Mouth/Throat:     Mouth: Mucous membranes are moist.  Eyes:     Extraocular Movements: Extraocular movements intact.     Conjunctiva/sclera: Conjunctivae normal.     Pupils: Pupils are equal, round, and reactive to light.  Cardiovascular:     Rate  and Rhythm: Normal rate and regular rhythm.     Pulses: Normal pulses.     Heart sounds: Normal heart sounds. No murmur heard. Pulmonary:     Effort: Pulmonary effort is normal. No respiratory distress.     Breath sounds: Normal breath sounds.  Abdominal:     General: Abdomen is flat.     Palpations: Abdomen is soft.     Tenderness: There is no abdominal tenderness.  Musculoskeletal:        General: No swelling.     Cervical back: Normal range of motion and neck supple.  Skin:    General: Skin is warm and dry.     Capillary Refill: Capillary refill takes less than 2 seconds.  Neurological:     General: No focal deficit present.     Mental Status: She is alert.  Psychiatric:        Mood and Affect: Mood normal.     (all labs ordered are listed, but only abnormal results are displayed) Labs Reviewed  CBC WITH DIFFERENTIAL/PLATELET - Abnormal; Notable for the following components:      Result Value   RBC 3.66 (*)    Hemoglobin 11.7 (*)    HCT 34.6 (*)    Platelets 136 (*)    All other components within normal limits  BASIC METABOLIC PANEL WITH GFR - Abnormal; Notable for the following components:   GFR, Estimated 59 (*)    All other components within normal limits  URINALYSIS, ROUTINE W REFLEX MICROSCOPIC -  Abnormal; Notable for the following components:   Color, Urine STRAW (*)    Specific Gravity, Urine 1.004 (*)    Leukocytes,Ua MODERATE (*)    Bacteria, UA RARE (*)    All other components within normal limits    EKG: None  Radiology: No results found.   Procedures   Medications Ordered in the ED  cephALEXin (KEFLEX) capsule 500 mg (has no administration in time range)                                    Medical Decision Making Amount and/or Complexity of Data Reviewed Labs: ordered.  Risk Prescription drug management.   Montel GORMAN Graff here with painful urination.  No abdominal pain.  No nausea or vomiting.  She has no fever.  She is well-appearing.  Unremarkable vitals.  History of chronic UTIs.  She is on chronic Keflex.  History of hypertension.  Differential diagnosis likely UTI.  Have no concern for kidney stone or sepsis given her vitals and how well she appears.  Urinalysis does look like possible infection.  Will send urine culture.  Will increase her Keflex to treatment dose.  Lab work otherwise showed no significant leukocytosis anemia or electrolyte abnormality.  Overall patient given prescription for Keflex.  Given her dose here prior to discharge.  Understands return precautions including nausea vomiting severe pain fever or any other acute symptoms.  Discharge.  This chart was dictated using voice recognition software.  Despite best efforts to proofread,  errors can occur which can change the documentation meaning.      Final diagnoses:  Lower urinary tract infectious disease    ED Discharge Orders          Ordered    cephALEXin (KEFLEX) 500 MG capsule  3 times daily        02/25/24 2102  Ruthe Cornet, DO 02/25/24 2108

## 2024-02-27 LAB — URINE CULTURE: Culture: <10000 — AB

## 2024-02-28 ENCOUNTER — Encounter: Payer: Self-pay | Admitting: Internal Medicine

## 2024-02-28 ENCOUNTER — Ambulatory Visit

## 2024-02-28 ENCOUNTER — Ambulatory Visit: Admitting: Internal Medicine

## 2024-02-28 VITALS — BP 158/83 | HR 76 | Temp 97.8°F | Ht 63.0 in | Wt 167.0 lb

## 2024-02-28 DIAGNOSIS — T378X5D Adverse effect of other specified systemic anti-infectives and antiparasitics, subsequent encounter: Secondary | ICD-10-CM | POA: Diagnosis not present

## 2024-02-28 DIAGNOSIS — Z87891 Personal history of nicotine dependence: Secondary | ICD-10-CM

## 2024-02-28 LAB — SEDIMENTATION RATE: Sed Rate: 11 mm/h (ref 0–30)

## 2024-02-28 NOTE — Patient Instructions (Signed)
 Make sure you check your oxygen saturation at your highest level of activity(NOT after you stop)  to be sure it stays over 90% and keep track of it at least once a week, more often if breathing getting worse, and let me know if losing ground. (Collect the dots to connect the dots approach)     Please remember to go to the  x-ray department  for your tests - we will call you with the results when they are available    Please remember to go to the lab department   for your tests - we will call you with the results when they are available.      Keep the appt with Mannam for November 2025

## 2024-02-28 NOTE — Progress Notes (Signed)
 Amber [name], female    DOB: 1940/07/04   MRN: [id number]   Brief patient profile:  22   yowf quit smoking 1966  Assisted living pt/ self referred to pulmonary clinic 02/28/2024  doe ? Macrodantin    related    CT abdomen pelvis 03/12/2023-visualized lungs are clear CTA 08/14/2023-negative for PE, diffuse bilateral subpleural thickening, patchy groundglass opacities High resolution CT 12/29/2023-diffuse ground glass with mild basilar scarring, lobular F trapping, alternate pattern.  Suspect hypersensitivity pneumonitis     PFTs: 01/08/2024 FVC 2.12 [107%], FEV1 1.62 [112%], F/F 0.76, TLC 3.45 [76%], DLCO 7.96 [48%] Mild restriction, severe diffusion defect   Labs: CTD serologies 08/18/2023-negative   Cardiac: Echocardiogram 08/15/2023 LVEF 60 to 65%, grade 1 diastolic dysfunction, moderate pulmonary hypertension, TAPSE 2.9 cm    History of Present Illness  02/28/2024  Pulmonary/ 1st office eval/Annelise Mccoy Macodantin stopped around 4 months prior to OV   2 months  prior took course of Prednisone  maybe lost a little ground off it with arthritic cc's improved on it for sure   Dyspnea:  getting dressed but not walking halls with rollator (limited by back> sob)  Cough: none  Sleep: about 45 degrees  SABA use: none  02 ldz:[id number]     No obvious day to day or daytime pattern/variability or assoc excess/ purulent sputum or mucus plugs or hemoptysis or cp or chest tightness, subjective wheeze or overt sinus or hb symptoms.    Also denies any obvious fluctuation of symptoms with weather or environmental changes or other aggravating or alleviating factors except as outlined above   No unusual exposure hx or h/o childhood pna/ asthma or knowledge of premature birth.  Current Allergies, Complete Past Medical History, Past Surgical History, Family History, and Social History were reviewed in [employer] record.  ROS  The following are not active complaints unless  bolded Hoarseness, sore throat, dysphagia, dental problems, itching, sneezing,  nasal congestion or discharge of excess mucus or purulent secretions, ear ache,   fever, chills, sweats, unintended wt loss or wt gain, classically pleuritic or exertional cp,  orthopnea pnd or arm/hand swelling  or leg swelling, presyncope, palpitations, abdominal pain, anorexia, nausea, vomiting, diarrhea  or change in bowel habits or change in bladder habits, change in stools or change in urine, dysuria, hematuria,  rash, arthralgias, visual complaints, headache, numbness, weakness or ataxia or problems with walking or coordination,  change in mood or  memory.             Outpatient Medications Prior to Visit  Medication Sig Dispense Refill   acetaminophen  (TYLENOL ) 500 MG tablet Take 1,000 mg by mouth 3 (three) times daily.     albuterol  (VENTOLIN  HFA) 108 (90 Base) MCG/ACT inhaler Inhale 2 puffs into the lungs every 4 (four) hours as needed for wheezing.     amLODipine  (NORVASC ) 5 MG tablet Take 5 mg by mouth daily.     aspirin  EC 81 MG tablet Take 1 tablet (81 mg total) by mouth daily. Swallow whole. 30 tablet 12   atorvastatin  (LIPITOR) 20 MG tablet Take 20 mg by mouth at bedtime.     AZO CRANBERRY GUMMIES 250 MG CHEW Chew 1 tablet by mouth daily.     B Complex Vitamins (VITAMIN-B COMPLEX) TABS Take 1 tablet by mouth daily.      calcium  carbonate (TUMS EX) 750 MG chewable tablet Chew 1 tablet by mouth every 4 (four) hours as needed for heartburn (or indigestion).  cephALEXin (KEFLEX) 500 MG capsule Take 1 capsule (500 mg total) by mouth 3 (three) times daily for 5 days. 15 capsule 0   Cholecalciferol (VITAMIN D3) 1000 units CAPS Take 1,000 Units by mouth daily.      clobetasol  ointment (TEMOVATE ) 0.05 % Apply 1 Application topically daily as needed (for eczema, in the evening).     Cranberry-Vitamin C-Probiotic (AZO CRANBERRY PO) Take 1 tablet by mouth daily.     cyclobenzaprine  (FLEXERIL ) 10 MG tablet Take  1 tablet (10 mg total) by mouth 3 (three) times daily as needed for muscle spasms. (Patient taking differently: Take 10 mg by mouth every 8 (eight) hours as needed for muscle spasms.) 30 tablet 0   diclofenac Sodium (VOLTAREN) 1 % GEL Apply 2 g topically 3 (three) times daily as needed (joint pain).     furosemide  (LASIX ) 20 MG tablet Take 1 tablet (20 mg total) by mouth daily. 30 tablet 3   gabapentin  (NEURONTIN ) 100 MG capsule TAKE 1 CAPSULE(100 MG) BY MOUTH THREE TIMES DAILY (Patient taking differently: Take 100 mg by mouth 3 (three) times daily.) 90 capsule 1   levothyroxine  (SYNTHROID ) 50 MCG tablet Take 1 tablet (50 mcg total) by mouth daily before breakfast. 90 tablet 3   metoprolol  tartrate (LOPRESSOR ) 25 MG tablet Take 1 tablet (25 mg total) by mouth 2 (two) times daily. 60 tablet 0   MUCUS RELIEF 600 MG 12 hr tablet Take 600 mg by mouth 2 (two) times daily.     Multiple Vitamins-Minerals (PRESERVISION AREDS PO) Take 1 capsule by mouth 2 (two) times daily.     nitrofurantoin , macrocrystal-monohydrate, (MACROBID ) 100 MG capsule Take 100 mg by mouth at bedtime.     nitroGLYCERIN  (NITROSTAT ) 0.4 MG SL tablet Place 1 tablet (0.4 mg total) under the tongue every 5 (five) minutes as needed for chest pain. (Patient taking differently: Place 0.4 mg under the tongue every 5 (five) minutes x 3 doses as needed for chest pain (CALL 9-1-1, IF NO RELIEF).) 30 tablet 12   nystatin  (MYCOSTATIN /NYSTOP ) powder Apply 1 Application topically daily as needed (for rash/redness).     ondansetron  (ZOFRAN ) 4 MG tablet Take 1 tablet (4 mg total) by mouth every 6 (six) hours as needed for nausea. (Patient taking differently: Take 4 mg by mouth every 6 (six) hours as needed for nausea or vomiting.) 20 tablet 0   OVER THE COUNTER MEDICATION Place 1 drop into both eyes See admin instructions. Refresh Classic Eye Drops- Instill 1 drop into both eyes in the morning and at bedtime     oxyCODONE  (OXY IR/ROXICODONE ) 5 MG  immediate release tablet Take 1 tablet (5 mg total) by mouth every 6 (six) hours as needed for severe pain. (Patient taking differently: Take 5 mg by mouth See admin instructions. Take 5 mg by mouth four times a day and hold for sedation) 10 tablet 0   pantoprazole  (PROTONIX ) 20 MG tablet Take 20 mg by mouth 2 (two) times daily.     polyethylene glycol (MIRALAX  / GLYCOLAX ) 17 g packet Take 17 g by mouth daily. 14 each 0   polyethylene glycol powder (GOODSENSE CLEARLAX) 17 GM/SCOOP powder Take 17 g by mouth daily.     potassium chloride  SA (KLOR-CON  M) 20 MEQ tablet Take 20 mEq by mouth daily.     REFRESH TEARS 0.5 % SOLN Place 2 drops into both eyes 4 (four) times daily as needed (foir redness or irritation).     senna-docusate (SENOKOT-S) 8.6-50 MG tablet  Take 1 tablet by mouth at bedtime.     traMADol  (ULTRAM ) 50 MG tablet Take 50 mg by mouth every 12 (twelve) hours as needed (for pain).     No facility-administered medications prior to visit.    Past Medical History:  Diagnosis Date   Arthritis    Coronary artery disease    DVT (deep venous thrombosis) (HCC)    Family history of blood clots    Hx of blood clots    Hypertension    Hypothyroidism    Left bundle branch block    Macular degeneration    Peripheral vascular disease    Pneumonia    Post-phlebitic syndrome 04/18/2013   Rotator cuff rupture, complete 07/05/2011   Varicose veins       Objective:     BP (!) 158/83   Pulse 76   Temp 97.8 F (36.6 C) (Oral)   Ht 5' 3 (1.6 m)   Wt 167 lb (75.8 kg)   LMP  (LMP Unknown)   SpO2 97%   BMI 29.58 kg/m   SpO2: 97 %  RA amb with 2 wheeled walker/well kempt elderly wf nad    HEENT : Oropharynx  clear      Nasal turbinates nkl   NECK :  without  apparent JVD/ palpable Nodes/TM    LUNGS: no acc muscle use,  Nl contour chest which is clear to A and P bilaterally without cough on insp or exp maneuvers   CV:  RRR  no s3 or murmur or increase in P2, and no edema    ABD:  soft and nontender   MS:   ext warm without deformities Or obvious joint restrictions  calf tenderness, cyanosis or clubbing    SKIN: warm and dry without lesions    NEURO:  alert, approp, nl sensorium with  no motor or cerebellar deficits apparent.     CXR PA and Lateral:   02/28/2024 :    I personally reviewed images and impression is as follows:     Slightly less prominent ILD       Lab Results  Component Value Date   ESRSEDRATE 11 02/28/2024   ESRSEDRATE 9 06/05/2022    Assessment   Assessment & Plan Adverse reaction to nitrofurantoin , subsequent encounter Macrodantin  d/c around May 2025 ? -  02/28/2024  ESR 11  with ? Slt improvement in cxr and presently limited by back > breathing with no indication for more prednisone  at this poiint   Recs >>>> continue to monitor sats at peak exertion  >>>> f/u with Dr Theophilus in November as planned   Each maintenance medication was reviewed in detail including emphasizing most importantly the difference between maintenance and prns and under what circumstances the prns are to be triggered using an action plan format where appropriate.  Total time for H and P, chart review, counseling, reviewing pulse ox  device(s) and generating customized AVS unique to this office visit / same day charting = 42 min with complex pt new to me         AVS  Patient Instructions  Make sure you check your oxygen saturation at your highest level of activity(NOT after you stop)  to be sure it stays over 90% and keep track of it at least once a week, more often if breathing getting worse, and let me know if losing ground. (Collect the dots to connect the dots approach)     Please remember to go to the  x-ray  department  for your tests - we will call you with the results when they are available    Please remember to go to the lab department   for your tests - we will call you with the results when they are available.      Keep the appt with  Mannam for November 2025    Ozell America, MD 03/01/2024

## 2024-02-29 ENCOUNTER — Ambulatory Visit: Payer: Self-pay | Admitting: Internal Medicine

## 2024-03-01 NOTE — Assessment & Plan Note (Signed)
 Macrodantin  d/c around May 2025 ? -  02/28/2024  ESR 11  with ? Slt improvement in cxr and presently limited by back > breathing with no indication for more prednisone  at this poiint   Recs >>>> continue to monitor sats at peak exertion  >>>> f/u with Dr Theophilus in November as planned   Each maintenance medication was reviewed in detail including emphasizing most importantly the difference between maintenance and prns and under what circumstances the prns are to be triggered using an action plan format where appropriate.  Total time for H and P, chart review, counseling, reviewing pulse ox  device(s) and generating customized AVS unique to this office visit / same day charting = 42 min with complex pt new to me

## 2024-03-05 NOTE — Progress Notes (Signed)
 Spoke with pt regarding her results , pt confirmed understanding

## 2024-04-11 ENCOUNTER — Ambulatory Visit: Admitting: Pulmonary Disease

## 2024-04-11 ENCOUNTER — Encounter: Payer: Self-pay | Admitting: Pulmonary Disease

## 2024-04-11 VITALS — BP 138/75 | HR 65 | Temp 98.1°F | Ht 60.0 in | Wt 169.0 lb

## 2024-04-11 DIAGNOSIS — J849 Interstitial pulmonary disease, unspecified: Secondary | ICD-10-CM

## 2024-04-11 NOTE — Progress Notes (Signed)
 Amber [name]    [id number]    18-Aug-1940  Primary Care Physician:[facility], [facility]  Referring Physician: [facility], [facility] [address] [place],  [place] [address]  Chief complaint: Follow up for interstitial lung disease, nitrofurantoin  toxicity  HPI: 83 y.o. who  has a past medical history of Arthritis, Coronary artery disease, DVT (deep venous thrombosis) (HCC), Family history of blood clots, blood clots, Hypertension, Hypothyroidism, Left bundle branch block, Macular degeneration, Peripheral vascular disease, Pneumonia, Post-phlebitic syndrome (04/18/2013), Rotator cuff rupture, complete (07/05/2011), and Varicose veins.  Discussed the use of AI scribe software for clinical note transcription with the patient, who gave verbal consent to proceed.  History of Present Illness Amber [name] is an 83 year old female with heart failure who presents with shortness of breath.  Seen by pulmonary during hospitalization in March 2025 for acute respiratory failure, where she was treated for heart failure and volume overload. A CT scan at that time showed diffuse groundglass opacity, but no blood clots were found. Tests for autoimmune conditions like lupus and rheumatoid arthritis were negative.  She was discharged with oxygen therapy, which she did not need for long. Her O2 sats has remained in the nineties, occasionally starting at ninety-one and then increasing.  She has a history of chronic urinary tract infections and has been on nitrofurantoin  (Macrobid ) since 2023 following an episode of sepsis.  Nitrofurantoin  was stopped in August 2025 and switched to cephalexin due to concern for interstitial lung disease.  She was given prednisone  at 10 mg/day for 1 month in August 2025.  Higher dose ordered due to concern for side effects.  I printed for her  There is a concern that this medication might cause lung inflammation, although her CT scan findings are not  typical for this.  Interim History Amber [name] is an 83 year old female with interstitial lung disease who presents for follow-up after discontinuation of nitrofurantoin .  Dyspnea and pulmonary function - Shortness of breath occurs during morning activities - Breathing remains stable overall - Oxygen saturation remains above 95% during episodes of dyspnea  Radiographic findings - CT scan in August 2025 demonstrated infiltrates and inflammation  Medication history and changes - Nitrofurantoin  discontinued on January 08, 2024 due to suspected drug-induced interstitial lung disease.  Discussed management of chronic UTI in detail with Dr. Gaston  her urologist - Cephalexin initiated following discontinuation of nitrofurantoin   Relevant pulmonary history Pets: No pets Occupation: Retired manufacturing systems engineer Exposures: No mold, hot tub, Jacuzzi.  No feather pillows or comforters Smoking history: Quit smoking at the age of 1 Travel history:She has lived in [place]  since 1979, originally from [place] .  No significant recent travel Relevant family history:Her father had black lung disease from working in the mines, but there is no other family history of lung disease.   Outpatient Encounter Medications as of 04/11/2024  Medication Sig   acetaminophen  (TYLENOL ) 500 MG tablet Take 1,000 mg by mouth 3 (three) times daily.   albuterol  (VENTOLIN  HFA) 108 (90 Base) MCG/ACT inhaler Inhale 2 puffs into the lungs every 4 (four) hours as needed for wheezing.   amLODipine  (NORVASC ) 5 MG tablet Take 5 mg by mouth daily.   aspirin  EC 81 MG tablet Take 1 tablet (81 mg total) by mouth daily. Swallow whole.   atorvastatin  (LIPITOR) 20 MG tablet Take 20 mg by mouth at bedtime.   AZO CRANBERRY GUMMIES 250 MG CHEW Chew  1 tablet by mouth daily.   B Complex Vitamins (VITAMIN-B COMPLEX) TABS Take 1 tablet by mouth daily.    calcium  carbonate (TUMS EX) 750 MG chewable tablet Chew 1 tablet by  mouth every 4 (four) hours as needed for heartburn (or indigestion).   Cholecalciferol (VITAMIN D3) 1000 units CAPS Take 1,000 Units by mouth daily.    clobetasol  ointment (TEMOVATE ) 0.05 % Apply 1 Application topically daily as needed (for eczema, in the evening).   Cranberry-Vitamin C-Probiotic (AZO CRANBERRY PO) Take 1 tablet by mouth daily.   cyclobenzaprine  (FLEXERIL ) 10 MG tablet Take 1 tablet (10 mg total) by mouth 3 (three) times daily as needed for muscle spasms.   diclofenac Sodium (VOLTAREN) 1 % GEL Apply 2 g topically 3 (three) times daily as needed (joint pain).   furosemide  (LASIX ) 20 MG tablet Take 1 tablet (20 mg total) by mouth daily.   gabapentin  (NEURONTIN ) 100 MG capsule TAKE 1 CAPSULE(100 MG) BY MOUTH THREE TIMES DAILY   levothyroxine  (SYNTHROID ) 50 MCG tablet Take 1 tablet (50 mcg total) by mouth daily before breakfast.   metoprolol  tartrate (LOPRESSOR ) 25 MG tablet Take 1 tablet (25 mg total) by mouth 2 (two) times daily.   MUCUS RELIEF 600 MG 12 hr tablet Take 600 mg by mouth 2 (two) times daily.   Multiple Vitamins-Minerals (PRESERVISION AREDS PO) Take 1 capsule by mouth 2 (two) times daily.   nitrofurantoin , macrocrystal-monohydrate, (MACROBID ) 100 MG capsule Take 100 mg by mouth at bedtime.   nitroGLYCERIN  (NITROSTAT ) 0.4 MG SL tablet Place 1 tablet (0.4 mg total) under the tongue every 5 (five) minutes as needed for chest pain.   nystatin  (MYCOSTATIN /NYSTOP ) powder Apply 1 Application topically daily as needed (for rash/redness).   ondansetron  (ZOFRAN ) 4 MG tablet Take 1 tablet (4 mg total) by mouth every 6 (six) hours as needed for nausea.   OVER THE COUNTER MEDICATION Place 1 drop into both eyes See admin instructions. Refresh Classic Eye Drops- Instill 1 drop into both eyes in the morning and at bedtime   oxyCODONE  (OXY IR/ROXICODONE ) 5 MG immediate release tablet Take 1 tablet (5 mg total) by mouth every 6 (six) hours as needed for severe pain.   pantoprazole   (PROTONIX ) 20 MG tablet Take 20 mg by mouth 2 (two) times daily.   polyethylene glycol (MIRALAX  / GLYCOLAX ) 17 g packet Take 17 g by mouth daily.   polyethylene glycol powder (GOODSENSE CLEARLAX) 17 GM/SCOOP powder Take 17 g by mouth daily.   potassium chloride  SA (KLOR-CON  M) 20 MEQ tablet Take 20 mEq by mouth daily.   REFRESH TEARS 0.5 % SOLN Place 2 drops into both eyes 4 (four) times daily as needed (foir redness or irritation).   senna-docusate (SENOKOT-S) 8.6-50 MG tablet Take 1 tablet by mouth at bedtime.   traMADol  (ULTRAM ) 50 MG tablet Take 50 mg by mouth every 12 (twelve) hours as needed (for pain).   No facility-administered encounter medications on file as of 04/11/2024.   Vitals:   04/11/24 1000  BP: 138/75  Pulse: 65  Temp: 98.1 F (36.7 C)  Height: 5' (1.524 m)  Weight: 169 lb (76.7 kg)  SpO2: 96%  TempSrc: Oral  BMI (Calculated): 33.01     Physical Exam GEN: No acute distress. CV: Regular rate and rhythm, no murmurs. LUNGS: Clear to auscultation bilaterally, normal respiratory effort. SKIN JOINTS: Warm and dry, no rash.    Data Reviewed: Imaging: CT abdomen pelvis 03/12/2023-visualized lungs are clear CTA 08/14/2023-negative for PE, diffuse  bilateral subpleural thickening, patchy groundglass opacities High resolution CT 12/29/2023-diffuse ground glass with mild basilar scarring, lobular F trapping, alternate pattern.  Suspect hypersensitivity pneumonitis I have reviewed the images personally.  PFTs: 01/08/2024 FVC 2.12 [107%], FEV1 1.62 [112%], F/F76, TLC 3.45 [76%], DLCO 7.96 [48%] Mild restriction, severe diffusion defect  Labs: CTD serologies 08/18/2023-negative  Cardiac: Echocardiogram 08/15/2023 LVEF 60 to 65%, grade 1 diastolic dysfunction, moderate pulmonary hypertension, TAPSE 2.9 cm Assessment & Plan Interstitial lung disease due to nitrofurantoin  Interstitial lung disease likely secondary to nitrofurantoin  use, with previous ground glass opacities  and inflammation. Currently off nitrofurantoin  since August 12th, 2025. Reports shortness of breath in the morning, but oxygen saturation remains above 95%. No current respiratory distress on examination. - Ordered chest CT to assess for resolution of inflammation and ground glass opacities. - Will consider reinitiating prednisone  if inflammation persists. - Will evaluate for other potential causes if inflammation is not resolved.  Pulmonary hypertension History of heart failure with recovered ejection fraction Previously reduced ejection fraction now recovered to 60%. Heart function improved since March hospitalization. Continued monitoring necessary. Pulmonary hypertension present. Improvement in heart function and lung function may improve pulmonary hypertension. - Consider repeating echocardiogram next year.  I personally spent a total of 30 minutes in the care of the patient today including preparing to see the patient, getting/reviewing separately obtained history, performing a medically appropriate exam/evaluation, counseling and educating, placing orders, referring and communicating with other health care professionals, documenting clinical information in the EHR, independently interpreting results, and communicating results.   Recommendations: Follow-up high-res CT  Lonna Coder MD [facility] 04/11/2024, 10:07 AM  CC: [place]

## 2024-04-11 NOTE — Patient Instructions (Signed)
  VISIT SUMMARY: You had a follow-up visit to check on your interstitial lung disease after stopping nitrofurantoin . Your breathing is stable, and your oxygen levels are good, but you still experience some shortness of breath in the mornings.  YOUR PLAN: INTERSTITIAL LUNG DISEASE DUE TO NITROFURANTOIN : Your interstitial lung disease is likely caused by the medication nitrofurantoin , which you stopped taking in August. You still have some shortness of breath, but your oxygen levels are good. -A chest CT scan has been ordered to check if the inflammation and ground glass opacities in your lungs have improved. -If the inflammation is still present, we may consider starting you on prednisone  again. -If the inflammation does not improve, we will look into other possible causes.  PULMONARY SCARRING: You have scarring in your lungs, possibly from past pneumonia or other infections. This scarring may not completely go away. -We will continue to monitor the scarring in your lungs with chest CT scans.

## 2024-04-17 ENCOUNTER — Ambulatory Visit
Admission: RE | Admit: 2024-04-17 | Discharge: 2024-04-17 | Disposition: A | Source: Ambulatory Visit | Attending: Pulmonary Disease

## 2024-04-17 DIAGNOSIS — J849 Interstitial pulmonary disease, unspecified: Secondary | ICD-10-CM

## 2024-04-22 ENCOUNTER — Ambulatory Visit: Payer: Self-pay | Admitting: Pulmonary Disease

## 2024-06-17 ENCOUNTER — Other Ambulatory Visit: Payer: Self-pay | Admitting: Internal Medicine
